# Patient Record
Sex: Male | Born: 1960
Health system: Southern US, Community
[De-identification: ages and names within clinical notes are randomized; demographics above are authoritative.]

## PROBLEM LIST (undated history)

## (undated) DIAGNOSIS — M1990 Unspecified osteoarthritis, unspecified site: Secondary | ICD-10-CM

## (undated) DIAGNOSIS — N182 Chronic kidney disease, stage 2 (mild): Secondary | ICD-10-CM

## (undated) DIAGNOSIS — R001 Bradycardia, unspecified: Secondary | ICD-10-CM

## (undated) DIAGNOSIS — J189 Pneumonia, unspecified organism: Secondary | ICD-10-CM

## (undated) DIAGNOSIS — F329 Major depressive disorder, single episode, unspecified: Secondary | ICD-10-CM

## (undated) DIAGNOSIS — E785 Hyperlipidemia, unspecified: Secondary | ICD-10-CM

## (undated) DIAGNOSIS — I10 Essential (primary) hypertension: Secondary | ICD-10-CM

## (undated) DIAGNOSIS — I255 Ischemic cardiomyopathy: Secondary | ICD-10-CM

## (undated) DIAGNOSIS — I513 Intracardiac thrombosis, not elsewhere classified: Secondary | ICD-10-CM

## (undated) DIAGNOSIS — F419 Anxiety disorder, unspecified: Secondary | ICD-10-CM

## (undated) DIAGNOSIS — F32A Depression, unspecified: Secondary | ICD-10-CM

## (undated) DIAGNOSIS — I2510 Atherosclerotic heart disease of native coronary artery without angina pectoris: Secondary | ICD-10-CM

## (undated) DIAGNOSIS — D735 Infarction of spleen: Secondary | ICD-10-CM

## (undated) DIAGNOSIS — G4733 Obstructive sleep apnea (adult) (pediatric): Secondary | ICD-10-CM

## (undated) DIAGNOSIS — I219 Acute myocardial infarction, unspecified: Secondary | ICD-10-CM

## (undated) HISTORY — DX: Essential (primary) hypertension: I10

## (undated) HISTORY — DX: Intracardiac thrombosis, not elsewhere classified: I51.3

## (undated) HISTORY — DX: Bradycardia, unspecified: R00.1

## (undated) HISTORY — DX: Obstructive sleep apnea (adult) (pediatric): G47.33

## (undated) HISTORY — DX: Chronic kidney disease, stage 2 (mild): N18.2

## (undated) HISTORY — PX: CORONARY ANGIOPLASTY WITH STENT PLACEMENT: SHX49

## (undated) HISTORY — DX: Hyperlipidemia, unspecified: E78.5

## (undated) HISTORY — DX: Ischemic cardiomyopathy: I25.5

## (undated) HISTORY — DX: Infarction of spleen: D73.5

## (undated) HISTORY — DX: Acute myocardial infarction, unspecified: I21.9

## (undated) HISTORY — PX: EXPLORATORY LAPAROTOMY: SUR591

## (undated) HISTORY — DX: Atherosclerotic heart disease of native coronary artery without angina pectoris: I25.10

---

## 1991-05-02 HISTORY — PX: ARTHROPLASTY: SHX135

## 2002-12-08 ENCOUNTER — Encounter: Admission: RE | Admit: 2002-12-08 | Discharge: 2002-12-08 | Payer: Self-pay | Admitting: Family Medicine

## 2002-12-08 ENCOUNTER — Encounter: Payer: Self-pay | Admitting: Family Medicine

## 2003-11-29 ENCOUNTER — Ambulatory Visit (HOSPITAL_COMMUNITY): Admission: RE | Admit: 2003-11-29 | Discharge: 2003-11-29 | Payer: Self-pay | Admitting: Family Medicine

## 2007-03-02 DIAGNOSIS — I219 Acute myocardial infarction, unspecified: Secondary | ICD-10-CM

## 2007-03-02 HISTORY — DX: Acute myocardial infarction, unspecified: I21.9

## 2007-03-25 ENCOUNTER — Ambulatory Visit: Payer: Self-pay | Admitting: Cardiology

## 2007-03-25 ENCOUNTER — Inpatient Hospital Stay (HOSPITAL_COMMUNITY): Admission: EM | Admit: 2007-03-25 | Discharge: 2007-03-29 | Payer: Self-pay | Admitting: Emergency Medicine

## 2007-04-01 ENCOUNTER — Ambulatory Visit: Payer: Self-pay | Admitting: Cardiology

## 2007-04-05 ENCOUNTER — Ambulatory Visit: Payer: Self-pay | Admitting: Cardiology

## 2007-04-05 LAB — CONVERTED CEMR LAB
BUN: 15 mg/dL (ref 6–23)
CO2: 32 meq/L (ref 19–32)
Calcium: 9.3 mg/dL (ref 8.4–10.5)
Chloride: 103 meq/L (ref 96–112)
Creatinine, Ser: 1.4 mg/dL (ref 0.4–1.5)
GFR calc Af Amer: 71 mL/min
GFR calc non Af Amer: 59 mL/min
Glucose, Bld: 108 mg/dL — ABNORMAL HIGH (ref 70–99)
Potassium: 4 meq/L (ref 3.5–5.1)
Sodium: 142 meq/L (ref 135–145)

## 2007-04-23 ENCOUNTER — Ambulatory Visit: Payer: Self-pay | Admitting: Cardiology

## 2007-04-23 ENCOUNTER — Ambulatory Visit: Payer: Self-pay

## 2007-04-23 LAB — CONVERTED CEMR LAB
BUN: 16 mg/dL (ref 6–23)
CO2: 32 meq/L (ref 19–32)
Calcium: 9.2 mg/dL (ref 8.4–10.5)
Chloride: 107 meq/L (ref 96–112)
Creatinine, Ser: 1.2 mg/dL (ref 0.4–1.5)
GFR calc Af Amer: 85 mL/min
GFR calc non Af Amer: 70 mL/min
Glucose, Bld: 74 mg/dL (ref 70–99)
Potassium: 3.8 meq/L (ref 3.5–5.1)
Sodium: 144 meq/L (ref 135–145)

## 2007-05-03 ENCOUNTER — Encounter: Payer: Self-pay | Admitting: Cardiology

## 2007-05-03 ENCOUNTER — Encounter (HOSPITAL_COMMUNITY): Admission: RE | Admit: 2007-05-03 | Discharge: 2007-06-29 | Payer: Self-pay | Admitting: Cardiology

## 2007-05-03 ENCOUNTER — Ambulatory Visit: Payer: Self-pay | Admitting: Cardiology

## 2007-05-03 ENCOUNTER — Ambulatory Visit: Payer: Self-pay

## 2007-05-03 LAB — CONVERTED CEMR LAB
ALT: 51 units/L (ref 0–53)
AST: 33 units/L (ref 0–37)
Albumin: 3.5 g/dL (ref 3.5–5.2)
Alkaline Phosphatase: 88 units/L (ref 39–117)
Bilirubin, Direct: 0.1 mg/dL (ref 0.0–0.3)
Cholesterol: 103 mg/dL (ref 0–200)
HDL: 30.1 mg/dL — ABNORMAL LOW (ref >39.0)
LDL Cholesterol: 58 mg/dL (ref 0–99)
Total Bilirubin: 0.5 mg/dL (ref 0.3–1.2)
Total CHOL/HDL Ratio: 3.4
Total Protein: 6.7 g/dL (ref 6.0–8.3)
Triglycerides: 73 mg/dL (ref 0–149)
VLDL: 15 mg/dL (ref 0–40)

## 2007-06-03 ENCOUNTER — Ambulatory Visit: Payer: Self-pay | Admitting: Cardiology

## 2007-06-03 LAB — CONVERTED CEMR LAB
BUN: 16 mg/dL (ref 6–23)
CO2: 33 meq/L — ABNORMAL HIGH (ref 19–32)
Calcium: 9.3 mg/dL (ref 8.4–10.5)
Chloride: 102 meq/L (ref 96–112)
Creatinine, Ser: 1.2 mg/dL (ref 0.4–1.5)
Direct LDL: 59.9 mg/dL
GFR calc Af Amer: 85 mL/min
GFR calc non Af Amer: 70 mL/min
Glucose, Bld: 87 mg/dL (ref 70–99)
Magnesium: 2.1 mg/dL (ref 1.5–2.5)
Potassium: 3.4 meq/L — ABNORMAL LOW (ref 3.5–5.1)
Sodium: 141 meq/L (ref 135–145)
Total CK: 232 units/L (ref 7–195)

## 2007-06-06 ENCOUNTER — Ambulatory Visit: Payer: Self-pay | Admitting: Cardiology

## 2007-06-20 ENCOUNTER — Ambulatory Visit: Payer: Self-pay | Admitting: Internal Medicine

## 2007-07-02 ENCOUNTER — Ambulatory Visit: Payer: Self-pay | Admitting: Cardiology

## 2007-07-02 LAB — CONVERTED CEMR LAB
BUN: 19 mg/dL (ref 6–23)
CO2: 30 meq/L (ref 19–32)
Calcium: 9.3 mg/dL (ref 8.4–10.5)
Chloride: 108 meq/L (ref 96–112)
Creatinine, Ser: 1.3 mg/dL (ref 0.4–1.5)
GFR calc Af Amer: 77 mL/min
GFR calc non Af Amer: 64 mL/min
Glucose, Bld: 75 mg/dL (ref 70–99)
Potassium: 4.3 meq/L (ref 3.5–5.1)
Sodium: 142 meq/L (ref 135–145)
Total CK: 220 units/L (ref 7–195)

## 2007-07-10 ENCOUNTER — Ambulatory Visit: Payer: Self-pay | Admitting: Cardiology

## 2007-07-19 ENCOUNTER — Ambulatory Visit (HOSPITAL_BASED_OUTPATIENT_CLINIC_OR_DEPARTMENT_OTHER): Admission: RE | Admit: 2007-07-19 | Discharge: 2007-07-19 | Payer: Self-pay | Admitting: Cardiology

## 2007-07-19 ENCOUNTER — Ambulatory Visit: Payer: Self-pay | Admitting: Cardiovascular Disease

## 2007-07-19 ENCOUNTER — Encounter: Payer: Self-pay | Admitting: Pulmonary Disease

## 2007-08-02 ENCOUNTER — Ambulatory Visit: Payer: Self-pay | Admitting: Cardiology

## 2007-08-02 LAB — CONVERTED CEMR LAB
ALT: 42 units/L (ref 0–53)
AST: 30 units/L (ref 0–37)
Albumin: 3.7 g/dL (ref 3.5–5.2)
Alkaline Phosphatase: 66 units/L (ref 39–117)
BUN: 19 mg/dL (ref 6–23)
Bilirubin, Direct: 0.1 mg/dL (ref 0.0–0.3)
CO2: 29 meq/L (ref 19–32)
Calcium: 9 mg/dL (ref 8.4–10.5)
Chloride: 109 meq/L (ref 96–112)
Cholesterol: 139 mg/dL (ref 0–200)
Creatinine, Ser: 1.3 mg/dL (ref 0.4–1.5)
GFR calc Af Amer: 77 mL/min
GFR calc non Af Amer: 64 mL/min
Glucose, Bld: 87 mg/dL (ref 70–99)
HDL: 33.2 mg/dL — ABNORMAL LOW (ref >39.0)
LDL Cholesterol: 94 mg/dL (ref 0–99)
Potassium: 3.8 meq/L (ref 3.5–5.1)
Sodium: 145 meq/L (ref 135–145)
Total Bilirubin: 0.7 mg/dL (ref 0.3–1.2)
Total CHOL/HDL Ratio: 4.2
Total Protein: 6.9 g/dL (ref 6.0–8.3)
Triglycerides: 59 mg/dL (ref 0–149)
VLDL: 12 mg/dL (ref 0–40)

## 2007-08-06 ENCOUNTER — Ambulatory Visit: Payer: Self-pay | Admitting: Pulmonary Disease

## 2007-08-19 DIAGNOSIS — I219 Acute myocardial infarction, unspecified: Secondary | ICD-10-CM | POA: Insufficient documentation

## 2007-08-19 DIAGNOSIS — I2510 Atherosclerotic heart disease of native coronary artery without angina pectoris: Secondary | ICD-10-CM | POA: Insufficient documentation

## 2007-08-19 DIAGNOSIS — E785 Hyperlipidemia, unspecified: Secondary | ICD-10-CM | POA: Insufficient documentation

## 2007-08-19 DIAGNOSIS — I10 Essential (primary) hypertension: Secondary | ICD-10-CM | POA: Insufficient documentation

## 2007-08-20 ENCOUNTER — Ambulatory Visit: Payer: Self-pay | Admitting: Pulmonary Disease

## 2007-08-20 DIAGNOSIS — J309 Allergic rhinitis, unspecified: Secondary | ICD-10-CM | POA: Insufficient documentation

## 2007-08-20 DIAGNOSIS — J45909 Unspecified asthma, uncomplicated: Secondary | ICD-10-CM | POA: Insufficient documentation

## 2007-08-20 DIAGNOSIS — F518 Other sleep disorders not due to a substance or known physiological condition: Secondary | ICD-10-CM | POA: Insufficient documentation

## 2007-08-20 DIAGNOSIS — G4733 Obstructive sleep apnea (adult) (pediatric): Secondary | ICD-10-CM | POA: Insufficient documentation

## 2007-09-24 ENCOUNTER — Ambulatory Visit: Payer: Self-pay | Admitting: Cardiology

## 2007-09-24 LAB — CONVERTED CEMR LAB
BUN: 20 mg/dL (ref 6–23)
CO2: 29 meq/L (ref 19–32)
Calcium: 9.2 mg/dL (ref 8.4–10.5)
Chloride: 109 meq/L (ref 96–112)
Creatinine, Ser: 1.1 mg/dL (ref 0.4–1.5)
GFR calc Af Amer: 94 mL/min
GFR calc non Af Amer: 77 mL/min
Glucose, Bld: 85 mg/dL (ref 70–99)
Magnesium: 2.3 mg/dL (ref 1.5–2.5)
Potassium: 3.7 meq/L (ref 3.5–5.1)
Sodium: 144 meq/L (ref 135–145)

## 2007-10-02 ENCOUNTER — Ambulatory Visit: Payer: Self-pay

## 2007-10-02 ENCOUNTER — Encounter: Payer: Self-pay | Admitting: Cardiology

## 2007-11-07 ENCOUNTER — Ambulatory Visit: Payer: Self-pay | Admitting: Cardiology

## 2007-12-25 ENCOUNTER — Ambulatory Visit: Payer: Self-pay | Admitting: Cardiology

## 2008-04-22 ENCOUNTER — Ambulatory Visit: Payer: Self-pay | Admitting: Cardiology

## 2008-05-01 HISTORY — PX: CARDIAC CATHETERIZATION: SHX172

## 2008-05-04 ENCOUNTER — Ambulatory Visit: Payer: Self-pay | Admitting: Cardiology

## 2008-05-04 LAB — CONVERTED CEMR LAB
BUN: 20 mg/dL (ref 6–23)
Basophils Absolute: 0 10*3/uL (ref 0.0–0.1)
Basophils Relative: 0 % (ref 0.0–3.0)
CO2: 32 meq/L (ref 19–32)
Calcium: 9.4 mg/dL (ref 8.4–10.5)
Chloride: 106 meq/L (ref 96–112)
Creatinine, Ser: 1.4 mg/dL (ref 0.4–1.5)
Eosinophils Absolute: 0.2 10*3/uL (ref 0.0–0.7)
Eosinophils Relative: 3 % (ref 0.0–5.0)
GFR calc Af Amer: 70 mL/min
GFR calc non Af Amer: 58 mL/min
Glucose, Bld: 82 mg/dL (ref 70–99)
HCT: 43.5 % (ref 39.0–52.0)
Hemoglobin: 14.3 g/dL (ref 13.0–17.0)
INR: 1 (ref 0.8–1.0)
Lymphocytes Relative: 24.9 % (ref 12.0–46.0)
MCHC: 32.9 g/dL (ref 30.0–36.0)
MCV: 73.6 fL — ABNORMAL LOW (ref 78.0–100.0)
Monocytes Absolute: 0.4 10*3/uL (ref 0.1–1.0)
Monocytes Relative: 7.7 % (ref 3.0–12.0)
Neutro Abs: 3.7 10*3/uL (ref 1.4–7.7)
Neutrophils Relative %: 64.4 % (ref 43.0–77.0)
Platelets: 189 10*3/uL (ref 150–400)
Potassium: 3.4 meq/L — ABNORMAL LOW (ref 3.5–5.1)
Prothrombin Time: 11.2 s (ref 10.9–13.3)
RBC: 5.92 M/uL — ABNORMAL HIGH (ref 4.22–5.81)
RDW: 14.1 % (ref 11.5–14.6)
Sodium: 143 meq/L (ref 135–145)
WBC: 5.7 10*3/uL (ref 4.5–10.5)

## 2008-05-07 ENCOUNTER — Ambulatory Visit: Payer: Self-pay | Admitting: Cardiology

## 2008-05-07 ENCOUNTER — Ambulatory Visit (HOSPITAL_COMMUNITY): Admission: RE | Admit: 2008-05-07 | Discharge: 2008-05-07 | Payer: Self-pay | Admitting: Cardiology

## 2008-07-22 ENCOUNTER — Encounter: Payer: Self-pay | Admitting: Cardiology

## 2008-07-22 ENCOUNTER — Ambulatory Visit: Payer: Self-pay | Admitting: Cardiology

## 2008-07-28 ENCOUNTER — Ambulatory Visit: Payer: Self-pay | Admitting: Cardiology

## 2008-08-06 LAB — CONVERTED CEMR LAB
BUN: 16 mg/dL (ref 6–23)
Basophils Absolute: 0 10*3/uL (ref 0.0–0.1)
Basophils Relative: 0.7 % (ref 0.0–3.0)
CO2: 33 meq/L — ABNORMAL HIGH (ref 19–32)
Calcium: 9.2 mg/dL (ref 8.4–10.5)
Chloride: 104 meq/L (ref 96–112)
Creatinine, Ser: 1.2 mg/dL (ref 0.4–1.5)
Eosinophils Absolute: 0.3 10*3/uL (ref 0.0–0.7)
Eosinophils Relative: 4.5 % (ref 0.0–5.0)
Fecal Occult Blood: NEGATIVE
Ferritin: 42.3 ng/mL (ref 22.0–322.0)
GFR calc non Af Amer: 84 mL/min (ref >60)
Glucose, Bld: 91 mg/dL (ref 70–99)
HCT: 47.3 % (ref 39.0–52.0)
Hemoglobin: 15.3 g/dL (ref 13.0–17.0)
Iron: 61 ug/dL (ref 42–165)
Lymphocytes Relative: 32.4 % (ref 12.0–46.0)
Lymphs Abs: 1.9 10*3/uL (ref 0.7–4.0)
MCHC: 32.4 g/dL (ref 30.0–36.0)
MCV: 74.9 fL — ABNORMAL LOW (ref 78.0–100.0)
Monocytes Absolute: 0.5 10*3/uL (ref 0.1–1.0)
Monocytes Relative: 8.7 % (ref 3.0–12.0)
Neutro Abs: 3.2 10*3/uL (ref 1.4–7.7)
Neutrophils Relative %: 53.7 % (ref 43.0–77.0)
OCCULT 1: NEGATIVE
OCCULT 2: NEGATIVE
OCCULT 3: NEGATIVE
OCCULT 4: NEGATIVE
OCCULT 5: NEGATIVE
Platelets: 177 10*3/uL (ref 150.0–400.0)
Potassium: 3.4 meq/L — ABNORMAL LOW (ref 3.5–5.1)
RBC: 6.31 M/uL — ABNORMAL HIGH (ref 4.22–5.81)
RDW: 13.6 % (ref 11.5–14.6)
Saturation Ratios: 14.7 % — ABNORMAL LOW (ref 20.0–50.0)
Sodium: 144 meq/L (ref 135–145)
Transferrin: 295.9 mg/dL (ref 212.0–360.0)
WBC: 5.9 10*3/uL (ref 4.5–10.5)

## 2008-08-19 ENCOUNTER — Telehealth: Payer: Self-pay | Admitting: Cardiology

## 2008-09-17 ENCOUNTER — Ambulatory Visit: Payer: Self-pay | Admitting: Cardiology

## 2008-09-23 ENCOUNTER — Encounter: Payer: Self-pay | Admitting: Cardiology

## 2008-09-24 ENCOUNTER — Encounter: Admission: RE | Admit: 2008-09-24 | Discharge: 2008-09-24 | Payer: Self-pay | Admitting: Family Medicine

## 2009-03-10 ENCOUNTER — Encounter (INDEPENDENT_AMBULATORY_CARE_PROVIDER_SITE_OTHER): Payer: Self-pay | Admitting: *Deleted

## 2009-03-17 ENCOUNTER — Ambulatory Visit: Payer: Self-pay | Admitting: Cardiology

## 2009-04-02 ENCOUNTER — Encounter: Payer: Self-pay | Admitting: Cardiology

## 2009-06-16 IMAGING — CR DG CHEST 2V
2 series · 2 of 2 positions shown · non-contrast
Comparison: none

CLINICAL DATA: Myocardial infarction.
 CHEST - 2 VIEW:

[w chest pa]
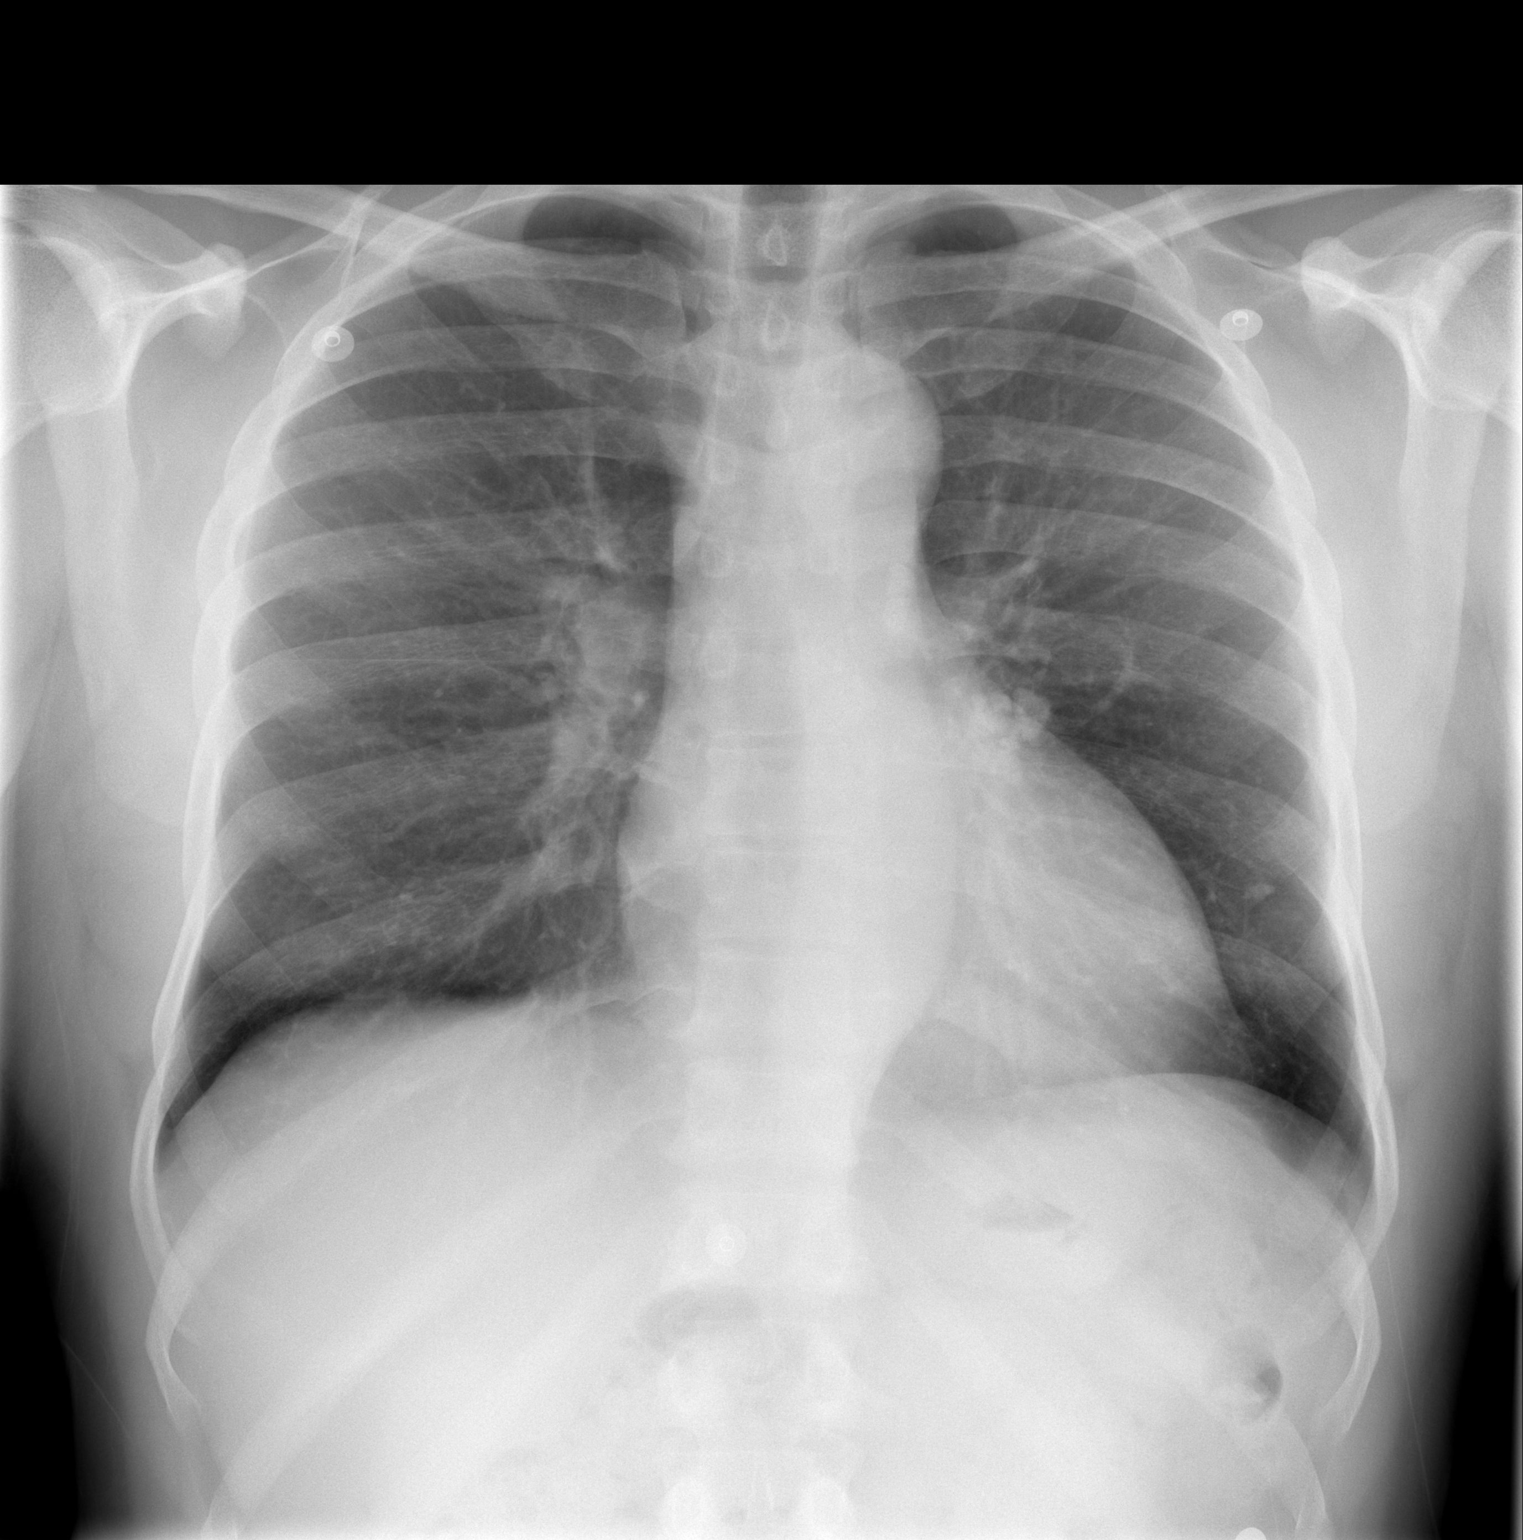

[w chest lat]
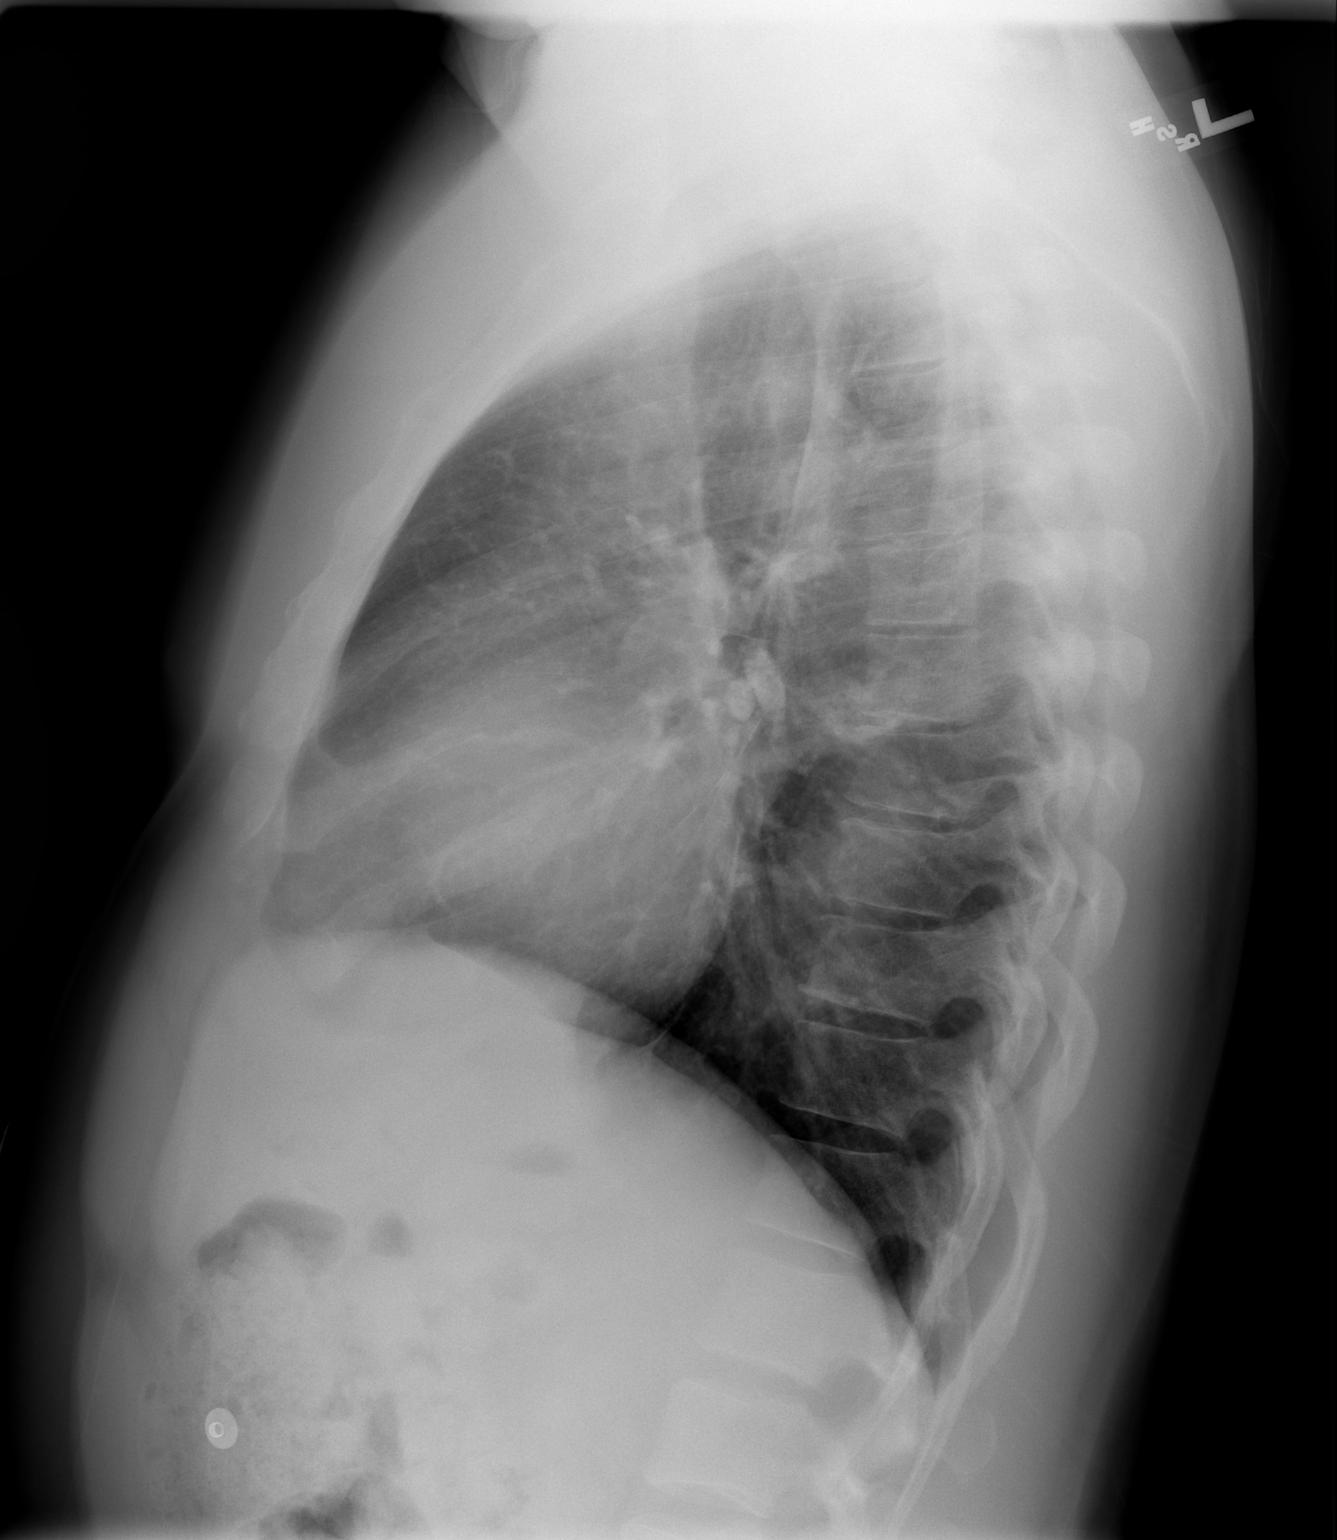

[2 of 2 positions shown; findings below may reference images not displayed]

FINDINGS: Heart size and vascularity are normal. There is slight tortuosity of the thoracic aorta.  There is a calcified granuloma in the left lower lobe with some calcified granulomas in the left hilum as well.  No bony abnormality.
 No infiltrates or effusions.
IMPRESSION: No acute disease.

## 2009-07-22 ENCOUNTER — Ambulatory Visit: Payer: Self-pay | Admitting: Cardiology

## 2009-10-21 ENCOUNTER — Encounter: Payer: Self-pay | Admitting: Cardiology

## 2009-10-22 ENCOUNTER — Ambulatory Visit: Payer: Self-pay | Admitting: Cardiology

## 2009-11-15 ENCOUNTER — Telehealth: Payer: Self-pay | Admitting: Cardiology

## 2010-01-25 ENCOUNTER — Ambulatory Visit: Payer: Self-pay | Admitting: Cardiology

## 2010-02-15 ENCOUNTER — Telehealth: Payer: Self-pay | Admitting: Pulmonary Disease

## 2010-02-17 ENCOUNTER — Telehealth: Payer: Self-pay | Admitting: Cardiology

## 2010-02-21 ENCOUNTER — Encounter: Payer: Self-pay | Admitting: Cardiology

## 2010-03-07 ENCOUNTER — Ambulatory Visit: Payer: Self-pay | Admitting: Cardiology

## 2010-03-10 ENCOUNTER — Ambulatory Visit: Payer: Self-pay | Admitting: Pulmonary Disease

## 2010-03-10 DIAGNOSIS — G4726 Circadian rhythm sleep disorder, shift work type: Secondary | ICD-10-CM | POA: Insufficient documentation

## 2010-03-16 ENCOUNTER — Telehealth: Payer: Self-pay | Admitting: Cardiology

## 2010-03-23 ENCOUNTER — Encounter: Payer: Self-pay | Admitting: Cardiology

## 2010-04-14 ENCOUNTER — Ambulatory Visit: Payer: Self-pay | Admitting: Pulmonary Disease

## 2010-04-21 ENCOUNTER — Ambulatory Visit: Payer: Self-pay | Admitting: Cardiology

## 2010-05-09 ENCOUNTER — Encounter: Payer: Self-pay | Admitting: Cardiology

## 2010-05-31 NOTE — Assessment & Plan Note (Signed)
Summary: 2 month rov   Visit Type:  2 months follow up Referring Provider:  Shawnie Pons Primary Provider:  Lupe Carney  CC:  Pt. is not sure if he takes Lopressor.  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  Averages about 6 or 7 hours per day.   So he has some broken sleep.  Long discussion today about exercise.  Still trying to lose weight.  And has dropped some.  Wife thinks he is OK, but thinks he needs to sleep more.   Current Medications (verified): 1)  Amlodipine Besylate 5 Mg  Tabs (Amlodipine Besylate) .... Take 1 Tablet By Mouth Once A Day 2)  Klor-Con 10 10 Meq  Tbcr (Potassium Chloride) .... Take 1 Tablet By Mouth Once A Day 3)  Pravastatin Sodium 40 Mg  Tabs (Pravastatin Sodium) .... Take 1 Tablet By Mouth Once A Day 4)  Nitroquick 0.4 Mg  Subl (Nitroglycerin) .... As Needed 5)  Tekturna 150 Mg Tabs (Aliskiren Fumarate) .... Take 1 Tablet By Mouth Once A Day 6)  Aspirin 81 Mg Tbec (Aspirin) .... Take One Tablet By Mouth Daily  Allergies (verified): No Known Drug Allergies  Vital Signs:  Patient profile:   50 year old male Height:      69 inches Weight:      206.75 pounds BMI:     30.64 Pulse rate:   64 / minute Pulse rhythm:   regular Resp:     18 per minute BP sitting:   120 / 80  (left arm) Cuff size:   large  Vitals Entered By: Vikki Ports (Sep 17, 2008 4:34 PM) 1  Physical Exam  General:  Well developed, well nourished, in no acute distress. Lungs:  Clear bilaterally to auscultation and percussion. Heart:  Normal S1 and S2.  S4 gallop. Abdomen:  Soft with hepatosplenomegaly Extremities:  No clubbing or cyanosis.   Impression & Recommendations:  Problem # 1:  CORONARY HEART DISEASE (ICD-414.00) Discussed medications, and treatment.  Can reduce aspirin to 81 mg per day.  No current angina The following medications were removed from the medication list:    Bayer Aspirin 325 Mg Tabs (Aspirin)  .Marland Kitchen... Take 1 tablet by mouth once a day    Plavix 75 Mg Tabs (Clopidogrel bisulfate) .Marland Kitchen... Take 1 tablet by mouth once a day    Lopressor 50 Mg Tabs (Metoprolol tartrate) .Marland Kitchen... Take 1 tablet by mouth two times a day    Metoprolol Tartrate 50 Mg Tabs (Metoprolol tartrate) His updated medication list for this problem includes:    Amlodipine Besylate 5 Mg Tabs (Amlodipine besylate) .Marland Kitchen... Take 1 tablet by mouth once a day    Nitroquick 0.4 Mg Subl (Nitroglycerin) .Marland Kitchen... As needed    Aspirin 81 Mg Tbec (Aspirin) .Marland Kitchen... Take one tablet by mouth daily  Problem # 2:  INADEQUATE SLEEP HYGIENE (ICD-307.49) Discussed sleep habits and need for more sleep.  Problem # 3:  HYPERLIPIDEMIA (ICD-272.4) Needs followup with Lupe Carney and labs done with him His updated medication list for this problem includes:    Pravastatin Sodium 40 Mg Tabs (Pravastatin sodium) .Marland Kitchen... Take 1 tablet by mouth once a day  Problem # 4:  HYPERTENSION (ICD-401.9) Record pressures and take into Dr. Clovis Riley at followup office visit for [place] of medications. The following medications were removed from the medication list:    Bayer Aspirin 325 Mg Tabs (Aspirin) .Marland Kitchen... Take 1 tablet by mouth once a day  Diovan Hct 160-12.5 Mg Tabs (Valsartan-hydrochlorothiazide) .Marland Kitchen... Take 1 tablet by mouth two times a day    Lopressor 50 Mg Tabs (Metoprolol tartrate) .Marland Kitchen... Take 1 tablet by mouth two times a day    Metoprolol Tartrate 50 Mg Tabs (Metoprolol tartrate) His updated medication list for this problem includes:    Amlodipine Besylate 5 Mg Tabs (Amlodipine besylate) .Marland Kitchen... Take 1 tablet by mouth once a day    Tekturna 150 Mg Tabs (Aliskiren fumarate) .Marland Kitchen... Take 1 tablet by mouth once a day    Aspirin 81 Mg Tbec (Aspirin) .Marland Kitchen... Take one tablet by mouth daily  Patient Instructions: 1)  Your physician recommends that you continue on your current medications as directed. Please refer to the Current Medication list given to you today. 2)   Your physician wants you to follow-up in:   6 months. You will receive a reminder letter in the mail two months in advance. If you don't receive a letter, please call our office to schedule the follow-up appointment.

## 2010-05-31 NOTE — Assessment & Plan Note (Addendum)
Summary: acute sick visit for osa, circadian rhythm issue.   Copy to:  Shawnie Pons Primary Provider/Referring Provider:  Lupe Carney  CC:  pt last seen 07/2007. Pt states he works at night so when he sleepsin the day he is not able to sleep. pt states he feels sleepy and tired when he is at work. .  History of Present Illness: The pt comes in today for re-evaluation of multiple sleeping issues.  He was last seen in 2009, where he was found to have mild osa, but a more signficant issue for him was inadequate sleep hygiene and his work schedule.  At the time, he was trying to work 2 jobs, one during the night and one during the day.  I had asked him to choose, and work on sleep hygiene with a committment to better sleep.  I did not feel his mild osa was his primary issue at the time.  He has not been seen since, but has discontinued his job requiring daytime hours.  He continues to have poor sleep during the day, and is sleepy while at work.  Despite this, he has no issues with his job function.  Currently, goes to bed at 10am, and has to get up at 2pm to pick up his grandkids at school at 3pm.  He will typically go back to bed at 5pm, but his wife comes home from work and he can't sleep.  He will therefore get to bed at 7pm, and needs to get up at 10pm to get to work.  He often oversleeps.  He has room darkening shades, and tries to turn off electronics.  He has also tried earplugs.  His grown children will typically come in and out of the house during the day while he is sleeping, and the phone will ring often.  His wife feels his snoring is a little worse, but the pt's weight has actually decreased since the last visit.  The pt had wanted to avoid cpap in 2009, and work exclusively on weight loss.  Current Medications (verified): 1)  Amlodipine Besylate 10 Mg Tabs (Amlodipine Besylate) .... Take One Tablet By Mouth Daily 2)  Nitrostat 0.4 Mg Subl (Nitroglycerin) .Marland Kitchen.. 1 Tablet Under Tongue At Onset  of Chest Pain; You May Repeat Every 5 Minutes For Up To 3 Doses. 3)  Tekturna 300 Mg Tabs (Aliskiren Fumarate) .... Take 1 Tablet By Mouth Once A Day 4)  Aspirin 81 Mg Tbec (Aspirin) .... Take One Tablet By Mouth Daily 5)  Citalopram Hydrobromide 20 Mg Tabs (Citalopram Hydrobromide) .Marland Kitchen.. 1 Tab Once Daily 6)  Etodolac 400 Mg Tabs (Etodolac) .Marland Kitchen.. 1 Tab Two Times A Day As Needed 7)  Pravastatin Sodium 80 Mg Tabs (Pravastatin Sodium) .... Take One Tablet By Mouth Daily At Bedtime  Allergies (verified): No Known Drug Allergies  Past History:  Past medical, surgical, family and social histories (including risk factors) reviewed, and no changes noted (except as noted below).  Past Medical History: h/o mild osa ASTHMA (ICD-493.90) ALLERGIC RHINITIS (ICD-477.9) Hx of MYOCARDIAL INFARCTION (ICD-410.90) HYPERTENSION (ICD-401.9) HYPERLIPIDEMIA (ICD-272.4) CORONARY HEART DISEASE (ICD-414.00)-status post percutaneous intervention in November of 08    Past Surgical History: Reviewed history from 04/30/2008 and no changes required. Thumb surgery Exp lap for hernia  Family History: Reviewed history from 07/16/2009 and no changes required.   His mother had irregular heartbeats and atrial   fibrillation.  His father's medical history is unknown.      Social History: Reviewed history from  07/16/2009 and no changes required.  He lives in [place] with his wife.  He works the   third shift at the [employer].  He does have a child.  He is no   longer smoking.      Review of Systems       The patient complains of shortness of breath with activity, non-productive cough, acid heartburn, difficulty swallowing, and nasal congestion/difficulty breathing through nose.  The patient denies productive cough, coughing up blood, chest pain, irregular heartbeats, indigestion, loss of appetite, weight change, abdominal pain, sore throat, tooth/dental problems, headaches, sneezing, itching, ear ache,  anxiety, depression, hand/feet swelling, joint stiffness or pain, rash, change in color of mucus, and fever.    Vital Signs:  Patient profile:   50 year old male Height:      69 inches Weight:      213.13 pounds BMI:     31.59 O2 Sat:      99 % on Room air Temp:     98.3 degrees F oral Pulse rate:   63 / minute Cuff size:   large  Vitals Entered By: Carver Fila (March 10, 2010 9:51 AM)  O2 Flow:  Room air CC: pt last seen 07/2007. Pt states he works at night so when he sleepsin the day he is not able to sleep. pt states he feels sleepy and tired when he is at work.  Comments meds and allergies updated Phone number updated Carver Fila  March 10, 2010 9:53 AM    Physical Exam  General:  ow male in nad Nose:  no discharge or purulence noted. Lungs:  clear to auscultation Extremities:  no edema or cyanosis  Neurologic:  awake, but does appear sleepy moves all 4.   Impression & Recommendations:  Problem # 1:  OBSTRUCTIVE SLEEP APNEA (ICD-327.23) the pt has a h/o mild osa from 2009, and it is unclear if this has anything to do with his sleep problem.  It certainly is not a CV issue for him.  We can treat the pt's osa with cpap as a trial, but it may be the device actually makes his sleep worse since it is so mild??  Will go ahead and set up on auto device for 4 weeks to see how he does.  Problem # 2:  INADEQUATE SLEEP HYGIENE (ICD-307.49) A lot of the patient's sleep issues during the day are related to his lifestyle.  I have recommended keeping his grown children from coming in and out of the house during the day, turning off the phones, and arranging alternative transportation for his grandchildren to get to their home from school.  That way he could sleep until 5pm each day without interruptions.    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  Some of his problem with sleeping during the day  is related to his sleep hygiene/environmental issues, but I suspect some is also intrinsic to physiologic makeup.  How much each contributes is really unknown.  I would like to avoid any sedative hypnotics to help him get to sleep, especially with him getting up frequently during the day to do other things.  If his work performance is being adversely affected, could try stimulant medication such as nuvigil.  He does not feel this is the case.  The only way to take this component out of his sleep issues is to change to first or second shift.  Other Orders: Est. Patient Level IV (82956) DME Referral (DME)  Patient Instructions: 1)  will try cpap on auto mode for the next 4-5 weeks. Please call if tolerance issues. 2)  think about the issues we have discussed.  Ultimately, I think you are going to need a lifestyle or job change to correct this. 3)  followup with me in 5 weeks.   Immunization History:  Influenza Immunization History:    Influenza:  historical (03/31/2009)

## 2010-05-31 NOTE — Progress Notes (Signed)
Summary: Letter for work  Phone Note Call from Patient   Caller: Spouse valerie [phone] Reason for Call: Talk to Nurse Summary of Call: pt's wife valerie calling re letter written for him, now has to be reworded-pls call (knows it will be tomorrow) Initial call taken by: Glynda Jaeger,  March 16, 2010 9:08 AM  Follow-up for Phone Call        He would be best served by working a schedule that can be  adjusted to accomodate his sleep pattern. The pt would like to pick-up next Wednesday after Dr Riley Kill signs note.  Follow-up by: Julieta Gutting, RN, BSN,  March 17, 2010 5:47 PM  Additional Follow-up for Phone Call Additional follow up Details #1::        Note completed and placed at the front desk for pick-up. Additional Follow-up by: Julieta Gutting, RN, BSN,  March 23, 2010 10:48 AM

## 2010-05-31 NOTE — Progress Notes (Signed)
Summary: nos appt  Phone Note Call from Patient   Caller: [email or web address]  Call For: clance Summary of Call: LMTCB x2 to rsc nos from 10/17. Initial call taken by: Darletta Moll,  February 15, 2010 4:04 PM

## 2010-05-31 NOTE — Progress Notes (Signed)
Summary: letter for work  Phone Note Call from Patient   Caller: Spouse [phone] valerie or [phone] Reason for Call: Talk to Nurse Summary of Call: re letter for job-to work days-due to sleep apnea-is letter ready? Initial call taken by: Glynda Jaeger,  February 17, 2010 9:09 AM  Follow-up for Phone Call        Letter completed by Dr Riley Kill.  Left message that letter was placed at the front desk for pick-up. Follow-up by: Julieta Gutting, RN, BSN,  February 21, 2010 6:26 PM

## 2010-05-31 NOTE — Letter (Signed)
Summary: FMLA Papers  FMLA Papers   Imported By: Kassie Mends 09/13/2009 09:13:07  _____________________________________________________________________  External Attachment:    Type:   Image     Comment:   External Document

## 2010-05-31 NOTE — Assessment & Plan Note (Signed)
Summary: Austin Ingram   Visit Type:  Follow-up Referring Provider:  Shawnie Pons Primary Provider:  Lupe Carney   History of Present Illness: Patient does not sleep well.  He is missing alot of work related to that.  His body will not let him.  No chest pain at present.  No exertinal symptoms.  His sleep issues are complicated, related to his work schedule.   Current Medications (verified): 1)  Amlodipine Besylate 10 Mg Tabs (Amlodipine Besylate) .... Take One Tablet By Mouth Daily 2)  Nitrostat 0.4 Mg Subl (Nitroglycerin) .Marland Kitchen.. 1 Tablet Under Tongue At Onset of Chest Pain; You May Repeat Every 5 Minutes For Up To 3 Doses. 3)  Tekturna 300 Mg Tabs (Aliskiren Fumarate) .... Take 1 Tablet By Mouth Once A Day 4)  Aspirin 81 Mg Tbec (Aspirin) .... Take One Tablet By Mouth Daily 5)  Citalopram Hydrobromide 20 Mg Tabs (Citalopram Hydrobromide) .Marland Kitchen.. 1 Tab Once Daily 6)  Etodolac 400 Mg Tabs (Etodolac) .Marland Kitchen.. 1 Tab Two Times A Day As Needed 7)  Pravastatin Sodium 40 Mg Tabs (Pravastatin Sodium) .... Take One Tablet By Mouth Daily At Bedtime  Allergies (verified): No Known Drug Allergies  Vital Signs:  Patient profile:   50 year old male Height:      69 inches Weight:      213 pounds BMI:     31.57 Pulse rate:   51 / minute BP sitting:   150 / 88  (left arm)  Vitals Entered By: Laurance Flatten CMA (January 25, 2010 9:30 AM)  Physical Exam  General:  Well developed, well nourished, in no acute distress. Head:  normocephalic and atraumatic Eyes:  PERRLA/EOM intact; conjunctiva and lids normal. Lungs:  Clear bilaterally to auscultation and percussion. Heart:  PMI non displaced.  Normal S1 and S2.  Pulses:  pulses normal in all 4 extremities Extremities:  No clubbing or cyanosis. Neurologic:  Alert and oriented x 3.   EKG  Procedure date:  01/25/2010  Findings:      NSR.  Anterior MI, old.  Inferior MI, old.  Lateral T inversion.   Impression & Recommendations:  Problem # 1:   OBSTRUCTIVE SLEEP APNEA (ICD-327.23) Some obstruction, and some related to hygiene, with night work.  Needs to work days.  Will support with letter.    Problem # 2:  HYPERTENSION (ICD-401.9) Not well controlled in part because of sleep issues.  Will be difficult to control.   His updated medication list for this problem includes:    Amlodipine Besylate 10 Mg Tabs (Amlodipine besylate) .Marland Kitchen... Take one tablet by mouth daily    Tekturna 300 Mg Tabs (Aliskiren fumarate) .Marland Kitchen... Take 1 tablet by mouth once a day    Aspirin 81 Mg Tbec (Aspirin) .Marland Kitchen... Take one tablet by mouth daily  Problem # 3:  CORONARY HEART DISEASE (ICD-414.00) No new symptoms.  Will continue medical therapy, but addressing sleep issues of primary importance. His updated medication list for this problem includes:    Amlodipine Besylate 10 Mg Tabs (Amlodipine besylate) .Marland Kitchen... Take one tablet by mouth daily    Nitrostat 0.4 Mg Subl (Nitroglycerin) .Marland Kitchen... 1 tablet under tongue at onset of chest pain; you may repeat every 5 minutes for up to 3 doses.    Aspirin 81 Mg Tbec (Aspirin) .Marland Kitchen... Take one tablet by mouth daily  Problem # 4:  HYPERLIPIDEMIA (ICD-272.4) Current situation is not completely controlled.  Would favor increasing to 80mg  and recheck.  His updated medication list for this  problem includes:    Pravastatin Sodium 80 Mg Tabs (Pravastatin sodium) .Marland Kitchen... Take one tablet by mouth daily at bedtime  Other Orders: EKG w/ Interpretation (93000)  Patient Instructions: 1)  Your physician recommends that you schedule a follow-up appointment in: 3 MONTHS with Dr Riley Kill 2)  You have been referred to Dr Shelle Iron (Pulmonary) for follow-up of sleep study.  3)  Your physician recommends that you return for a FASTING Lipid and Liver Profile in 6 WEEKS (401.9, 414.00, 272.4) 4)  Your physician has recommended you make the following change in your medication: INCREASE Pravastatin to 80mg  once a day Prescriptions: PRAVASTATIN SODIUM 80 MG  TABS (PRAVASTATIN SODIUM) Take one tablet by mouth daily at bedtime  #30 x 6   Entered by:   Julieta Gutting, RN, BSN   Authorized by:   Ronaldo Miyamoto, MD, [facility]   Signed by:   Julieta Gutting, RN, BSN on 01/25/2010   Method used:   Electronically to        [facility] [address] [phone]* (retail)       [address]       [place], [place]  [address]       Ph: [phone] or [phone]       Fax: [phone]   RxID:   [id number]

## 2010-05-31 NOTE — Letter (Signed)
Summary: Generic Letter  Architectural technologist, Main Office  [address]   [place], [place] [address]   Phone: [phone]  Fax: [phone]        July 22, 2009 MRN: [id number]    Austin Ingram [address] [place], [place]  [address]    To whom it may concern:   Mr. Tritz is a patient under my care. He has had treatment for Myocardial Infarction. It would be my impression that he would be better served working daytime hours, and not night-time shifts based on his underlying medical history. Thank you in advance for any consideration you might give.   Sincerely,     Herby Abraham, MD, [facility]

## 2010-05-31 NOTE — Miscellaneous (Signed)
Summary: chart update        Cardiac Cath  Procedure date:  05/08/2008  Findings:       ANGIOGRAPHIC DATA:   1. The right coronary artery demonstrates diffuse plaquing throughout       the mid and distal vessel.  This would be in the range of about 40-       50% but is evident.  The right terminates as a posterior       descending, small posterolateral system and then tapers in caliber       distally.   2. The left main is free of critical disease.   3. The LAD at the previous site of stenting is widely patent with less       than 30-40% narrowing at the stent site.  This appears to be well-       healed.  There was no significant compromise of flow.  There is a       diagonal branch that comes off just distal to the stent without       significant narrowing.  There is a ramus intermedius with mild       diffuse luminal irregularity.   4. There is a circumflex that has about 40-50% narrowing in its       proximal segment which is segmental.  The distal vessel is large in       caliber.  The AV circumflex demonstrates about a 50-70% eccentric       plaque, not substantially changed from the previous study supplying       a fairly large posterolateral branch.      Ventriculography was not performed because of mild elevation in   creatinine.      CONCLUSION:   1. Continued patency of the LAD at the prior stent site.   2. Diffuse luminal irregularities of the coronaries with abnormalities       in the first diagonal, the first marginal, the AV circumflex and       diffuse plaquing of the mid right coronary.      RECOMMENDATIONS:  At the present time, we will recommend continued   medical therapy.  I will review this with the patient in the office in   detail.  I plan to review the films with my colleagues as well.               Arturo Morton. Riley Kill, MD, [facility]    CXR  Procedure date:  05/07/2008  Findings:       Clinical Data: Chest pain.  Pre cardiac catheterization  evaluation.    CHEST - 2 VIEW    Comparison: 03/29/2007.    Findings: The heart remains normal in size and the aorta remains   mildly tortuous.  Stable calcified granuloma in the left lower lung   zone and normal sized calcified left hilar and AP window lymph   nodes.  Clear lungs.  Mild thoracic spine degenerative changes and   minimal scoliosis.    IMPRESSION:   No acute abnormality.    Read By:  Darrol Angel,  M.D.   Released By:  Darrol Angel,  M.D.

## 2010-05-31 NOTE — Progress Notes (Signed)
Summary: c/o joint pain   Phone Note Call from Patient Call back at Home Phone [phone] Call back at [phone]   Caller: Spouse Reason for Call: Talk to Nurse Summary of Call: per pt wife calling. pt still having joint pain.  Initial call taken by: Lorne Skeens,  November 15, 2009 11:32 AM  Follow-up for Phone Call        Left message to call back Meredith Staggers, RN  November 15, 2009 11:51 AM   pt w/no relief in joint pain since stopping pravastatin, will send mess to Dr Mindi Slicker, RN  November 15, 2009 11:56 AM   Additional Follow-up for Phone Call Additional follow up Details #1::        RN s/w Pt's wife re: being advised by Dr Riley Kill if joint pain continued since stopping Pravastatin to call office. Joint pain has continued. Pt's wife concerned that he should be on some medicine. RN will forward to Dr Riley Kill. Pt's wife verbalizes understanding. Bernita Raisin, RN, BSN  November 19, 2009 12:39 PM  Left message for pt's wife to call back. Julieta Gutting, RN, BSN  November 23, 2009 3:58 PM    Additional Follow-up for Phone Call Additional follow up Details #2::    I spoke with the pt's wife and made her aware that Dr Riley Kill would like the pt to restart his Pravastatin and arrange an OV with PCP to discuss joint pain.  Pt's wife agrees with plan.  Follow-up by: Julieta Gutting, RN, BSN,  November 25, 2009 12:37 PM  New/Updated Medications: PRAVASTATIN SODIUM 40 MG TABS (PRAVASTATIN SODIUM) Take one tablet by mouth daily at bedtime

## 2010-05-31 NOTE — Assessment & Plan Note (Signed)
Summary: f15m   Visit Type:  3 mo f/u Referring Provider:  Shawnie Pons Primary Provider:  Lupe Carney  CC:  chest pain....sob w/exertion....no other complaints today.  History of Present Illness: Patient experiences some intermittent chest pain, but it is not easy to predict.  He has not lost much weight, and the issue of OSA remains.  Currently off of pravastatin.  Has some shortness of breath with exertion.  Options reviewed, and case discussed at length with patient and wife, who is attendance.  Issues of continued habits discussed.    Current Medications (verified): 1)  Amlodipine Besylate 10 Mg Tabs (Amlodipine Besylate) .... Take One Tablet By Mouth Daily 2)  Nitrostat 0.4 Mg Subl (Nitroglycerin) .Marland Kitchen.. 1 Tablet Under Tongue At Onset of Chest Pain; You May Repeat Every 5 Minutes For Up To 3 Doses. 3)  Tekturna 300 Mg Tabs (Aliskiren Fumarate) .... Take 1 Tablet By Mouth Once A Day 4)  Aspirin 81 Mg Tbec (Aspirin) .... Take One Tablet By Mouth Daily 5)  Citalopram Hydrobromide 20 Mg Tabs (Citalopram Hydrobromide) .Marland Kitchen.. 1 Tab Once Daily 6)  Etodolac 400 Mg Tabs (Etodolac) .Marland Kitchen.. 1 Tab Two Times A Day As Needed  Allergies (verified): No Known Drug Allergies  Past History:  Past Medical History: Last updated: 08/20/2007 Current Problems:  ASTHMA (ICD-493.90) ALLERGIC RHINITIS (ICD-477.9) Hx of MYOCARDIAL INFARCTION (ICD-410.90) HYPERTENSION (ICD-401.9) HYPERLIPIDEMIA (ICD-272.4) CORONARY HEART DISEASE (ICD-414.00)-status post percutaneous intervention in November of 08    Past Surgical History: Last updated: 04/30/2008 Thumb surgery Exp lap for hernia  Family History: Last updated: 07/16/2009   His mother had irregular heartbeats and atrial   fibrillation.  His father's medical history is unknown.      Social History: Last updated: 07/16/2009  He lives in [place] with his wife.  He works the   third shift at the [employer].  He does have a child.  He is no   longer smoking.      Vital Signs:  Patient profile:   50 year old male Height:      69 inches Weight:      213 pounds BMI:     31.57 Pulse rate:   50 / minute Pulse rhythm:   irregular BP sitting:   136 / 70  (left arm) Cuff size:   large  Vitals Entered By: Danielle Rankin, CMA (October 22, 2009 4:19 PM)  Physical Exam  General:  Well developed, well nourished, in no acute distress. Head:  normocephalic and atraumatic Eyes:  PERRLA/EOM intact; conjunctiva and lids normal. Lungs:  Clear bilaterally to auscultation and percussion. Heart:  Non-displaced PMI, chest non-tender; regular rate and rhythm, S1, S2 without murmurs, rubs or gallops. Carotid upstroke normal, no bruit. Pulses:  pulses normal in all 4 extremities Extremities:  No clubbing or cyanosis. Neurologic:  Alert and oriented x 3.   EKG  Procedure date:  10/22/2009  Findings:      NSR.  Anteroseptal MI, indeterminate age, known however to be present on prior tracings.     Impression & Recommendations:  Problem # 1:  CORONARY HEART DISEASE (ICD-414.00) No real change in status.  No real change in issue of OSA, fatigue.  May need repeat catheterization study eventually, but a number of issues should be addressed, as we discussed in detail today.  His updated medication list for this problem includes:    Amlodipine Besylate 10 Mg Tabs (Amlodipine besylate) .Marland Kitchen... Take one tablet by mouth daily    Nitrostat 0.4  Mg Subl (Nitroglycerin) .Marland Kitchen... 1 tablet under tongue at onset of chest pain; you may repeat every 5 minutes for up to 3 doses.    Aspirin 81 Mg Tbec (Aspirin) .Marland Kitchen... Take one tablet by mouth daily  Orders: EKG w/ Interpretation (93000)  Problem # 2:  OBSTRUCTIVE SLEEP APNEA (ICD-327.23) Not significant weight loss since last visit.    Problem # 3:  HYPERLIPIDEMIA (ICD-272.4) Followed by Dr. Clovis Riley, who is addressing.   The following medications were removed from the medication list:    Pravastatin Sodium 40  Mg Tabs (Pravastatin sodium) .Marland Kitchen... Take 1 tablet by mouth once a day  Orders: EKG w/ Interpretation (93000)  Patient Instructions: 1)  Your physician recommends that you schedule a follow-up appointment in: 3 MONTHS 2)  Your physician recommends that you continue on your current medications as directed. Please refer to the Current Medication list given to you today. 3)  Call the office in 3 WEEKS to let us know how your joints are feeling off of Pravastatin.

## 2010-05-31 NOTE — Assessment & Plan Note (Signed)
Summary: consult for osa   Referred by:  Shawnie Pons PCP:  Lupe Carney  Chief Complaint:  Sleep Consult.  History of Present Illness: the patient is a 50 year old gentleman who I've been asked to see for obstructive sleep apnea.  The patient recently underwent a split-night study, where during the first part of the night he was found to have an apnea hypopnea index of 13 events/hr.  He was then placed on CPAP and titrated to a final pressure of 12 cm of water.  The patient states that he has been noted to have loud snoring, but no one has ever mentioned pulses in his breathing during sleep.  He has had rare choking arousals.  The patient typically works from 11:30 p.m. until 8 a.m., but will also work during the day from 9 a.m. to 5 p.m. on occasions.  He typically will sleep only 3 hours at a time because of his work.  Obviously, the patient does not feel rested upon arising, and has significant sleep pressure with periods of inactivity.  He also has occasional sleep pressure with driving on rare occasions.  Of note, the patient's weight is up 5 pounds over the last two years.     Current Allergies: No known allergies   Past Medical History:    Current Problems:     ASTHMA (ICD-493.90)    ALLERGIC RHINITIS (ICD-477.9)    Hx of MYOCARDIAL INFARCTION (ICD-410.90)    HYPERTENSION (ICD-401.9)    HYPERLIPIDEMIA (ICD-272.4)    CORONARY HEART DISEASE (ICD-414.00)-status post percutaneous intervention in November of 08        Family History:    heart disease: mother, father, paternal grandparents, maternal grandparents.  Social History:    Patient states former smoker.     pt is married.    pt has children.   Risk Factors:  Tobacco use:  quit    Year quit:  Jan. 2009    Pack-years:  approx 1 pack per month    Comments:  smoked for approx 2 to 3 years    Review of Systems      See HPI   Vital Signs:  Patient Profile:   50 Years Old Male Height:     69 inches Weight:       220.25 pounds O2 Sat:      95 % O2 treatment:    Room Air Temp:     98.4 degrees F oral Pulse rate:   55 / minute BP sitting:   142 / 100  (right arm) Cuff size:   regular  Vitals Entered By: Cyndia Diver LPN (August 20, 2007 9:17 AM)             Is Patient Diabetic? No Comments Medications reviewed with patient  ..................................................................Marland KitchenCyndia Diver LPN  August 20, 2007 9:18 AM      Physical Exam  General:     overweight male in no acute distress Eyes:     PERRLA and EOMI.   Nose:     deviated septum to the left but patent Mouth:     elongation of soft palate and uvula Neck:     no JVD, thyromegaly, or lymphadenopathy. Lungs:     totally clear to auscultation Heart:     regular rate and rhythm, no MRG Abdomen:     soft and nontender, bowel sounds present Extremities:     no significant edema, pulses intact distally Neurologic:     alert and oriented, moves all 4 extremities.  Impression & Recommendations:  Problem # 1:  OBSTRUCTIVE SLEEP APNEA (ICD-327.23) the patient has mild obstructive sleep apnea, but I suspect a lot of his daytime symptoms are more due to his poor sleep hygiene than his sleep apnea.  The patient does not get near enough sleep each night, and I've asked him to try and commit to a longer sleep time.  His degree of sleep apnea puts him at very low cardiovascular risk.  I have discussed with him the various options for treatment, including weight loss, upper airway surgery, oral appliance, and finally CPAP.  The patient would like to work on weight loss and improved sleep hygiene.  I am okay with this, as long as he is willing to follow-up with me in 6 months to see how things are going.  If he has not made any progress in weight loss, he will consider alternative treatments.  He will also call me if he changes his mind and would like to treat his mild sleep apnea more aggressively.  Medications Added  to Medication List This Visit: 1)  Diovan Hct 160-12.5 Mg Tabs (Valsartan-hydrochlorothiazide) .... Take 1 tablet by mouth two times a day   Patient Instructions: 1)  work on 20lb wt loss over next 6mos 2)  if not successful, or if worsening symptoms, need f/u with me 3)  work on sleep schedule, and put a priority on good sleep    ]

## 2010-05-31 NOTE — Progress Notes (Signed)
Summary: fmla letter  Medications Added METOPROLOL TARTRATE 50 MG TABS (METOPROLOL TARTRATE)        Phone Note Call from Patient   Caller: Patient Reason for Call: Talk to Nurse Summary of Call: pt wants to know when can he pick up the fmla letter that he requested a week ago. pls call the pt when it is ready. Initial call taken by: Sydell Axon,  August 19, 2008 4:33 PM  Follow-up for Phone Call        [facility] that Dr Riley Kill needs to review form prior to completion.  MD will be in the office 08/24/08.   Julieta Gutting, RN, BSN  August 20, 2008 1:30 PM [facility] that Paperwork completed and placed at the front desk for  pick-up. Also, a copy of 07/28/08 labs placed at front desk. Follow-up by: Julieta Gutting, RN, BSN,  August 25, 2008 1:23 PM    New/Updated Medications: METOPROLOL TARTRATE 50 MG TABS (METOPROLOL TARTRATE)

## 2010-05-31 NOTE — Letter (Signed)
Summary: Generic Letter  Architectural technologist, Main Office  [address]   [place], [place] [address]   Phone: [phone]  Fax: [phone]        February 21, 2010 MRN: [id number]    Austin Ingram [address] [place], [place]  [address]   To whom it may concern:  Mr Vlcek has a complicated cardiac situation.  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  He would be best served by working on a day time shift.    Sincerely,    Arturo Morton. Riley Kill, MD, [facility], FSCAI  Julieta Gutting, RN, BSN  This letter has been electronically signed by your physician.

## 2010-05-31 NOTE — Assessment & Plan Note (Signed)
Summary: f30m   Visit Type:  6 mo f/u Referring Provider:  Shawnie Pons Primary Provider:  Lupe Carney  CC:  chest pain..sob.Marland Kitchenpt denies any edema..pt states he has had a cold for the last week.  History of Present Illness: Patient had a cold a few weeks ago, and got a cold, and felt congested.  Was told he had sleep apnea, and told to lose weight, and has a deviated septum.  Has not wanted to have anything done.  I showed him the cardiovascular consequences of continued smoking as well as chronic sleep apnea, and its impact on cardiac outcomes.   He has not had an increase in symptoms overall, and denies ongoing chest pain at the present time.  Long discussion today in excess of 30 minutes.  Ran out of metoprolol recently, and has not taken in about a week.  Last BP here was 120/80.    Current Medications (verified): 1)  Amlodipine Besylate 5 Mg  Tabs (Amlodipine Besylate) .... Take 1 Tablet By Mouth Once A Day 2)  Klor-Con 10 10 Meq  Tbcr (Potassium Chloride) .... Take 1 Tablet By Mouth Once A Day 3)  Pravastatin Sodium 40 Mg  Tabs (Pravastatin Sodium) .... Take 1 Tablet By Mouth Once A Day 4)  Nitrostat 0.4 Mg Subl (Nitroglycerin) .Marland Kitchen.. 1 Tablet Under Tongue At Onset of Chest Pain; You May Repeat Every 5 Minutes For Up To 3 Doses. 5)  Tekturna 150 Mg Tabs (Aliskiren Fumarate) .... Take 1 Tablet By Mouth Once A Day 6)  Aspirin Ec 325 Mg Tbec (Aspirin) .... Take One Tablet By Mouth Daily 7)  Citalopram Hydrobromide 20 Mg Tabs (Citalopram Hydrobromide) .Marland Kitchen.. 1 Tab Once Daily 8)  Etodolac 400 Mg Tabs (Etodolac) .Marland Kitchen.. 1 Tab Two Times A Day As Needed  Allergies: No Known Drug Allergies  Past History:  Past Medical History: Last updated: 08/20/2007 Current Problems:  ASTHMA (ICD-493.90) ALLERGIC RHINITIS (ICD-477.9) Hx of MYOCARDIAL INFARCTION (ICD-410.90) HYPERTENSION (ICD-401.9) HYPERLIPIDEMIA (ICD-272.4) CORONARY HEART DISEASE (ICD-414.00)-status post percutaneous intervention in  November of 08    Vital Signs:  Patient profile:   50 year old male Height:      69 inches Weight:      208 pounds BMI:     30.83 Pulse rate:   54 / minute Pulse rhythm:   irregular BP sitting:   200 / 100  (left arm) Cuff size:   large  Vitals Entered By: Danielle Rankin, CMA (March 17, 2009 4:45 PM)  Physical Exam  General:  Well developed, well nourished, in no acute distress.  Moderately overweight Head:  Deviated nasal septum. Lungs:  Clear bilaterally to auscultation and percussion. Heart:  PMI non displaced.  Normal S1 and S2.  No murmur or rub. Abdomen:  Bowel sounds positive; abdomen soft and non-tender without masses, organomegaly, or hernias noted. No hepatosplenomegaly. Pulses:  pulses normal in all 4 extremities Extremities:  No clubbing or cyanosis. Neurologic:  Alert and oriented x 3.   EKG  Procedure date:  03/17/2009  Findings:      SB.Anteroseptal MI, old, with persistent ST and T wave changes.  Impression & Recommendations:  Problem # 1:  Hx of MYOCARDIAL INFARCTION (ICD-410.90) No current symptoms.  On medical therpay.  His updated medication list for this problem includes:    Amlodipine Besylate 10 Mg Tabs (Amlodipine besylate) .Marland Kitchen... Take one tablet by mouth daily    Nitrostat 0.4 Mg Subl (Nitroglycerin) .Marland Kitchen... 1 tablet under tongue at onset of chest pain;  you may repeat every 5 minutes for up to 3 doses.    Aspirin Ec 325 Mg Tbec (Aspirin) .Marland Kitchen... Take one tablet by mouth daily  Problem # 2:  OBSTRUCTIVE SLEEP APNEA (ICD-327.23) Reviewed and again discussed the importance of getting this treated.  Problem # 3:  HYPERTENSION (ICD-401.9) Not currently controlled.  Was controlled previously but he stopped his metoprolol last week.  However, HR was in low 40's and today was on ly 54 on no metoprolol.  Therefore, would suggest increasing amlodipine to 10mg  by mouth daily.  Prescription placed.  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 Clovis Riley for further adjustment.  He will need close followup and is to see Dr. Clovis Riley again on November 3. His updated medication list for this problem includes:    Amlodipine Besylate 10 Mg Tabs (Amlodipine besylate) .Marland Kitchen... Take one tablet by mouth daily    Tekturna 150 Mg Tabs (Aliskiren fumarate) .Marland Kitchen... Take 1 tablet by mouth once a day    Aspirin Ec 325 Mg Tbec (Aspirin) .Marland Kitchen... Take one tablet by mouth daily  Problem # 4:  HYPERLIPIDEMIA (ICD-272.4) Needs followup labs with Dr. Clovis Riley. His updated medication list for this problem includes:    Pravastatin Sodium 40 Mg Tabs (Pravastatin sodium) .Marland Kitchen... Take 1 tablet by mouth once a day  Patient Instructions: 1)  Your physician recommends that you schedule a follow-up appointment in: 4months with dr Riley Kill 2)  Your physician has recommended you make the following change in your medication: discontinue potassium--please increase amlodipine 10mg  one by mouth daily 3)  Your physician recommends that you return for lab work in:  please have your primary care doctor draw a BMET in 1 week Prescriptions: AMLODIPINE BESYLATE 10 MG TABS (AMLODIPINE BESYLATE) Take one tablet by mouth daily Brand medically necessary #90 x 3   Entered and Authorized by:   Ronaldo Miyamoto, MD, [facility]   Signed by:   Ronaldo Miyamoto, MD, [facility] on 03/17/2009   Method used:   Electronically to        [facility] [address] [phone]* (retail)       [address]       [place], [place]  [address]       Ph: [phone] or [phone]       Fax: [phone]   RxID:   [id number]   Handout requested. METOPROLOL SUCCINATE 50 MG XR24H-TAB (METOPROLOL SUCCINATE) Take one tablet by mouth daily  #30 x 10   Entered by:   Ledon Snare, RN   Authorized by:   Ronaldo Miyamoto, MD, [facility]   Signed by:   Ledon Snare, RN on 03/17/2009   Method used:   [facility]        [facility] [address] [phone]* (retail)       [address]       [place], [place]  [address]       Ph: [phone] or [phone]       Fax: [phone]   RxID:   [id number] NITROSTAT 0.4 MG SUBL (NITROGLYCERIN) 1 tablet under tongue at onset of chest pain; you may repeat every 5 minutes for up to 3 doses.  #25 x 9   Entered by:   Danielle Rankin, CMA   Authorized by:   Ronaldo Miyamoto, MD, [facility]   Signed by:  Danielle Rankin, CMA on 03/17/2009   Method used:   Electronically [facility]        [facility] [address] [phone]* (retail)       [address]       [place], [place]  [address]       Ph: [phone] or [phone]       Fax: [phone]   RxID:   [id number]  Metoprolol was cancelled.  Instructed patient not to pick up metoprolol because of bradycardia.

## 2010-05-31 NOTE — Assessment & Plan Note (Signed)
Summary: [facility]   Referring Provider:  Shawnie Pons Primary Provider:  Lupe Carney   History of Present Illness: Patient in for followup visit.  He is currently doing well, but has on occasion a stinging type pain, non pressure, not related to exertion.  Feels fatigue and sleeping all of the time. Does not want to use CPAP for OSA and would prefer to loose weight, although he has gained a pound since the last OV.  Vaguely remembers something in his family about sickle trait.  Current Medications (verified): 1)  Amlodipine Besylate 5 Mg  Tabs (Amlodipine Besylate) .... Take 2 Tablets Once Daily 2)  Bayer Aspirin 325 Mg  Tabs (Aspirin) .... Take 1 Tablet By Mouth Once A Day 3)  Plavix 75 Mg  Tabs (Clopidogrel Bisulfate) .... Take 1 Tablet By Mouth Once A Day 4)  Sertraline Hcl 100 Mg  Tabs (Sertraline Hcl) .... Take 1/2 Tablet Once Daily 5)  Klor-Con 10 10 Meq  Tbcr (Potassium Chloride) .... Take 1 Tablet By Mouth Once A Day 6)  Diovan Hct 160-12.5 Mg  Tabs (Valsartan-Hydrochlorothiazide) .... Take 1 Tablet By Mouth Two Times A Day 7)  Pravastatin Sodium 40 Mg  Tabs (Pravastatin Sodium) .... Take 1 Tablet By Mouth Once A Day 8)  Nitroquick 0.4 Mg  Subl (Nitroglycerin) .... As Needed 9)  Lopressor 50 Mg Tabs (Metoprolol Tartrate) .... Take 1 Tablet By Mouth Two Times A Day 10)  Tekturna 150 Mg Tabs (Aliskiren Fumarate) .... Take 1 Tablet By Mouth Once A Day  Allergies (verified): No Known Drug Allergies  Vital Signs:  Patient profile:   50 year old male Height:      70 inches Weight:      211.25 pounds BMI:     30.42 Pulse rate:   46 / minute Pulse rhythm:   regular Resp:     18 per minute BP sitting:   118 / 80  (left arm) Cuff size:   large  Vitals Entered By: Vikki Ports (July 22, 2008 4:29 PM)  Physical Exam  General:  Well developed, well nourished, in no acute distress. Lungs:  Clear bilaterally to auscultation and percussion. Heart:  Non-displaced  PMI, chest non-tender; regular rate and rhythm, S1, S2 without murmurs, rubs or gallops. Carotid upstroke normal, no bruit. Normal abdominal aortic size, no bruits. Femorals normal pulses, no bruits. Pedals normal pulses. No edema, no varicosities.   EKG  Procedure date:  07/22/2008  Findings:      Marked Sinus bradycardia, ASMI, age indeterminate, with T wave changes laterally, seen on prior tracings.  Impression & Recommendations:  Problem # 1:  Hx of MYOCARDIAL INFARCTION (ICD-410.90)  The following medications were removed from the medication list:    Metoprolol Tartrate 50 Mg Tabs (Metoprolol tartrate) .Marland Kitchen... Take 1/2 tablet two times a day His updated medication list for this problem includes:    Amlodipine Besylate 5 Mg Tabs (Amlodipine besylate) .Marland Kitchen... Take 2 tablets once daily    Bayer Aspirin 325 Mg Tabs (Aspirin) .Marland Kitchen... Take 1 tablet by mouth once a day    Plavix 75 Mg Tabs (Clopidogrel bisulfate) .Marland Kitchen... Take 1 tablet by mouth once a day    Nitroquick 0.4 Mg Subl (Nitroglycerin) .Marland Kitchen... As needed    Lopressor 50 Mg Tabs (Metoprolol tartrate) .Marland Kitchen... Take 1 tablet by mouth two times a day Remains stable with some minimal symptoms.  Continue current medications.  Problem # 2:  HYPERTENSION (ICD-401.9)  The following medications were removed from  the medication list:    Metoprolol Tartrate 50 Mg Tabs (Metoprolol tartrate) .Marland Kitchen... Take 1/2 tablet two times a day His updated medication list for this problem includes:    Amlodipine Besylate 5 Mg Tabs (Amlodipine besylate) .Marland Kitchen... Take 2 tablets once daily    Bayer Aspirin 325 Mg Tabs (Aspirin) .Marland Kitchen... Take 1 tablet by mouth once a day    Diovan Hct 160-12.5 Mg Tabs (Valsartan-hydrochlorothiazide) .Marland Kitchen... Take 1 tablet by mouth two times a day    Lopressor 50 Mg Tabs (Metoprolol tartrate) .Marland Kitchen... Take 1 tablet by mouth two times a day    Tekturna 150 Mg Tabs (Aliskiren fumarate) .Marland Kitchen... Take 1 tablet by mouth once a day Reduce metoprolol because of  marked bradycardia, and fatigue.  Watch BP, get BP cuff, and record pressures.  If they go up they will need treatment.  Problem # 3:  HYPERLIPIDEMIA (ICD-272.4)  His updated medication list for this problem includes:    Pravastatin Sodium 40 Mg Tabs (Pravastatin sodium) .Marland Kitchen... Take 1 tablet by mouth once a day Needs lipid and liver profile, and weight loss program.  Problem # 4:  OBSTRUCTIVE SLEEP APNEA (ICD-327.23) Needs to lose weight in order to achieve better result.   Also told he had deviated septum.  Reinforced need to take some action on treatment options.  Problem # 5:  Abnormal MCV CHeck labs including iron, stool cards, ferritin.  Complete Medication List: 1)  Amlodipine Besylate 5 Mg Tabs (Amlodipine besylate) .... Take 2 tablets once daily 2)  Bayer Aspirin 325 Mg Tabs (Aspirin) .... Take 1 tablet by mouth once a day 3)  Plavix 75 Mg Tabs (Clopidogrel bisulfate) .... Take 1 tablet by mouth once a day 4)  Sertraline Hcl 100 Mg Tabs (Sertraline hcl) .... Take 1/2 tablet once daily 5)  Klor-con 10 10 Meq Tbcr (Potassium chloride) .... Take 1 tablet by mouth once a day 6)  Diovan Hct 160-12.5 Mg Tabs (Valsartan-hydrochlorothiazide) .... Take 1 tablet by mouth two times a day 7)  Pravastatin Sodium 40 Mg Tabs (Pravastatin sodium) .... Take 1 tablet by mouth once a day 8)  Nitroquick 0.4 Mg Subl (Nitroglycerin) .... As needed 9)  Lopressor 50 Mg Tabs (Metoprolol tartrate) .... Take 1 tablet by mouth two times a day 10)  Tekturna 150 Mg Tabs (Aliskiren fumarate) .... Take 1 tablet by mouth once a day  Patient Instructions: 1)  Get labs. 2)  Followup in 3 months. 3)  Attempt weight loss 4)  Support changing to daytime job 5)  Limit lifting.  Appended Document: [facility] Complete Medication List should reflect change in Metoprolol tartrate to 50mg  one-half tablet by mouth twice a day.  This was done because of fatigue and marked bradycardia.

## 2010-05-31 NOTE — Letter (Signed)
Summary: Work Engineer, agricultural, Main Office  [address]   [place], [place] [address]   Phone: [phone]  Fax: [phone]        March 23, 2010 MRN: [id number]    Austin Ingram [address] [place], [place]  [address]    To Whom it May Concern:  Mr Runquist has a complicated cardiac situation.  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  He would be best served by working a schedule that can be adjusted to accomodate his sleep pattern.    Sincerely,    Arturo Morton. Riley Kill, MD, [facility], [facility]

## 2010-05-31 NOTE — Assessment & Plan Note (Signed)
Summary: 4 mo f/u   Visit Type:  4 months follow up Referring Provider:  Shawnie Pons Primary Provider:  Lupe Carney  CC:  chest pain.  History of Present Illness: Patient has had a couple of episodes of chest pain.  One time occurred laying around, took a NTG, and went to sleep.  One time at work doing physical work, developed chest pain.  He feels he is forced into jobs at work that are hard for him to do.  He thinks daytime work will help him do better.  Had previous sleep evaluation suggesting fixing nasal septum, and losing weight.     Current Medications (verified): 1)  Amlodipine Besylate 10 Mg Tabs (Amlodipine Besylate) .... Take One Tablet By Mouth Daily 2)  Pravastatin Sodium 40 Mg  Tabs (Pravastatin Sodium) .... Take 1 Tablet By Mouth Once A Day 3)  Nitrostat 0.4 Mg Subl (Nitroglycerin) .Marland Kitchen.. 1 Tablet Under Tongue At Onset of Chest Pain; You May Repeat Every 5 Minutes For Up To 3 Doses. 4)  Tekturna 300 Mg Tabs (Aliskiren Fumarate) .... Take 1 Tablet By Mouth Once A Day 5)  Aspirin 81 Mg Tbec (Aspirin) .... Take One Tablet By Mouth Daily 6)  Citalopram Hydrobromide 20 Mg Tabs (Citalopram Hydrobromide) .Marland Kitchen.. 1 Tab Once Daily 7)  Etodolac 400 Mg Tabs (Etodolac) .Marland Kitchen.. 1 Tab Two Times A Day As Needed  Allergies (verified): No Known Drug Allergies  Vital Signs:  Patient profile:   50 year old male Height:      69 inches Weight:      206.25 pounds BMI:     30.57 Pulse rate:   56 / minute Pulse rhythm:   irregular Resp:     18 per minute BP sitting:   126 / 90  (left arm) Cuff size:   large  Vitals Entered By: Vikki Ports (July 22, 2009 4:50 PM)  Physical Exam  General:  Moderately obese gentleman in no distress Head:  normocephalic and atraumatic Eyes:  PERRLA/EOM intact; conjunctiva and lids normal. Neck:  Neck supple, no JVD. No masses, thyromegaly or abnormal cervical nodes.  Neck is large. Lungs:  Clear bilaterally to auscultation and percussion. Heart:  PMI  non displaced.  No murmurs or rubs.  Pos S4 gallop. Abdomen:  Bowel sounds positive; abdomen soft and non-tender without masses, organomegaly, or hernias noted. No hepatosplenomegaly. Extremities:  No clubbing or cyanosis. Neurologic:  Alert and oriented x 3.   EKG  Procedure date:  07/22/2009  Findings:      SB.  ASMI, age indeterminate.  TWI I,AVL, V5-6, noted on ECG one year ago.  No real change.  Impression & Recommendations:  Problem # 1:  CORONARY HEART DISEASE (ICD-414.00) He remains on treatment.  It is difficult to say he has had progression.  OSA may be a significant issue. See reports.  He wants to work days, which makes sense with his illnesses.  He does need treatment for OSA, and he elected weight loss, but really has not lost any.  Issues reviewed in detail.  Risks of CE are increased.   His updated medication list for this problem includes:    Amlodipine Besylate 10 Mg Tabs (Amlodipine besylate) .Marland Kitchen... Take one tablet by mouth daily    Nitrostat 0.4 Mg Subl (Nitroglycerin) .Marland Kitchen... 1 tablet under tongue at onset of chest pain; you may repeat every 5 minutes for up to 3 doses.    Aspirin 81 Mg Tbec (Aspirin) .Marland Kitchen... Take one tablet by mouth  daily  Orders: EKG w/ Interpretation (93000)  Problem # 2:  OBSTRUCTIVE SLEEP APNEA (ICD-327.23) See above.  Clearly needs to deal with this one way or the other.  That, coulped with work schedule likely explains his overall situation.   Orders: EKG w/ Interpretation (93000)  Problem # 3:  HYPERTENSION (ICD-401.9) Borderline control, has been following with Dr. Clovis Riley. Weight lsos benefits discussed in detail with patient. His updated medication list for this problem includes:    Amlodipine Besylate 10 Mg Tabs (Amlodipine besylate) .Marland Kitchen... Take one tablet by mouth daily    Tekturna 300 Mg Tabs (Aliskiren fumarate) .Marland Kitchen... Take 1 tablet by mouth once a day    Aspirin 81 Mg Tbec (Aspirin) .Marland Kitchen... Take one tablet by mouth daily  Orders: EKG  w/ Interpretation (93000)  Problem # 4:  HYPERLIPIDEMIA (ICD-272.4) On lipid lowering treatment.  Will defer treatment decisions to Dr. Clovis Riley.  His updated medication list for this problem includes:    Pravastatin Sodium 40 Mg Tabs (Pravastatin sodium) .Marland Kitchen... Take 1 tablet by mouth once a day  Patient Instructions: 1)  Your physician recommends that you schedule a follow-up appointment in: 3 months 2)  Your physician recommends that you continue on your current medications as directed. Please refer to the Current Medication list given to you today.

## 2010-06-02 NOTE — Assessment & Plan Note (Signed)
Summary: rov for osa, poor sleep hygiene, circ. rhythm issues.   Copy to:  Shawnie Pons Primary Provider/Referring Provider:  Lupe Carney  CC:  5 week f/u appt.  wears cpap machine approx 3 days a week out of 7.   approx 4 to 5 hours.  pt states his work schedule was recently changed.  History of Present Illness: the pt comes in today for f/u of his multiple sleep issues.  At the last visit, he was started on cpap for his mild osa, but has really not seen a lot of benefit with the device.  He does not wear everyday, but when he does, it is for a substantial period of time.  He feels the changes to his sleep schedule and sleep hygiene have helped him more than anything else.    Medications Prior to Update: 1)  Amlodipine Besylate 10 Mg Tabs (Amlodipine Besylate) .... Take One Tablet By Mouth Daily 2)  Nitrostat 0.4 Mg Subl (Nitroglycerin) .Marland Kitchen.. 1 Tablet Under Tongue At Onset of Chest Pain; You May Repeat Every 5 Minutes For Up To 3 Doses. 3)  Tekturna 300 Mg Tabs (Aliskiren Fumarate) .... Take 1 Tablet By Mouth Once A Day 4)  Aspirin 81 Mg Tbec (Aspirin) .... Take One Tablet By Mouth Daily 5)  Citalopram Hydrobromide 20 Mg Tabs (Citalopram Hydrobromide) .Marland Kitchen.. 1 Tab Once Daily 6)  Etodolac 400 Mg Tabs (Etodolac) .Marland Kitchen.. 1 Tab Two Times A Day As Needed 7)  Pravastatin Sodium 80 Mg Tabs (Pravastatin Sodium) .... Take One Tablet By Mouth Daily At Bedtime  Allergies (verified): No Known Drug Allergies  Review of Systems       The patient complains of shortness of breath with activity, indigestion, weight change, hand/feet swelling, and joint stiffness or pain.  The patient denies shortness of breath at rest, productive cough, non-productive cough, coughing up blood, chest pain, irregular heartbeats, acid heartburn, loss of appetite, abdominal pain, difficulty swallowing, sore throat, tooth/dental problems, headaches, nasal congestion/difficulty breathing through nose, sneezing, itching, ear ache,  anxiety, depression, rash, change in color of mucus, and fever.    Vital Signs:  Patient profile:   50 year old male Height:      69 inches Weight:      211 pounds BMI:     31.27 O2 Sat:      99 % on Room air Temp:     98.4 degrees F oral Pulse rate:   50 / minute BP sitting:   148 / 90  (left arm) Cuff size:   large  Vitals Entered By: Arman Filter LPN (May 09, 2010 9:40 AM)  O2 Flow:  Room air CC: 5 week f/u appt.  wears cpap machine approx 3 days a week out of 7.   approx 4 to 5 hours.  pt states his work schedule was recently changed Comments Medications reviewed with patient Arman Filter LPN  May 09, 2010 9:44 AM    Physical Exam  General:  ow male in nad Nose:  no skin breakdown or pressure necrosis from cpap mask Extremities:  no edema or cyanosis  Neurologic:  alert, does not appear sleepy, moves all 4.   Impression & Recommendations:  Problem # 1:  OBSTRUCTIVE SLEEP APNEA (ICD-327.23) the pt has really not worn cpap compliantly, but when he did, it did not help his sleep or his daytime alertness.  He has mild disease, and it is really not a health issue for him.  At this point, will  discontinue cpap, and have encouraged him to work on weight loss.  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  I suspect he will do better once this occurs.  Problem # 3:  INADEQUATE SLEEP HYGIENE (ICD-307.49) the pt has corrected a lot of things wrt his sleep hygiene, and is sleeping much better for it.  He is now able to get 6-7hrs of uninterupted sleep, and feels more rested.  Once he gets to a more normal shift in a month, I suspect things will fall into place for him.  Other Orders: Est. Patient Level III (82956) DME Referral (DME)  Patient Instructions: 1)  will discontinue cpap 2)  continue to work on sleep hygiene 3)  work on  weight loss 4)  followup with me as needed.

## 2010-06-14 ENCOUNTER — Encounter: Payer: Self-pay | Admitting: Cardiology

## 2010-06-14 ENCOUNTER — Ambulatory Visit (INDEPENDENT_AMBULATORY_CARE_PROVIDER_SITE_OTHER): Payer: Federal, State, Local not specified - PPO | Admitting: Cardiology

## 2010-06-14 DIAGNOSIS — E7800 Pure hypercholesterolemia, unspecified: Secondary | ICD-10-CM

## 2010-06-14 DIAGNOSIS — I2510 Atherosclerotic heart disease of native coronary artery without angina pectoris: Secondary | ICD-10-CM

## 2010-06-28 NOTE — Assessment & Plan Note (Signed)
Summary: per checkout/sf per valeria-mj rs per valerie call.gd/lwb   Visit Type:  Follow-up Referring Provider:  Shawnie Pons Primary Provider:  Lupe Carney  CC:  Chest pains.  History of Present Illness: [name] is in for cardiac followup.  He has had some chest pain, but with careful questioning it seems as though it is the same as it has been.  Nonetheless, we discussed the possibility of further evaluation.  In describing it, he says it last about 2-3 seconds, and is brief.  There is no diaphoresis which accompanies the symptoms.  They have cancelled his CPAP and he is no longer using this.  Fortunately, his hours have changed, and he can kind of come in any time he chooses, with a defined set of work to be accomplished.    Problems Prior to Update: 1)  Circadian Rhythm Sleep Disorder Shift Work Type  (ICD-327.36) 2)  Inadequate Sleep Hygiene  (ICD-307.49) 3)  Obstructive Sleep Apnea  (ICD-327.23) 4)  Asthma  (ICD-493.90) 5)  Allergic Rhinitis  (ICD-477.9) 6)  Hx of Myocardial Infarction  (ICD-410.90) 7)  Hypertension  (ICD-401.9) 8)  Hyperlipidemia  (ICD-272.4) 9)  Coronary Heart Disease  (ICD-414.00)  Current Medications (verified): 1)  Amlodipine Besylate 10 Mg Tabs (Amlodipine Besylate) .... Take One Tablet By Mouth Daily 2)  Nitrostat 0.4 Mg Subl (Nitroglycerin) .Marland Kitchen.. 1 Tablet Under Tongue At Onset of Chest Pain; You May Repeat Every 5 Minutes For Up To 3 Doses. 3)  Tekturna 300 Mg Tabs (Aliskiren Fumarate) .... Take 1 Tablet By Mouth Once A Day 4)  Aspirin 81 Mg Tbec (Aspirin) .... Take One Tablet By Mouth Daily 5)  Citalopram Hydrobromide 20 Mg Tabs (Citalopram Hydrobromide) .Marland Kitchen.. 1 Tab Once Daily 6)  Etodolac 400 Mg Tabs (Etodolac) .Marland Kitchen.. 1 Tab Two Times A Day As Needed 7)  Pravastatin Sodium 80 Mg Tabs (Pravastatin Sodium) .... Take One Tablet By Mouth Daily At Bedtime  Allergies (verified): No Known Drug Allergies  Past History:  Past Medical History: Last  updated: 03/10/2010 h/o mild osa ASTHMA (ICD-493.90) ALLERGIC RHINITIS (ICD-477.9) Hx of MYOCARDIAL INFARCTION (ICD-410.90) HYPERTENSION (ICD-401.9) HYPERLIPIDEMIA (ICD-272.4) CORONARY HEART DISEASE (ICD-414.00)-status post percutaneous intervention in November of 08    Past Surgical History: Last updated: 04/30/2008 Thumb surgery Exp lap for hernia  Family History: Last updated: 07/16/2009   His mother had irregular heartbeats and atrial   fibrillation.  His father's medical history is unknown.      Social History: Last updated: 07/16/2009  He lives in [place] with his wife.  He works the   third shift at the [employer].  He does have a child.  He is no   longer smoking.      Vital Signs:  Patient profile:   50 year old male Height:      69 inches Weight:      204.25 pounds BMI:     30.27 Pulse rate:   52 / minute Pulse rhythm:   regular Resp:     18 per minute BP sitting:   144 / 90  (left arm) Cuff size:   large  Vitals Entered By: Vikki Ports (June 14, 2010 4:54 PM)  Physical Exam  General:  Well developed, well nourished, in no acute distress. Head:  normocephalic and atraumatic Eyes:  PERRLA/EOM intact; conjunctiva and lids normal. Lungs:  Clear bilaterally to auscultation and percussion. Heart:  PMI non displaced.  Normal S1 and S2.  No murmur or rub.  Abdomen:  Bowel sounds  positive; abdomen soft and non-tender without masses, organomegaly, or hernias noted. No hepatosplenomegaly. Pulses:  pulses normal in all 4 extremities Extremities:  No clubbing or cyanosis. Neurologic:  Alert and oriented x 3.   EKG  Procedure date:  06/14/2010  Findings:      NSR.  Anterolateral T wave changes, and age indeterminate anterior wall MI.  T inversion extends from V3-V6, and is unchanged.   EKG  Procedure date:  06/14/2010  Findings:      NSR.  Anterior MI of indeterminate age.  T inversion in V3-V6.  I AVL unchanged.    Impression &  Recommendations:  Problem # 1:  CORONARY HEART DISEASE (ICD-414.00) Had some symptoms, but he does not feel advanced.  He knows to call us for a change in pattern.  We will continue to see him in follow up on  a regular basis.   His updated medication list for this problem includes:    Amlodipine Besylate 10 Mg Tabs (Amlodipine besylate) .Marland Kitchen... Take one tablet by mouth daily    Nitrostat 0.4 Mg Subl (Nitroglycerin) .Marland Kitchen... 1 tablet under tongue at onset of chest pain; you may repeat every 5 minutes for up to 3 doses.    Aspirin 81 Mg Tbec (Aspirin) .Marland Kitchen... Take one tablet by mouth daily  Orders: EKG w/ Interpretation (93000)  Problem # 2:  HYPERTENSION (ICD-401.9) borderline control.  Continued weight loss would be beneficial.  reinforced.   His updated medication list for this problem includes:    Amlodipine Besylate 10 Mg Tabs (Amlodipine besylate) .Marland Kitchen... Take one tablet by mouth daily    Tekturna 300 Mg Tabs (Aliskiren fumarate) .Marland Kitchen... Take 1 tablet by mouth once a day    Aspirin 81 Mg Tbec (Aspirin) .Marland Kitchen... 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    Patient Instructions: 1)  Your physician recommends that you schedule a follow-up appointment in: 3 months with Dr. Riley Kill 2)  Your physician recommends that you continue on your current medications as directed. Please refer to the Current Medication list given to you today.

## 2010-09-13 NOTE — Assessment & Plan Note (Signed)
[facility]                         ELECTROPHYSIOLOGY OFFICE NOTE   PANAYIOTIS, RAINVILLE                          MRN:          [id number]  DATE:06/20/2007                            DOB:          1960/09/19    REFERRING PHYSICIAN:  Arturo Morton. Riley Kill, MD, [facility]   Mr. Eisenhart is referred today by Dr. Bonnee Quin for consideration for  prophylactic ICD insertion.   HISTORY OF PRESENT ILLNESS:  The patient is a very pleasant 50 year old  male with a history of some palpitations and coronary artery disease  status post MI with stenting back in November.  Since then he has been  stable except that he has noticed some palpitations, particularly at  night when he turns over onto his side.  He has had no syncope.  He  denies recurrent chest pain following his myocardial infarction.  2-D  echos have been done which demonstrated an EF of approximately 40%.  There are multiple segmental wall motion abnormalities.  The patient has  had no syncope and denies palpitations, other than as previously noted.  He has occasional leg cramps and his hypertension has been relatively  well-controlled.  He  denies any heart failure symptoms at the present  time.   PAST MEDICAL HISTORY:  Is notable for hypertension, dyslipidemia as well  as his prior MI.   Review of cardiac monitor that the patient has obtained has demonstrated  no significant arrhythmias.  He did have some noise artifact which  looked like nonsustained VT but is not.   FAMILY HISTORY:  Is  notable for mother with palpitations and A fib.  Father has no known medical problems.   SOCIAL HISTORY:  The patient is married.  He has occasional tobacco and  ethanol use.  Otherwise stable.   REVIEW OF SYSTEMS:  Otherwise notable for reflux symptoms.  Otherwise  all systems reviewed and negative except as noted above.   PHYSICAL EXAM:  Is notable for him being a pleasant well-appearing 60-  year-old man in no acute  distress.  The blood pressure today was  148/108, pulse was 50 and regular, respirations were 18.  HEENT:  Normocephalic, atraumatic.  Pupils equal and round.  Oropharynx moist.  Sclerae anicteric.  NECK revealed no jugular venous distention.  LUNGS:  Clear bilaterally to auscultation.  No wheezes, rales or rhonchi  are present.  CARDIOVASCULAR:  Exam showed a regular bradycardia with normal S1-S2, no  murmurs, rubs or gallops present.  PMI was  slightly enlarged and  laterally displaced.  ABDOMINAL:  Soft, nontender, nondistended.  No  organomegaly.  Bowel sounds present.  There is no rebound or guarding.  EXTREMITIES demonstrated no cyanosis, clubbing or edema.  NEUROLOGIC:  Alert and oriented x3, cranial nerves intact.  Strength 5/5  and symmetric.  SKIN:  Exam was normal.   EKG demonstrates sinus rhythm with normal axis and intervals.   IMPRESSION:  1. Ischemic cardiomyopathy with EF of 40%.  2. Palpitations and documented arrhythmia.   DISCUSSION:  Mr. Holaday is stable and does not have clear indication for  prophylactic ICD.  I recommend that he continue his present medical  therapy including his beta blocker and Diovan and calcium channel  blockers needed for his blood pressure and repeat echo in approximately  1 year.  Obviously if he has syncope or any palpitations or other  symptoms of arrhythmias, then we would recommend consideration for ICD  implantation.     Doylene Canning. Ladona Ridgel, MD  Electronically Signed    GWT/MedQ  DD: 06/20/2007  DT: 06/21/2007  Job #: [id number]

## 2010-09-13 NOTE — Assessment & Plan Note (Signed)
[facility]                            CARDIOLOGY OFFICE NOTE   COOLIDGE, GOSSARD                          MRN:          [id number]  DATE:06/03/2007                            DOB:          May 02, 1960    [name] is in for followup.  His contracting activity has diminished  with the economy.  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  He seems to be  doing reasonably well.  He is anxious about his overall situation, and  when he rolls over on his side at night, he does feel some rapid  palpitations.  He has also had some discomfort in the legs.  This has  been occurring any time and has not necessarily been related to exertion  or activity.   MEDICATIONS:  1. Aspirin 325 mg daily.  2. Plavix 75 mg daily.  3. Lipitor 80 mg nightly.  4. Lopressor 50 mg b.i.d.  5. Diovan/hydrochlorothiazide 160/12.5 mg p.o. b.i.d.  6. Sertraline 100 mg 1/2 tablet daily.   PHYSICAL EXAMINATION:  GENERAL:  He is alert and oriented, in no acute  distress.  VITAL SIGNS:  The blood pressure is 134/90, pulse 64.  The weight is 211  pounds.  LUNGS:  The lung fields are clear to auscultation and percussion.  HEART:  The cardiac rhythm is regular.  I do not appreciate a  significant murmur.   Importantly, his distal pulses appear to be intact.   IMPRESSION:  1. Coronary artery disease with prior percutaneous coronary      intervention for acute myocardial infarction. Overall ejection      fraction was 45% with akinesis of the mid distal anterolateral      apical and distal inferior segments, and there was scattered      disease involving the other territories.  2. Leg cramps, not clearly associated with activity, on Lipitor and      diuretics.  3. Hypertension under reasonable control.  4. Palpitations.   PLAN:  1. We will try to get an event monitor to get a better handle on what      the palpitations represent.  2. A basic metabolic profile, potassium, and magnesium will  be      obtained to exclude electrolyte imbalances  3. We will check a CPK level and have him began a Lipitor holiday to      see if this in fact might      be the cause of the symptoms in his legs.  They sound fairly      typical.  4. We will reassess his situation in approximately 3 weeks.     Austin Ingram. Riley Kill, MD, [facility]  Electronically Signed    TDS/MedQ  DD: 06/04/2007  DT: 06/04/2007  Job #: [id number]

## 2010-09-13 NOTE — Discharge Summary (Signed)
NAME:  Austin Ingram, Austin Ingram                 ACCOUNT NO.:  [id number]   MEDICAL RECORD NO.:  [id number]          PATIENT TYPE:  INP   LOCATION:  2003                         FACILITY:  [facility]   PHYSICIAN:  Arturo Morton. Riley Kill, MD, FACCDATE OF BIRTH:  03/02/1961   DATE OF ADMISSION:  03/25/2007  DATE OF DISCHARGE:  03/29/2007                         DISCHARGE SUMMARY - REFERRING   BRIEF HISTORY:  Austin Ingram is a 50 year old African American male who was  transported via EMS as a code STEMI .  He was taken emergently to the  catheterization lab.  He described over the weekend feeling very, very  tired and indigestion-like symptoms.  He took over-the-counter  medications without relief.  However on the evening of admission while  getting ready for work, he described his discomfort as heavy and tight.  She called 9-1-1.  The EKG showed ST-segment elevation in the lateral  leads and inferior ST-segment depression.  He received aspirin,  nitroglycerin and morphine, as well as oxygen and transported directly  to the catheterization lab.   His history is notable for hypertension for which he is noncompliant  with his medications.  Tobacco use, alcohol use.   LABORATORY DATA:  On the admission i-STAT showed an H&H of 18.4 and 54,  sodium 139, potassium 4.1, BUN 19, creatinine 1.3.  However on the 26th,  CBC showed an H&H of 13.4 and 43, normal indices, platelets 204, WBCs  9.1.  Sodium 137, potassium 3.9, BUN 15, creatinine 1.22, glucose 163.  On the 27th, H&H was essentially unchanged.  Sodium 140, potassium 3.8,  BUN 18, creatinine 1.23, glucose 92.  Initial CK-MB was 6914 and 662.3  with a relative index 9.6 and a troponin of greater than 100.  Subsequent CK MBs relative indexes were declining.  Third troponin was  31.04.  Fasting lipids showed a total cholesterol 168, triglycerides 76,  HDL 36, LDL 117.  Chest x-ray on admission showed no active disease.  EKG on the 27th showed evolving anterolateral  myocardial infarction.   HOSPITAL COURSE:  The patient was taken emergently to the  catheterization lab on the evening of the 24th by Dr. Riley Kill.  According to Dr. Rosalyn Charters progress note, his EF was 40% with anterior  apical akinesis.  He had a 50% proximal and 50% distal RCA.  He had a  totally occluded proximal RCA with TIMI zero flow just past the first  diagonal.  He also had a 50% mid circumflex and a 40% ramus.  Dr.  Riley Kill opened up the proximal LAD lesion with a bare metal stent  reducing this to 0% and TIMI III flow.  He was admitted to the CCU and  continued on aspirin, Plavix, beta blocker as well as ACE inhibitor.  Statin was also begun.  The following morning, he was doing well.  Cardiac rehab assisted with education and ambulation.  Tobacco cessation  consult was performed.  By the 26th, he was doing considerably well and  was transferred to 2000.  Medications were adjusted.  Dr. Riley Kill  discontinued his lisinopril secondary to dizziness in the past.  By the  28th, he was doing well and it was felt that he could be discharged  home.   DISCHARGE DIAGNOSES:  1. Anterolateral myocardial infarction.  2. Status post bare metal stenting to the left anterior descending,      residual nonobstructive coronary artery disease.  3. Mild left ventricular dysfunction with an ejection fraction of 45%.  4. Hypertension.  5. Noncompliance with medications.  6. Tobacco use.  7. Hyperlipidemia.  8. History as noted previously.   DISPOSITION:  Austin Ingram was discharged home, asked to maintain a low-  sodium, heart-healthy diet.  He is asked not to return to work until  after seeing Dr. Riley Kill on April 01, 2007 at 4:15.  New medications  include aspirin 325 mg daily, Plavix 75 mg daily, Lipitor 80 mg every  night at bedtime, metoprolol 50 mg b.i.d. and Diovan 160 mg daily.  He  was advised no tobacco products, nitroglycerin p.r.n. chest discomfort.  He will need blood work in 6-8  weeks.  Follow up on LFTs and fasting  lipids.  He was also advised to bring all medications to all  appointments and to begin a blood pressure diary at home.   DISCHARGE TIME:  Forty minutes.      Joellyn Rued, [name]      Arturo Morton. Riley Kill, MD, [facility]  Electronically Signed    EW/MEDQ  D:  03/29/2007  T:  03/29/2007  Job:  [id number]   cc:   L. Lupe Carney, M.D.

## 2010-09-13 NOTE — Letter (Signed)
September 30, 2007     RE:  [name], [name]  MRN:  [id number]  /  DOB:  [birth year]   To whom it may concern:   [name] is a patient under my care.  He has had treatment for  myocardial infarction.  It would be my impression that he would be  better served working daytime hours, and not night-time shifts based on  his underlying medical history.  Thank you in advance for any  consideration you might give.    Sincerely,      Austin Morton. Riley Kill, MD, [facility]  Electronically Signed    TDS/MedQ  DD: 09/30/2007  DT: 09/30/2007  Job #: [id number]

## 2010-09-13 NOTE — Assessment & Plan Note (Signed)
[facility]                            CARDIOLOGY OFFICE NOTE   JC, VERON                          MRN:          [id number]  DATE:06/03/2007                            DOB:          [birth year]    Austin Ingram is in for follow-up.  He has had a little bit of sinus  congestion over the past few days.  At times he feels some palpitations  particularly when he  rolls over to his left side.  He has had some  discomfort in the calf that has been going on for a few weeks.  His  lipid profile was done and demonstrated an excellent LDL although the  HDL remain somewhat low at 30.   Today on examination, the weight is up 211 pounds, blood pressure  134/90, pulse is 64.  The lung fields are clear to auscultation and  percussion, and the cardiac rhythm is regular with an S4 gallop.  There  is no evidence of DVT on lower extremity examination.   His most recent echocardiogram also demonstrates an ejection fraction in  the 35-40% range with a wall motion abnormality.  Wall thickness is  mildly increased.  There was borderline aortic valve calcification.   IMPRESSION:  1. Prior myocardial infarction with stenting to the left anterior      descending artery.  2. Moderate reduction in global left ventricular function.  3. Hypercholesterolemia on lipid lowering therapy.  4. Calf discomfort.  5. Hypertension.   PLAN:  1. Basic metabolic profile.  2. Magnesium and potassium.  3. CPK to rule out inflammatory changes.  4. He will hold Lipitor for a 2 week Lipitor holiday to see at this is      the etiology.  5. We will refer him to the electrophysiology service.  6. He will return to clinic in 2 weeks and we keep a close eye.  7. We will get an event monitor.     Arturo Morton. Riley Kill, MD, [facility]  Electronically Signed    TDS/MedQ  DD: 06/03/2007  DT: 06/03/2007  Job #: [id number]

## 2010-09-13 NOTE — Letter (Signed)
July 03, 2007    Austin Ingram, M.D.  [address] [place], [place] [address]   RE:  Austin Ingram, Austin Ingram  MRN:  [id number]  /  DOB:  [birth year]   Dear Austin Ingram:   I had the pleasure of seeing Austin Ingram in the office today in a  followup visit.  In general, he is stable, but his wife complains that  he is sleeping all the time and tired.  His legs have improved since  discontinuation of his Lipitor.  I also had him see Dr. Lewayne Ingram in  the electrophysiology office.  Dr. Ladona Ingram recommended a repeat echo in 1  year.  He has also had a monitor done that did not demonstrate  significant ventricular ectopy.  He has continued to get along well  except for the fatigue.  He also noted that his sex drive is diminished  as well.   He has not had ongoing chest pain.   CURRENT MEDICATIONS:  1. Include aspirin 325 mg daily.  2. Plavix 75 mg daily.  3. Lopressor 50 mg b.i.d.  4. Diovan hydrochlorothiazide 320/12.5 daily.  5. Sertraline 100 mg 1/2 tablet daily.  6. K-Dur 10 mEq daily.   PHYSICAL EXAMINATION:  His weight is 221 pounds, blood pressure 150/100,  pulse 52.  The lung fields were clear to auscultation and percussion.  The cardiac rhythm was regular.  Extremities reveal no edema.   We have discussed a lot his medications in detail.  As a result, we plan  that amlodipine 5 mg daily to his regimen.  We will have him return to  clinic in 1 week.  At that time, depending upon his blood pressure  result, we may like to increase the amlodipine.  The goal would be to  get the metoprolol down to 25 mg p.o. b.i.d., but this is difficult in  light of his current hypertension.  Moreover, his wife says that he  snores quite a bit.  Given his weight, it makes the issue of sleep apnea  something to consider.  Dr. Lewayne Ingram raised this issue with his  office visit.  Based on this, we will get a sleep study.  The goal will  be to try to control his blood pressure.  We have initiated  low-dose  potassium to his regimen because of a low potassium, and we will follow  this up when I see him back in a week or 2.  I will then have him follow  up with you.  We will also recheck his CPK, and he will need continuing  monitoring of his lipid status.  I will defer this to your office.  I  will send you a note when I see him back.  I appreciate the opportunity  of sharing his care.    Sincerely,      Austin Ingram. Austin Kill, MD, [facility]  Electronically Signed    TDS/MedQ  DD: 07/03/2007  DT: 07/03/2007  Job #: [id number]

## 2010-09-13 NOTE — Assessment & Plan Note (Signed)
[facility]                            CARDIOLOGY OFFICE NOTE   Austin Ingram, Austin Ingram                          MRN:          [id number]  DATE:12/25/2007                            DOB:          1960/05/09    Austin Ingram is seen in followup.  In general, he is stable.  He has not  been having any ongoing chest pain.  He is scheduled to see Dr. Lupe Carney next week for followup of his hypertension.  Blood pressure has  been under reasonable control.   On physical today, the blood pressure is 136/100, pulse is 47.  The lung  fields are clear, and the cardiac rhythm is regular.  When the blood  pressure was rechecked by me, it was 130/90.  There is no extremity  edema.   Electrocardiogram demonstrates sinus bradycardia with anterior T-wave  inversion and evidence of anterior infarction of indeterminate age.   IMPRESSION:  1. Status post anterior wall infarction, treated with percutaneous      stenting with late reperfusion.  2. Hypercholesterolemia.  3. Hypertension under modest control.  4. Obstructive sleep apnea.  5. Modest obesity.   RECOMMENDATIONS:  1. Weight loss.  2. Return to clinic in 4 months.  3. Continued management of hypertension by Dr. Clovis Riley with continued      management of hyperlipidemia by him as well.     Arturo Morton. Riley Kill, MD, [facility]  Electronically Signed    TDS/MedQ  DD: 12/25/2007  DT: 12/26/2007  Job #: [id number]   cc:   L. Lupe Carney, M.D.

## 2010-09-13 NOTE — Cardiovascular Report (Signed)
NAME:  Austin Ingram, Austin Ingram                 ACCOUNT NO.:  [id number]   MEDICAL RECORD NO.:  [id number]          PATIENT TYPE:  INP   LOCATION:  2910                         FACILITY:  [facility]   PHYSICIAN:  Arturo Morton. Riley Kill, MD, FACCDATE OF BIRTH:  Apr 23, 1961   DATE OF PROCEDURE:  DATE OF DISCHARGE:                            CARDIAC CATHETERIZATION   INDICATIONS:  The patient is a 50 year old gentleman who works on the  [employer], [employer].  He has hypertension but does not take his medications  regularly.  He presented with several-day history of chest pain, which  culminated in a persistent pain tonight at 6 o'clock for which he called  the [facility].  Diagnostic electrocardiogram was made  in the field, and he was brought straight to the emergency room.  Cath  lab was called in, and when the cath lab was ready, he was brought  straight to the catheterization laboratory.  Risks were explained to the  patient as we were rapidly moving him towards reperfusion therapy.  He  was agreeable to proceed.   PROCEDURE:  1. Left heart catheterization  2. Selective coronary angiography.  3. Selective left ventriculography.  4. Percutaneous angioplasty of the left anterior descending artery      using a nondrug-eluting platform stent.   DESCRIPTION OF PROCEDURE:  The patient was brought to the cath lab and  prepped and draped in usual fashion.  Through an anterior puncture, the  femoral artery was entered.  The patient was markedly hypertensive.  Multiple medicines were given to bring the blood pressure down.  Intravenous nitroglycerin was started, labetalol was administered,  Lopressor was also given. We were eventually able to get the pressure  down.  Following this, views of the right coronary were obtained.  We  then used a guiding catheter to obtain left coronary artery shots.  Bivalirudin was given according to protocol and ACTs were checked.  A  stump of the LAD just after a small  diagonal was observed.  A JL4  guiding catheter was initially utilized, then we had to switch to a  JL35.  We had to fashion several wires in order to get down the LAD, but  we were eventually successful.  There was a total occlusion of the LAD  just at the diagonal.  Following reperfusion, there was a fairly short  lesion which was dilated with a 2.0 x 12 Maverick.  Reperfusion was re-  established.  We then carefully sized for the stent, and a 2.5 x 15  Abbott vascular MiniVision stent was utilized.  This was deployed at 16  atmospheres.  Post dilatation was then done with a 3-mm Quantum Maverick  balloon.  This was done throughout the stent.  There was marked  improvement in appearance of the artery.  There was initially a small  filling defect in the midvessel, but this gradually disappeared, thought  to be due to possible some distal embolization.  The remainder of the  artery demonstrated TIMI III flow at the completion.  Following this,  all catheters were subsequently removed.  Central aortic and  left  ventricular pressures were measured.  A pigtail ventriculography was  then performed in the RAO projection.  Following this, the pigtail  catheter was removed and the femoral sheath was sewn into place.  He was  taken to the CCU in satisfactory clinical condition.   HEMODYNAMIC DATA:  Initial central aortic pressure was 229/139 with a  mean of 172.  Following treatment, LV pressure was eventually 121/18.  There is no gradient on pullback across aortic valve.   ANGIOGRAPHIC DATA:  1. Ventriculography demonstrated preserved global systolic function.      There was an ejection fraction estimated around 45%.  There was      akinesis of the mid distal, anterolateral, apical and distal      inferior segments.  2. The right coronary artery demonstrates about 50% mid-narrowing.      There is 50% narrowing in the mid PDA.  High-grade critical      stenosis is not noted.  3. The left main  is free of critical disease.  4. The LAD is totally occluded just after a small diagonal.  Following      reperfusion therapy with a stent, this was reduced to 0% residual      luminal narrowing with what appears to be a good angiographic      result.  There is TIMI III flow distally.  There was initially a      small filling defect, but this gradually cleared with minor luminal      irregularity.  The first diagonal, which is relatively small, had      some ostial narrowing due to the stenting, but appeared to be      otherwise free of critical disease.  5. The small ramus intermedius is without critical narrowing.  6. The circumflex is relatively large.  There is a large marginal that      has about 40% proximal narrowing.  There is a large AV branch that      goes into a posterolateral branch that has about 50% mid-narrowing.   CONCLUSIONS:  1. Acute anterior wall myocardial infarction treated with primary      reperfusion therapy with total occlusion of the LAD just at the      diagonal with successful reperfusion.  2. Moderate reduction of left ventricular function with an      anteroapical and distal inferior wall motion abnormality.  3. Hypertension not well controlled.   PLAN:  The patient will be moved to the CCU.  Cardiac rehab will be  obtained.  Aspirin, Plavix, beta blockade, ACE inhibitors, and  subsequent statins will be administered.  Risk factor reduction will be  encouraged.  Followup in the clinic will be with Dr. Riley Kill.      Arturo Morton. Riley Kill, MD, [facility]  Electronically Signed     TDS/MEDQ  D:  03/25/2007  T:  03/26/2007  Job:  [id number]   cc:   L. Lupe Carney, M.D.  Arturo Morton. Riley Kill, MD, [facility]

## 2010-09-13 NOTE — H&P (Signed)
NAME:  Austin Ingram, Austin Ingram                 ACCOUNT NO.:  [id number]   MEDICAL RECORD NO.:  [id number]          PATIENT TYPE:  OIB   LOCATION:  2899                         FACILITY:  [facility]   PHYSICIAN:  Arturo Morton. Riley Kill, MD, FACCDATE OF BIRTH:  Apr 27, 1961   DATE OF ADMISSION:  05/07/2008  DATE OF DISCHARGE:                              HISTORY & PHYSICAL   CHIEF COMPLAINT:  Mild chest pain.   HISTORY OF PRESENT ILLNESS:  Mr. Uhls is a 50 year old gentleman who  previously presented with an anterior wall infarction in November 2008.  At that time, he had a total occlusion of the left anterior descending  artery just after a small diagonal.  He underwent stenting of the LAD  with a 2.5-mm non-drug-eluting platform.  Since that time, he has done  relatively well.  He has however had some sweating with exertion and,  more recently, some tightness in the chest associated with activity.  He  has remained hypertensive.  The patient also has obesity, obstructive  sleep apnea, and hypertension.  With these things in mind, it was felt  that we should consider repeat catheterization to document patency of  the LAD, particularly considering his reduction in left ventricular  function.  A repeat echocardiogram done in June 2009 did reveal an  ejection fraction of 35-40%.  The patient has not had any history of  syncope or presyncope.   DRUG ALLERGIES:  No known drug allergies.   MEDICATIONS:  1. Amlodipine 5 mg 2 tablets daily.  2. Plavix 75 mg daily.  3. Klor-Con 10 mEq daily.  4. Diovan/hydrochlorothiazide 320/25 mg daily.  5. Citalopram 20 mg daily.  6. Pravastatin 40 mg daily.  7. Tekturna 300 mg daily.  8. Aspirin 325 mg daily.  9. Metoprolol 50 mg p.o. b.i.d.   PAST MEDICAL HISTORY:  1. Anterior wall infarction, treated with percutaneous stenting with      late reperfusion.  2. Hypercholesterolemia, under lipid-lowering therapy.  3. Hypertension, on multidrug therapy.  4. Obstructive  sleep apnea.  5. Moderate obesity.   PRIOR SURGICAL HISTORY:  Exploratory laparotomy for hernia and thumb  surgery.   SOCIAL HISTORY:  He lives in [place] with his wife.  He works the  third shift at the [employer].  He does have a child.  He is no  longer smoking.   FAMILY HISTORY:  His mother had irregular heartbeats and atrial  fibrillation.  His father's medical history is unknown.   REVIEW OF SYSTEMS:  Negative for hemoptysis, hematemesis, melena,  diarrhea, weight loss, major GI complaints.  He has not had rashes.  The  review of systems completely is unremarkable.  All systems reviewed.   PHYSICAL EXAMINATION:  GENERAL:  He is alert, oriented gentleman in no  distress.  VITAL SIGNS:  Blood pressure is 140/98, pulse 53.  CHEST:  Lung fields are clear.  NECK:  Jugular veins not distended.  Carotid upstrokes are brisk.  CARDIAC:  PMI nondisplaced.  Normal first and second heart sound without  murmurs, rubs, or gallops.  ABDOMEN:  Soft.  EXTREMITIES:  Pulses intact.  NEUROLOGIC:  Nonfocal.   The electrocardiogram demonstrates normal sinus rhythm/sinus  bradycardia.  There is anteroseptal MI of indeterminate age.   LABORATORY DATA:  Hemoglobin 14.3, hematocrit 43.5, platelet count 189.  The MCV is low at 73.6.  Protime is normal, INR 1.0.  Potassium is 3.4,  BUN is 20, creatinine 1.4.   IMPRESSION:  1. Coronary artery disease, status post percutaneous intervention for      anterior wall infarction, with residual disease as noted.  2. Hypertension.  3. Morbid obesity.  4. Sleep apnea.  5. Hypercholesterolemia.   PLAN:  We have discussed the risks, benefits, and alternatives, and he  is agreeable to proceed.  Arrangements will be made at [facility].      Maisie Fus D. Riley Kill, MD, [facility]  Electronically Signed     TDS/MEDQ  D:  05/07/2008  T:  05/07/2008  Job:  [id number]

## 2010-09-13 NOTE — Assessment & Plan Note (Signed)
[facility]                            CARDIOLOGY OFFICE NOTE   CLAUDIUS, MICH                          MRN:          [id number]  DATE:04/22/2008                            DOB:          01-03-1961    Austin Ingram is in for followup.  When he does any kind of heavy work, he  tends to sweat.  He sometimes gets mild chest pain, and this seems to  come on more with physical exertion, lifting, and pulling.  The patient  underwent percutaneous angioplasty of the left anterior descending  artery with a non drug-eluting platform.  This was done in November  2008.   CURRENT MEDICATIONS:  1. Amlodipine 5 mg 2 tablets daily.  2. Tekturna 150 mg daily.  3. Plavix 75 mg daily.  4. Klor-Con 10 mEq daily.  5. Diovan/hydrochlorothiazide 320/25 daily.  6. Citalopram 20 mg daily.  7. Pravastatin 40 mg daily.  8. Tekturna 300 mg daily.  9. Aspirin 325 mg daily.  10.Metoprolol tartrate 50 mg p.o. b.i.d.   PHYSICAL EXAMINATION:  GENERAL:  He is alert and oriented.  VITAL SIGNS:  Blood pressure is 140/95, weight is 210 pounds, pulse is  53.  CHEST:  Lung fields are clear.  CARDIAC:  There is an S4 gallop.  EXTREMITIES:  No edema.   His electrocardiogram demonstrates sinus rhythm with evidence of Q waves  in the anterior precordial leads consistent with anteroseptal MI of  indeterminate age.   IMPRESSION:  1. Prior percutaneous coronary intervention with history of symptoms      worrisome for recurrent ischemia.  2. Prior intervention of left anterior descending artery in the      setting of an anterior wall myocardial infarction with percutaneous      coronary intervention of the left anterior descending just after a      small diagonal.  3. Mid narrowing of the right coronary artery and patent ductus      arteriosus, 50% narrowing of the posterolateral branch, 40%      narrowing of the marginal.   DISPOSITION:  We have talked about the various options.  I  am concerned  about his recurrent symptomatology, and he does do hard work.  His EF  has been estimated in the range of 35-40%.  With these findings and  recurrent symptoms, it was felt that repeat catheterization will be  indicated.  I have explained the risks, benefits, and alternatives to  the patient and he consents to proceed.     Austin Ingram. Riley Kill, MD, [facility]  Electronically Signed    TDS/MedQ  DD: 05/07/2008  DT: 05/07/2008  Job #: [id number]

## 2010-09-13 NOTE — Assessment & Plan Note (Signed)
[facility]                            CARDIOLOGY OFFICE NOTE   [place], [place]                          MRN:          [id number]  DATE:07/10/2007                            DOB:          Sep 02, 1960    [name] is in for follow-up.  He is stable.  His blood pressure is  improved today.   Today, the blood pressure is 134/86, the pulse is 55.  Lung fields are  clear, and the cardiac rhythm is regular.   We plan to take several Cudd, because of his fatigue on beta blockade.  1. He will decrease his metoprolol to 25 mg t.i.d. x3 days.  We have      given specific structures how to do this.  He will then go down to      b.i.d. after 3 days.  2. On day 2, he will increase his amlodipine to 10 mg daily.  I have      discussed with him beta blocker withdrawal and how he might      recognize this, and I have also talked about the potential for      lower extremity edema associated with increasing doses of      amlodipine.  We have reviewed the pharmacology of his drugs in      detail.  He also has a borderline elevated CK, although the reasons      for this are not quite clear.  He has been doing some push-ups but      has not been doing heavy weight lifting.  We plan to recheck this,      and we will continue him on Pravachol and fairly low dose at the      present time.  He will see Lauren in the office for a blood      pressure check next week, and then I will check him back in 2      weeks.     Arturo Morton. Riley Kill, MD, [facility]  Electronically Signed    TDS/MedQ  DD: 07/10/2007  DT: 07/11/2007  Job #: [id number]

## 2010-09-13 NOTE — Assessment & Plan Note (Signed)
[facility]                            CARDIOLOGY OFFICE NOTE   BEREKET, GERNERT                          MRN:          [id number]  DATE:11/07/2007                            DOB:          14-Nov-1960    Renee is in for followup.  In general, he has been stable.  He saw Dr.  Shelle Iron.  Apparently, he has a deviated septum, and Dr. Shelle Iron  recommended either a repair of the deviated septum or significant weight  loss.  The patient prefers the latter.  Importantly, he is starting to  work on this during the past week.  I spoke with Dr. Ladona Ridgel as well, and  based on his current ejection fraction, he did not feel that EP  evaluation is indicated currently.   CURRENT MEDICATIONS:  1. Metoprolol 50 mg one-half tablet p.o. b.i.d.  2. Amlodipine 5 mg 2 tablets daily.  3. Diovan HCT 160/12.5 b.i.d.  4. Tekturna 150 daily.   PHYSICAL EXAMINATION:  GENERAL:  He is alert and oriented, in no  distress.  VITAL SIGNS:  Blood pressure 138/98, pulse 50.  LUNG:  Fields are clear.  CARDIAC:  Rhythm is regular.  There is a S4 gallop, which is prominent.   EKG reveals evidence of an anterior infarction of indeterminate age.   IMPRESSION:  1. Status post anterior wall myocardial infarction, treated with      percutaneous stenting.  2. Hypercholesterolemia.  3. Hypertension.  4. Obstructive sleep apnea.   RECOMMENDATIONS:  1. Weight loss.  2. Return to clinic in 6 or 8 weeks.  3. Continue management of hypertension with Dr. Clovis Riley.  4. Continue treatment of hyperlipidemia.     Arturo Morton. Riley Kill, MD, [facility]  Electronically Signed    TDS/MedQ  DD: 11/07/2007  DT: 11/07/2007  Job #: [id number]

## 2010-09-13 NOTE — Procedures (Signed)
[facility]                              EXERCISE TREADMILL   NAME:Ungerer, Grabiel                          MRN:          [id number]  DATE:04/23/2007                            DOB:          30-Aug-1960    Exercise tolerance.   The duration of exercise was 9 minutes.  Maximum heart rate 138, 77%.   COMMENTS:  Mr. Derderian exercised today on the Bruce protocol.  He had no  chest pain.  The test was terminated due to fatigue.  EKG demonstrated  evidence of an anterior wall infarction.  At maximum stress, there was  no significant ST depression noted.  The study was nondiagnostic due to  failure to achieve 85% of age-predicted maximum but without significant  evidence of ischemia and a modestly good workload.   DISPOSITION:  Patient has had a prior anterior wall infarction, treated  with primary stenting.  He has done well from a clinical standpoint.  He  can return to work on January 5th.  He should remain on aspirin and  Plavix at the present time.  I have cautioned him to get his blood  pressure checked, and he is going to get a blood pressure cuff for  Christmas, so hopefully will do this.  We will get a BMET.  I plan to  have him back for an echocardiogram in six weeks, and at that time, we  will reassess his overall situation.  The patient has considerable  stress at home.  Finally, the patient does need to have a lipid and  liver profile at this point in time, which can be checked in six weeks.     Arturo Morton. Riley Kill, MD, [facility]  Electronically Signed    TDS/MedQ  DD: 04/23/2007  DT: 04/24/2007  Job #: [id number]

## 2010-09-13 NOTE — Procedures (Signed)
NAME:  [place], [place]                 ACCOUNT NO.:  [id number]   MEDICAL RECORD NO.:  [id number]         PATIENT TYPE:  OUT   LOCATION:  SLEEP CENTER                 FACILITY:  [facility]   PHYSICIAN:  Barbaraann Share, MD,FCCPDATE OF BIRTH:  May 21, 1960   DATE OF STUDY:  07/19/2007                            NOCTURNAL POLYSOMNOGRAM   REFERRING PHYSICIAN:  Arturo Morton. Riley Kill, MD, [facility]   REFERRING PHYSICIAN:  Arturo Morton. Riley Kill, MD, [facility]   INDICATIONS FOR STUDY:  Hypersomnia with sleep apnea.   EPWORTH SCORE:  12.   SLEEP ARCHITECTURE:  The patient had a total sleep time during the  diagnostic portion of 124 minutes, and 210 minutes during the titration  portion.  Sleep onset latency was normal, and the patient never achieved  REM during the diagnostic portion.  The quantity of slow-wave sleep in  REM were extremely decreased during the entire night.  Sleep efficiency  was 82% during the diagnostic portion and 94% during the titration  portion.   RESPIRATORY DATA:  The patient underwent a split night study where he  was found to have 27 obstructive events in the first 130 minutes of  sleep.  This gave him an apnea-hypopnea index during the diagnostic  portion of 13 events per hour.  The events were not positional and there  was moderate snoring noted throughout. By protocol, he was then place on  a large Quattro full face mask and titrated to a final CPAP  pressure of  12-cm in order to control both obstructive events and snoring.   OXYGEN DATA:  The patient had 02 desaturation as low as 83% with his  obstructive events prior to CPAP initiation.  He had transient 02  desaturation to 88% after CPAP.   CARDIAC DATA:  Rare PVC noted, but no clinically significant  arrhythmias.   MOVEMENT/PARASOMNIA:  Small numbers of leg jerks without significant  sleep disruption.   IMPRESSION/RECOMMENDATIONS:  1. Split night study reveals mild obstructive sleep apnea with an      apnea-hypopnea index  of 13 events per hour and 02 desaturation as      low as 83% during the diagnostic portion of this study.  The      patient was then placed on a large Quattro full face mask and      titrated to a final CPAP pressure of  12-cm of water.  1. Rare PVC but no clinically significant arrhythmia.      Barbaraann Share, MD,FCCP  Diplomate, American Board of Sleep  Medicine  Electronically Signed     [facility]/MEDQ  D:  08/06/2007 17:08:48  T:  08/06/2007 19:55:11  Job:  [id number]   cc:   Arturo Morton. Riley Kill, MD, [facility]  [address]  [address]  [place]  [place] [address]

## 2010-09-13 NOTE — Letter (Signed)
Sep 24, 2007    L. Lupe Carney, M.D.  [address] [place], [place] [address]   RE:  ERMAN, THUM  MRN:  [id number]  /  DOB:  [birth year]   Dear [name]:   I had the pleasure of seeing [place] in the office today in a follow-  up evaluation.  In general, he has been doing well.  He wants to lift  weights and run, but I have encouraged him not do this.  He denies any  ongoing chest pain at the present time.   I understand that you have recently started him on Tekturna.   PHYSICAL EXAMINATION:  VITAL SIGNS:  Today on examination, the blood  pressure is 126/90, pulse 47.  LUNGS:  The lung fields are clear.  CARDIAC:  Rhythm is regular with an S4 gallop.  There is no extremity  edema.   Electrocardiogram demonstrates marked sinus bradycardia.  There is  evidence of anterior wall infarction with persistent ST elevation and T-  wave inversion.   We have taken the liberty of ordering a 2-D echo to reassess his left  ventricular function.  In addition, we will get a basic metabolic  profile.  We will add a magnesium to that.  I will see him  back in follow-up in about 4-6 weeks.  If his LV function remains  depressed, we will refer him to the electrophysiologist for further  evaluation.  I appreciate the opportunity of sharing in his care and  will keep you posted.  We have not checked his lipid profile and I  thought this would be best done in your office.    Sincerely,      Arturo Morton. Riley Kill, MD, [facility]  Electronically Signed    TDS/MedQ  DD: 09/24/2007  DT: 09/24/2007  Job #: [id number]

## 2010-09-13 NOTE — Letter (Signed)
April 01, 2007    L. Lupe Carney, M.D.  [address] [place], [place] [address]   RE:  LOC, FEINSTEIN  MRN:  [id number]  /  DOB:  08-13-1960   Dear [name],   As we discussed, I had the pleasure of seeing Mr. Hechler in the office  today in a follow-up visit.  In general, he has been stable.  As you  know, he presented with about 3-4 days of ongoing chest pain.  EKG  showed evidence of an anterior ST elevation myocardial infarction.  He  had a total occlusion of the LAD and underwent reperfusion therapy.  Following the procedure, he had persistent ST elevation.  He had some  dyskinesis in the anteroapical segment.  He had stenting of the LAD,  which was successful.  Since discharge, he has been relatively stable.  He had one minor episode of chest discomfort, for which he took a  sublingual nitroglycerin.  He has not been smoking since discharge.  As  you know, he works for the [employer].   CURRENT MEDICATIONS:  1. Aspirin 325 mg daily.  2. Plavix 75 mg daily.  3. Lipitor 80 mg at bedtime.  4. Lopressor 50 mg p.o. b.i.d.  5. Diovan/HCT 160/12.5 mg daily.   PHYSICAL EXAMINATION:  GENERAL:  He is alert and oriented, in no  distress.  VITAL SIGNS:  Blood pressure 144/97, which is much better than his  usual.  Rechecked by me, it was 140/90.  EXTREMITIES:  The groin was well healed, without any evidence of bruit,  hematoma, or other abnormalities.  Distal pulse was intact.  CARDIAC:  Rhythm was regular, with an S4 gallop.  There was no definite  murmur.  LUNG FIELDS:  Clear to auscultation and percussion.   The electrocardiogram today demonstrates evidence of a recent  anteroseptal myocardial infarction.  There remains persistent ST  elevation.  Of note, during his hospitalization, the CPK total was 6900,  with an MB of 662.  His LDL cholesterol was 117, with a total  cholesterol of 168, and LDL of 36 before the medicine was started.   I reviewed the films with the  patient and his wife in the office.  We  have carefully gone over every aspect of his care.  We have made it  entirely clear that he needs to take good care of himself.  He has  reduced his salt intake, and we are going to work on trying to get his  blood pressure down.  He is to return for a basic metabolic profile  later this week.  With regard to his blood pressure, he may need  additional medications.  His ventriculogram suggests dyskinesis, so I am  not too hopeful that he will have substantial improvement in overall  left ventricular function, but we will need to keep a close eye on this  and follow this up.  We will have him come in for an exercise tolerance  test in the next 2 weeks to describe an exercise prescription, and  moreover to decide on return to work.   I appreciate the opportunity of sharing in this gentleman's care.    Sincerely,      Arturo Morton. Riley Kill, MD, [facility]  Electronically Signed    TDS/MedQ  DD: 04/01/2007  DT: 04/02/2007  Job #: [id number]

## 2010-09-13 NOTE — Assessment & Plan Note (Signed)
[facility]                            CARDIOLOGY OFFICE NOTE   CARNEY, SAXTON                          MRN:          [id number]  DATE:08/02/2007                            DOB:          [birth year]    HISTORY:  [place] In[place] is in for followup.  In summary, he has been stable.  He is actually doing somewhat better.  He feels significantly less  fatigued.  His blood pressure control has improved.  In general, the  changes that we have made have been fairly positive.   MEDICATIONS:  1. His medications now include metoprolol 50 mg 1/2 tablet p.o. b.i.d.  2. Amlodipine 10 mg daily.  3. Aspirin 325 mg daily.  4. Plavix 75 mg daily.  5. Sertraline 100 mg 1/2 daily.  6. K-Dur 10 mEq daily.  7. Diovan/HCT 320/25 daily.  8. Pravastatin 40 mg daily.   PHYSICAL EXAMINATION:  VITAL SIGNS:  Currently on examination, the blood  pressure is 146/93, the pulse is 53.  LUNGS:  The lung fields are clear.  CARDIOVASCULAR:  The cardiac exam is unchanged.   [place] has had a sleep study done but no report is currently  available.  We will await till we get this in but he will likely need  referral to pulmonary medicine for sleep evaluation based on the sleep  study results.  I have encouraged him to try to lose weight which would  improve his blood pressure control, his overall preventative situation  and possibly limit the impact of any type of sleep apnea.  We will  continue to have him follow up in the cardiology clinic and he will  continue with followup with Dr. Clovis Riley for his primary care.     [place] Ingram. Riley Kill, MD, [facility]  Electronically Signed    TDS/MedQ  DD: 08/03/2007  DT: 08/03/2007  Job #: [id number]   cc:   L. Lupe Carney, M.D.

## 2010-09-13 NOTE — H&P (Signed)
NAME:  [place], [place] Ingram                 ACCOUNT NO.:  [id number]   MEDICAL RECORD NO.:  [id number]          PATIENT TYPE:  INP   LOCATION:  2910                         FACILITY:  [facility]   PHYSICIAN:  Arturo Morton. Riley Kill, MD, FACCDATE OF BIRTH:  1961/03/20   DATE OF ADMISSION:  03/25/2007  DATE OF DISCHARGE:                              HISTORY & PHYSICAL   PRIMARY CARE:  Elsworth Soho, M.D.    [place] is a 50 year old African American gentleman who is brought in  by EMS status post CODE STEMMIE, and is being taken urgently to the  cardiac catheterization lab.  [place] complains of feeling tired and  weak this weekend, with some indigestion-type symptoms.  His family  states that he slept most of the day Sunday.  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  He stated he had  a lot of indigestion and reflux.  He tried some over-the-counter  medicines without relief.  Tonight, he was getting ready for work around  6:30.  He told his wife that his chest was really, really heavy and  tight; she called 9-1-1.  EMS arrived; a 12-lead EKG showed ST  depression in inferior leads with ST elevation in the lateral leads.  EMS gave the patient 4 baby aspirin, nitroglycerin and morphine.  The  patient states his chest pain was an 8 out of 10 after medication.  He  was short of breath, and the patient was placed on a 100% non-rebreather  and transported urgently to the ER.  There he was taken directly to the  catheterization lab at this time.   PAST MEDICAL HISTORY:  1. Hypertension.  The patient apparently is noncompliant with      medications he takes them sporadically.  2. Occasional tobacco use.  3. Occasional EtOH use.  4. He denies any recreational substances.   SURGICAL HISTORY:  1. Thumb surgery.  2. Exploratory laparotomy for hernia (per patient's report).   ALLERGIES:  NO KNOWN DRUG ALLERGIES.   MEDICATIONS:  1. Clonidine 0.3.  2.  Diovan/HCTZ  3. The patient states he is on another medicine, but he cannot recall      what it is.   SOCIAL HISTORY:  He [place] in [place] with [place] wife.  He works third  shift for the post office.  He has a child.  Occasional tobacco use.  Occasional beer.  No diet restrictions.   FAMILY HISTORY:  Mother with irregular heartbeats, atrial fib (per  patient's wife's report).  Father unknown medical history.   REVIEW OF SYSTEMS:  Positive for chest pain, shortness of breath,  generalized weakness and GERD symptoms.   PHYSICAL EXAMINATION:  Heart rate 99, respirations 20, blood pressure  208/166, saturations 95% on 100% non-rebreather.  The patient states his  pain is still at 8/10 at this time,  HEENT: Unremarkable.  NECK: Supple without bruits or JVD.  CARDIOVASCULAR:  Exam reveals a S1-S2; regular rate and rhythm.  LUNGS:  Clear to auscultation.  ABDOMEN: Soft, nontender, positive bowel sounds.  EXTREMITIES:  Lower Extremities without clubbing, cyanosis or edema.  NEUROLOGICAL: He is alert and oriented x3 at this time.   CHEST X-RAY:  Pending.   EKG:  Pending.   LAB WORK:  Pending.   The patient is being taken urgently to the catheterization lab in the  setting of a Code STEMMIE by Dr. Arturo Morton. Stuckey.  The risks and  benefits have been discussed with the patient, and also discussed with  his wife on arrival.      [place], ACNP      Arturo Morton. Riley Kill, MD, [facility]  Electronically Signed    MB/MEDQ  D:  03/25/2007  T:  03/26/2007  Job:  [id number]   cc:   L. Lupe Carney, M.D.

## 2010-09-16 NOTE — Cardiovascular Report (Signed)
NAME:  Lech, Maeson                 ACCOUNT NO.:  [id number]   MEDICAL RECORD NO.:  [id number]          PATIENT TYPE:  OIB   LOCATION:  2899                         FACILITY:  [facility]   PHYSICIAN:  Arturo Morton. Riley Kill, MD, FACCDATE OF BIRTH:  11/14/1960   DATE OF PROCEDURE:  05/08/2008  DATE OF DISCHARGE:                            CARDIAC CATHETERIZATION   INDICATIONS:  Mr. Villavicencio is well-known to me.  He is a 50 year old who  previously presented with anterior wall infarction and had stenting of  the LAD.  He had residual disease.  He has had some intermittent  recurrent chest discomfort, occurring mostly with heavy lifting boxes.  He does not have unstable symptoms.  The current study was done to  reassess coronary anatomy.   PROCEDURE:  1. Left heart catheterization.  2. Selective coronary arteriography.   DESCRIPTION OF PROCEDURE:  The patient was brought to the Cath Lab and  prepped and draped in the usual fashion.  Through an anterior puncture,  the right femoral artery was easily entered.  A 5-French sheath was then  placed.  Views of the left and right coronary arteries were obtained.  Central aortic and left ventricular pressures were measured with a  pigtail.  Ventriculography was not performed because of a borderline  elevation of his creatinine.  He tolerated the procedure without  complication.  He was taken to the holding area in satisfactory clinical  condition.   HEMODYNAMIC DATA:  1. The central aortic pressure was 123/76, mean 96.  2. Left ventricular pressure 139/20.  3. There was no gradient or pullback across the aortic valve.   ANGIOGRAPHIC DATA:  1. The right coronary artery demonstrates diffuse plaquing throughout      the mid and distal vessel.  This would be in the range of about 40-      50% but is evident.  The right terminates as a posterior      descending, small posterolateral system and then tapers in caliber      distally.  2. The left main is  free of critical disease.  3. The LAD at the previous site of stenting is widely patent with less      than 30-40% narrowing at the stent site.  This appears to be well-      healed.  There was no significant compromise of flow.  There is a      diagonal branch that comes off just distal to the stent without      significant narrowing.  There is a ramus intermedius with mild      diffuse luminal irregularity.  4. There is a circumflex that has about 40-50% narrowing in its      proximal segment which is segmental.  The distal vessel is large in      caliber.  The AV circumflex demonstrates about a 50-70% eccentric      plaque, not substantially changed from the previous study supplying      a fairly large posterolateral branch.   Ventriculography was not performed because of mild elevation in  creatinine.  CONCLUSION:  1. Continued patency of the LAD at the prior stent site.  2. Diffuse luminal irregularities of the coronaries with abnormalities      in the first diagonal, the first marginal, the AV circumflex and      diffuse plaquing of the mid right coronary.   RECOMMENDATIONS:  At the present time, we will recommend continued  medical therapy.  I will review this with the patient in the office in  detail.  I plan to review the films with my colleagues as well.      Arturo Morton. Riley Kill, MD, [facility]  Electronically Signed     TDS/MEDQ  D:  05/07/2008  T:  05/08/2008  Job:  [id number]

## 2010-09-21 ENCOUNTER — Encounter: Payer: Self-pay | Admitting: Cardiology

## 2010-09-22 ENCOUNTER — Ambulatory Visit: Payer: Federal, State, Local not specified - PPO | Admitting: Cardiology

## 2010-11-17 ENCOUNTER — Ambulatory Visit: Payer: Federal, State, Local not specified - PPO | Admitting: Cardiology

## 2010-11-18 ENCOUNTER — Telehealth: Payer: Self-pay | Admitting: Cardiology

## 2010-11-18 MED ORDER — NITROGLYCERIN 0.4 MG SL SUBL: 0.4000 mg | SUBLINGUAL_TABLET | SUBLINGUAL | Status: DC | PRN

## 2010-11-18 MED ORDER — PRAVASTATIN SODIUM 80 MG PO TABS: 80.0000 mg | ORAL_TABLET | Freq: Every day | ORAL | Status: DC

## 2010-11-18 NOTE — Telephone Encounter (Signed)
Pt called and stated phar had tried to get refill on Nitro and Pravastatin.   Please call this into [facility] on [address].

## 2010-12-15 ENCOUNTER — Encounter: Payer: Self-pay | Admitting: Cardiology

## 2010-12-15 ENCOUNTER — Ambulatory Visit (INDEPENDENT_AMBULATORY_CARE_PROVIDER_SITE_OTHER): Payer: Federal, State, Local not specified - PPO | Admitting: Cardiology

## 2010-12-15 VITALS — BP 170/110 | HR 59 | Ht 69.0 in | Wt 202.0 lb

## 2010-12-15 DIAGNOSIS — I10 Essential (primary) hypertension: Secondary | ICD-10-CM

## 2010-12-15 DIAGNOSIS — I2510 Atherosclerotic heart disease of native coronary artery without angina pectoris: Secondary | ICD-10-CM

## 2010-12-15 DIAGNOSIS — E785 Hyperlipidemia, unspecified: Secondary | ICD-10-CM

## 2010-12-15 NOTE — Patient Instructions (Signed)
Your physician recommends that you schedule a follow-up appointment in: 6 months with Stuckey  Fasting labs 12/22/10 anytime after 8:30 am.

## 2010-12-17 NOTE — Progress Notes (Signed)
HPI:  Austin Ingram is in for a follow up visit.  He is not having chest pain at the present, but his main complaint is that he cannot stand up due to back pain.  He saw Dr. Clovis Riley for this in the last couple of days.  He said it is due to his job,specifically lifting at the [employer].  Fortunately, he is now working days, but wanted his FMLA filled out today so that he can turn it in.  We did so to the best of our abilitly.  He is on a variety of meds for this, and may have been given both Prednisone as well as some hydrocodone.   He apparently is tapering his Prednisone.  Of note, he is not using CPAP.  Does not check pressure very much, although his wife works in infectious diseases.    Current Outpatient Prescriptions  Medication Sig Dispense Refill  . aliskiren (TEKTURNA) 300 MG tablet Take 300 mg by mouth daily.        Marland Kitchen amLODipine (NORVASC) 10 MG tablet Take 10 mg by mouth daily.        Marland Kitchen aspirin 81 MG tablet Take 81 mg by mouth daily.        . citalopram (CELEXA) 20 MG tablet Take 20 mg by mouth daily.        Marland Kitchen HYDROcodone-acetaminophen (VICODIN) 5-500 MG per tablet Take 1 tablet by mouth Daily.      . nitroGLYCERIN (NITROSTAT) 0.4 MG SL tablet Place 1 tablet (0.4 mg total) under the tongue every 5 (five) minutes as needed.  25 tablet  12  . pravastatin (PRAVACHOL) 80 MG tablet Take 1 tablet (80 mg total) by mouth daily.  30 tablet  12  . predniSONE (DELTASONE) 20 MG tablet As directed      . etodolac (LODINE) 400 MG tablet Take 400 mg by mouth 2 (two) times daily.          No Known Allergies  Past Medical History  Diagnosis Date  . OSA (obstructive sleep apnea)     mild  . Asthma   . Allergic rhinitis   . MI (myocardial infarction)   . Hyperlipidemia   . Hypertension   . Coronary artery disease     statues post percutaneous intervention in November of 08    Past Surgical History  Procedure Date  . Thumb surgery   . Ex lap for hernia     Family History  Problem Relation  Age of Onset  . Atrial fibrillation Mother     irregular heart beats    History   Social History  . Marital Status: Married    Spouse Name: N/A    Number of Children: 1  . Years of Education: N/A   Occupational History  .  [employer]   Social History Main Topics  . Smoking status: Former Games developer  . Smokeless tobacco: Not on file  . Alcohol Use: Not on file  . Drug Use: Not on file  . Sexually Active: Not on file   Other Topics Concern  . Not on file   Social History Narrative  . No narrative on file    ROS: Please see the HPI.  All other systems reviewed and negative.  PHYSICAL EXAM:  BP 170/110  Pulse 59  Ht 5\' 9"  (1.753 m)  Wt 202 lb (91.627 kg)  BMI 29.83 kg/m2  General: Well developed, well nourished, in no acute distress. Head:  Normocephalic and atraumatic.  Neck: no JVD Lungs: Clear to auscultation and percussion. Heart: Normal S1 and S2.  No murmur, rubs or gallops.  Pulses: Pulses normal in all 4 extremities. Extremities: No clubbing or cyanosis. No edema. Neurologic: Alert and oriented x 3.  No neuro exam otherwise done.    EKG:  NSR.   Anteroseptal MI, old with chronic T inversion in V1-V5.  ASSESSMENT AND PLAN:

## 2010-12-19 DIAGNOSIS — I2510 Atherosclerotic heart disease of native coronary artery without angina pectoris: Secondary | ICD-10-CM | POA: Insufficient documentation

## 2010-12-19 NOTE — Assessment & Plan Note (Signed)
These are followed in Dr. Quita Skye office and have been checked.  We do not have the results.

## 2010-12-19 NOTE — Assessment & Plan Note (Signed)
No current symptoms.  His main problem at this point is his back, clearly.  He needs early follow up with Dr. Clovis Riley, and I have encouraged this.

## 2010-12-19 NOTE — Assessment & Plan Note (Signed)
Obviously a point of concern.  He is not willing to pursue evaluation of sleep apnea further, and understands the interaction of this, weight.  His wife is in nursing, and could check BP at home.  This needs better control, and I have given him the numbers  that he needs.  Hopefully, he will get a cuff as I have asked, and his wife will check pressures so that we can make appropriate incursions.  Obviously, his back is bothering him.

## 2010-12-22 ENCOUNTER — Other Ambulatory Visit (INDEPENDENT_AMBULATORY_CARE_PROVIDER_SITE_OTHER): Payer: Federal, State, Local not specified - PPO | Admitting: *Deleted

## 2010-12-22 DIAGNOSIS — I2510 Atherosclerotic heart disease of native coronary artery without angina pectoris: Secondary | ICD-10-CM

## 2010-12-22 LAB — HEPATIC FUNCTION PANEL
ALT: 30 U/L (ref 0–53)
AST: 22 U/L (ref 0–37)
Albumin: 3.8 g/dL (ref 3.5–5.2)
Alkaline Phosphatase: 72 U/L (ref 39–117)
Bilirubin, Direct: 0.1 mg/dL (ref 0.0–0.3)
Total Bilirubin: 0.4 mg/dL (ref 0.3–1.2)
Total Protein: 6.4 g/dL (ref 6.0–8.3)

## 2010-12-22 LAB — LIPID PANEL
Cholesterol: 117 mg/dL (ref 0–200)
HDL: 44.3 mg/dL (ref >39.00)
LDL Cholesterol: 43 mg/dL (ref 0–99)
Total CHOL/HDL Ratio: 3
Triglycerides: 149 mg/dL (ref 0.0–149.0)
VLDL: 29.8 mg/dL (ref 0.0–40.0)

## 2011-02-07 LAB — BASIC METABOLIC PANEL
BUN: 15
BUN: 18
CO2: 25
CO2: 29
Calcium: 8.9
Calcium: 9
Chloride: 104
Chloride: 104
Creatinine, Ser: 1.22
Creatinine, Ser: 1.23
GFR calc Af Amer: >60
GFR calc Af Amer: >60
GFR calc non Af Amer: >60
GFR calc non Af Amer: >60
Glucose, Bld: 163 — ABNORMAL HIGH
Glucose, Bld: 92
Potassium: 3.8
Potassium: 3.9
Sodium: 137
Sodium: 140

## 2011-02-07 LAB — I-STAT 8, (EC8 V) (CONVERTED LAB)
Acid-Base Excess: 3 — ABNORMAL HIGH
BUN: 19
Bicarbonate: 29.7 — ABNORMAL HIGH
Chloride: 103
Glucose, Bld: 134 — ABNORMAL HIGH
HCT: 54 — ABNORMAL HIGH
Hemoglobin: 18.4 — ABNORMAL HIGH
Operator id: 196461
Potassium: 4.1
Sodium: 139
TCO2: 31
pCO2, Ven: 48.5
pH, Ven: 7.394 — ABNORMAL HIGH

## 2011-02-07 LAB — CARDIAC PANEL(CRET KIN+CKTOT+MB+TROPI)
CK, MB: 228.7 — ABNORMAL HIGH
CK, MB: 6.9 — ABNORMAL HIGH
CK, MB: 662.3 — ABNORMAL HIGH
Relative Index: 1.4
Relative Index: 6.4 — ABNORMAL HIGH
Relative Index: 9.6 — ABNORMAL HIGH
Total CK: 3579 — ABNORMAL HIGH
Total CK: 498 — ABNORMAL HIGH
Total CK: 6914 — ABNORMAL HIGH
Troponin I: 31.04
Troponin I: >100
Troponin I: >100

## 2011-02-07 LAB — CBC
HCT: 43
HCT: 43.3
Hemoglobin: 13.4
Hemoglobin: 13.9
MCHC: 31.2
MCHC: 32
MCV: 71.5 — ABNORMAL LOW
MCV: 71.8 — ABNORMAL LOW
Platelets: 175
Platelets: 204
RBC: 6.02 — ABNORMAL HIGH
RBC: 6.02 — ABNORMAL HIGH
RDW: 14.9
RDW: 14.9
WBC: 6.7
WBC: 9.1

## 2011-02-07 LAB — LIPID PANEL
Cholesterol: 168
HDL: 36 — ABNORMAL LOW
LDL Cholesterol: 117 — ABNORMAL HIGH
Total CHOL/HDL Ratio: 4.7
Triglycerides: 76
VLDL: 15

## 2011-02-07 LAB — POCT I-STAT CREATININE
Creatinine, Ser: 1.3
Operator id: 196461

## 2011-02-07 LAB — POCT CARDIAC MARKERS
CKMB, poc: 3.5
Myoglobin, poc: 99.9
Operator id: 196461
Troponin i, poc: 0.4 — ABNORMAL HIGH

## 2011-03-16 ENCOUNTER — Telehealth: Payer: Self-pay | Admitting: Cardiology

## 2011-03-16 NOTE — Telephone Encounter (Signed)
Walk in pt Form " Pt Dropped off FMLA that needs to have Information added" sent to Lauren 03/16/11/km

## 2011-03-17 ENCOUNTER — Telehealth: Payer: Self-pay | Admitting: Cardiology

## 2011-03-17 NOTE — Telephone Encounter (Signed)
F/up to previous problem Pt's wife said paperwork needs some additional info from you Please call

## 2011-03-17 NOTE — Telephone Encounter (Signed)
Paperwork (FMLA forms) corrected and placed at the front desk for pick-up.  Pt's wife aware.

## 2011-06-09 ENCOUNTER — Ambulatory Visit (INDEPENDENT_AMBULATORY_CARE_PROVIDER_SITE_OTHER): Payer: Federal, State, Local not specified - PPO | Admitting: Cardiology

## 2011-06-09 ENCOUNTER — Encounter: Payer: Self-pay | Admitting: Cardiology

## 2011-06-09 VITALS — BP 174/103 | HR 60 | Ht 69.0 in | Wt 201.8 lb

## 2011-06-09 DIAGNOSIS — F518 Other sleep disorders not due to a substance or known physiological condition: Secondary | ICD-10-CM

## 2011-06-09 DIAGNOSIS — I10 Essential (primary) hypertension: Secondary | ICD-10-CM

## 2011-06-09 DIAGNOSIS — I2510 Atherosclerotic heart disease of native coronary artery without angina pectoris: Secondary | ICD-10-CM

## 2011-06-09 DIAGNOSIS — E7800 Pure hypercholesterolemia, unspecified: Secondary | ICD-10-CM

## 2011-06-09 LAB — LIPID PANEL
Cholesterol: 122 mg/dL (ref 0–200)
HDL: 46.4 mg/dL (ref >39.00)
LDL Cholesterol: 68 mg/dL (ref 0–99)
Total CHOL/HDL Ratio: 3
Triglycerides: 36 mg/dL (ref 0.0–149.0)
VLDL: 7.2 mg/dL (ref 0.0–40.0)

## 2011-06-09 LAB — HEPATIC FUNCTION PANEL
ALT: 32 U/L (ref 0–53)
AST: 30 U/L (ref 0–37)
Albumin: 4.2 g/dL (ref 3.5–5.2)
Alkaline Phosphatase: 73 U/L (ref 39–117)
Bilirubin, Direct: 0.1 mg/dL (ref 0.0–0.3)
Total Bilirubin: 0.6 mg/dL (ref 0.3–1.2)
Total Protein: 7.4 g/dL (ref 6.0–8.3)

## 2011-06-09 LAB — BASIC METABOLIC PANEL
BUN: 21 mg/dL (ref 6–23)
CO2: 28 mEq/L (ref 19–32)
Calcium: 9.1 mg/dL (ref 8.4–10.5)
Chloride: 106 mEq/L (ref 96–112)
Creatinine, Ser: 1 mg/dL (ref 0.4–1.5)
GFR: 101.23 mL/min (ref >60.00)
Glucose, Bld: 90 mg/dL (ref 70–99)
Potassium: 3.3 mEq/L — ABNORMAL LOW (ref 3.5–5.1)
Sodium: 142 mEq/L (ref 135–145)

## 2011-06-09 NOTE — Progress Notes (Signed)
   HPI:  He is stable.  He has not taken his medications today. After considerable effort, we finally got him on to days.  However, he has gone back to nights because of all of the drama.  He is not currently having chest pain.    Current Outpatient Prescriptions  Medication Sig Dispense Refill  . aliskiren (TEKTURNA) 300 MG tablet Take 300 mg by mouth daily.        Marland Kitchen amLODipine (NORVASC) 10 MG tablet Take 10 mg by mouth daily.        Marland Kitchen aspirin 81 MG tablet Take 81 mg by mouth daily.        . citalopram (CELEXA) 20 MG tablet Take 20 mg by mouth daily.        Marland Kitchen etodolac (LODINE) 400 MG tablet Take 400 mg by mouth 2 (two) times daily as needed.       . nitroGLYCERIN (NITROSTAT) 0.4 MG SL tablet Place 1 tablet (0.4 mg total) under the tongue every 5 (five) minutes as needed.  25 tablet  12  . pravastatin (PRAVACHOL) 80 MG tablet Take 1 tablet (80 mg total) by mouth daily.  30 tablet  12  . predniSONE (DELTASONE) 20 MG tablet As directed        No Known Allergies  Past Medical History  Diagnosis Date  . OSA (obstructive sleep apnea)     mild  . Asthma   . Allergic rhinitis   . MI (myocardial infarction)   . Hyperlipidemia   . Hypertension   . Coronary artery disease     statues post percutaneous intervention in November of 08    Past Surgical History  Procedure Date  . Thumb surgery   . Ex lap for hernia     Family History  Problem Relation Age of Onset  . Atrial fibrillation Mother     irregular heart beats    History   Social History  . Marital Status: Married    Spouse Name: N/A    Number of Children: 1  . Years of Education: N/A   Occupational History  .  [employer]   Social History Main Topics  . Smoking status: Former Games developer  . Smokeless tobacco: Not on file  . Alcohol Use: Not on file  . Drug Use: Not on file  . Sexually Active: Not on file   Other Topics Concern  . Not on file   Social History Narrative  . No narrative on file    ROS: Please  see the HPI.  All other systems reviewed and negative.  PHYSICAL EXAM:  BP 174/103  Pulse 60  Ht 5\' 9"  (1.753 m)  Wt 201 lb 12.8 oz (91.536 kg)  BMI 29.80 kg/m2  General: Well developed, well nourished, in no acute distress. Head:  Normocephalic and atraumatic. Neck: no JVD Lungs: Clear to auscultation and percussion. Heart: Normal S1 and S2.  No murmur, rubs or gallops.  Abdomen:  Normal bowel sounds; soft; non tender; no organomegaly Pulses: Pulses normal in all 4 extremities. Extremities: No clubbing or cyanosis. No edema. Neurologic: Alert and oriented x 3.  EKG:  SB.  Anterolateral MI, age indeterminate.  Inferior and anterolateral T inversion--prior T inversion I, AVL--prob lead reversal.    ASSESSMENT AND PLAN:

## 2011-06-09 NOTE — Patient Instructions (Signed)
Your physician wants you to follow-up in: 6 months  You will receive a reminder letter in the mail two months in advance. If you don't receive a letter, please call our office to schedule the follow-up appointment.  Your physician recommends that you continue on your current medications as directed. Please refer to the Current Medication list given to you today.  

## 2011-06-11 NOTE — Assessment & Plan Note (Signed)
Not well controlled, but his meds were not taken yet today.  I asked him to have his wife check some of this at home.  He is to give them to Lupe Carney later this week.

## 2011-06-11 NOTE — Assessment & Plan Note (Signed)
Not sure if this is another problem.

## 2011-06-11 NOTE — Assessment & Plan Note (Signed)
No current symptoms. (see ECG and leads).  Continue medical therapy.  Need for risk reduction discussed in detail.

## 2011-07-25 ENCOUNTER — Other Ambulatory Visit: Payer: Self-pay | Admitting: Cardiology

## 2011-10-30 ENCOUNTER — Telehealth: Payer: Self-pay | Admitting: Cardiology

## 2011-10-30 NOTE — Telephone Encounter (Signed)
Pt's calling re length of time pt was given for FMLA , pt said it was 3 months wife thought 1 year, pls call her, requesting after 5p

## 2011-10-30 NOTE — Telephone Encounter (Signed)
I spoke with the pt's wife and made her aware that the FMLA forms that were completed by Dr Riley Kill are good for one year and have to be completed on a yearly basis.  I made her aware that under FMLA the pt is covered for up to 12 weeks of missing work due to his cardiac condition. She will explain this to the patient.

## 2011-11-23 ENCOUNTER — Other Ambulatory Visit: Payer: Self-pay | Admitting: Cardiology

## 2011-12-19 ENCOUNTER — Ambulatory Visit: Payer: Federal, State, Local not specified - PPO | Admitting: Cardiology

## 2011-12-21 ENCOUNTER — Ambulatory Visit (INDEPENDENT_AMBULATORY_CARE_PROVIDER_SITE_OTHER): Payer: Federal, State, Local not specified - PPO | Admitting: Cardiology

## 2011-12-21 ENCOUNTER — Encounter: Payer: Self-pay | Admitting: Cardiology

## 2011-12-21 VITALS — BP 160/102 | HR 62 | Ht 69.0 in | Wt 205.0 lb

## 2011-12-21 DIAGNOSIS — E785 Hyperlipidemia, unspecified: Secondary | ICD-10-CM

## 2011-12-21 DIAGNOSIS — I2510 Atherosclerotic heart disease of native coronary artery without angina pectoris: Secondary | ICD-10-CM

## 2011-12-21 DIAGNOSIS — G4733 Obstructive sleep apnea (adult) (pediatric): Secondary | ICD-10-CM

## 2011-12-21 NOTE — Progress Notes (Signed)
   HPI:  The patient is in for followup. He is doing a lot better with regard to improving his lifestyle. He works nights, but in the middle of the night he will walk around in the parking lot and try to get an irregular walk. He thinks this is been helpful, and he has lost considerable weight since last visit. He otherwise has remained stable. His blood pressure is elevated today, but he is yet to take his medications.  Current Outpatient Prescriptions  Medication Sig Dispense Refill  . aliskiren (TEKTURNA) 300 MG tablet Take 300 mg by mouth daily.        Marland Kitchen amLODipine (NORVASC) 10 MG tablet TAKE ONE TABLET BY MOUTH DAILY  90 tablet  3  . aspirin 81 MG tablet Take 81 mg by mouth daily.        . citalopram (CELEXA) 20 MG tablet Take 20 mg by mouth daily.        Marland Kitchen etodolac (LODINE) 400 MG tablet Take 400 mg by mouth 2 (two) times daily as needed.       . nitroGLYCERIN (NITROSTAT) 0.4 MG SL tablet Place 1 tablet (0.4 mg total) under the tongue every 5 (five) minutes as needed.  25 tablet  12  . pravastatin (PRAVACHOL) 80 MG tablet TAKE 1 TABLET BY MOUTH DAILY  30 tablet  10  . predniSONE (DELTASONE) 20 MG tablet As directed        No Known Allergies  Past Medical History  Diagnosis Date  . OSA (obstructive sleep apnea)     mild  . Asthma   . Allergic rhinitis   . MI (myocardial infarction)   . Hyperlipidemia   . Hypertension   . Coronary artery disease     statues post percutaneous intervention in November of 08    Past Surgical History  Procedure Date  . Thumb surgery   . Ex lap for hernia     Family History  Problem Relation Age of Onset  . Atrial fibrillation Mother     irregular heart beats    History   Social History  . Marital Status: Married    Spouse Name: N/A    Number of Children: 1  . Years of Education: N/A   Occupational History  .  [employer]   Social History Main Topics  . Smoking status: Former Games developer  . Smokeless tobacco: Not on file  .  Alcohol Use: Not on file  . Drug Use: Not on file  . Sexually Active: Not on file   Other Topics Concern  . Not on file   Social History Narrative  . No narrative on file    ROS: Please see the HPI.  All other systems reviewed and negative.  PHYSICAL EXAM:  BP 160/102  Pulse 62  Ht 5\' 9"  (1.753 m)  Wt 205 lb (92.987 kg)  BMI 30.27 kg/m2  General: Well developed, well nourished, in no acute distress. Head:  Normocephalic and atraumatic. Neck: no JVD Lungs: Clear to auscultation and percussion. Heart: Normal S1 and S2.  No murmur, rubs or gallops.  Abdomen:  Normal bowel sounds; soft; non tender; no organomegaly Pulses: Pulses normal in all 4 extremities. Extremities: No clubbing or cyanosis. No edema. Neurologic: Alert and oriented x 3.  EKG:  NSR.  Anteroseptal MI, age indeterminate  ASSESSMENT AND PLAN:

## 2011-12-21 NOTE — Patient Instructions (Addendum)
Your physician wants you to follow-up in: 6 MONTHS with Dr Stuckey.  You will receive a reminder letter in the mail two months in advance. If you don't receive a letter, please call our office to schedule the follow-up appointment.  Your physician recommends that you continue on your current medications as directed. Please refer to the Current Medication list given to you today.  

## 2011-12-30 NOTE — Assessment & Plan Note (Signed)
No current angina 

## 2011-12-30 NOTE — Assessment & Plan Note (Signed)
This may have improved some with his weight loss.  I have encouraged him to continue to lose weight.

## 2011-12-30 NOTE — Assessment & Plan Note (Signed)
Last LDL was at target.   

## 2012-04-11 ENCOUNTER — Emergency Department (HOSPITAL_COMMUNITY)
Admission: EM | Admit: 2012-04-11 | Discharge: 2012-04-11 | Disposition: A | Discharge status: Home / Self Care | Payer: Federal, State, Local not specified - PPO | Attending: Emergency Medicine | Admitting: Emergency Medicine

## 2012-04-11 ENCOUNTER — Emergency Department (HOSPITAL_COMMUNITY): Payer: Federal, State, Local not specified - PPO

## 2012-04-11 ENCOUNTER — Encounter (HOSPITAL_COMMUNITY): Payer: Self-pay | Admitting: Adult Health

## 2012-04-11 DIAGNOSIS — Z7982 Long term (current) use of aspirin: Secondary | ICD-10-CM | POA: Insufficient documentation

## 2012-04-11 DIAGNOSIS — Z79899 Other long term (current) drug therapy: Secondary | ICD-10-CM | POA: Insufficient documentation

## 2012-04-11 DIAGNOSIS — IMO0001 Reserved for inherently not codable concepts without codable children: Secondary | ICD-10-CM | POA: Insufficient documentation

## 2012-04-11 DIAGNOSIS — E785 Hyperlipidemia, unspecified: Secondary | ICD-10-CM | POA: Insufficient documentation

## 2012-04-11 DIAGNOSIS — S199XXA Unspecified injury of neck, initial encounter: Secondary | ICD-10-CM | POA: Insufficient documentation

## 2012-04-11 DIAGNOSIS — I252 Old myocardial infarction: Secondary | ICD-10-CM | POA: Insufficient documentation

## 2012-04-11 DIAGNOSIS — S46909A Unspecified injury of unspecified muscle, fascia and tendon at shoulder and upper arm level, unspecified arm, initial encounter: Secondary | ICD-10-CM | POA: Insufficient documentation

## 2012-04-11 DIAGNOSIS — J45909 Unspecified asthma, uncomplicated: Secondary | ICD-10-CM | POA: Insufficient documentation

## 2012-04-11 DIAGNOSIS — I10 Essential (primary) hypertension: Secondary | ICD-10-CM | POA: Insufficient documentation

## 2012-04-11 DIAGNOSIS — Y92410 Unspecified street and highway as the place of occurrence of the external cause: Secondary | ICD-10-CM | POA: Insufficient documentation

## 2012-04-11 DIAGNOSIS — S0993XA Unspecified injury of face, initial encounter: Secondary | ICD-10-CM | POA: Insufficient documentation

## 2012-04-11 DIAGNOSIS — I2510 Atherosclerotic heart disease of native coronary artery without angina pectoris: Secondary | ICD-10-CM | POA: Insufficient documentation

## 2012-04-11 DIAGNOSIS — Y9389 Activity, other specified: Secondary | ICD-10-CM | POA: Insufficient documentation

## 2012-04-11 DIAGNOSIS — IMO0002 Reserved for concepts with insufficient information to code with codable children: Secondary | ICD-10-CM | POA: Insufficient documentation

## 2012-04-11 DIAGNOSIS — G4733 Obstructive sleep apnea (adult) (pediatric): Secondary | ICD-10-CM | POA: Insufficient documentation

## 2012-04-11 DIAGNOSIS — S4980XA Other specified injuries of shoulder and upper arm, unspecified arm, initial encounter: Secondary | ICD-10-CM | POA: Insufficient documentation

## 2012-04-11 MED ORDER — CYCLOBENZAPRINE HCL 10 MG PO TABS: 10.0000 mg | ORAL_TABLET | Freq: Two times a day (BID) | ORAL | Status: DC | PRN

## 2012-04-11 MED ORDER — HYDROCODONE-ACETAMINOPHEN 5-325 MG PO TABS: 1.0000 | ORAL_TABLET | ORAL | Status: DC | PRN

## 2012-04-11 MED ORDER — OXYCODONE-ACETAMINOPHEN 5-325 MG PO TABS
1.0000 | ORAL_TABLET | Freq: Once | ORAL | Status: AC
  Administered 2012-04-11: 1 via ORAL
  Filled 2012-04-11: qty 1
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
## 2012-04-11 NOTE — ED Provider Notes (Signed)
Medical screening examination/treatment/procedure(s) were performed by non-physician practitioner and as supervising physician I was immediately available for consultation/collaboration.  Matina Rodier, MD 04/11/12 2343 
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
## 2012-04-11 NOTE — ED Notes (Signed)
Patient transported to X-ray 

## 2012-04-23 ENCOUNTER — Other Ambulatory Visit: Payer: Self-pay | Admitting: Cardiology

## 2012-04-23 MED ORDER — NITROGLYCERIN 0.4 MG SL SUBL: 0.4000 mg | SUBLINGUAL_TABLET | SUBLINGUAL | Status: DC | PRN

## 2012-07-03 ENCOUNTER — Ambulatory Visit (INDEPENDENT_AMBULATORY_CARE_PROVIDER_SITE_OTHER): Payer: Federal, State, Local not specified - PPO | Admitting: Cardiology

## 2012-07-03 ENCOUNTER — Encounter: Payer: Self-pay | Admitting: Cardiology

## 2012-07-03 VITALS — BP 160/90 | HR 54 | Ht 69.0 in | Wt 207.0 lb

## 2012-07-03 DIAGNOSIS — E785 Hyperlipidemia, unspecified: Secondary | ICD-10-CM

## 2012-07-03 DIAGNOSIS — I10 Essential (primary) hypertension: Secondary | ICD-10-CM

## 2012-07-03 DIAGNOSIS — I2510 Atherosclerotic heart disease of native coronary artery without angina pectoris: Secondary | ICD-10-CM

## 2012-07-03 NOTE — Patient Instructions (Addendum)
Your physician recommends that you continue on your current medications as directed. Please refer to the Current Medication list given to you today.  Your physician recommends that you return for a FASTING LIPID, LIVER and BMP--nothing to eat or drink after midnight, lab opens at 7:30 (07/09/12)  Your physician recommends that you schedule a follow-up appointment in: 6 WEEKS with Dr Clifton James (previous pt of Dr Riley Kill)

## 2012-07-04 NOTE — Progress Notes (Signed)
   HPI:  This very nice patient is in for followup. He has returned to working nights. Overall, he appears to be getting along really pretty well.  Current Outpatient Prescriptions  Medication Sig Dispense Refill  . aliskiren (TEKTURNA) 300 MG tablet Take 300 mg by mouth daily.        Marland Kitchen amLODipine (NORVASC) 10 MG tablet TAKE ONE TABLET BY MOUTH DAILY  90 tablet  3  . aspirin 81 MG tablet Take 81 mg by mouth daily.        . citalopram (CELEXA) 20 MG tablet Take 20 mg by mouth daily.        . cyclobenzaprine (FLEXERIL) 10 MG tablet Take 1 tablet (10 mg total) by mouth 2 (two) times daily as needed for muscle spasms.  20 tablet  0  . HYDROcodone-acetaminophen (NORCO/VICODIN) 5-325 MG per tablet Take 1 tablet by mouth every 4 (four) hours as needed for pain.  15 tablet  0  . ibuprofen (ADVIL,MOTRIN) 200 MG tablet Take 200 mg by mouth every 6 (six) hours as needed. For pain      . nitroGLYCERIN (NITROSTAT) 0.4 MG SL tablet Place 1 tablet (0.4 mg total) under the tongue every 5 (five) minutes as needed.  25 tablet  11  . pravastatin (PRAVACHOL) 80 MG tablet TAKE 1 TABLET BY MOUTH DAILY  30 tablet  10   No current facility-administered medications for this visit.    No Known Allergies  Past Medical History  Diagnosis Date  . OSA (obstructive sleep apnea)     mild  . Asthma   . Allergic rhinitis   . MI (myocardial infarction)   . Hyperlipidemia   . Hypertension   . Coronary artery disease     statues post percutaneous intervention in November of 08    Past Surgical History  Procedure Laterality Date  . Thumb surgery    . Ex lap for hernia      Family History  Problem Relation Age of Onset  . Atrial fibrillation Mother     irregular heart beats    History   Social History  . Marital Status: Married    Spouse Name: N/A    Number of Children: 1  . Years of Education: N/A   Occupational History  .  [employer]   Social History Main Topics  . Smoking status: Former  Games developer  . Smokeless tobacco: Not on file  . Alcohol Use: Not on file  . Drug Use: Not on file  . Sexually Active: Not on file   Other Topics Concern  . Not on file   Social History Narrative  . No narrative on file    ROS: Please see the HPI.  All other systems reviewed and negative.  PHYSICAL EXAM:  BP 160/90  Pulse 54  Ht 5\' 9"  (1.753 m)  Wt 207 lb (93.895 kg)  BMI 30.55 kg/m2  SpO2 99%  General: Well developed, well nourished, in no acute distress. Head:  Normocephalic and atraumatic. Neck: no JVD Lungs: Clear to auscultation and percussion. Heart: Normal S1 and S2.  No murmur, rubs or gallops.  Abdomen:  Normal bowel sounds; soft; non tender; no organomegaly Pulses: Pulses normal in all 4 extremities. Extremities: No clubbing or cyanosis. No edema. Neurologic: Alert and oriented x 3.  EKG:  NSR.  Anterior MI, age indeterminate.  Tracing similar to prior with chronic T wave change laterally.    ASSESSMENT AND PLAN:

## 2012-07-05 NOTE — Assessment & Plan Note (Addendum)
Not all that well controlled.  I have asked him to get a BP cuff and measure so that accurate measurements can be taken.  Could be related to OSA as well.

## 2012-07-05 NOTE — Assessment & Plan Note (Addendum)
Has had pretty good control on medical therapy.  He will get a lipid and liver profile.

## 2012-07-05 NOTE — Assessment & Plan Note (Signed)
He continues to remain stable at the present time.  It would be better for him not to work nights, but it just works out for him.  Continue medical therapy

## 2012-07-09 ENCOUNTER — Other Ambulatory Visit: Payer: Federal, State, Local not specified - PPO

## 2012-07-10 ENCOUNTER — Telehealth: Payer: Self-pay | Admitting: Cardiology

## 2012-07-10 NOTE — Telephone Encounter (Signed)
Left Message on Both Home & Cell letting Pt Know FMLA Ready For Pick UP Wasn't Sure If he Wanted To pick up or if it Needed Something Else 07/10/12/KM

## 2012-07-12 ENCOUNTER — Telehealth: Payer: Self-pay | Admitting: Cardiology

## 2012-07-12 NOTE — Telephone Encounter (Signed)
Pt Picked up [facility] 07/12/12/KM

## 2012-08-06 ENCOUNTER — Other Ambulatory Visit: Payer: Self-pay | Admitting: *Deleted

## 2012-08-06 MED ORDER — AMLODIPINE BESYLATE 10 MG PO TABS: 10.0000 mg | ORAL_TABLET | Freq: Every day | ORAL | Status: DC

## 2012-08-27 ENCOUNTER — Ambulatory Visit (INDEPENDENT_AMBULATORY_CARE_PROVIDER_SITE_OTHER): Payer: Federal, State, Local not specified - PPO | Admitting: Cardiovascular Disease

## 2012-08-27 ENCOUNTER — Encounter: Payer: Self-pay | Admitting: Cardiovascular Disease

## 2012-08-27 VITALS — BP 148/82 | HR 53 | Ht 69.0 in | Wt 209.0 lb

## 2012-08-27 DIAGNOSIS — I2510 Atherosclerotic heart disease of native coronary artery without angina pectoris: Secondary | ICD-10-CM

## 2012-08-27 DIAGNOSIS — I10 Essential (primary) hypertension: Secondary | ICD-10-CM

## 2012-08-27 DIAGNOSIS — E785 Hyperlipidemia, unspecified: Secondary | ICD-10-CM

## 2012-08-27 MED ORDER — HYDROCHLOROTHIAZIDE 25 MG PO TABS: 25.0000 mg | ORAL_TABLET | Freq: Every day | ORAL | Status: DC

## 2012-08-27 NOTE — Patient Instructions (Addendum)
Your physician recommends that you schedule a follow-up appointment in:  3-4 months.  Your physician has recommended you make the following change in your medication:  Start hydrochlorothiazide 25 mg by mouth daily.  Your physician recommends that you return for fasting lab work later this week or next week--Lipid and Liver profile.  The lab opens at 7:30 AM every week day

## 2012-08-27 NOTE — Progress Notes (Signed)
History of Present Illness: 52 yo male with history of CAD, HTN, HLD who is here today for routine cardiac follow up. He has been followed in the past by Dr. Riley Kill. Cath November 2008 per Dr. Riley Kill and LAD was totally occluded in the mid vessel. A 2.5 x 15 mm Mini-Vision bare metal stent was placed in the mid LAD. This was post-dilated with a 3.0 [place] balloon. He was also noted to have 50% mid RCA, 50% mid PDA, 40% OM1, 50% mid Circumflex stenosis. Last cath January 2010 with patent LAD stent, moderate RCA and Circumflex disease.   He tells me today that he has been feeling well. No chest pain or SOB.   Primary Care Physician: Benita Stabile  Last Lipid Profile:Lipid Panel     Component Value Date/Time   CHOL 122 06/09/2011 1114   TRIG 36.0 06/09/2011 1114   HDL 46.40 06/09/2011 1114   CHOLHDL 3 06/09/2011 1114   VLDL 7.2 06/09/2011 1114   LDLCALC 68 06/09/2011 1114     Past Medical History  Diagnosis Date  . OSA (obstructive sleep apnea)     mild  . Asthma   . Allergic rhinitis   . MI (myocardial infarction)   . Hyperlipidemia   . Hypertension   . Coronary artery disease     statues post percutaneous intervention in November of 08    Past Surgical History  Procedure Laterality Date  . Thumb surgery    . Ex lap for hernia      Current Outpatient Prescriptions  Medication Sig Dispense Refill  . aliskiren (TEKTURNA) 300 MG tablet Take 300 mg by mouth daily.        Marland Kitchen amLODipine (NORVASC) 10 MG tablet Take 1 tablet (10 mg total) by mouth daily.  90 tablet  3  . aspirin 81 MG tablet Take 81 mg by mouth daily.        . citalopram (CELEXA) 20 MG tablet Take 20 mg by mouth daily.        . cyclobenzaprine (FLEXERIL) 10 MG tablet Take 1 tablet (10 mg total) by mouth 2 (two) times daily as needed for muscle spasms.  20 tablet  0  . HYDROcodone-acetaminophen (NORCO/VICODIN) 5-325 MG per tablet Take 1 tablet by mouth every 4 (four) hours as needed for pain.  15 tablet  0  . ibuprofen  (ADVIL,MOTRIN) 200 MG tablet Take 200 mg by mouth every 6 (six) hours as needed. For pain      . nitroGLYCERIN (NITROSTAT) 0.4 MG SL tablet Place 1 tablet (0.4 mg total) under the tongue every 5 (five) minutes as needed.  25 tablet  11  . pravastatin (PRAVACHOL) 80 MG tablet TAKE 1 TABLET BY MOUTH DAILY  30 tablet  10   No current facility-administered medications for this visit.    No Known Allergies  History   Social History  . Marital Status: Married    Spouse Name: N/A    Number of Children: 1  . Years of Education: N/A   Occupational History  .  [employer]   Social History Main Topics  . Smoking status: Current Every Day Smoker  . Smokeless tobacco: Not on file     Comment: pt states he recently started  . Alcohol Use: Not on file  . Drug Use: Not on file  . Sexually Active: Not on file   Other Topics Concern  . Not on file   Social History Narrative  . No narrative  on file    Family History  Problem Relation Age of Onset  . Atrial fibrillation Mother     irregular heart beats    Review of Systems:  As stated in the HPI and otherwise negative.   BP 148/82  Pulse 53  Ht 5\' 9"  (1.753 m)  Wt 209 lb (94.802 kg)  BMI 30.85 kg/m2  Physical Examination: General: Well developed, well nourished, NAD HEENT: OP clear, mucus membranes moist SKIN: warm, dry. No rashes. Neuro: No focal deficits Musculoskeletal: Muscle strength 5/5 all ext Psychiatric: Mood and affect normal Neck: No JVD, no carotid bruits, no thyromegaly, no lymphadenopathy. Lungs:Clear bilaterally, no wheezes, rhonci, crackles Cardiovascular: Regular rate and rhythm. No murmurs, gallops or rubs. Abdomen:Soft. Bowel sounds present. Non-tender.  Extremities: No lower extremity edema. Pulses are 2 + in the bilateral DP/PT.  Cardiac cath  05/08/08: 1. The right coronary artery demonstrates diffuse plaquing throughout  the mid and distal vessel. This would be in the range of about 40-  50% but  is evident. The right terminates as a posterior  descending, small posterolateral system and then tapers in caliber  distally.  2. The left main is free of critical disease.  3. The LAD at the previous site of stenting is widely patent with less  than 30-40% narrowing at the stent site. This appears to be well-  healed. There was no significant compromise of flow. There is a  diagonal branch that comes off just distal to the stent without  significant narrowing. There is a ramus intermedius with mild  diffuse luminal irregularity.  4. There is a circumflex that has about 40-50% narrowing in its  proximal segment which is segmental. The distal vessel is large in  caliber. The AV circumflex demonstrates about a 50-70% eccentric  plaque, not substantially changed from the previous study supplying  a fairly large posterolateral branch.  Ventriculography was not performed because of mild elevation in  creatinine.  CONCLUSION:  1. Continued patency of the LAD at the prior stent site.  2. Diffuse luminal irregularities of the coronaries with abnormalities  in the first diagonal, the first marginal, the AV circumflex and  diffuse plaquing of the mid right coronary.    Assessment and Plan:   1. Coronary artery disease: Stable. No changes today. Will arrange treadmill stress test at next visit.   2. HYPERLIPIDEMIA:  Continue statin. Needs repeat lipids and LFTs.       3. HYPERTENSION: Elevated. Will add HCTZ 25 mg po QDaily.  Continue Tekturna and Norvasc. He will follow at home.

## 2012-08-30 ENCOUNTER — Other Ambulatory Visit: Payer: Federal, State, Local not specified - PPO

## 2012-09-03 ENCOUNTER — Other Ambulatory Visit (INDEPENDENT_AMBULATORY_CARE_PROVIDER_SITE_OTHER): Payer: Federal, State, Local not specified - PPO

## 2012-09-03 DIAGNOSIS — I2510 Atherosclerotic heart disease of native coronary artery without angina pectoris: Secondary | ICD-10-CM

## 2012-09-03 DIAGNOSIS — E785 Hyperlipidemia, unspecified: Secondary | ICD-10-CM

## 2012-09-03 DIAGNOSIS — I10 Essential (primary) hypertension: Secondary | ICD-10-CM

## 2012-09-03 LAB — LIPID PANEL
Cholesterol: 136 mg/dL (ref 0–200)
HDL: 43.2 mg/dL (ref >39.00)
LDL Cholesterol: 74 mg/dL (ref 0–99)
Total CHOL/HDL Ratio: 3
Triglycerides: 95 mg/dL (ref 0.0–149.0)
VLDL: 19 mg/dL (ref 0.0–40.0)

## 2012-09-03 LAB — BASIC METABOLIC PANEL
BUN: 20 mg/dL (ref 6–23)
CO2: 29 mEq/L (ref 19–32)
Calcium: 9.2 mg/dL (ref 8.4–10.5)
Chloride: 105 mEq/L (ref 96–112)
Creatinine, Ser: 1.3 mg/dL (ref 0.4–1.5)
GFR: 78.02 mL/min (ref >60.00)
Glucose, Bld: 86 mg/dL (ref 70–99)
Potassium: 3.9 mEq/L (ref 3.5–5.1)
Sodium: 139 mEq/L (ref 135–145)

## 2012-09-03 LAB — HEPATIC FUNCTION PANEL
ALT: 34 U/L (ref 0–53)
AST: 29 U/L (ref 0–37)
Albumin: 4.3 g/dL (ref 3.5–5.2)
Alkaline Phosphatase: 74 U/L (ref 39–117)
Bilirubin, Direct: 0 mg/dL (ref 0.0–0.3)
Total Bilirubin: 0.4 mg/dL (ref 0.3–1.2)
Total Protein: 7.4 g/dL (ref 6.0–8.3)

## 2012-09-06 ENCOUNTER — Telehealth: Payer: Self-pay | Admitting: Cardiovascular Disease

## 2012-09-06 DIAGNOSIS — I2510 Atherosclerotic heart disease of native coronary artery without angina pectoris: Secondary | ICD-10-CM

## 2012-09-06 NOTE — Telephone Encounter (Signed)
Spoke with pt and reviewed lab results with him. He will come in for BMP the first week in June.

## 2012-09-27 ENCOUNTER — Telehealth: Payer: Self-pay | Admitting: Cardiovascular Disease

## 2012-09-27 NOTE — Telephone Encounter (Signed)
Spoke with pt's wife. She reports pt has not been feeling well for last week. Very fatigued. Has used NTG frequently but wife does not know how often.  She works days and pt works nights. She states pt has been going to work. She thinks fatigue started after HCTZ started. Wife reports pt states he feels very tired when walking.  She does not know how much chest pain he has been having. Wife is requesting office visit for pt sometime soon.

## 2012-09-27 NOTE — Telephone Encounter (Signed)
I would have him come in next week to see me or if he has recurrent pain, go to ED. chris

## 2012-09-27 NOTE — Telephone Encounter (Signed)
I spoke with pt's wife and gave her information from Dr. Clifton James. Appt made for pt to see Dr. Clifton James on October 02, 2012 at 1:30.  Lab work scheduled for October 01, 2012 will be done at this appt.  Wife aware if pt has recurrent chest pain or increase in symptoms he should go to ED at [facility].

## 2012-09-27 NOTE — Telephone Encounter (Signed)
New problem  Pt's wife states pt said he is having to take his Nitro a little more than normal lately. He states he is not really have chest pain but he is not feeling well. Pt is schedule for lab work on 6.3.14.

## 2012-10-01 ENCOUNTER — Other Ambulatory Visit: Payer: Federal, State, Local not specified - PPO

## 2012-10-02 ENCOUNTER — Other Ambulatory Visit: Payer: Self-pay | Admitting: *Deleted

## 2012-10-02 ENCOUNTER — Encounter: Payer: Self-pay | Admitting: *Deleted

## 2012-10-02 ENCOUNTER — Ambulatory Visit (INDEPENDENT_AMBULATORY_CARE_PROVIDER_SITE_OTHER): Payer: Federal, State, Local not specified - PPO | Admitting: Cardiovascular Disease

## 2012-10-02 ENCOUNTER — Encounter: Payer: Self-pay | Admitting: Cardiovascular Disease

## 2012-10-02 VITALS — BP 138/82 | HR 48 | Ht 69.0 in | Wt 209.0 lb

## 2012-10-02 DIAGNOSIS — I10 Essential (primary) hypertension: Secondary | ICD-10-CM

## 2012-10-02 DIAGNOSIS — I2510 Atherosclerotic heart disease of native coronary artery without angina pectoris: Secondary | ICD-10-CM

## 2012-10-02 DIAGNOSIS — R079 Chest pain, unspecified: Secondary | ICD-10-CM

## 2012-10-02 LAB — CBC WITH DIFFERENTIAL/PLATELET
Basophils Absolute: 0 10*3/uL (ref 0.0–0.1)
Basophils Relative: 0.4 % (ref 0.0–3.0)
Eosinophils Absolute: 0.4 10*3/uL (ref 0.0–0.7)
Eosinophils Relative: 5.9 % — ABNORMAL HIGH (ref 0.0–5.0)
HCT: 42.9 % (ref 39.0–52.0)
Hemoglobin: 13.3 g/dL (ref 13.0–17.0)
Lymphocytes Relative: 38.8 % (ref 12.0–46.0)
Lymphs Abs: 2.5 10*3/uL (ref 0.7–4.0)
MCHC: 31 g/dL (ref 30.0–36.0)
MCV: 73.9 fl — ABNORMAL LOW (ref 78.0–100.0)
Monocytes Absolute: 0.6 10*3/uL (ref 0.1–1.0)
Monocytes Relative: 9.8 % (ref 3.0–12.0)
Neutro Abs: 2.9 10*3/uL (ref 1.4–7.7)
Neutrophils Relative %: 45.1 % (ref 43.0–77.0)
Platelets: 189 10*3/uL (ref 150.0–400.0)
RBC: 5.8 Mil/uL (ref 4.22–5.81)
RDW: 15.1 % — ABNORMAL HIGH (ref 11.5–14.6)
WBC: 6.4 10*3/uL (ref 4.5–10.5)

## 2012-10-02 LAB — PROTIME-INR
INR: 1.1 ratio — ABNORMAL HIGH (ref 0.8–1.0)
Prothrombin Time: 11.1 s (ref 10.2–12.4)

## 2012-10-02 LAB — BASIC METABOLIC PANEL
BUN: 19 mg/dL (ref 6–23)
CO2: 29 mEq/L (ref 19–32)
Calcium: 9.3 mg/dL (ref 8.4–10.5)
Chloride: 106 mEq/L (ref 96–112)
Creatinine, Ser: 1.1 mg/dL (ref 0.4–1.5)
GFR: 91.23 mL/min (ref >60.00)
Glucose, Bld: 86 mg/dL (ref 70–99)
Potassium: 3.2 mEq/L — ABNORMAL LOW (ref 3.5–5.1)
Sodium: 144 mEq/L (ref 135–145)

## 2012-10-02 MED ORDER — NITROGLYCERIN 0.4 MG SL SUBL: 0.4000 mg | SUBLINGUAL_TABLET | SUBLINGUAL | Status: DC | PRN

## 2012-10-02 MED ORDER — POTASSIUM CHLORIDE CRYS ER 20 MEQ PO TBCR: EXTENDED_RELEASE_TABLET | ORAL | Status: DC

## 2012-10-02 NOTE — Progress Notes (Signed)
History of Present Illness: 52 yo male with history of CAD, HTN, HLD who is here today for cardiac follow up. He has been followed in the past by Dr. Riley Kill. Cath November 2008 per Dr. Riley Kill and LAD was totally occluded in the mid vessel. A 2.5 x 15 mm Mini-Vision bare metal stent was placed in the mid LAD. This was post-dilated with a 3.0 [place] balloon. He was also noted to have 50% mid RCA, 50% mid PDA, 40% OM1, 50% mid Circumflex stenosis. Last cath January 2010 with patent LAD stent, moderate RCA and Circumflex disease. I last saw him in April 2014 and he was doing well.   He is added onto the schedule today because of recent chest pains. He describes squeezing in his chest with normal tasks around the house. There is associated diaphoresis, SOB. This has been occurring for 4-6 weeks. He is using frequent NTG SL. He had one episode of chest pain last week while having sex.    Primary Care Physician: Benita Stabile  Last Lipid Profile:Lipid Panel     Component Value Date/Time   CHOL 136 09/03/2012 0951   TRIG 95.0 09/03/2012 0951   HDL 43.20 09/03/2012 0951   CHOLHDL 3 09/03/2012 0951   VLDL 19.0 09/03/2012 0951   LDLCALC 74 09/03/2012 0951     Past Medical History  Diagnosis Date  . OSA (obstructive sleep apnea)     mild  . Asthma   . Allergic rhinitis   . MI (myocardial infarction)   . Hyperlipidemia   . Hypertension   . Coronary artery disease     statues post percutaneous intervention in November of 08    Past Surgical History  Procedure Laterality Date  . Thumb surgery    . Ex lap for hernia      Current Outpatient Prescriptions  Medication Sig Dispense Refill  . aliskiren (TEKTURNA) 300 MG tablet Take 300 mg by mouth daily.        Marland Kitchen amLODipine (NORVASC) 10 MG tablet Take 1 tablet (10 mg total) by mouth daily.  90 tablet  3  . aspirin 81 MG tablet Take 81 mg by mouth daily.        . citalopram (CELEXA) 20 MG tablet Take 20 mg by mouth daily.        . cyclobenzaprine  (FLEXERIL) 10 MG tablet Take 1 tablet (10 mg total) by mouth 2 (two) times daily as needed for muscle spasms.  20 tablet  0  . hydrochlorothiazide (HYDRODIURIL) 25 MG tablet Take 1 tablet (25 mg total) by mouth daily.  30 tablet  6  . HYDROcodone-acetaminophen (NORCO/VICODIN) 5-325 MG per tablet Take 1 tablet by mouth every 4 (four) hours as needed for pain.  15 tablet  0  . ibuprofen (ADVIL,MOTRIN) 200 MG tablet Take 200 mg by mouth every 6 (six) hours as needed. For pain      . nitroGLYCERIN (NITROSTAT) 0.4 MG SL tablet Place 1 tablet (0.4 mg total) under the tongue every 5 (five) minutes as needed.  25 tablet  11  . pravastatin (PRAVACHOL) 80 MG tablet TAKE 1 TABLET BY MOUTH DAILY  30 tablet  10   No current facility-administered medications for this visit.    No Known Allergies  History   Social History  . Marital Status: Married    Spouse Name: N/A    Number of Children: 1  . Years of Education: N/A   Occupational History  .  [employer]   Social History Main Topics  . Smoking status: Never Smoker   . Smokeless tobacco: Not on file     Comment: pt states he recently started  . Alcohol Use: 0.5 oz/week    1 drink(s) per week  . Drug Use: No  . Sexually Active: Not on file   Other Topics Concern  . Not on file   Social History Narrative  . No narrative on file    Family History  Problem Relation Age of Onset  . Atrial fibrillation Mother     irregular heart beats  . CAD Maternal Grandfather   . CAD Maternal Grandmother     Review of Systems:  As stated in the HPI and otherwise negative.   BP 138/82  Pulse 48  Ht 5\' 9"  (1.753 m)  Wt 209 lb (94.802 kg)  BMI 30.85 kg/m2  Physical Examination: General: Well developed, well nourished, NAD HEENT: OP clear, mucus membranes moist SKIN: warm, dry. No rashes. Neuro: No focal deficits Musculoskeletal: Muscle strength 5/5 all ext Psychiatric: Mood and affect normal Neck: No JVD, no carotid bruits, no  thyromegaly, no lymphadenopathy. Lungs:Clear bilaterally, no wheezes, rhonci, crackles Cardiovascular: Regular, brady. No murmurs, gallops or rubs. Abdomen:Soft. Bowel sounds present. Non-tender.  Extremities: No lower extremity edema. Pulses are 2 + in the bilateral DP/PT.  EKG: Sinus brady, rate 48 bpm. 1st degree AV block. Old inferior MI. ST elevation precordial leads, unchanged. T wave abnormality in lateral leads, unchanged.   Cardiac cath 05/08/08:  1. The right coronary artery demonstrates diffuse plaquing throughout  the mid and distal vessel. This would be in the range of about 40-  50% but is evident. The right terminates as a posterior  descending, small posterolateral system and then tapers in caliber  distally.  2. The left main is free of critical disease.  3. The LAD at the previous site of stenting is widely patent with less  than 30-40% narrowing at the stent site. This appears to be well-  healed. There was no significant compromise of flow. There is a  diagonal branch that comes off just distal to the stent without  significant narrowing. There is a ramus intermedius with mild  diffuse luminal irregularity.  4. There is a circumflex that has about 40-50% narrowing in its  proximal segment which is segmental. The distal vessel is large in  caliber. The AV circumflex demonstrates about a 50-70% eccentric  plaque, not substantially changed from the previous study supplying  a fairly large posterolateral branch.  Ventriculography was not performed because of mild elevation in  creatinine.  CONCLUSION:  1. Continued patency of the LAD at the prior stent site.  2. Diffuse luminal irregularities of the coronaries with abnormalities  in the first diagonal, the first marginal, the AV circumflex and  diffuse plaquing of the mid right coronary.   Assessment and Plan:   1. Coronary artery disease: Recent chest pain with minimal exertion consistent with unstable angina. His  last cath was in January 2010 and he had moderate disease in the RCA and Circumflex at that time. His LAD stent had mild to moderate restenosis. Will arrange cardiac cath with possible PCI on 10/04/12 in the main cath lab with Dr. Excell Seltzer. Will arrange BMET, CBC and INR today. Risks and benefits reviewed with pt and he agrees to proceed.   2. HYPERLIPIDEMIA: Continue statin. Lipids well controlled. LFTs normal.   3. HYPERTENSION: BP controlled. Continue Tekturna, HCTZ and Norvasc.

## 2012-10-02 NOTE — Patient Instructions (Signed)
Your physician recommends that you schedule a follow-up appointment in:  4-5 weeks.   Your physician has requested that you have a cardiac catheterization. Cardiac catheterization is used to diagnose and/or treat various heart conditions. Doctors may recommend this procedure for a number of different reasons. The most common reason is to evaluate chest pain. Chest pain can be a symptom of coronary artery disease (CAD), and cardiac catheterization can show whether plaque is narrowing or blocking your heart's arteries. This procedure is also used to evaluate the valves, as well as measure the blood flow and oxygen levels in different parts of your heart. For further information please visit [email or web address] Please follow instruction sheet, as given. Scheduled for October 04, 2012

## 2012-10-04 ENCOUNTER — Encounter (HOSPITAL_COMMUNITY)
Admission: RE | Disposition: A | Discharge status: Home / Self Care | Payer: Self-pay | Source: Ambulatory Visit | Attending: Cardiovascular Disease

## 2012-10-04 ENCOUNTER — Encounter (HOSPITAL_COMMUNITY): Payer: Self-pay | Admitting: Pharmacy Technician

## 2012-10-04 ENCOUNTER — Encounter (HOSPITAL_COMMUNITY): Payer: Self-pay | Admitting: General Practice

## 2012-10-04 ENCOUNTER — Ambulatory Visit (HOSPITAL_COMMUNITY)
Admission: RE | Admit: 2012-10-04 | Discharge: 2012-10-05 | Disposition: A | Discharge status: Home / Self Care | Payer: Federal, State, Local not specified - PPO | Source: Ambulatory Visit | Attending: Cardiovascular Disease | Admitting: Cardiovascular Disease

## 2012-10-04 DIAGNOSIS — I200 Unstable angina: Secondary | ICD-10-CM | POA: Insufficient documentation

## 2012-10-04 DIAGNOSIS — I2589 Other forms of chronic ischemic heart disease: Secondary | ICD-10-CM | POA: Insufficient documentation

## 2012-10-04 DIAGNOSIS — Z955 Presence of coronary angioplasty implant and graft: Secondary | ICD-10-CM

## 2012-10-04 DIAGNOSIS — I2510 Atherosclerotic heart disease of native coronary artery without angina pectoris: Secondary | ICD-10-CM | POA: Diagnosis present

## 2012-10-04 DIAGNOSIS — Z79899 Other long term (current) drug therapy: Secondary | ICD-10-CM | POA: Insufficient documentation

## 2012-10-04 DIAGNOSIS — I2582 Chronic total occlusion of coronary artery: Secondary | ICD-10-CM | POA: Insufficient documentation

## 2012-10-04 DIAGNOSIS — I10 Essential (primary) hypertension: Secondary | ICD-10-CM | POA: Diagnosis present

## 2012-10-04 DIAGNOSIS — I208 Other forms of angina pectoris: Secondary | ICD-10-CM | POA: Diagnosis present

## 2012-10-04 DIAGNOSIS — I252 Old myocardial infarction: Secondary | ICD-10-CM | POA: Insufficient documentation

## 2012-10-04 DIAGNOSIS — E785 Hyperlipidemia, unspecified: Secondary | ICD-10-CM | POA: Diagnosis present

## 2012-10-04 DIAGNOSIS — R0602 Shortness of breath: Secondary | ICD-10-CM | POA: Insufficient documentation

## 2012-10-04 DIAGNOSIS — I2089 Other forms of angina pectoris: Secondary | ICD-10-CM | POA: Diagnosis present

## 2012-10-04 HISTORY — DX: Unspecified osteoarthritis, unspecified site: M19.90

## 2012-10-04 HISTORY — DX: Major depressive disorder, single episode, unspecified: F32.9

## 2012-10-04 HISTORY — DX: Depression, unspecified: F32.A

## 2012-10-04 HISTORY — DX: Pneumonia, unspecified organism: J18.9

## 2012-10-04 HISTORY — PX: LEFT HEART CATHETERIZATION WITH CORONARY ANGIOGRAM: SHX5451

## 2012-10-04 HISTORY — DX: Anxiety disorder, unspecified: F41.9

## 2012-10-04 LAB — POCT ACTIVATED CLOTTING TIME: Activated Clotting Time: 459 s

## 2012-10-04 LAB — POTASSIUM: Potassium: 3.5 mEq/L (ref 3.5–5.1)

## 2012-10-04 SURGERY — LEFT HEART CATHETERIZATION WITH CORONARY ANGIOGRAM
Anesthesia: LOCAL

## 2012-10-04 MED ORDER — SODIUM CHLORIDE 0.9 % IV SOLN
INTRAVENOUS | Status: DC
  Administered 2012-10-04: 09:00:00 via INTRAVENOUS

## 2012-10-04 MED ORDER — ACETAMINOPHEN 325 MG PO TABS: 650.0000 mg | ORAL_TABLET | ORAL | Status: DC | PRN

## 2012-10-04 MED ORDER — BIVALIRUDIN 250 MG IV SOLR
INTRAVENOUS | Status: AC
  Filled 2012-10-04: qty 250

## 2012-10-04 MED ORDER — AMLODIPINE BESYLATE 10 MG PO TABS
10.0000 mg | ORAL_TABLET | Freq: Every day | ORAL | Status: DC
  Filled 2012-10-04: qty 1

## 2012-10-04 MED ORDER — SIMVASTATIN 20 MG PO TABS
20.0000 mg | ORAL_TABLET | Freq: Every day | ORAL | Status: DC
  Administered 2012-10-04: 20 mg via ORAL
  Filled 2012-10-04 (×2): qty 1

## 2012-10-04 MED ORDER — SODIUM CHLORIDE 0.9 % IJ SOLN
3.0000 mL | Freq: Two times a day (BID) | INTRAMUSCULAR | Status: DC
  Administered 2012-10-04: 3 mL via INTRAVENOUS

## 2012-10-04 MED ORDER — FENTANYL CITRATE 0.05 MG/ML IJ SOLN
INTRAMUSCULAR | Status: AC
  Filled 2012-10-04: qty 2

## 2012-10-04 MED ORDER — SODIUM CHLORIDE 0.9 % IJ SOLN: 3.0000 mL | INTRAMUSCULAR | Status: DC | PRN

## 2012-10-04 MED ORDER — ALISKIREN FUMARATE 150 MG PO TABS
300.0000 mg | ORAL_TABLET | Freq: Every day | ORAL | Status: DC
  Filled 2012-10-04: qty 2

## 2012-10-04 MED ORDER — CLOPIDOGREL BISULFATE 300 MG PO TABS
ORAL_TABLET | ORAL | Status: AC
  Administered 2012-10-05: 08:00:00 75 mg via ORAL
  Filled 2012-10-04: qty 2

## 2012-10-04 MED ORDER — HEPARIN (PORCINE) IN NACL 2-0.9 UNIT/ML-% IJ SOLN
INTRAMUSCULAR | Status: AC
  Filled 2012-10-04: qty 1500

## 2012-10-04 MED ORDER — ASPIRIN 81 MG PO TABS: 81.0000 mg | ORAL_TABLET | Freq: Every day | ORAL | Status: DC

## 2012-10-04 MED ORDER — HYDROCODONE-ACETAMINOPHEN 5-325 MG PO TABS
1.0000 | ORAL_TABLET | ORAL | Status: DC | PRN
Start: 2012-10-04 — End: 2012-10-05

## 2012-10-04 MED ORDER — HYDROCHLOROTHIAZIDE 25 MG PO TABS
25.0000 mg | ORAL_TABLET | Freq: Every day | ORAL | Status: DC
  Filled 2012-10-04 (×2): qty 1

## 2012-10-04 MED ORDER — DIAZEPAM 5 MG PO TABS: 5.0000 mg | ORAL_TABLET | ORAL | Status: AC

## 2012-10-04 MED ORDER — SODIUM CHLORIDE 0.9 % IV SOLN: 250.0000 mL | INTRAVENOUS | Status: DC | PRN

## 2012-10-04 MED ORDER — SODIUM CHLORIDE 0.9 % IV SOLN: 1.0000 mL/kg/h | INTRAVENOUS | Status: AC

## 2012-10-04 MED ORDER — LIDOCAINE HCL (PF) 1 % IJ SOLN
INTRAMUSCULAR | Status: AC
  Filled 2012-10-04: qty 30

## 2012-10-04 MED ORDER — MIDAZOLAM HCL 2 MG/2ML IJ SOLN
INTRAMUSCULAR | Status: AC
  Filled 2012-10-04: qty 2

## 2012-10-04 MED ORDER — CITALOPRAM HYDROBROMIDE 20 MG PO TABS
20.0000 mg | ORAL_TABLET | Freq: Every day | ORAL | Status: DC
  Filled 2012-10-04: qty 1

## 2012-10-04 MED ORDER — VERAPAMIL HCL 2.5 MG/ML IV SOLN
INTRAVENOUS | Status: AC
  Filled 2012-10-04: qty 2

## 2012-10-04 MED ORDER — ASPIRIN EC 81 MG PO TBEC
81.0000 mg | DELAYED_RELEASE_TABLET | Freq: Every day | ORAL | Status: DC
  Filled 2012-10-04: qty 1

## 2012-10-04 MED ORDER — DIAZEPAM 5 MG PO TABS
ORAL_TABLET | ORAL | Status: AC
  Administered 2012-10-04: 5 mg via ORAL
  Filled 2012-10-04: qty 1

## 2012-10-04 MED ORDER — NITROGLYCERIN 0.4 MG SL SUBL: 0.4000 mg | SUBLINGUAL_TABLET | SUBLINGUAL | Status: DC | PRN

## 2012-10-04 MED ORDER — ONDANSETRON HCL 4 MG/2ML IJ SOLN: 4.0000 mg | Freq: Four times a day (QID) | INTRAMUSCULAR | Status: DC | PRN

## 2012-10-04 MED ORDER — CLOPIDOGREL BISULFATE 75 MG PO TABS
75.0000 mg | ORAL_TABLET | Freq: Every day | ORAL | Status: DC
  Filled 2012-10-04: qty 1

## 2012-10-04 MED ORDER — HEPARIN SODIUM (PORCINE) 1000 UNIT/ML IJ SOLN
INTRAMUSCULAR | Status: AC
  Filled 2012-10-04: qty 1

## 2012-10-04 MED ORDER — ASPIRIN 81 MG PO CHEW: 324.0000 mg | CHEWABLE_TABLET | ORAL | Status: DC

## 2012-10-04 MED ORDER — SODIUM CHLORIDE 0.9 % IJ SOLN: 3.0000 mL | Freq: Two times a day (BID) | INTRAMUSCULAR | Status: DC

## 2012-10-04 NOTE — Progress Notes (Addendum)
TR BAND REMOVAL  LOCATION:    right radial  DEFLATED PER PROTOCOL:    yes  TIME BAND OFF / DRESSING APPLIED:    1730  SITE UPON ARRIVAL:    Level 1  SITE AFTER BAND REMOVAL:    Level 1  REVERSE ALLEN'S TEST:     positive  CIRCULATION SENSATION AND MOVEMENT:    Within Normal Limits   yes  COMMENTS:   Gauze dressing dry and intact check 30 minutes after dressing applied at 1800 dressing dry and intact CSM's wnls, radial and ulnar pulses +2

## 2012-10-04 NOTE — H&P (View-Only) (Signed)
History of Present Illness: 52 yo male with history of CAD, HTN, HLD who is here today for cardiac follow up. He has been followed in the past by Dr. Riley Kill. Cath November 2008 per Dr. Riley Kill and LAD was totally occluded in the mid vessel. A 2.5 x 15 mm Mini-Vision bare metal stent was placed in the mid LAD. This was post-dilated with a 3.0 Bee balloon. He was also noted to have 50% mid RCA, 50% mid PDA, 40% OM1, 50% mid Circumflex stenosis. Last cath January 2010 with patent LAD stent, moderate RCA and Circumflex disease. I last saw him in April 2014 and he was doing well.   He is added onto the schedule today because of recent chest pains. He describes squeezing in his chest with normal tasks around the house. There is associated diaphoresis, SOB. This has been occurring for 4-6 weeks. He is using frequent NTG SL. He had one episode of chest pain last week while having sex.    Primary Care Physician: Benita Stabile  Last Lipid Profile:Lipid Panel     Component Value Date/Time   CHOL 136 09/03/2012 0951   TRIG 95.0 09/03/2012 0951   HDL 43.20 09/03/2012 0951   CHOLHDL 3 09/03/2012 0951   VLDL 19.0 09/03/2012 0951   LDLCALC 74 09/03/2012 0951     Past Medical History  Diagnosis Date  . OSA (obstructive sleep apnea)     mild  . Asthma   . Allergic rhinitis   . MI (myocardial infarction)   . Hyperlipidemia   . Hypertension   . Coronary artery disease     statues post percutaneous intervention in November of 08    Past Surgical History  Procedure Laterality Date  . Thumb surgery    . Ex lap for hernia      Current Outpatient Prescriptions  Medication Sig Dispense Refill  . aliskiren (TEKTURNA) 300 MG tablet Take 300 mg by mouth daily.        Marland Kitchen amLODipine (NORVASC) 10 MG tablet Take 1 tablet (10 mg total) by mouth daily.  90 tablet  3  . aspirin 81 MG tablet Take 81 mg by mouth daily.        . citalopram (CELEXA) 20 MG tablet Take 20 mg by mouth daily.        . cyclobenzaprine  (FLEXERIL) 10 MG tablet Take 1 tablet (10 mg total) by mouth 2 (two) times daily as needed for muscle spasms.  20 tablet  0  . hydrochlorothiazide (HYDRODIURIL) 25 MG tablet Take 1 tablet (25 mg total) by mouth daily.  30 tablet  6  . HYDROcodone-acetaminophen (NORCO/VICODIN) 5-325 MG per tablet Take 1 tablet by mouth every 4 (four) hours as needed for pain.  15 tablet  0  . ibuprofen (ADVIL,MOTRIN) 200 MG tablet Take 200 mg by mouth every 6 (six) hours as needed. For pain      . nitroGLYCERIN (NITROSTAT) 0.4 MG SL tablet Place 1 tablet (0.4 mg total) under the tongue every 5 (five) minutes as needed.  25 tablet  11  . pravastatin (PRAVACHOL) 80 MG tablet TAKE 1 TABLET BY MOUTH DAILY  30 tablet  10   No current facility-administered medications for this visit.    No Known Allergies  History   Social History  . Marital Status: Married    Spouse Name: N/A    Number of Children: 1  . Years of Education: N/A   Occupational History  .  [employer] [employer]   Social History Main Topics  . Smoking status: Never Smoker   . Smokeless tobacco: Not on file     Comment: pt states he recently started  . Alcohol Use: 0.5 oz/week    1 drink(s) per week  . Drug Use: No  . Sexually Active: Not on file   Other Topics Concern  . Not on file   Social History Narrative  . No narrative on file    Family History  Problem Relation Age of Onset  . Atrial fibrillation Mother     irregular heart beats  . CAD Maternal Grandfather   . CAD Maternal Grandmother     Review of Systems:  As stated in the HPI and otherwise negative.   BP 138/82  Pulse 48  Ht 5\' 9"  (1.753 m)  Wt 209 lb (94.802 kg)  BMI 30.85 kg/m2  Physical Examination: General: Well developed, well nourished, NAD HEENT: OP clear, mucus membranes moist SKIN: warm, dry. No rashes. Neuro: No focal deficits Musculoskeletal: Muscle strength 5/5 all ext Psychiatric: Mood and affect normal Neck: No JVD, no carotid bruits, no  thyromegaly, no lymphadenopathy. Lungs:Clear bilaterally, no wheezes, rhonci, crackles Cardiovascular: Regular, brady. No murmurs, gallops or rubs. Abdomen:Soft. Bowel sounds present. Non-tender.  Extremities: No lower extremity edema. Pulses are 2 + in the bilateral DP/PT.  EKG: Sinus brady, rate 48 bpm. 1st degree AV block. Old inferior MI. ST elevation precordial leads, unchanged. T wave abnormality in lateral leads, unchanged.   Cardiac cath 05/08/08:  1. The right coronary artery demonstrates diffuse plaquing throughout  the mid and distal vessel. This would be in the range of about 40-  50% but is evident. The right terminates as a posterior  descending, small posterolateral system and then tapers in caliber  distally.  2. The left main is free of critical disease.  3. The LAD at the previous site of stenting is widely patent with less  than 30-40% narrowing at the stent site. This appears to be well-  healed. There was no significant compromise of flow. There is a  diagonal branch that comes off just distal to the stent without  significant narrowing. There is a ramus intermedius with mild  diffuse luminal irregularity.  4. There is a circumflex that has about 40-50% narrowing in its  proximal segment which is segmental. The distal vessel is large in  caliber. The AV circumflex demonstrates about a 50-70% eccentric  plaque, not substantially changed from the previous study supplying  a fairly large posterolateral branch.  Ventriculography was not performed because of mild elevation in  creatinine.  CONCLUSION:  1. Continued patency of the LAD at the prior stent site.  2. Diffuse luminal irregularities of the coronaries with abnormalities  in the first diagonal, the first marginal, the AV circumflex and  diffuse plaquing of the mid right coronary.   Assessment and Plan:   1. Coronary artery disease: Recent chest pain with minimal exertion consistent with unstable angina. His  last cath was in January 2010 and he had moderate disease in the RCA and Circumflex at that time. His LAD stent had mild to moderate restenosis. Will arrange cardiac cath with possible PCI on 10/04/12 in the main cath lab with Dr. Excell Seltzer. Will arrange BMET, CBC and INR today. Risks and benefits reviewed with pt and he agrees to proceed.   2. HYPERLIPIDEMIA: Continue statin. Lipids well controlled. LFTs normal.   3. HYPERTENSION: BP controlled. Continue Tekturna, HCTZ and Norvasc.

## 2012-10-04 NOTE — Progress Notes (Signed)
Utilization Review Completed Koraima Albertsen J. Libra Gatz, RN, BSN, NCM [phone]  

## 2012-10-04 NOTE — Interval H&P Note (Signed)
History and Physical Interval Note:  10/04/2012 10:40 AM  Austin Ingram  has presented today for surgery, with the diagnosis of cad  The various methods of treatment have been discussed with the patient and family. After consideration of risks, benefits and other options for treatment, the patient has consented to  Procedure(s): LEFT HEART CATHETERIZATION WITH CORONARY ANGIOGRAM (N/A) as a surgical intervention .  The patient's history has been reviewed, patient examined, no change in status, stable for surgery.  I have reviewed the patient's chart and labs.  Questions were answered to the patient's satisfaction.    Cath Lab Visit (complete for each Cath Lab visit)  Clinical Evaluation Leading to the Procedure:   ACS: no  Non-ACS:    Anginal Classification: CCS III  Anti-ischemic medical therapy: Minimal Therapy (1 class of medications)  Non-Invasive Test Results: No non-invasive testing performed  Prior CABG: No previous CABG        Tonny Bollman

## 2012-10-04 NOTE — Progress Notes (Signed)
Post PCI note:  Patient is doing well. Radial site is clear. No chest pain or dyspnea. They have a reservation to go to [employer] this week. I told him it's okay to travel tomorrow, as long as he doesn't drive. He will avoid any strenuous activity. He understands the importance of filling his Plavix prescription tomorrow before he leaves. Followup with Dr. Clifton James in a few weeks.  Austin Ingram 10/04/2012 6:38 PM

## 2012-10-04 NOTE — CV Procedure (Signed)
   Cardiac Catheterization Procedure Note  Name: Austin Ingram MRN: [id number] DOB: [birth year]  Procedure: Left Heart Cath, Selective Coronary Angiography, LV angiography, PTCA and stenting of the proximal RCA  Indication:  CCS class III angina, patient with known CAD, symptoms are progressive and he currently has chest discomfort and dyspnea with minimal exertion  Procedural Details:  The right wrist was prepped, draped, and anesthetized with 1% lidocaine. Using the modified Seldinger technique, a 5 French sheath was introduced into the right radial artery. 3 mg of verapamil was administered through the sheath, weight-based unfractionated heparin was administered intravenously. Standard Judkins catheters were used for selective coronary angiography and left ventriculography. Catheter exchanges were performed over an exchange length guidewire.  PROCEDURAL FINDINGS Hemodynamics: AO 132/73 with a mean of 97 LV 132/18   Coronary angiography: Coronary dominance: right  Left mainstem: Widely patent with no obstructive disease  Left anterior descending (LAD): This is a large vessel that wraps around the left ventricular apex. There is no obstructive disease throughout the course of the LAD. The stent is difficult to visualize but I do not appreciate any in-stent restenosis. There are minor irregularities throughout.  Left circumflex (LCx): The left circumflex is patent. The first obtuse marginal was patent. The AV groove circumflex is patent down to the mid vessel with minor irregularity. The mid circumflex extending into an obtuse marginal branch has a 50% stenosis. The second OM branch is large in caliber.  Right coronary artery (RCA): There is diffuse disease present. The proximal RCA has severe 95-99% stenosis. The mid RCA has diffuse 75% stenosis. The distal vessel has diffuse irregularity. The PDA and PLA branches are patent with minor irregularities.  Left ventriculography: Left ventricular  systolic function is moderately depressed. There is inferior wall hypokinesis from the base to the apex. The left ventricular ejection fraction is estimated at 45%.  PCI Note:  Following the diagnostic procedure, the decision was made to proceed with PCI. The radial sheath was upsized to a 6 [place]. Weight-based bivalirudin was given for anticoagulation. The patient was given Plavix 600 mg on the table. Once a therapeutic ACT was achieved, a 6 [place] AL-1 guide catheter was inserted.  A BMW coronary guidewire was used to cross the lesion.  The lesion was predilated with a 2.5 mm balloon.  The lesion was then stented with a 3.0 x 28 mm Promus premier drug-eluting stent.  The stent was postdilated with a 3.25 mm noncompliant balloon to 16 atmospheres.  Following PCI, there was 0% residual stenosis and TIMI-3 flow. Final angiography confirmed an excellent result. The patient tolerated the procedure well. There were no immediate procedural complications. A TR band was used for radial hemostasis. The patient was transferred to the post catheterization recovery area for further monitoring.  PCI Data: Vessel - RCA/Segment - proximal Percent Stenosis (pre)  99 TIMI-flow 3 Stent 3.0 x 28 mm Promus premier drug-eluting Percent Stenosis (post) 0 TIMI-flow (post) 3  Final Conclusions:   1. Severe diffuse proximal and mid RCA stenosis treated successfully with PCI 2. Continued patency of the stented segment in the LAD 3. Moderate segmental LV systolic dysfunction   Recommendations:  Dual antiplatelet therapy with aspirin and Plavix for at least one year. Aggressive risk reduction as the patient has diffuse coronary plaque.  Tonny Bollman 10/04/2012, 11:48 AM

## 2012-10-05 ENCOUNTER — Encounter: Payer: Self-pay | Admitting: Physician Assistant

## 2012-10-05 ENCOUNTER — Encounter (HOSPITAL_COMMUNITY): Payer: Self-pay | Admitting: Physician Assistant

## 2012-10-05 DIAGNOSIS — I208 Other forms of angina pectoris: Secondary | ICD-10-CM | POA: Diagnosis present

## 2012-10-05 DIAGNOSIS — I2510 Atherosclerotic heart disease of native coronary artery without angina pectoris: Secondary | ICD-10-CM

## 2012-10-05 LAB — CBC
HCT: 43.1 % (ref 39.0–52.0)
Hemoglobin: 13.8 g/dL (ref 13.0–17.0)
MCH: 22.9 pg — ABNORMAL LOW (ref 26.0–34.0)
MCHC: 32 g/dL (ref 30.0–36.0)
MCV: 71.6 fL — ABNORMAL LOW (ref 78.0–100.0)
Platelets: 173 10*3/uL (ref 150–400)
RBC: 6.02 MIL/uL — ABNORMAL HIGH (ref 4.22–5.81)
RDW: 14.8 % (ref 11.5–15.5)
WBC: 6.3 10*3/uL (ref 4.0–10.5)

## 2012-10-05 LAB — BASIC METABOLIC PANEL
BUN: 16 mg/dL (ref 6–23)
CO2: 24 mEq/L (ref 19–32)
Calcium: 9.1 mg/dL (ref 8.4–10.5)
Chloride: 103 mEq/L (ref 96–112)
Creatinine, Ser: 1.07 mg/dL (ref 0.50–1.35)
GFR calc Af Amer: >90 mL/min (ref >90)
GFR calc non Af Amer: 80 mL/min — ABNORMAL LOW (ref >90)
Glucose, Bld: 91 mg/dL (ref 70–99)
Potassium: 3.6 mEq/L (ref 3.5–5.1)
Sodium: 139 mEq/L (ref 135–145)

## 2012-10-05 MED ORDER — CLOPIDOGREL BISULFATE 75 MG PO TABS: 75.0000 mg | ORAL_TABLET | Freq: Every day | ORAL | Status: DC

## 2012-10-05 MED ORDER — NITROGLYCERIN 0.4 MG SL SUBL: 0.4000 mg | SUBLINGUAL_TABLET | SUBLINGUAL | Status: DC | PRN

## 2012-10-05 NOTE — Progress Notes (Signed)
[id number] Cardiac Rehab Pt states that he has walked in hall, denies any cp or SOB. Completed stem\nt education with pt and wife. They voice understanding. Pt agrees to Outpt. CRP in [place], will send referral. Melina Copa RN

## 2012-10-05 NOTE — Discharge Summary (Signed)
Discharge Summary   Patient ID: Austin Ingram, MRN: [id number], DOB/AGE: April 14, 1961 52 y.o.  Admit date: 10/04/2012 Discharge date: 10/05/2012   Primary Care Physician:  Benita Stabile   Primary Cardiologist:  Dr. Verne Carrow   Reason for Admission:  Exertional Angina  Primary Discharge Diagnoses:  1. CAD - s/p Promus DES to proximal RCA this admission 2. Ischemic Cardiomyopathy with EF 45% 3. Hypertension  4. Hyperlipidemia   Secondary Discharge Diagnoses:   Past Medical History  Diagnosis Date  . OSA (obstructive sleep apnea)     mild  . Allergic rhinitis   . Hyperlipidemia   . Hypertension   . MI (myocardial infarction) 03/2007  . Asthma     "grew out of it" (10/04/2012)  . Pneumonia     "as a child" (10/04/2012)  . Coronary artery disease     a. s/p prior stent to LAD;  b. LHC 6/14: LAD stent patent, mCFX 50%, pRCA 95-99%, mRCA 75%, EF 45% with inf HK => PCI: Promus DES to pRCA  . Arthritis     "lower back, right thumb, knees" (10/04/2012)  . Depression   . Anxiety      Allergies:   No Known Allergies    Procedures Performed This Admission:    1. Cardiac Cath and PCI 10/04/12: Coronary angiography:  Coronary dominance: right  Left mainstem: Widely patent with no obstructive disease  Left anterior descending (LAD): This is a large vessel that wraps around the left ventricular apex. There is no obstructive disease throughout the course of the LAD. The stent is difficult to visualize but I do not appreciate any in-stent restenosis. There are minor irregularities throughout.  Left circumflex (LCx): The left circumflex is patent. The first obtuse marginal was patent. The AV groove circumflex is patent down to the mid vessel with minor irregularity. The mid circumflex extending into an obtuse marginal branch has a 50% stenosis. The second OM branch is large in caliber.  Right coronary artery (RCA): There is diffuse disease present. The proximal RCA has severe  95-99% stenosis. The mid RCA has diffuse 75% stenosis. The distal vessel has diffuse irregularity. The PDA and PLA branches are patent with minor irregularities.    **s/p Promus premier (3.0x40mm) DES to prox RCA**  Left ventriculography: Left ventricular systolic function is moderately depressed. There is inferior wall hypokinesis from the base to the apex. The left ventricular ejection fraction is estimated at 45%.    Hospital Course:  Austin Ingram is a 52 y.o. male with a history of CAD, status post prior BMS to the mid LAD in 2008, HTN, HL.  He was seen in the office recently with complaints of chest discomfort with minimal exertion. This was felt to be consistent with CCS class III angina. He was set up for cardiac catheterization. Results are as noted above. He had significant stenosis in the RCA which was treated with a Promus DES. Dual antiplatelet therapy recommended for one year. Post PCI course was fairly uneventful. He was evaluated by Dr. Ladona Ridgel this morning and is felt stable for discharge to home.   Discharge Vitals:   Blood pressure 144/99, pulse 59, temperature 98.3 F (36.8 C), temperature source Oral, resp. rate 18, height 5\' 9"  (1.753 m), weight 200 lb 9.9 oz (91 kg), SpO2 98.00%.  Labs:  Recent Labs  10/02/12 1433 10/05/12 0540  WBC 6.4 6.3  HGB 13.3 13.8  HCT 42.9 43.1  MCV 73.9* 71.6*  PLT 189.0 173    Recent  Labs  10/02/12 1433 10/04/12 0900 10/05/12 0540  NA 144  --  139  K 3.2* 3.5 3.6  CL 106  --  103  CO2 29  --  24  BUN 19  --  16  CREATININE 1.1  --  1.07  CALCIUM 9.3  --  9.1    Recent Labs  10/02/12 1433  INR 1.1*    Diagnostic Procedures and Studies:  No results found.   Disposition:   Pt is being discharged home today in good condition.  Follow-up Plans & Appointments    Discharge Medications    Medication List    TAKE these medications       aliskiren 300 MG tablet  Commonly known as:  TEKTURNA  Take 300 mg by mouth  daily.     amLODipine 10 MG tablet  Commonly known as:  NORVASC  Take 1 tablet (10 mg total) by mouth daily.     aspirin 81 MG tablet  Take 81 mg by mouth daily.     citalopram 20 MG tablet  Commonly known as:  CELEXA  Take 20 mg by mouth daily.     clopidogrel 75 MG tablet  Commonly known as:  PLAVIX  Take 1 tablet (75 mg total) by mouth daily with breakfast.     hydrochlorothiazide 25 MG tablet  Commonly known as:  HYDRODIURIL  Take 1 tablet (25 mg total) by mouth daily.     HYDROcodone-acetaminophen 5-325 MG per tablet  Commonly known as:  NORCO/VICODIN  Take 1 tablet by mouth every 4 (four) hours as needed for pain.     nitroGLYCERIN 0.4 MG SL tablet  Commonly known as:  NITROSTAT  Place 0.4 mg under the tongue every 5 (five) minutes as needed for chest pain.     nitroGLYCERIN 0.4 MG SL tablet  Commonly known as:  NITROSTAT  Place 1 tablet (0.4 mg total) under the tongue every 5 (five) minutes as needed for chest pain.     pravastatin 80 MG tablet  Commonly known as:  PRAVACHOL  Take 80 mg by mouth daily.         Outstanding Labs/Studies  1. None   Duration of Discharge Encounter: Greater than 30 minutes including physician and PA time.  Luna Glasgow, [name]  8:52 AM 10/05/2012

## 2012-10-05 NOTE — Progress Notes (Signed)
Progress Note  Name:  Daxtyn Coop  Date:  10/05/2012 8:10 AM    Subjective:  No CP or SOB.   Objective:    Vital signs in last 24 hours: Temp:  [98 F (36.7 C)-98.5 F (36.9 C)] 98.3 F (36.8 C) (06/07 0803) Pulse Rate:  [46-117] 59 (06/07 0803) Resp:  [18-20] 18 (06/07 0803) BP: (131-172)/(75-100) 144/99 mmHg (06/07 0803) SpO2:  [98 %-99 %] 98 % (06/07 0803) Weight:  [200 lb 9.9 oz (91 kg)-209 lb (94.802 kg)] 200 lb 9.9 oz (91 kg) (06/07 0400)  200 lb 9.9 oz (91 kg) Body mass index is 29.61 kg/(m^2).  Weight change:  Last BM Date: 10/04/12  Intake/Output from previous day: 06/06 0701 - 06/07 0700 In: 1194.2 [P.O.:360; I.V.:834.2] Out: 1400 [Urine:1400]   Tele:  Sinus Brady EKG:  Sinus brady, inf, ant-lat Qs, no changes  PHYSICAL EXAM General: no acute distress Neck:  No JVD Lungs: Clear to auscultation bilaterally  Heart: normal S1, S2;  Regular rate and rhythm, no murmur Abdomen: soft, nontender Extremities: no clubbing, cyanosis or edema; right wrist without hematoma or bruit  Skin:  No rash; warm and dry Psych:  Normal affect  Lab Results: Cardiac Markers:  No results found for this basename: TROPONINI, CK, MB,  in the last 72 hours CBC:   Recent Labs  10/02/12 1433 10/05/12 0540  WBC 6.4 6.3  HGB 13.3 13.8  HCT 42.9 43.1  PLT 189.0 173   BMET:  Recent Labs  10/02/12 1433 10/04/12 0900 10/05/12 0540  NA 144  --  139  K 3.2* 3.5 3.6  CL 106  --  103  CO2 29  --  24  GLUCOSE 86  --  91  BUN 19  --  16  CREATININE 1.1  --  1.07  CALCIUM 9.3  --  9.1   Hepatic Function:  No results found for this basename: PROT, ALBUMIN, AST, ALT, ALKPHOS, BILITOT, BILIDIR, IBILI,  in the last 72 hours GFR:  Estimated Creatinine Clearance: 93.1 ml/min (by C-G formula based on Cr of 1.07). Lipids:  No results found for this basename: CHOL, TRIG, HDL, LDL,  in the last 72 hours   Radiology/Studies:  No results found.   Assessment and Plan:  1. CAD:  He is  s/p Promus DES to the pRCA.  We discussed the importance of dual antiplatelet therapy. 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  Ok to drive in 1 week. 2. Ischemic Cardiomyopathy:  He is on aliskiren but no ACE or ARB.  He is not on beta blocker due to bradycardia.  May need to consider changing aliskiren but would continue for now.   3. Hypertension:  Continue Aliskiren, amlodipine, HCTZ. 4. Hyperlipidemia:  Continue zocor. 5. Disposition:  D/c after seen by Dr. Lewayne Bunting.  F/u with me or Dr. Verne Carrow in 2 weeks.   Signed, Tereso Newcomer, [name]  8:10 AM 10/05/2012     Cardiology Attending  Patient seen and examined. Agree with exam, assessment and plan as noted by Mr. Alben Spittle, [place]. Ok to discharge home with followup as above. No heavy lifting.  Leonia Reeves.D.

## 2012-10-23 ENCOUNTER — Encounter: Payer: Self-pay | Admitting: Physician Assistant

## 2012-10-23 ENCOUNTER — Ambulatory Visit (INDEPENDENT_AMBULATORY_CARE_PROVIDER_SITE_OTHER): Payer: Federal, State, Local not specified - PPO | Admitting: Physician Assistant

## 2012-10-23 VITALS — BP 128/82 | HR 50 | Ht 69.0 in | Wt 213.0 lb

## 2012-10-23 DIAGNOSIS — I2589 Other forms of chronic ischemic heart disease: Secondary | ICD-10-CM

## 2012-10-23 DIAGNOSIS — E785 Hyperlipidemia, unspecified: Secondary | ICD-10-CM

## 2012-10-23 DIAGNOSIS — I10 Essential (primary) hypertension: Secondary | ICD-10-CM

## 2012-10-23 DIAGNOSIS — I2510 Atherosclerotic heart disease of native coronary artery without angina pectoris: Secondary | ICD-10-CM

## 2012-10-23 NOTE — Patient Instructions (Addendum)
Your physician recommends that you continue on your current medications as directed. Please refer to the Current Medication list given to you today.  Your physician recommends that you schedule a follow-up appointment in: 3 months with Dr Clifton James

## 2012-10-23 NOTE — Progress Notes (Signed)
[address], [address] [place], [place]  [address] Phone: [phone] Fax:  [phone]  Date:  10/23/2012   ID:  Geralyn Flash Shaul, DOB 1960-07-24, MRN [id number]  PCP:  Benita Stabile, MD  Cardiologist:  Dr. Verne Carrow     History of Present Illness: Austin Ingram is a 52 y.o. male who returns for follow up after recent admission to the hospital 6/6-6/7. He has a history of CAD, status post BMS to the mid LAD in 2008, ICM, HTN, HL.  Echo 6/09:  EF 35-40%, periapical AK, ant HK, mild LAE, mild to mod increased RVSF.  He complained of exertional chest discomfort consistent with CCS class III angina. LHC was arranged 10/04/12: LAD stents patent, mid circumflex 50%, proximal RCA 95-99%, mid RCA 75%, EF 45% with inferior HK. PCI: Promus premier DES to proximal RCA.  Since d/c, he is feeling well.  The patient denies chest pain, shortness of breath, syncope, orthopnea, PND or significant pedal edema.   Labs (5/14):  LDL 74 Labs (6/14):  K 3.6, Cr 1.07, Hgb 13.8   Wt Readings from Last 3 Encounters:  10/05/12 200 lb 9.9 oz (91 kg)  10/05/12 200 lb 9.9 oz (91 kg)  10/02/12 209 lb (94.802 kg)     Past Medical History  Diagnosis Date  . OSA (obstructive sleep apnea)     mild  . Allergic rhinitis   . Hyperlipidemia   . Hypertension   . MI (myocardial infarction) 03/2007  . Asthma     "grew out of it" (10/04/2012)  . Pneumonia     "as a child" (10/04/2012)  . Coronary artery disease     a. s/p prior stent to LAD;  b. LHC 6/14: LAD stent patent, mCFX 50%, pRCA 95-99%, mRCA 75%, EF 45% with inf HK => PCI: Promus DES to pRCA  . Arthritis     "lower back, right thumb, knees" (10/04/2012)  . Depression   . Anxiety     Current Outpatient Prescriptions  Medication Sig Dispense Refill  . aliskiren (TEKTURNA) 300 MG tablet Take 300 mg by mouth daily.       Marland Kitchen amLODipine (NORVASC) 10 MG tablet Take 1 tablet (10 mg total) by mouth daily.  90 tablet  3  . aspirin 81 MG tablet Take  81 mg by mouth daily.       . citalopram (CELEXA) 20 MG tablet Take 20 mg by mouth daily.       . clopidogrel (PLAVIX) 75 MG tablet Take 1 tablet (75 mg total) by mouth daily with breakfast.  30 tablet  11  . hydrochlorothiazide (HYDRODIURIL) 25 MG tablet Take 1 tablet (25 mg total) by mouth daily.  30 tablet  6  . HYDROcodone-acetaminophen (NORCO/VICODIN) 5-325 MG per tablet Take 1 tablet by mouth every 4 (four) hours as needed for pain.  15 tablet  0  . nitroGLYCERIN (NITROSTAT) 0.4 MG SL tablet Place 0.4 mg under the tongue every 5 (five) minutes as needed for chest pain.      . nitroGLYCERIN (NITROSTAT) 0.4 MG SL tablet Place 1 tablet (0.4 mg total) under the tongue every 5 (five) minutes as needed for chest pain.  25 tablet  11  . pravastatin (PRAVACHOL) 80 MG tablet Take 80 mg by mouth daily.       No current facility-administered medications for this visit.    Allergies:   No Known Allergies  Social History:  The patient  reports that he quit smoking about  13 years ago. His smoking use included Cigarettes. He has a 10 pack-year smoking history. He has never used smokeless tobacco. He reports that  drinks alcohol. He reports that he does not use illicit drugs.   ROS:  Please see the history of present illness.      All other systems reviewed and negative.   PHYSICAL EXAM: VS:  BP 128/82  Pulse 50  Ht 5\' 9"  (1.753 m)  Wt 213 lb (96.616 kg)  BMI 31.44 kg/m2 Well nourished, well developed, in no acute distress HEENT: normal Neck: no JVD Cardiac:  normal S1, S2; RRR; no murmur Lungs:  clear to auscultation bilaterally, no wheezing, rhonchi or rales Abd: soft, nontender, no hepatomegaly Ext: no edema; right wrist without hematoma or bruit  Skin: warm and dry Neuro:  CNs 2-12 intact, no focal abnormalities noted  EKG:  Sinus brady, HR 50, normal axis, lat TWI, no change from prior tracings     ASSESSMENT AND PLAN:  1. CAD:  Doing well after recent PCI to the RCA with a DES.  We  discussed the importance of dual antiplatelet therapy.  Continue statin. 2. Ischemic Cardiomyopathy:  Continue direct renin inhibitor.  HR too low to tolerate beta blocker.  EF > 35%.  No evidence of volume overload.  3. Hypertension:  Controlled.  Continue current therapy.  4. Hyperlipidemia:  Recent LDL optimal.  Continue statin. 5. Disposition:  F/u with Dr. Verne Carrow in 3 mos.   Signed, Tereso Newcomer, [name]  10/23/2012 9:01 AM

## 2012-11-06 ENCOUNTER — Telehealth: Payer: Self-pay | Admitting: Cardiovascular Disease

## 2012-11-06 NOTE — Telephone Encounter (Signed)
Per just saw Tereso Newcomer and was told to f/u in 3 months.  Pt is not having any problems.  appt for 7/10 has been cancelled.  Pt has a follow up appt 9/24 and wife is aware.

## 2012-11-06 NOTE — Telephone Encounter (Signed)
New Prob      Calling to see if pts appt tomorrow with Dr. Clifton James is necessary. Pt saw Tereso Newcomer a few weeks ago. Please call.

## 2012-11-07 ENCOUNTER — Ambulatory Visit: Payer: Federal, State, Local not specified - PPO | Admitting: Cardiovascular Disease

## 2012-11-12 ENCOUNTER — Other Ambulatory Visit: Payer: Self-pay | Admitting: Cardiology

## 2012-11-16 ENCOUNTER — Other Ambulatory Visit: Payer: Self-pay | Admitting: Cardiology

## 2012-11-28 ENCOUNTER — Inpatient Hospital Stay (HOSPITAL_COMMUNITY): Admission: RE | Admit: 2012-11-28 | Payer: Federal, State, Local not specified - PPO | Source: Ambulatory Visit

## 2012-12-02 ENCOUNTER — Ambulatory Visit (HOSPITAL_COMMUNITY): Payer: Federal, State, Local not specified - PPO

## 2012-12-04 ENCOUNTER — Ambulatory Visit (HOSPITAL_COMMUNITY): Payer: Federal, State, Local not specified - PPO

## 2012-12-05 ENCOUNTER — Ambulatory Visit: Payer: Federal, State, Local not specified - PPO | Admitting: Cardiovascular Disease

## 2012-12-06 ENCOUNTER — Ambulatory Visit (HOSPITAL_COMMUNITY): Payer: Federal, State, Local not specified - PPO

## 2012-12-09 ENCOUNTER — Ambulatory Visit (HOSPITAL_COMMUNITY): Payer: Federal, State, Local not specified - PPO

## 2012-12-11 ENCOUNTER — Ambulatory Visit (HOSPITAL_COMMUNITY): Payer: Federal, State, Local not specified - PPO

## 2012-12-13 ENCOUNTER — Ambulatory Visit (HOSPITAL_COMMUNITY): Payer: Federal, State, Local not specified - PPO

## 2012-12-16 ENCOUNTER — Ambulatory Visit (HOSPITAL_COMMUNITY): Payer: Federal, State, Local not specified - PPO

## 2012-12-18 ENCOUNTER — Ambulatory Visit (HOSPITAL_COMMUNITY): Payer: Federal, State, Local not specified - PPO

## 2012-12-20 ENCOUNTER — Ambulatory Visit (HOSPITAL_COMMUNITY): Payer: Federal, State, Local not specified - PPO

## 2012-12-23 ENCOUNTER — Ambulatory Visit (HOSPITAL_COMMUNITY): Payer: Federal, State, Local not specified - PPO

## 2012-12-25 ENCOUNTER — Ambulatory Visit (HOSPITAL_COMMUNITY): Payer: Federal, State, Local not specified - PPO

## 2012-12-27 ENCOUNTER — Ambulatory Visit (HOSPITAL_COMMUNITY): Payer: Federal, State, Local not specified - PPO

## 2012-12-30 ENCOUNTER — Ambulatory Visit (HOSPITAL_COMMUNITY): Payer: Federal, State, Local not specified - PPO

## 2013-01-01 ENCOUNTER — Ambulatory Visit (HOSPITAL_COMMUNITY): Payer: Federal, State, Local not specified - PPO

## 2013-01-03 ENCOUNTER — Ambulatory Visit (HOSPITAL_COMMUNITY): Payer: Federal, State, Local not specified - PPO

## 2013-01-06 ENCOUNTER — Ambulatory Visit (HOSPITAL_COMMUNITY): Payer: Federal, State, Local not specified - PPO

## 2013-01-08 ENCOUNTER — Ambulatory Visit (HOSPITAL_COMMUNITY): Payer: Federal, State, Local not specified - PPO

## 2013-01-10 ENCOUNTER — Ambulatory Visit (HOSPITAL_COMMUNITY): Payer: Federal, State, Local not specified - PPO

## 2013-01-13 ENCOUNTER — Ambulatory Visit (HOSPITAL_COMMUNITY): Payer: Federal, State, Local not specified - PPO

## 2013-01-15 ENCOUNTER — Ambulatory Visit (HOSPITAL_COMMUNITY): Payer: Federal, State, Local not specified - PPO

## 2013-01-17 ENCOUNTER — Ambulatory Visit (HOSPITAL_COMMUNITY): Payer: Federal, State, Local not specified - PPO

## 2013-01-20 ENCOUNTER — Ambulatory Visit (HOSPITAL_COMMUNITY): Payer: Federal, State, Local not specified - PPO

## 2013-01-22 ENCOUNTER — Ambulatory Visit (HOSPITAL_COMMUNITY): Payer: Federal, State, Local not specified - PPO

## 2013-01-22 ENCOUNTER — Ambulatory Visit (INDEPENDENT_AMBULATORY_CARE_PROVIDER_SITE_OTHER): Payer: Federal, State, Local not specified - PPO | Admitting: Cardiovascular Disease

## 2013-01-22 ENCOUNTER — Encounter: Payer: Self-pay | Admitting: Cardiovascular Disease

## 2013-01-22 VITALS — BP 146/82 | HR 54 | Ht 69.0 in | Wt 210.0 lb

## 2013-01-22 DIAGNOSIS — I10 Essential (primary) hypertension: Secondary | ICD-10-CM

## 2013-01-22 DIAGNOSIS — I2589 Other forms of chronic ischemic heart disease: Secondary | ICD-10-CM

## 2013-01-22 DIAGNOSIS — E785 Hyperlipidemia, unspecified: Secondary | ICD-10-CM

## 2013-01-22 DIAGNOSIS — I2510 Atherosclerotic heart disease of native coronary artery without angina pectoris: Secondary | ICD-10-CM

## 2013-01-22 DIAGNOSIS — I255 Ischemic cardiomyopathy: Secondary | ICD-10-CM

## 2013-01-22 NOTE — Patient Instructions (Addendum)
Your physician wants you to follow-up in:  6 months. You will receive a reminder letter in the mail two months in advance. If you don't receive a letter, please call our office to schedule the follow-up appointment.   

## 2013-01-22 NOTE — Progress Notes (Signed)
History of Present Illness: 52 yo male with history of CAD, ischemic cardiomyopathy, HTN, HLD who returns for follow up. He was admitted to [facility] October 04, 2012 for left heart cath which showed LAD stents patent, mid circumflex 50%, proximal RCA 95-99%, mid RCA 75%, EF 45% with inferior HK. PCI: Promus premier DES to proximal RCA. He had a BMS placed in the LAD in 2008. Echo 2009: EF 35-40%, periapical AK, ant HK, mild LAE. He did not tolerate Ace-inhibitors due to cough. He is not on a beta blocker due to bradycardia. He did not tolerate ARB secondary to dizziness.   He is here today for follow up. He has been feeling well without chest pain or SOB. No palpitations. He does feel very fatigued during the day and after work.   Primary Care Physician: Lupe Carney  Last Lipid Profile:Lipid Panel     Component Value Date/Time   CHOL 136 09/03/2012 0951   TRIG 95.0 09/03/2012 0951   HDL 43.20 09/03/2012 0951   CHOLHDL 3 09/03/2012 0951   VLDL 19.0 09/03/2012 0951   LDLCALC 74 09/03/2012 0951     Past Medical History  Diagnosis Date  . OSA (obstructive sleep apnea)     mild  . Allergic rhinitis   . Hyperlipidemia   . Hypertension   . MI (myocardial infarction) 03/2007  . Asthma     "grew out of it" (10/04/2012)  . Pneumonia     "as a child" (10/04/2012)  . Coronary artery disease     a. s/p prior stent to LAD;  b. LHC 6/14: LAD stent patent, mCFX 50%, pRCA 95-99%, mRCA 75%, EF 45% with inf HK => PCI: Promus DES to pRCA  . Arthritis     "lower back, right thumb, knees" (10/04/2012)  . Depression   . Anxiety     Past Surgical History  Procedure Laterality Date  . Cardiac catheterization  2010  . Coronary angioplasty with stent placement  2008; 10/04/2012    "1 + 1" (10/04/2012)  . Exploratory laparotomy  1990's?    "went in to repair hernia; found fatty tissue instead; no hernia repair" (10/04/2012)  . Arthroplasty Right 1993    'crushed; removed bone fragments" (10/04/2012)    Current Outpatient  Prescriptions  Medication Sig Dispense Refill  . aliskiren (TEKTURNA) 300 MG tablet Take 300 mg by mouth daily.       Marland Kitchen amLODipine (NORVASC) 10 MG tablet Take 1 tablet (10 mg total) by mouth daily.  90 tablet  3  . aspirin 81 MG tablet Take 81 mg by mouth daily.       . citalopram (CELEXA) 20 MG tablet Take 20 mg by mouth daily.       . clopidogrel (PLAVIX) 75 MG tablet Take 1 tablet (75 mg total) by mouth daily with breakfast.  30 tablet  11  . hydrochlorothiazide (HYDRODIURIL) 25 MG tablet Take 1 tablet (25 mg total) by mouth daily.  30 tablet  6  . HYDROcodone-acetaminophen (NORCO/VICODIN) 5-325 MG per tablet Take 1 tablet by mouth every 4 (four) hours as needed for pain.  15 tablet  0  . nitroGLYCERIN (NITROSTAT) 0.4 MG SL tablet Place 1 tablet (0.4 mg total) under the tongue every 5 (five) minutes as needed for chest pain.  25 tablet  11  . pravastatin (PRAVACHOL) 80 MG tablet Take 80 mg by mouth daily.       No current facility-administered medications for this visit.    No  Known Allergies  History   Social History  . Marital Status: Married    Spouse Name: N/A    Number of Children: 1  . Years of Education: N/A   Occupational History  .  [employer]   Social History Main Topics  . Smoking status: Former Smoker -- 0.50 packs/day for 20 years    Types: Cigarettes    Quit date: 12/31/1998  . Smokeless tobacco: Never Used  . Alcohol Use: 0.0 oz/week     Comment: 10/04/2012 "1 beer plus 2 mixed drinks once/month"  . Drug Use: No  . Sexual Activity: Yes   Other Topics Concern  . Not on file   Social History Narrative  . No narrative on file    Family History  Problem Relation Age of Onset  . Atrial fibrillation Mother     irregular heart beats  . CAD Maternal Grandfather   . CAD Maternal Grandmother     Review of Systems:  As stated in the HPI and otherwise negative.   BP 146/82  Pulse 54  Ht 5\' 9"  (1.753 m)  Wt 210 lb (95.255 kg)  BMI 31 kg/m2  Physical  Examination: General: Well developed, well nourished, NAD HEENT: OP clear, mucus membranes moist SKIN: warm, dry. No rashes. Neuro: No focal deficits Musculoskeletal: Muscle strength 5/5 all ext Psychiatric: Mood and affect normal Neck: No JVD, no carotid bruits, no thyromegaly, no lymphadenopathy. Lungs:Clear bilaterally, no wheezes, rhonci, crackles Cardiovascular: Regular rate and rhythm. No murmurs, gallops or rubs. Abdomen:Soft. Bowel sounds present. Non-tender.  Extremities: No lower extremity edema. Pulses are 2 + in the bilateral DP/PT.  Cardiac cath 10/04/12:  Left mainstem: Widely patent with no obstructive disease  Left anterior descending (LAD): This is a large vessel that wraps around the left ventricular apex. There is no obstructive disease throughout the course of the LAD. The stent is difficult to visualize but I do not appreciate any in-stent restenosis. There are minor irregularities throughout.  Left circumflex (LCx): The left circumflex is patent. The first obtuse marginal was patent. The AV groove circumflex is patent down to the mid vessel with minor irregularity. The mid circumflex extending into an obtuse marginal branch has a 50% stenosis. The second OM branch is large in caliber.  Right coronary artery (RCA): There is diffuse disease present. The proximal RCA has severe 95-99% stenosis. The mid RCA has diffuse 75% stenosis. The distal vessel has diffuse irregularity. The PDA and PLA branches are patent with minor irregularities.  Left ventriculography: Left ventricular systolic function is moderately depressed. There is inferior wall hypokinesis from the base to the apex. The left ventricular ejection fraction is estimated at 45%.  PCI Note: Following the diagnostic procedure, the decision was made to proceed with PCI. The radial sheath was upsized to a 6 [place]. Weight-based bivalirudin was given for anticoagulation. The patient was given Plavix 600 mg on the table. Once  a therapeutic ACT was achieved, a 6 [place] AL-1 guide catheter was inserted. A BMW coronary guidewire was used to cross the lesion. The lesion was predilated with a 2.5 mm balloon. The lesion was then stented with a 3.0 x 28 mm Promus premier drug-eluting stent. The stent was postdilated with a 3.25 mm noncompliant balloon to 16 atmospheres. Following PCI, there was 0% residual stenosis and TIMI-3 flow. Final angiography confirmed an excellent result. The patient tolerated the procedure well. There were no immediate procedural complications. A TR band was used for radial hemostasis. The  patient was transferred to the post catheterization recovery area for further monitoring.  PCI Data:  Vessel - RCA/Segment - proximal  Percent Stenosis (pre) 99  TIMI-flow 3  Stent 3.0 x 28 mm Promus premier drug-eluting  Percent Stenosis (post) 0  TIMI-flow (post) 3  Final Conclusions:  1. Severe diffuse proximal and mid RCA stenosis treated successfully with PCI  2. Continued patency of the stented segment in the LAD  3. Moderate segmental LV systolic dysfunction    Assessment and Plan:   1. CAD: Stable post PCI. Continue dual anti-platelet therapy for one year. Continue statin.   2. HTN: BP controlled at home. He has not taken meds today. No changes.   3. HLD: Lipids well controlled. Continue statin.   4. Ischemic cardiomyopathy: LVEF=45% by cath. He does tolerate Ace-inh, ARB or beta blockers.

## 2013-01-24 ENCOUNTER — Ambulatory Visit (HOSPITAL_COMMUNITY): Payer: Federal, State, Local not specified - PPO

## 2013-01-27 ENCOUNTER — Ambulatory Visit (HOSPITAL_COMMUNITY): Payer: Federal, State, Local not specified - PPO

## 2013-01-29 ENCOUNTER — Ambulatory Visit (HOSPITAL_COMMUNITY): Payer: Federal, State, Local not specified - PPO

## 2013-01-31 ENCOUNTER — Ambulatory Visit (HOSPITAL_COMMUNITY): Payer: Federal, State, Local not specified - PPO

## 2013-02-03 ENCOUNTER — Ambulatory Visit (HOSPITAL_COMMUNITY): Payer: Federal, State, Local not specified - PPO

## 2013-02-05 ENCOUNTER — Ambulatory Visit (HOSPITAL_COMMUNITY): Payer: Federal, State, Local not specified - PPO

## 2013-02-07 ENCOUNTER — Ambulatory Visit (HOSPITAL_COMMUNITY): Payer: Federal, State, Local not specified - PPO

## 2013-02-10 ENCOUNTER — Ambulatory Visit (HOSPITAL_COMMUNITY): Payer: Federal, State, Local not specified - PPO

## 2013-02-12 ENCOUNTER — Ambulatory Visit (HOSPITAL_COMMUNITY): Payer: Federal, State, Local not specified - PPO

## 2013-02-14 ENCOUNTER — Ambulatory Visit (HOSPITAL_COMMUNITY): Payer: Federal, State, Local not specified - PPO

## 2013-02-17 ENCOUNTER — Ambulatory Visit (HOSPITAL_COMMUNITY): Payer: Federal, State, Local not specified - PPO

## 2013-02-19 ENCOUNTER — Ambulatory Visit (HOSPITAL_COMMUNITY): Payer: Federal, State, Local not specified - PPO

## 2013-02-21 ENCOUNTER — Ambulatory Visit (HOSPITAL_COMMUNITY): Payer: Federal, State, Local not specified - PPO

## 2013-02-24 ENCOUNTER — Ambulatory Visit (HOSPITAL_COMMUNITY): Payer: Federal, State, Local not specified - PPO

## 2013-02-26 ENCOUNTER — Ambulatory Visit (HOSPITAL_COMMUNITY): Payer: Federal, State, Local not specified - PPO

## 2013-02-28 ENCOUNTER — Ambulatory Visit (HOSPITAL_COMMUNITY): Payer: Federal, State, Local not specified - PPO

## 2013-03-03 ENCOUNTER — Ambulatory Visit (HOSPITAL_COMMUNITY): Payer: Federal, State, Local not specified - PPO

## 2013-03-05 ENCOUNTER — Ambulatory Visit (HOSPITAL_COMMUNITY): Payer: Federal, State, Local not specified - PPO

## 2013-03-07 ENCOUNTER — Ambulatory Visit (HOSPITAL_COMMUNITY): Payer: Federal, State, Local not specified - PPO

## 2013-04-03 ENCOUNTER — Encounter: Payer: Self-pay | Admitting: Cardiovascular Disease

## 2013-04-07 ENCOUNTER — Other Ambulatory Visit: Payer: Self-pay | Admitting: Cardiovascular Disease

## 2013-04-18 ENCOUNTER — Other Ambulatory Visit: Payer: Self-pay | Admitting: Cardiovascular Disease

## 2013-05-27 ENCOUNTER — Other Ambulatory Visit: Payer: Self-pay | Admitting: Family Medicine

## 2013-05-27 ENCOUNTER — Ambulatory Visit
Admission: RE | Admit: 2013-05-27 | Discharge: 2013-05-27 | Disposition: A | Discharge status: Home / Self Care | Payer: Federal, State, Local not specified - PPO | Source: Ambulatory Visit | Attending: Family Medicine | Admitting: Family Medicine

## 2013-05-27 DIAGNOSIS — J45909 Unspecified asthma, uncomplicated: Secondary | ICD-10-CM

## 2013-06-03 ENCOUNTER — Telehealth: Payer: Self-pay | Admitting: Cardiovascular Disease

## 2013-06-03 NOTE — Telephone Encounter (Signed)
Made Dr app for this week to discuss.

## 2013-06-03 NOTE — Telephone Encounter (Signed)
New Prob    Calling regarding disability forms and how to move forward regarding protocol for completion. Please call.

## 2013-06-05 ENCOUNTER — Encounter: Payer: Self-pay | Admitting: Cardiovascular Disease

## 2013-06-05 ENCOUNTER — Ambulatory Visit (INDEPENDENT_AMBULATORY_CARE_PROVIDER_SITE_OTHER): Payer: Federal, State, Local not specified - PPO | Admitting: Cardiovascular Disease

## 2013-06-05 VITALS — BP 139/83 | HR 64 | Ht 69.0 in | Wt 215.0 lb

## 2013-06-05 DIAGNOSIS — E785 Hyperlipidemia, unspecified: Secondary | ICD-10-CM

## 2013-06-05 DIAGNOSIS — I10 Essential (primary) hypertension: Secondary | ICD-10-CM

## 2013-06-05 DIAGNOSIS — I2589 Other forms of chronic ischemic heart disease: Secondary | ICD-10-CM

## 2013-06-05 DIAGNOSIS — I255 Ischemic cardiomyopathy: Secondary | ICD-10-CM

## 2013-06-05 DIAGNOSIS — I2510 Atherosclerotic heart disease of native coronary artery without angina pectoris: Secondary | ICD-10-CM

## 2013-06-05 DIAGNOSIS — R079 Chest pain, unspecified: Secondary | ICD-10-CM

## 2013-06-05 NOTE — Patient Instructions (Signed)
Your physician recommends that you schedule a follow-up appointment in: about 6 weeks.   Your physician has requested that you have an exercise stress myoview. For further information please visit [email or web address] Please follow instruction sheet, as given.

## 2013-06-05 NOTE — Progress Notes (Signed)
History of Present Illness: 53 yo male with history of CAD, ischemic cardiomyopathy, HTN, HLD who returns for follow up. He was [facility] October [facility], 2012 for left heart cath which showed LAD stents patent, mid circumflex 50%, proximal RCA 95-99%, mid RCA 75%, EF 45% with inferior HK. PCI: Promus premier DES to proximal RCA. He had a BMS placed in the LAD in 2008. Last echo 2009: EF 35-40%, periapical AK, ant HK, mild LAE. He did not tolerate Ace-inhibitors due to cough. He is not on a beta blocker due to bradycardia. He did not tolerate ARB secondary to dizziness. He called in this week to discuss disability forms.   He is here today for follow up. He inhaled something and work and has had worsened SOB. He has had some sharp left sided chest pains with exertion. These resolve quickly with rest. He has some associated SOB. No palpitations. He does feel very fatigued during the day and after work.   Primary Care Physician: Lupe CarneyDean Mitchell  Last Lipid Profile:Lipid Panel     Component Value Date/Time   CHOL 136 09/03/2012 0951   TRIG 95.0 09/03/2012 0951   HDL 43.20 09/03/2012 0951   CHOLHDL 3 09/03/2012 0951   VLDL 19.0 09/03/2012 0951   LDLCALC 74 09/03/2012 0951   Past Medical History  Diagnosis Date  . OSA (obstructive sleep apnea)     mild  . Allergic rhinitis   . Hyperlipidemia   . Hypertension   . MI (myocardial infarction) 03/2007  . Asthma     "grew out of it" (10/04/2012)  . Pneumonia     "as a child" (10/04/2012)  . Coronary artery disease     a. s/p prior stent to LAD;  b. LHC 6/14: LAD stent patent, mCFX 50%, pRCA 95-99%, mRCA 75%, EF 45% with inf HK => PCI: Promus DES to pRCA  . Arthritis     "lower back, right thumb, knees" (10/04/2012)  . Depression   . Anxiety     Past Surgical History  Procedure Laterality Date  . Cardiac catheterization  2010  . Coronary angioplasty with stent placement  2008; 10/04/2012    "1 + 1" (10/04/2012)  . Exploratory laparotomy  1990's?    "went  in to repair hernia; found fatty tissue instead; no hernia repair" (10/04/2012)  . Arthroplasty Right 1993    'crushed; removed bone fragments" (10/04/2012)    Current Outpatient Prescriptions  Medication Sig Dispense Refill  . aliskiren (TEKTURNA) 300 MG tablet Take 300 mg by mouth daily.       Marland Kitchen. amLODipine (NORVASC) 10 MG tablet Take 1 tablet (10 mg total) by mouth daily.  90 tablet  3  . aspirin 81 MG tablet Take 81 mg by mouth daily.       . citalopram (CELEXA) 20 MG tablet Take 20 mg by mouth daily.       . clopidogrel (PLAVIX) 75 MG tablet Take 1 tablet (75 mg total) by mouth daily with breakfast.  30 tablet  11  . hydrochlorothiazide (HYDRODIURIL) 25 MG tablet TAKE 1 TABLET (25 MG TOTAL) BY MOUTH DAILY.  30 tablet  3  . HYDROcodone-acetaminophen (NORCO/VICODIN) 5-325 MG per tablet Take 1 tablet by mouth every 4 (four) hours as needed for pain.  15 tablet  0  . nitroGLYCERIN (NITROSTAT) 0.4 MG SL tablet Place 1 tablet (0.4 mg total) under the tongue every 5 (five) minutes as needed for chest pain.  25 tablet  11  .  pravastatin (PRAVACHOL) 80 MG tablet Take 80 mg by mouth daily.       No current facility-administered medications for this visit.    No Known Allergies  History   Social History  . Marital Status: Married    Spouse Name: N/A    Number of Children: 1  . Years of Education: N/A   Occupational History  .  [employer]   Social History Main Topics  . Smoking status: Former Smoker -- 0.50 packs/day for 20 years    Types: Cigarettes    Quit date: 12/31/1998  . Smokeless tobacco: Never Used  . Alcohol Use: 0.0 oz/week     Comment: 10/04/2012 "1 beer plus 2 mixed drinks once/month"  . Drug Use: No  . Sexual Activity: Yes   Other Topics Concern  . Not on file   Social History Narrative  . No narrative on file    Family History  Problem Relation Age of Onset  . Atrial fibrillation Mother     irregular heart beats  . CAD Maternal Grandfather   . CAD Maternal  Grandmother     Review of Systems:  As stated in the HPI and otherwise negative.   BP 139/83  Pulse 64  Ht 5\' 9"  (1.753 m)  Wt 215 lb (97.523 kg)  BMI 31.74 kg/m2  Physical Examination: General: Well developed, well nourished, NAD HEENT: OP clear, mucus membranes moist SKIN: warm, dry. No rashes. Neuro: No focal deficits Musculoskeletal: Muscle strength 5/5 all ext Psychiatric: Mood and affect normal Neck: No JVD, no carotid bruits, no thyromegaly, no lymphadenopathy. Lungs:Clear bilaterally, no wheezes, rhonci, crackles Cardiovascular: Regular rate and rhythm. No murmurs, gallops or rubs. Abdomen:Soft. Bowel sounds present. Non-tender.  Extremities: No lower extremity edema. Pulses are 2 + in the bilateral DP/PT.  Cardiac cath 10/04/12:  Left mainstem: Widely patent with no obstructive disease  Left anterior descending (LAD): This is a large vessel that wraps around the left ventricular apex. There is no obstructive disease throughout the course of the LAD. The stent is difficult to visualize but I do not appreciate any in-stent restenosis. There are minor irregularities throughout.  Left circumflex (LCx): The left circumflex is patent. The first obtuse marginal was patent. The AV groove circumflex is patent down to the mid vessel with minor irregularity. The mid circumflex extending into an obtuse marginal branch has a 50% stenosis. The second OM branch is large in caliber.  Right coronary artery (RCA): There is diffuse disease present. The proximal RCA has severe 95-99% stenosis. The mid RCA has diffuse 75% stenosis. The distal vessel has diffuse irregularity. The PDA and PLA branches are patent with minor irregularities.  Left ventriculography: Left ventricular systolic function is moderately depressed. There is inferior wall hypokinesis from the base to the apex. The left ventricular ejection fraction is estimated at 45%.  PCI Note: Following the diagnostic procedure, the decision  was made to proceed with PCI. The radial sheath was upsized to a 6 [place]. Weight-based bivalirudin was given for anticoagulation. The patient was given Plavix 600 mg on the table. Once a therapeutic ACT was achieved, a 6 [place] AL-1 guide catheter was inserted. A BMW coronary guidewire was used to cross the lesion. The lesion was predilated with a 2.5 mm balloon. The lesion was then stented with a 3.0 x 28 mm Promus premier drug-eluting stent. The stent was postdilated with a 3.25 mm noncompliant balloon to 16 atmospheres. Following PCI, there was 0% residual stenosis and TIMI-3  flow. Final angiography confirmed an excellent result. The patient tolerated the procedure well. There were no immediate procedural complications. A TR band was used for radial hemostasis. The patient was transferred to the post catheterization recovery area for further monitoring.  PCI Data:  Vessel - RCA/Segment - proximal  Percent Stenosis (pre) 99  TIMI-flow 3  Stent 3.0 x 28 mm Promus premier drug-eluting  Percent Stenosis (post) 0  TIMI-flow (post) 3  Final Conclusions:  1. Severe diffuse proximal and mid RCA stenosis treated successfully with PCI  2. Continued patency of the stented segment in the LAD  3. Moderate segmental LV systolic dysfunction   Assessment and Plan:   1. CAD: Doing well but over last few weeks has noticed increase in left sided chest pain with exertion and associated SOB. Recent PCI with DES June 2014. Will arrange exercise stress myoview to exclude ischemia. Continue dual anti-platelet therapy long term given recent findings with DAPT trial. Continue statin. He has discussed possible disability today given symptoms. Will complete ischemic evaluation before I comment on his long term ability to continue working.   2. HTN: BP controlled. No changes today.   3. HLD: Lipids well controlled. Continue statin.   4. Ischemic cardiomyopathy: LVEF=45% by cath June 2014. Known to have moderate LV  dysfunction since 2008. He does tolerate Ace-inh, ARB or beta blockers. Will be able to reassess LVEF with myoview.   5. Chest pain: As above.

## 2013-06-05 NOTE — Telephone Encounter (Signed)
Pt saw Dr. McAlhany today 

## 2013-06-20 ENCOUNTER — Encounter: Payer: Self-pay | Admitting: Cardiology

## 2013-06-23 ENCOUNTER — Encounter: Payer: Self-pay | Admitting: Cardiology

## 2013-06-23 ENCOUNTER — Ambulatory Visit (HOSPITAL_COMMUNITY): Payer: Federal, State, Local not specified - PPO | Attending: Cardiology | Admitting: Radiology

## 2013-06-23 VITALS — BP 165/116 | Ht 69.0 in | Wt 205.0 lb

## 2013-06-23 DIAGNOSIS — I10 Essential (primary) hypertension: Secondary | ICD-10-CM | POA: Insufficient documentation

## 2013-06-23 DIAGNOSIS — I4891 Unspecified atrial fibrillation: Secondary | ICD-10-CM | POA: Insufficient documentation

## 2013-06-23 DIAGNOSIS — R0602 Shortness of breath: Secondary | ICD-10-CM

## 2013-06-23 DIAGNOSIS — I2510 Atherosclerotic heart disease of native coronary artery without angina pectoris: Secondary | ICD-10-CM

## 2013-06-23 DIAGNOSIS — Z87891 Personal history of nicotine dependence: Secondary | ICD-10-CM | POA: Insufficient documentation

## 2013-06-23 DIAGNOSIS — I428 Other cardiomyopathies: Secondary | ICD-10-CM | POA: Insufficient documentation

## 2013-06-23 DIAGNOSIS — E785 Hyperlipidemia, unspecified: Secondary | ICD-10-CM | POA: Insufficient documentation

## 2013-06-23 DIAGNOSIS — Z8249 Family history of ischemic heart disease and other diseases of the circulatory system: Secondary | ICD-10-CM | POA: Insufficient documentation

## 2013-06-23 DIAGNOSIS — I252 Old myocardial infarction: Secondary | ICD-10-CM | POA: Insufficient documentation

## 2013-06-23 DIAGNOSIS — R079 Chest pain, unspecified: Secondary | ICD-10-CM

## 2013-06-23 DIAGNOSIS — I255 Ischemic cardiomyopathy: Secondary | ICD-10-CM

## 2013-06-23 MED ORDER — TECHNETIUM TC 99M SESTAMIBI GENERIC - CARDIOLITE
10.0000 | Freq: Once | INTRAVENOUS | Status: AC | PRN
  Administered 2013-06-23: 10 via INTRAVENOUS

## 2013-06-23 MED ORDER — TECHNETIUM TC 99M SESTAMIBI GENERIC - CARDIOLITE
30.0000 | Freq: Once | INTRAVENOUS | Status: AC | PRN
  Administered 2013-06-23: 30 via INTRAVENOUS

## 2013-06-23 NOTE — Progress Notes (Signed)
[facility] SITE 3 NUCLEAR MED [address] [place], [place] [address] [phone]    Cardiology Nuclear Med Austin Ingram is a 53 y.o. male     MRN : [id number]     DOB: 1960/12/22  Procedure Date: 06/23/2013  Nuclear Med Background Indication for Stress Test:  Evaluation for Ischemia and Stent Patency History:  MI-CAD-CATH-STENT-AFiB, ECHO: 2009 Cardiomyopathy-EF: 35-40% Cardiac Risk Factors: Strong, Premature Family History - CAD, History of Smoking, Hypertension and Lipids  Symptoms:  Chest Pain and SOB   Nuclear Pre-Procedure Caffeine/Decaff Intake:  None > 12 hrs NPO After: 6:30pm   Lungs:  clear O2 Sat: 97% on room air. IV 0.9% NS with Angio Cath:  22g  IV Site: R Antecubital x 1, tolerated well IV Started by:  Irean Hong, RN  Chest Size (in):  46 Cup Size: n/a  Height: 5\' 9"  (1.753 m)  Weight:  205 lb (92.987 kg)  BMI:  Body mass index is 30.26 kg/(m^2). Tech Comments:  Took all am medications per patient. This patient sat and rested until his BP came down to 146/86 before he walked on the treadmill.    Nuclear Med Study 1 or 2 day study: 1 day  Stress Test Type:  Stress  Reading MD: N/A  Order Authorizing Provider:  Verne Carrow, MD  Resting Radionuclide: Technetium 25m Sestamibi  Resting Radionuclide Dose: 11.0 mCi   Stress Radionuclide:  Technetium 73m Sestamibi  Stress Radionuclide Dose: 33.0 mCi           Stress Protocol Rest HR: 68 Stress HR: 153  Rest BP: 165/116 Stress BP: 207/87  Exercise Time (min): n/a METS: 10.90   Predicted Max HR: 170 bpm % Max HR: 90 bpm Rate Pressure Product: 61443   Dose of Adenosine (mg):  n/a Dose of Lexiscan: n/a mg  Dose of Atropine (mg): n/a Dose of Dobutamine: n/a mcg/kg/min (at max HR)  Stress Test Technologist: Milana Na, EMT-P  Nuclear Technologist:  Domenic Polite, CNMT     Rest Procedure:  Myocardial perfusion imaging was performed at rest 45 minutes following the intravenous  administration of Technetium 65m Sestamibi. Rest ECG: NSR, old anterior MI.   Stress Procedure:  The patient exercised on the treadmill utilizing the Bruce Protocol for 9:30 minutes. The patient stopped due to fatigue and denied any chest pain.  Technetium 70m Sestamibi was injected at peak exercise and myocardial perfusion imaging was performed after a brief delay. Stress ECG: No significant change from baseline ECG  QPS Raw Data Images:  Normal; no motion artifact; normal heart/lung ratio. Stress Images:  Large, severe basal to mid anterior/anteroseptal, apical inferior and apical lateral, and true apex perfusion defect.  Rest Images:  Large, severe basal to mid anterior/anteroseptal, apical inferior and apical lateral, and true apex perfusion defect.  Subtraction (SDS):  Fixed large, severe basal to mid anterior/anteroseptal, apical inferior and apical lateral, and true apex perfusion defect.  Transient Ischemic Dilatation (Normal <1.22):  0.97 Lung/Heart Ratio (Normal <0.45):  0.38  Quantitative Gated Spect Images QGS EDV:  190 ml QGS ESV:  125 ml  Impression Exercise Capacity:  Good exercise capacity. BP Response:  Hypertensive blood pressure response. Clinical Symptoms:  Fatigue.  ECG Impression:  NSR with old anterior MI, no change with exercise.  Comparison with Prior Nuclear Study: No images to compare  Overall Impression:  Intermediate risk stress nuclear study with a large, severe basal to mid anterior/anteroseptal, apical inferior and apical lateral, and true  apex perfusion defect. This suggests infarction without significant ischemia.  LV Ejection Fraction: 34%.  LV Wall Motion:  Periapical severe hypokinesis.   Marca AnconaDalton McLean 06/23/2013

## 2013-06-25 ENCOUNTER — Telehealth: Payer: Self-pay | Admitting: Cardiovascular Disease

## 2013-06-25 NOTE — Telephone Encounter (Signed)
Spoke with pt and reviewed stress test results with him.  

## 2013-06-25 NOTE — Telephone Encounter (Signed)
Patient is returning your call. Please call back.  °

## 2013-07-10 ENCOUNTER — Telehealth: Payer: Self-pay | Admitting: Cardiovascular Disease

## 2013-07-28 ENCOUNTER — Encounter: Payer: Self-pay | Admitting: Cardiovascular Disease

## 2013-07-28 ENCOUNTER — Ambulatory Visit (INDEPENDENT_AMBULATORY_CARE_PROVIDER_SITE_OTHER): Payer: Federal, State, Local not specified - PPO | Admitting: Cardiovascular Disease

## 2013-07-28 VITALS — BP 137/87 | HR 46 | Ht 69.0 in | Wt 212.0 lb

## 2013-07-28 DIAGNOSIS — I2589 Other forms of chronic ischemic heart disease: Secondary | ICD-10-CM

## 2013-07-28 DIAGNOSIS — I255 Ischemic cardiomyopathy: Secondary | ICD-10-CM

## 2013-07-28 DIAGNOSIS — I2510 Atherosclerotic heart disease of native coronary artery without angina pectoris: Secondary | ICD-10-CM

## 2013-07-28 DIAGNOSIS — E785 Hyperlipidemia, unspecified: Secondary | ICD-10-CM

## 2013-07-28 DIAGNOSIS — I10 Essential (primary) hypertension: Secondary | ICD-10-CM

## 2013-07-28 NOTE — Progress Notes (Signed)
History of Present Illness: 53 yo male with history of CAD, ischemic cardiomyopathy, HTN, HLD who returns for follow up. He was admitted to [facility] October 04, 2012 for left heart cath which showed LAD stents patent, mid circumflex 50%, proximal RCA 95-99%, mid RCA 75%, EF 45% with inferior HK. PCI: Promus premier DES to proximal RCA. He had a BMS placed in the LAD in 2008. Last echo 2009: EF 35-40%, periapical AK, ant HK, mild LAE. He did not tolerate Ace-inhibitors due to cough. He is not on a beta blocker due to bradycardia. He did not tolerate ARB secondary to dizziness. I saw him 06/05/13 and he c/o SOB and sharp left sided chest pains as well as fatigue. I arranged a stress myoview on 06/24/13 which did not show ischemia, LVEF=34%.   He is here today for follow up. He is feeling much better. No chest pain or SOB. Weight is stable.   Primary Care Physician: Lupe Carney  Last Lipid Profile:Lipid Panel     Component Value Date/Time   CHOL 136 09/03/2012 0951   TRIG 95.0 09/03/2012 0951   HDL 43.20 09/03/2012 0951   CHOLHDL 3 09/03/2012 0951   VLDL 19.0 09/03/2012 0951   LDLCALC 74 09/03/2012 0951   Past Medical History  Diagnosis Date  . OSA (obstructive sleep apnea)     mild  . Allergic rhinitis   . Hyperlipidemia   . Hypertension   . MI (myocardial infarction) 03/2007  . Asthma     "grew out of it" (10/04/2012)  . Pneumonia     "as a child" (10/04/2012)  . Coronary artery disease     a. s/p prior stent to LAD;  b. LHC 6/14: LAD stent patent, mCFX 50%, pRCA 95-99%, mRCA 75%, EF 45% with inf HK => PCI: Promus DES to pRCA  . Arthritis     "lower back, right thumb, knees" (10/04/2012)  . Depression   . Anxiety     Past Surgical History  Procedure Laterality Date  . Cardiac catheterization  2010  . Coronary angioplasty with stent placement  2008; 10/04/2012    "1 + 1" (10/04/2012)  . Exploratory laparotomy  1990's?    "went in to repair hernia; found fatty tissue instead; no hernia repair"  (10/04/2012)  . Arthroplasty Right 1993    'crushed; removed bone fragments" (10/04/2012)    Current Outpatient Prescriptions  Medication Sig Dispense Refill  . aliskiren (TEKTURNA) 300 MG tablet Take 300 mg by mouth daily.       Marland Kitchen amLODipine (NORVASC) 10 MG tablet Take 1 tablet (10 mg total) by mouth daily.  90 tablet  3  . aspirin 81 MG tablet Take 81 mg by mouth daily.       . citalopram (CELEXA) 20 MG tablet Take 20 mg by mouth daily.       . clopidogrel (PLAVIX) 75 MG tablet Take 1 tablet (75 mg total) by mouth daily with breakfast.  30 tablet  11  . hydrochlorothiazide (HYDRODIURIL) 25 MG tablet TAKE 1 TABLET (25 MG TOTAL) BY MOUTH DAILY.  30 tablet  3  . HYDROcodone-acetaminophen (NORCO/VICODIN) 5-325 MG per tablet Take 1 tablet by mouth every 4 (four) hours as needed for pain.  15 tablet  0  . nitroGLYCERIN (NITROSTAT) 0.4 MG SL tablet Place 1 tablet (0.4 mg total) under the tongue every 5 (five) minutes as needed for chest pain.  25 tablet  11  . pravastatin (PRAVACHOL) 80 MG tablet Take  80 mg by mouth daily.       No current facility-administered medications for this visit.    No Known Allergies  History   Social History  . Marital Status: Married    Spouse Name: N/A    Number of Children: 1  . Years of Education: N/A   Occupational History  .  [employer]   Social History Main Topics  . Smoking status: Former Smoker -- 0.50 packs/day for 20 years    Types: Cigarettes    Quit date: 12/31/1998  . Smokeless tobacco: Never Used  . Alcohol Use: 0.0 oz/week     Comment: 10/04/2012 "1 beer plus 2 mixed drinks once/month"  . Drug Use: No  . Sexual Activity: Yes   Other Topics Concern  . Not on file   Social History Narrative  . No narrative on file    Family History  Problem Relation Age of Onset  . Atrial fibrillation Mother     irregular heart beats  . CAD Maternal Grandfather   . CAD Maternal Grandmother     Review of Systems:  As stated in the HPI and  otherwise negative.   BP 137/87  Pulse 46  Ht 5\' 9"  (1.753 m)  Wt 212 lb (96.163 kg)  BMI 31.29 kg/m2  Physical Examination: General: Well developed, well nourished, NAD HEENT: OP clear, mucus membranes moist SKIN: warm, dry. No rashes. Neuro: No focal deficits Musculoskeletal: Muscle strength 5/5 all ext Psychiatric: Mood and affect normal Neck: No JVD, no carotid bruits, no thyromegaly, no lymphadenopathy. Lungs:Clear bilaterally, no wheezes, rhonci, crackles Cardiovascular: Regular rate and rhythm. No murmurs, gallops or rubs. Abdomen:Soft. Bowel sounds present. Non-tender.  Extremities: No lower extremity edema. Pulses are 2 + in the bilateral DP/PT.  Cardiac cath 10/04/12:  Left mainstem: Widely patent with no obstructive disease  Left anterior descending (LAD): This is a large vessel that wraps around the left ventricular apex. There is no obstructive disease throughout the course of the LAD. The stent is difficult to visualize but I do not appreciate any in-stent restenosis. There are minor irregularities throughout.  Left circumflex (LCx): The left circumflex is patent. The first obtuse marginal was patent. The AV groove circumflex is patent down to the mid vessel with minor irregularity. The mid circumflex extending into an obtuse marginal branch has a 50% stenosis. The second OM branch is large in caliber.  Right coronary artery (RCA): There is diffuse disease present. The proximal RCA has severe 95-99% stenosis. The mid RCA has diffuse 75% stenosis. The distal vessel has diffuse irregularity. The PDA and PLA branches are patent with minor irregularities.  Left ventriculography: Left ventricular systolic function is moderately depressed. There is inferior wall hypokinesis from the base to the apex. The left ventricular ejection fraction is estimated at 45%.  PCI Note: Following the diagnostic procedure, the decision was made to proceed with PCI. The radial sheath was upsized to a  6 [place]. Weight-based bivalirudin was given for anticoagulation. The patient was given Plavix 600 mg on the table. Once a therapeutic ACT was achieved, a 6 [place] AL-1 guide catheter was inserted. A BMW coronary guidewire was used to cross the lesion. The lesion was predilated with a 2.5 mm balloon. The lesion was then stented with a 3.0 x 28 mm Promus premier drug-eluting stent. The stent was postdilated with a 3.25 mm noncompliant balloon to 16 atmospheres. Following PCI, there was 0% residual stenosis and TIMI-3 flow. Final angiography confirmed an excellent  result. The patient tolerated the procedure well. There were no immediate procedural complications. A TR band was used for radial hemostasis. The patient was transferred to the post catheterization recovery area for further monitoring.  PCI Data:  Vessel - RCA/Segment - proximal  Percent Stenosis (pre) 99  TIMI-flow 3  Stent 3.0 x 28 mm Promus premier drug-eluting  Percent Stenosis (post) 0  TIMI-flow (post) 3  Final Conclusions:  1. Severe diffuse proximal and mid RCA stenosis treated successfully with PCI  2. Continued patency of the stented segment in the LAD  3. Moderate segmental LV systolic dysfunction   Stress myoview: 06/24/13: Stress Procedure: The patient exercised on the treadmill utilizing the Bruce Protocol for 9:30 minutes. The patient stopped due to fatigue and denied any chest pain. Technetium 72m Sestamibi was injected at peak exercise and myocardial perfusion imaging was performed after a brief delay.  Stress ECG: No significant change from baseline ECG  QPS  Raw Data Images: Normal; no motion artifact; normal heart/lung ratio.  Stress Images: Large, severe basal to mid anterior/anteroseptal, apical inferior and apical lateral, and true apex perfusion defect.  Rest Images: Large, severe basal to mid anterior/anteroseptal, apical inferior and apical lateral, and true apex perfusion defect.  Subtraction (SDS): Fixed large,  severe basal to mid anterior/anteroseptal, apical inferior and apical lateral, and true apex perfusion defect.  Transient Ischemic Dilatation (Normal <1.22): 0.97  Lung/Heart Ratio (Normal <0.45): 0.38  Quantitative Gated Spect Images  QGS EDV: 190 ml  QGS ESV: 125 ml  Impression  Exercise Capacity: Good exercise capacity.  BP Response: Hypertensive blood pressure response.  Clinical Symptoms: Fatigue.  ECG Impression: NSR with old anterior MI, no change with exercise.  Comparison with Prior Nuclear Study: No images to compare  Overall Impression: Intermediate risk stress nuclear study with a large, severe basal to mid anterior/anteroseptal, apical inferior and apical lateral, and true apex perfusion defect. This suggests infarction without significant ischemia.  LV Ejection Fraction: 34%. LV Wall Motion: Periapical severe hypokinesis.   EKG: Sinus brady, rate 46 bpm. 1st degree AV block. Non-specific T wave abnormalities. Unchanged.   Assessment and Plan:   1. CAD: Stable. Stress myoview 06/24/13 without ischemia. Continue medical management. No beta blocker with bradycardia.   2. HTN: BP controlled. No changes today.   3. HLD: Lipids well controlled. Continue statin.   4. Ischemic cardiomyopathy: LVEF=34% by [facility] February 2015. He does tolerate Ace-inh, ARB or beta blockers.

## 2013-07-28 NOTE — Patient Instructions (Signed)
Your physician wants you to follow-up in:  12 months.  You will receive a reminder letter in the mail two months in advance. If you don't receive a letter, please call our office to schedule the follow-up appointment.   

## 2013-08-01 ENCOUNTER — Telehealth: Payer: Self-pay | Admitting: Cardiovascular Disease

## 2013-08-01 NOTE — Telephone Encounter (Signed)
Walk In pt Form " FMLA" Dropped Off Sent to [employer] No ROI/PMT

## 2013-08-24 ENCOUNTER — Other Ambulatory Visit: Payer: Self-pay | Admitting: Cardiovascular Disease

## 2013-09-03 ENCOUNTER — Telehealth: Payer: Self-pay | Admitting: Cardiovascular Disease

## 2013-09-03 NOTE — Telephone Encounter (Signed)
New problem   Pt has been under a lot of stress and need work note to be out of work. Please call pt concerning this issue.

## 2013-09-03 NOTE — Telephone Encounter (Signed)
Spoke with pt who is requesting note from Dr. Clifton James today indicating he should be out of work for 6-8 weeks due to stress. Pt reports he has shortness of breath and chest pains at times but note needs to indicate he is being taken out of work due to stress of his job. He reports his job is not taking his health conditions into consideration. He is going to see his primary MD today and will call us back after this visit to let us know if note from Dr. Clifton James is needed.

## 2013-09-12 ENCOUNTER — Telehealth: Payer: Self-pay | Admitting: Cardiovascular Disease

## 2013-09-12 ENCOUNTER — Other Ambulatory Visit: Payer: Self-pay | Admitting: Cardiology

## 2013-09-12 ENCOUNTER — Encounter: Payer: Self-pay | Admitting: *Deleted

## 2013-09-12 NOTE — Telephone Encounter (Signed)
Dr. Clifton James has previously written letter for pt outlining best hours for pt to work due to his heart condition. Pt's wife is requesting updated letter indicating pt work no overnight shifts due to his heart disease and the disruption of his sleep pattern when working overnight.  Will write new letter and leave at front desk for her to pick up.

## 2013-09-12 NOTE — Telephone Encounter (Signed)
New message   A note was written regarding hours.     Wife is asking the time to changes patient can work   8 am - 11 pm   An a new note be generated to show new work time.

## 2013-10-20 ENCOUNTER — Other Ambulatory Visit (HOSPITAL_COMMUNITY): Payer: Self-pay | Admitting: Physician Assistant

## 2013-10-21 ENCOUNTER — Telehealth: Payer: Self-pay | Admitting: *Deleted

## 2013-10-21 NOTE — Telephone Encounter (Signed)
Left message on pt's home phone and wife's cell phone that completed paperwork was at front desk for pick up. No answer on pt's cell phone.

## 2013-10-23 NOTE — Telephone Encounter (Signed)
I checked folder and paperwork has been picked up

## 2013-12-01 ENCOUNTER — Other Ambulatory Visit (HOSPITAL_COMMUNITY): Payer: Self-pay | Admitting: Physician Assistant

## 2013-12-04 ENCOUNTER — Other Ambulatory Visit: Payer: Self-pay | Admitting: Cardiovascular Disease

## 2013-12-10 ENCOUNTER — Encounter: Payer: Self-pay | Admitting: Cardiovascular Disease

## 2013-12-16 ENCOUNTER — Other Ambulatory Visit: Payer: Self-pay | Admitting: Cardiovascular Disease

## 2014-01-09 ENCOUNTER — Telehealth: Payer: Self-pay | Admitting: Cardiovascular Disease

## 2014-01-09 NOTE — Telephone Encounter (Signed)
Received request from Nurse fax box, documents faxed for surgical clearance. To: Jarold Song Fax number: [phone] Attention [phone]

## 2014-04-09 ENCOUNTER — Encounter (HOSPITAL_COMMUNITY): Payer: Self-pay | Admitting: Cardiovascular Disease

## 2014-04-20 ENCOUNTER — Other Ambulatory Visit: Payer: Self-pay | Admitting: Cardiovascular Disease

## 2014-04-22 ENCOUNTER — Other Ambulatory Visit: Payer: Self-pay | Admitting: Cardiovascular Disease

## 2014-04-23 NOTE — Telephone Encounter (Signed)
Rx refill denied to patient pharmacy  Rx was filled 04/20/14

## 2014-08-10 ENCOUNTER — Ambulatory Visit (INDEPENDENT_AMBULATORY_CARE_PROVIDER_SITE_OTHER): Payer: Federal, State, Local not specified - PPO | Admitting: Cardiovascular Disease

## 2014-08-10 ENCOUNTER — Encounter: Payer: Self-pay | Admitting: Cardiovascular Disease

## 2014-08-10 VITALS — BP 140/88 | HR 49 | Ht 69.0 in | Wt 209.2 lb

## 2014-08-10 DIAGNOSIS — I2510 Atherosclerotic heart disease of native coronary artery without angina pectoris: Secondary | ICD-10-CM

## 2014-08-10 DIAGNOSIS — I10 Essential (primary) hypertension: Secondary | ICD-10-CM

## 2014-08-10 DIAGNOSIS — I255 Ischemic cardiomyopathy: Secondary | ICD-10-CM | POA: Diagnosis not present

## 2014-08-10 DIAGNOSIS — E785 Hyperlipidemia, unspecified: Secondary | ICD-10-CM

## 2014-08-10 NOTE — Progress Notes (Signed)
Chief Complaint  Patient presents with  . Follow-up     History of Present Illness: 54 yo male with history of CAD, ischemic cardiomyopathy, HTN, HLD who returns for follow up. He was admitted to [facility] October 04, 2012 for left heart cath which showed LAD stents patent, mid circumflex 50%, proximal RCA 95-99%, mid RCA 75%, EF 45% with inferior HK. PCI: Promus Premier DES to proximal RCA. He had a BMS placed in the LAD in 2008. Last echo 2009: EF 35-40%, periapical AK, ant HK, mild LAE. He did not tolerate Ace-inhibitors due to cough. He is not on a beta blocker due to bradycardia. He did not tolerate ARB secondary to dizziness. I saw him 06/05/13 and he c/o SOB and sharp left sided chest pains as well as fatigue. I arranged a stress myoview on 06/24/13 which did not show ischemia, LVEF=34%.   He is here today for follow up. No chest pain or SOB. Weight is stable.   Primary Care Physician: Lupe Carney  Last Lipid Profile:Followed in primary care.   Past Medical History  Diagnosis Date  . OSA (obstructive sleep apnea)     mild  . Allergic rhinitis   . Hyperlipidemia   . Hypertension   . MI (myocardial infarction) 03/2007  . Asthma     "grew out of it" (10/04/2012)  . Pneumonia     "as a child" (10/04/2012)  . Coronary artery disease     a. s/p prior stent to LAD;  b. LHC 6/14: LAD stent patent, mCFX 50%, pRCA 95-99%, mRCA 75%, EF 45% with inf HK => PCI: Promus DES to pRCA  . Arthritis     "lower back, right thumb, knees" (10/04/2012)  . Depression   . Anxiety     Past Surgical History  Procedure Laterality Date  . Cardiac catheterization  2010  . Coronary angioplasty with stent placement  2008; 10/04/2012    "1 + 1" (10/04/2012)  . Exploratory laparotomy  1990's?    "went in to repair hernia; found fatty tissue instead; no hernia repair" (10/04/2012)  . Arthroplasty Right 1993    'crushed; removed bone fragments" (10/04/2012)  . Left heart catheterization with coronary angiogram N/A  10/04/2012    Procedure: LEFT HEART CATHETERIZATION WITH CORONARY ANGIOGRAM;  Surgeon: Tonny Bollman, MD;  Location: [facility] CATH LAB;  Service: Cardiovascular;  Laterality: N/A;    Current Outpatient Prescriptions  Medication Sig Dispense Refill  . aliskiren (TEKTURNA) 300 MG tablet Take 300 mg by mouth daily.     Marland Kitchen amLODipine (NORVASC) 10 MG tablet TAKE 1 TABLET (10 MG TOTAL) BY MOUTH DAILY. 90 tablet 3  . aspirin 81 MG tablet Take 81 mg by mouth daily.     . citalopram (CELEXA) 20 MG tablet Take 20 mg by mouth daily.     . clopidogrel (PLAVIX) 75 MG tablet TAKE 1 TABLET (75 MG TOTAL) BY MOUTH DAILY WITH BREAKFAST. 30 tablet 5  . etodolac (LODINE) 400 MG tablet Take 1 tablet by mouth 2 (two) times daily as needed. For  pain  0  . hydrochlorothiazide (HYDRODIURIL) 25 MG tablet TAKE 1 TABLET (25 MG TOTAL) BY MOUTH DAILY. 30 tablet 3  . HYDROcodone-acetaminophen (NORCO/VICODIN) 5-325 MG per tablet Take 1 tablet by mouth every 4 (four) hours as needed for pain. 15 tablet 0  . NITROSTAT 0.4 MG SL tablet PLACE 1 TABLET UNDER THE TONGUE EVERY 5 MINUTES AS NEEDED FOR CHEST PAIN. 25 tablet 1  . pravastatin (PRAVACHOL)  80 MG tablet Take 80 mg by mouth daily.     No current facility-administered medications for this visit.    No Known Allergies  History   Social History  . Marital Status: Married    Spouse Name: N/A  . Number of Children: 1  . Years of Education: N/A   Occupational History  .  [employer]   Social History Main Topics  . Smoking status: Former Smoker -- 0.50 packs/day for 20 years    Types: Cigarettes    Quit date: 12/31/1998  . Smokeless tobacco: Never Used  . Alcohol Use: 0.0 oz/week     Comment: 10/04/2012 "1 beer plus 2 mixed drinks once/month"  . Drug Use: No  . Sexual Activity: Yes   Other Topics Concern  . Not on file   Social History Narrative    Family History  Problem Relation Age of Onset  . Atrial fibrillation Mother     irregular heart beats  .  Hypertension Mother   . Diabetes type II Mother   . CAD Maternal Grandfather   . CAD Maternal Grandmother     Review of Systems:  As stated in the HPI and otherwise negative.   BP 140/88 mmHg  Pulse 49  Ht  (1.753 m)  Wt 209 lb 3.2 oz (94.892 kg)  BMI 30.88 kg/m2  Physical Examination: General: Well developed, well nourished, NAD HEENT: OP clear, mucus membranes moist SKIN: warm, dry. No rashes. Neuro: No focal deficits Musculoskeletal: Muscle strength 5/5 all ext Psychiatric: Mood and affect normal Neck: No JVD, no carotid bruits, no thyromegaly, no lymphadenopathy. Lungs:Clear bilaterally, no wheezes, rhonci, crackles Cardiovascular: Regular rate and rhythm. No murmurs, gallops or rubs. Abdomen:Soft. Bowel sounds present. Non-tender.  Extremities: No lower extremity edema. Pulses are 2 + in the bilateral DP/PT.  Cardiac cath 10/04/12:  Left mainstem: Widely patent with no obstructive disease  Left anterior descending (LAD): This is a large vessel that wraps around the left ventricular apex. There is no obstructive disease throughout the course of the LAD. The stent is difficult to visualize but I do not appreciate any in-stent restenosis. There are minor irregularities throughout.  Left circumflex (LCx): The left circumflex is patent. The first obtuse marginal was patent. The AV groove circumflex is patent down to the mid vessel with minor irregularity. The mid circumflex extending into an obtuse marginal branch has a 50% stenosis. The second OM branch is large in caliber.  Right coronary artery (RCA): There is diffuse disease present. The proximal RCA has severe 95-99% stenosis. The mid RCA has diffuse 75% stenosis. The distal vessel has diffuse irregularity. The PDA and PLA branches are patent with minor irregularities.  Left ventriculography: Left ventricular systolic function is moderately depressed. There is inferior wall hypokinesis from the base to the apex. The left  ventricular ejection fraction is estimated at 45%.  PCI Note: Following the diagnostic procedure, the decision was made to proceed with PCI. The radial sheath was upsized to a 6 [place]. Weight-based bivalirudin was given for anticoagulation. The patient was given Plavix 600 mg on the table. Once a therapeutic ACT was achieved, a 6 [place] AL-1 guide catheter was inserted. A BMW coronary guidewire was used to cross the lesion. The lesion was predilated with a 2.5 mm balloon. The lesion was then stented with a 3.0 x 28 mm Promus premier drug-eluting stent. The stent was postdilated with a 3.25 mm noncompliant balloon to 16 atmospheres. Following PCI, there was 0%  residual stenosis and TIMI-3 flow. Final angiography confirmed an excellent result. The patient tolerated the procedure well. There were no immediate procedural complications. A TR band was used for radial hemostasis. The patient was transferred to the post catheterization recovery area for further monitoring.  PCI Data:  Vessel - RCA/Segment - proximal  Percent Stenosis (pre) 99  TIMI-flow 3  Stent 3.0 x 28 mm Promus premier drug-eluting  Percent Stenosis (post) 0  TIMI-flow (post) 3  Final Conclusions:  1. Severe diffuse proximal and mid RCA stenosis treated successfully with PCI  2. Continued patency of the stented segment in the LAD  3. Moderate segmental LV systolic dysfunction   Stress myoview: 06/24/13: Stress Procedure: The patient exercised on the treadmill utilizing the Bruce Protocol for 9:30 minutes. The patient stopped due to fatigue and denied any chest pain. Technetium 79m Sestamibi was injected at peak exercise and myocardial perfusion imaging was performed after a brief delay.  Stress ECG: No significant change from baseline ECG  QPS  Raw Data Images: Normal; no motion artifact; normal heart/lung ratio.  Stress Images: Large, severe basal to mid anterior/anteroseptal, apical inferior and apical lateral, and true apex  perfusion defect.  Rest Images: Large, severe basal to mid anterior/anteroseptal, apical inferior and apical lateral, and true apex perfusion defect.  Subtraction (SDS): Fixed large, severe basal to mid anterior/anteroseptal, apical inferior and apical lateral, and true apex perfusion defect.  Transient Ischemic Dilatation (Normal <1.22): 0.97  Lung/Heart Ratio (Normal <0.45): 0.38  Quantitative Gated Spect Images  QGS EDV: 190 ml  QGS ESV: 125 ml  Impression  Exercise Capacity: Good exercise capacity.  BP Response: Hypertensive blood pressure response.  Clinical Symptoms: Fatigue.  ECG Impression: NSR with old anterior MI, no change with exercise.  Comparison with Prior Nuclear Study: No images to compare  Overall Impression: Intermediate risk stress nuclear study with a large, severe basal to mid anterior/anteroseptal, apical inferior and apical lateral, and true apex perfusion defect. This suggests infarction without significant ischemia.  LV Ejection Fraction: 34%. LV Wall Motion: Periapical severe hypokinesis.   EKG:  EKG is ordered today. The ekg ordered today demonstrates Sinus brady, rate 49 bpm. 1st degree AV block. Poor R wave progressin c/w prior infarct. Diffuse T wave flattening, unchanged.   Recent Labs: No results found for requested labs within last 365 days.   Lipid Panel    Component Value Date/Time   CHOL 136 09/03/2012 0951   TRIG 95.0 09/03/2012 0951   HDL 43.20 09/03/2012 0951   CHOLHDL 3 09/03/2012 0951   VLDL 19.0 09/03/2012 0951   LDLCALC 74 09/03/2012 0951   LDLDIRECT 59.9 06/03/2007 1114     Wt Readings from Last 3 Encounters:  08/10/14 209 lb 3.2 oz (94.892 kg)  07/28/13 212 lb (96.163 kg)  06/23/13 205 lb (92.987 kg)     Other studies Reviewed: Additional studies/ records that were reviewed today include: . Review of the above records demonstrates:   Assessment and Plan:   1. CAD: Stable. Stress myoview 06/24/13 without ischemia. Continue  medical management with ASA, statin, Norvasx.  No beta blocker with bradycardia.    2. HTN: BP controlled. No changes today.   3. HLD: Lipids well controlled in primary care. Continue statin.   4. Ischemic cardiomyopathy: LVEF=34% by [facility] February 2015. He does tolerate Ace-inh, ARB or beta blockers.  He does not wish to start even low dose of Ace-inh or ARB again.   Current medicines are reviewed at length with  the patient today.  The patient does not have concerns regarding medicines.  The following changes have been made:  no change  Labs/ tests ordered today include:  No orders of the defined types were placed in this encounter.    Disposition:   FU with me in 12 months  Signed, Verne Carrow, MD 08/10/2014 3:41 PM    [facility] [address] [place], [place], [place]  [address] Phone: [phone]; Fax: [phone]

## 2014-08-10 NOTE — Patient Instructions (Signed)
Medication Instructions:  Your physician recommends that you continue on your current medications as directed. Please refer to the Current Medication list given to you today.   Labwork: none  Testing/Procedures: None   Follow-Up: Your physician wants you to follow-up in: 12 months.  You will receive a reminder letter in the mail two months in advance. If you don't receive a letter, please call our office to schedule the follow-up appointment.       

## 2014-08-19 ENCOUNTER — Other Ambulatory Visit: Payer: Self-pay | Admitting: Cardiovascular Disease

## 2014-08-21 ENCOUNTER — Other Ambulatory Visit: Payer: Self-pay | Admitting: Cardiovascular Disease

## 2014-09-18 ENCOUNTER — Other Ambulatory Visit: Payer: Self-pay | Admitting: Cardiovascular Disease

## 2014-10-15 ENCOUNTER — Other Ambulatory Visit: Payer: Self-pay | Admitting: Cardiovascular Disease

## 2014-10-29 ENCOUNTER — Other Ambulatory Visit: Payer: Self-pay | Admitting: Cardiovascular Disease

## 2015-02-05 ENCOUNTER — Ambulatory Visit: Payer: Federal, State, Local not specified - PPO | Admitting: Cardiovascular Disease

## 2015-03-31 ENCOUNTER — Other Ambulatory Visit: Payer: Self-pay | Admitting: Cardiovascular Disease

## 2015-05-27 ENCOUNTER — Encounter: Payer: Self-pay | Admitting: Cardiovascular Disease

## 2015-06-05 ENCOUNTER — Other Ambulatory Visit: Payer: Self-pay | Admitting: Cardiovascular Disease

## 2015-08-09 ENCOUNTER — Telehealth: Payer: Self-pay | Admitting: Cardiovascular Disease

## 2015-08-09 NOTE — Telephone Encounter (Signed)
New Message  Pt called request a call back to discuss seeing Dr. Clifton James and not a PA or NP.  Pt received a call that Dr. Gibson Ramp schedule has changed. Offered PA or Np for a soon appt/ Pt Declined. Please assist

## 2015-08-09 NOTE — Telephone Encounter (Signed)
Called patient line was busy, will try again later.

## 2015-08-10 NOTE — Telephone Encounter (Signed)
LMTCB

## 2015-08-12 ENCOUNTER — Other Ambulatory Visit: Payer: Self-pay | Admitting: Cardiovascular Disease

## 2015-08-12 NOTE — Telephone Encounter (Signed)
I spoke with the pt's wife and appointment scheduled for 08/16/15 with Dr Clifton James.

## 2015-08-16 ENCOUNTER — Encounter: Payer: Self-pay | Admitting: Cardiovascular Disease

## 2015-08-16 ENCOUNTER — Ambulatory Visit (INDEPENDENT_AMBULATORY_CARE_PROVIDER_SITE_OTHER): Payer: Federal, State, Local not specified - PPO | Admitting: Cardiovascular Disease

## 2015-08-16 VITALS — BP 136/78 | HR 50 | Ht 69.0 in | Wt 205.0 lb

## 2015-08-16 DIAGNOSIS — I2510 Atherosclerotic heart disease of native coronary artery without angina pectoris: Secondary | ICD-10-CM | POA: Diagnosis not present

## 2015-08-16 DIAGNOSIS — I10 Essential (primary) hypertension: Secondary | ICD-10-CM

## 2015-08-16 DIAGNOSIS — E785 Hyperlipidemia, unspecified: Secondary | ICD-10-CM | POA: Diagnosis not present

## 2015-08-16 DIAGNOSIS — I255 Ischemic cardiomyopathy: Secondary | ICD-10-CM

## 2015-08-16 MED ORDER — ISOSORBIDE MONONITRATE ER 30 MG PO TB24: 30.0000 mg | ORAL_TABLET | Freq: Every day | ORAL | Status: DC

## 2015-08-16 NOTE — Patient Instructions (Addendum)
Medication Instructions:  Your physician has recommended you make the following change in your medication: Start Isosorbide Mononitrate 30 mg by mouth daily.   Labwork: none  Testing/Procedures: none  Follow-Up: Your physician recommends that you schedule a follow-up appointment in: about one month. --Scheduled for May 26,2017 at 9:30   Any Other Special Instructions Will Be Listed Below (If Applicable).     If you need a refill on your cardiac medications before your next appointment, please call your pharmacy.

## 2015-08-16 NOTE — Progress Notes (Signed)
Chief Complaint  Patient presents with  . Follow-up    pt c/o sob and chest pain   . Coronary Artery Disease     History of Present Illness: 55 yo male with history of CAD, ischemic cardiomyopathy, HTN, HLD who returns for follow up. He had a BMS placed in the LAD in 2008. He was admitted to [facility] October 04, 2012 for left heart cath which showed LAD stents patent, mid circumflex 50%, proximal RCA 95-99%, mid RCA 75%, EF 45% with inferior HK. PCI: Promus Premier DES to proximal RCA.  Last echo 2009: EF 35-40%, periapical AK, ant HK, mild LAE. He did not tolerate Ace-inhibitors due to cough. He is not on a beta blocker due to bradycardia. He did not tolerate ARB secondary to dizziness. I saw him 06/05/13 and he c/o SOB and sharp left sided chest pains as well as fatigue. I arranged a stress myoview on 06/24/13 which did not show ischemia, LVEF=34%.   He is here today for follow up. He has been feeling poorly for one month. He has been out of work due to stress. He has no chest pain or SOB. Weight is stable. He endorses heavy sweating with exertion and overall feels fatigued. He believes his job is causing him too much stress. He had severe chest pain before his coronary stents were placed.   Primary Care Physician: Lupe Carney  Past Medical History  Diagnosis Date  . OSA (obstructive sleep apnea)     mild  . Allergic rhinitis   . Hyperlipidemia   . Hypertension   . MI (myocardial infarction) (HCC) 03/2007  . Asthma     "grew out of it" (10/04/2012)  . Pneumonia     "as a child" (10/04/2012)  . Coronary artery disease     a. s/p prior stent to LAD;  b. LHC 6/14: LAD stent patent, mCFX 50%, pRCA 95-99%, mRCA 75%, EF 45% with inf HK => PCI: Promus DES to pRCA  . Arthritis     "lower back, right thumb, knees" (10/04/2012)  . Depression   . Anxiety     Past Surgical History  Procedure Laterality Date  . Cardiac catheterization  2010  . Coronary angioplasty with stent placement  2008; 10/04/2012     "1 + 1" (10/04/2012)  . Exploratory laparotomy  1990's?    "went in to repair hernia; found fatty tissue instead; no hernia repair" (10/04/2012)  . Arthroplasty Right 1993    'crushed; removed bone fragments" (10/04/2012)  . Left heart catheterization with coronary angiogram N/A 10/04/2012    Procedure: LEFT HEART CATHETERIZATION WITH CORONARY ANGIOGRAM;  Surgeon: Tonny Bollman, MD;  Location: [facility] CATH LAB;  Service: Cardiovascular;  Laterality: N/A;    Current Outpatient Prescriptions  Medication Sig Dispense Refill  . aliskiren (TEKTURNA) 300 MG tablet Take 300 mg by mouth daily.     Marland Kitchen amLODipine (NORVASC) 10 MG tablet TAKE 1 TABLET (10 MG TOTAL) BY MOUTH DAILY. 90 tablet 3  . aspirin 81 MG tablet Take 81 mg by mouth daily.     . citalopram (CELEXA) 20 MG tablet Take 20 mg by mouth daily.     . clopidogrel (PLAVIX) 75 MG tablet TAKE 1 TABLET BY MOUTH EVERY DAY WITH BREAKFAST 30 tablet 1  . etodolac (LODINE) 400 MG tablet Take 1 tablet by mouth 2 (two) times daily as needed. For  pain  0  . hydrochlorothiazide (HYDRODIURIL) 25 MG tablet TAKE 1 TABLET (25 MG TOTAL) BY  MOUTH DAILY. 30 tablet 4  . HYDROcodone-acetaminophen (NORCO/VICODIN) 5-325 MG per tablet Take 1 tablet by mouth every 4 (four) hours as needed for pain. 15 tablet 0  . NITROSTAT 0.4 MG SL tablet PLACE 1 TABLET UNDER THE TONGUE EVERY 5 MINUTES AS NEEDED FOR CHEST PAIN. 25 tablet 3  . pravastatin (PRAVACHOL) 80 MG tablet Take 80 mg by mouth daily.     No current facility-administered medications for this visit.    No Known Allergies  Social History   Social History  . Marital Status: Married    Spouse Name: N/A  . Number of Children: 1  . Years of Education: N/A   Occupational History  .  [employer]   Social History Main Topics  . Smoking status: Former Smoker -- 0.50 packs/day for 20 years    Types: Cigarettes    Quit date: 12/31/1998  . Smokeless tobacco: Never Used  . Alcohol Use: 0.0 oz/week     Comment:  10/04/2012 "1 beer plus 2 mixed drinks once/month"  . Drug Use: No  . Sexual Activity: Yes   Other Topics Concern  . Not on file   Social History Narrative    Family History  Problem Relation Age of Onset  . Atrial fibrillation Mother     irregular heart beats  . Hypertension Mother   . Diabetes type II Mother   . CAD Maternal Grandfather   . CAD Maternal Grandmother     Review of Systems:  As stated in the HPI and otherwise negative.   BP 136/78 mmHg  Pulse 50  Ht  (1.753 m)  Wt 205 lb (92.987 kg)  BMI 30.26 kg/m2  SpO2 97%  Physical Examination: General: Well developed, well nourished, NAD HEENT: OP clear, mucus membranes moist SKIN: warm, dry. No rashes. Neuro: No focal deficits Musculoskeletal: Muscle strength 5/5 all ext Psychiatric: Mood and affect normal Neck: No JVD, no carotid bruits, no thyromegaly, no lymphadenopathy. Lungs:Clear bilaterally, no wheezes, rhonci, crackles Cardiovascular: Regular rate and rhythm. No murmurs, gallops or rubs. Abdomen:Soft. Bowel sounds present. Non-tender.  Extremities: No lower extremity edema. Pulses are 2 + in the bilateral DP/PT.  Cardiac cath 10/04/12:  Left mainstem: Widely patent with no obstructive disease  Left anterior descending (LAD): This is a large vessel that wraps around the left ventricular apex. There is no obstructive disease throughout the course of the LAD. The stent is difficult to visualize but I do not appreciate any in-stent restenosis. There are minor irregularities throughout.  Left circumflex (LCx): The left circumflex is patent. The first obtuse marginal was patent. The AV groove circumflex is patent down to the mid vessel with minor irregularity. The mid circumflex extending into an obtuse marginal branch has a 50% stenosis. The second OM branch is large in caliber.  Right coronary artery (RCA): There is diffuse disease present. The proximal RCA has severe 95-99% stenosis. The mid RCA has diffuse  75% stenosis. The distal vessel has diffuse irregularity. The PDA and PLA branches are patent with minor irregularities.  Left ventriculography: Left ventricular systolic function is moderately depressed. There is inferior wall hypokinesis from the base to the apex. The left ventricular ejection fraction is estimated at 45%.  PCI Note: Following the diagnostic procedure, the decision was made to proceed with PCI. The radial sheath was upsized to a 6 [place]. Weight-based bivalirudin was given for anticoagulation. The patient was given Plavix 600 mg on the table. Once a therapeutic ACT was achieved, a  6 [place] AL-1 guide catheter was inserted. A BMW coronary guidewire was used to cross the lesion. The lesion was predilated with a 2.5 mm balloon. The lesion was then stented with a 3.0 x 28 mm Promus premier drug-eluting stent. The stent was postdilated with a 3.25 mm noncompliant balloon to 16 atmospheres. Following PCI, there was 0% residual stenosis and TIMI-3 flow. Final angiography confirmed an excellent result. The patient tolerated the procedure well. There were no immediate procedural complications. A TR band was used for radial hemostasis. The patient was transferred to the post catheterization recovery area for further monitoring.  PCI Data:  Vessel - RCA/Segment - proximal  Percent Stenosis (pre) 99  TIMI-flow 3  Stent 3.0 x 28 mm Promus premier drug-eluting  Percent Stenosis (post) 0  TIMI-flow (post) 3  Final Conclusions:  1. Severe diffuse proximal and mid RCA stenosis treated successfully with PCI  2. Continued patency of the stented segment in the LAD  3. Moderate segmental LV systolic dysfunction   Stress myoview: 06/24/13: Stress Procedure: The patient exercised on the treadmill utilizing the Bruce Protocol for 9:30 minutes. The patient stopped due to fatigue and denied any chest pain. Technetium 31m Sestamibi was injected at peak exercise and myocardial perfusion imaging was performed  after a brief delay.  Stress ECG: No significant change from baseline ECG  QPS  Raw Data Images: Normal; no motion artifact; normal heart/lung ratio.  Stress Images: Large, severe basal to mid anterior/anteroseptal, apical inferior and apical lateral, and true apex perfusion defect.  Rest Images: Large, severe basal to mid anterior/anteroseptal, apical inferior and apical lateral, and true apex perfusion defect.  Subtraction (SDS): Fixed large, severe basal to mid anterior/anteroseptal, apical inferior and apical lateral, and true apex perfusion defect.  Transient Ischemic Dilatation (Normal <1.22): 0.97  Lung/Heart Ratio (Normal <0.45): 0.38  Quantitative Gated Spect Images  QGS EDV: 190 ml  QGS ESV: 125 ml  Impression  Exercise Capacity: Good exercise capacity.  BP Response: Hypertensive blood pressure response.  Clinical Symptoms: Fatigue.  ECG Impression: NSR with old anterior MI, no change with exercise.  Comparison with Prior Nuclear Study: No images to compare  Overall Impression: Intermediate risk stress nuclear study with a large, severe basal to mid anterior/anteroseptal, apical inferior and apical lateral, and true apex perfusion defect. This suggests infarction without significant ischemia.  LV Ejection Fraction: 34%. LV Wall Motion: Periapical severe hypokinesis.   EKG:  EKG is ordered today. The ekg ordered today demonstrates Sinus brady, rate 50 bpm. Poor R wave progressin c/w prior infarct. Diffuse T wave flattening, unchanged.   Recent Labs: No results found for requested labs within last 365 days.   Lipid Panel Followed in primary care   Wt Readings from Last 3 Encounters:  08/16/15 205 lb (92.987 kg)  08/10/14 209 lb 3.2 oz (94.892 kg)  07/28/13 212 lb (96.163 kg)     Other studies Reviewed: Additional studies/ records that were reviewed today include: . Review of the above records demonstrates:   Assessment and Plan:   1. CAD: He has no chest pain. There  is mostly diaphoresis and fatigue. I do not believe his symptoms are c/w unstable angina. Stress myoview 06/24/13 without ischemia. Continue medical management with ASA, statin, Norvasc.  No beta blocker with bradycardia. Will add Imdur 30 mg daily. I will see him back in one month and if no resolution of symptoms, will consider cath.   2. HTN: BP controlled. No changes today.   3.  HLD: Lipids well controlled in primary care. Continue statin.   4. Ischemic cardiomyopathy: LVEF=34% by [facility] February 2015. He does tolerate Ace-inh, ARB or beta blockers.  He does not wish to start even low dose of Ace-inh or ARB again.   Current medicines are reviewed at length with the patient today.  The patient does not have concerns regarding medicines.  The following changes have been made:  no change  Labs/ tests ordered today include:  No orders of the defined types were placed in this encounter.    Disposition:   FU with me in 12 months  Signed, Verne Carrow, MD 08/16/2015 2:39 PM    [facility] [address] [place], [place], [place]  [address] Phone: [phone]; Fax: [phone]

## 2015-09-13 ENCOUNTER — Other Ambulatory Visit: Payer: Self-pay | Admitting: Cardiovascular Disease

## 2015-09-20 DIAGNOSIS — G4726 Circadian rhythm sleep disorder, shift work type: Secondary | ICD-10-CM | POA: Diagnosis not present

## 2015-09-20 DIAGNOSIS — G4733 Obstructive sleep apnea (adult) (pediatric): Secondary | ICD-10-CM | POA: Diagnosis not present

## 2015-09-24 ENCOUNTER — Ambulatory Visit: Payer: Federal, State, Local not specified - PPO | Admitting: Cardiovascular Disease

## 2015-10-05 ENCOUNTER — Ambulatory Visit: Payer: Federal, State, Local not specified - PPO | Admitting: Cardiovascular Disease

## 2015-10-18 ENCOUNTER — Other Ambulatory Visit: Payer: Self-pay | Admitting: Cardiovascular Disease

## 2015-10-22 ENCOUNTER — Other Ambulatory Visit: Payer: Self-pay | Admitting: Cardiovascular Disease

## 2015-10-22 ENCOUNTER — Ambulatory Visit: Payer: Federal, State, Local not specified - PPO | Admitting: Cardiovascular Disease

## 2015-11-12 ENCOUNTER — Other Ambulatory Visit: Payer: Self-pay | Admitting: Cardiovascular Disease

## 2015-12-08 DIAGNOSIS — E7800 Pure hypercholesterolemia, unspecified: Secondary | ICD-10-CM | POA: Diagnosis not present

## 2015-12-08 DIAGNOSIS — M545 Low back pain: Secondary | ICD-10-CM | POA: Diagnosis not present

## 2015-12-08 DIAGNOSIS — I10 Essential (primary) hypertension: Secondary | ICD-10-CM | POA: Diagnosis not present

## 2015-12-08 DIAGNOSIS — F329 Major depressive disorder, single episode, unspecified: Secondary | ICD-10-CM | POA: Diagnosis not present

## 2015-12-13 ENCOUNTER — Encounter: Payer: Self-pay | Admitting: Cardiovascular Disease

## 2016-01-18 DIAGNOSIS — D649 Anemia, unspecified: Secondary | ICD-10-CM | POA: Diagnosis not present

## 2016-01-18 DIAGNOSIS — D4989 Neoplasm of unspecified behavior of other specified sites: Secondary | ICD-10-CM | POA: Diagnosis not present

## 2016-01-20 NOTE — Progress Notes (Signed)
Chief Complaint  Patient presents with  . Coronary Artery Disease    follow up     History of Present Illness: 55 yo male with history of CAD, ischemic cardiomyopathy, HTN, HLD who returns for follow up. He had a BMS placed in the LAD in 2008. He was admitted to [facility] October 04, 2012 for left heart cath which showed LAD stents patent, mid circumflex 50%, proximal RCA 95-99%, mid RCA 75%, EF 45% with inferior HK. PCI: Promus Premier DES to proximal RCA.  Last echo 2009: EF 35-40%, periapical AK, ant HK, mild LAE. He did not tolerate Ace-inhibitors due to cough. He is not on a beta blocker due to bradycardia. He did not tolerate ARB secondary to dizziness. I saw him 06/05/13 and he c/o SOB and sharp left sided chest pains as well as fatigue. I arranged a stress myoview on 06/24/13 which did not show ischemia, LVEF=34%.   He is here today for follow up.  He has no chest pain or SOB. Weight is stable. He has a much better work situation. He is working out now with [employer].   Primary Care Physician: Lupe Carney, MD   Past Medical History:  Diagnosis Date  . Allergic rhinitis   . Anxiety   . Arthritis    "lower back, right thumb, knees" (10/04/2012)  . Asthma    "grew out of it" (10/04/2012)  . Coronary artery disease    a. s/p prior stent to LAD;  b. LHC 6/14: LAD stent patent, mCFX 50%, pRCA 95-99%, mRCA 75%, EF 45% with inf HK => PCI: Promus DES to pRCA  . Depression   . Hyperlipidemia   . Hypertension   . MI (myocardial infarction) (HCC) 03/2007  . OSA (obstructive sleep apnea)    mild  . Pneumonia    "as a child" (10/04/2012)    Past Surgical History:  Procedure Laterality Date  . ARTHROPLASTY Right 1993   'crushed; removed bone fragments" (10/04/2012)  . CARDIAC CATHETERIZATION  2010  . CORONARY ANGIOPLASTY WITH STENT PLACEMENT  2008; 10/04/2012   "1 + 1" (10/04/2012)  . EXPLORATORY LAPAROTOMY  1990's?   "went in to repair hernia; found fatty tissue instead; no hernia repair" (10/04/2012)   . LEFT HEART CATHETERIZATION WITH CORONARY ANGIOGRAM N/A 10/04/2012   Procedure: LEFT HEART CATHETERIZATION WITH CORONARY ANGIOGRAM;  Surgeon: Tonny Bollman, MD;  Location: [facility] CATH LAB;  Service: Cardiovascular;  Laterality: N/A;    Current Outpatient Prescriptions  Medication Sig Dispense Refill  . aliskiren (TEKTURNA) 300 MG tablet Take 300 mg by mouth daily.     Marland Kitchen amLODipine (NORVASC) 10 MG tablet TAKE 1 TABLET (10 MG TOTAL) BY MOUTH DAILY. 90 tablet 2  . aspirin 81 MG tablet Take 81 mg by mouth daily.     . citalopram (CELEXA) 20 MG tablet Take 20 mg by mouth daily.     . clopidogrel (PLAVIX) 75 MG tablet TAKE 1 TABLET BY MOUTH EVERY DAY WITH BREAKFAST 90 tablet 2  . etodolac (LODINE) 400 MG tablet Take 1 tablet by mouth 2 (two) times daily as needed. For  pain  0  . hydrochlorothiazide (HYDRODIURIL) 25 MG tablet TAKE 1 TABLET (25 MG TOTAL) BY MOUTH DAILY. 30 tablet 10  . HYDROcodone-acetaminophen (NORCO/VICODIN) 5-325 MG per tablet Take 1 tablet by mouth every 4 (four) hours as needed for pain. 15 tablet 0  . isosorbide mononitrate (IMDUR) 30 MG 24 hr tablet Take 1 tablet (30 mg total) by mouth daily.  30 tablet 11  . NITROSTAT 0.4 MG SL tablet PLACE 1 TABLET UNDER THE TONGUE EVERY 5 MINUTES AS NEEDED FOR CHEST PAIN. 25 tablet 3  . pravastatin (PRAVACHOL) 80 MG tablet Take 80 mg by mouth daily.     No current facility-administered medications for this visit.     No Known Allergies  Social History   Social History  . Marital status: Married    Spouse name: N/A  . Number of children: 1  . Years of education: N/A   Occupational History  .  [employer]   Social History Main Topics  . Smoking status: Former Smoker    Packs/day: 0.50    Years: 20.00    Types: Cigarettes    Quit date: 12/31/1998  . Smokeless tobacco: Never Used  . Alcohol use 0.0 oz/week     Comment: 10/04/2012 "1 beer plus 2 mixed drinks once/month"  . Drug use: No  . Sexual activity: Yes   Other Topics  Concern  . Not on file   Social History Narrative  . No narrative on file    Family History  Problem Relation Age of Onset  . Atrial fibrillation Mother     irregular heart beats  . Hypertension Mother   . Diabetes type II Mother   . CAD Maternal Grandfather   . CAD Maternal Grandmother     Review of Systems:  As stated in the HPI and otherwise negative.   BP 120/80   Pulse (!) 50   Ht 5\' 9"  (1.753 m)   Wt 92.3 kg (203 lb 6.4 oz)   BMI 30.04 kg/m   Physical Examination: General: Well developed, well nourished, NAD  HEENT: OP clear, mucus membranes moist  SKIN: warm, dry. No rashes. Neuro: No focal deficits  Musculoskeletal: Muscle strength 5/5 all ext  Psychiatric: Mood and affect normal  Neck: No JVD, no carotid bruits, no thyromegaly, no lymphadenopathy.  Lungs:Clear bilaterally, no wheezes, rhonci, crackles Cardiovascular: Regular rate and rhythm. No murmurs, gallops or rubs. Abdomen:Soft. Bowel sounds present. Non-tender.  Extremities: No lower extremity edema. Pulses are 2 + in the bilateral DP/PT.  Cardiac cath 10/04/12:  Left mainstem: Widely patent with no obstructive disease  Left anterior descending (LAD): This is a large vessel that wraps around the left ventricular apex. There is no obstructive disease throughout the course of the LAD. The stent is difficult to visualize but I do not appreciate any in-stent restenosis. There are minor irregularities throughout.  Left circumflex (LCx): The left circumflex is patent. The first obtuse marginal was patent. The AV groove circumflex is patent down to the mid vessel with minor irregularity. The mid circumflex extending into an obtuse marginal branch has a 50% stenosis. The second OM branch is large in caliber.  Right coronary artery (RCA): There is diffuse disease present. The proximal RCA has severe 95-99% stenosis. The mid RCA has diffuse 75% stenosis. The distal vessel has diffuse irregularity. The PDA and PLA  branches are patent with minor irregularities.  Left ventriculography: Left ventricular systolic function is moderately depressed. There is inferior wall hypokinesis from the base to the apex. The left ventricular ejection fraction is estimated at 45%.  PCI Note: Following the diagnostic procedure, the decision was made to proceed with PCI. The radial sheath was upsized to a 6 [place]. Weight-based bivalirudin was given for anticoagulation. The patient was given Plavix 600 mg on the table. Once a therapeutic ACT was achieved, a 6 [place] AL-1 guide catheter  was inserted. A BMW coronary guidewire was used to cross the lesion. The lesion was predilated with a 2.5 mm balloon. The lesion was then stented with a 3.0 x 28 mm Promus premier drug-eluting stent. The stent was postdilated with a 3.25 mm noncompliant balloon to 16 atmospheres. Following PCI, there was 0% residual stenosis and TIMI-3 flow. Final angiography confirmed an excellent result. The patient tolerated the procedure well. There were no immediate procedural complications. A TR band was used for radial hemostasis. The patient was transferred to the post catheterization recovery area for further monitoring.  PCI Data:  Vessel - RCA/Segment - proximal  Percent Stenosis (pre) 99  TIMI-flow 3  Stent 3.0 x 28 mm Promus premier drug-eluting  Percent Stenosis (post) 0  TIMI-flow (post) 3  Final Conclusions:  1. Severe diffuse proximal and mid RCA stenosis treated successfully with PCI  2. Continued patency of the stented segment in the LAD  3. Moderate segmental LV systolic dysfunction   Stress myoview: 06/24/13: Stress Procedure: The patient exercised on the treadmill utilizing the Bruce Protocol for 9:30 minutes. The patient stopped due to fatigue and denied any chest pain. Technetium 57m Sestamibi was injected at peak exercise and myocardial perfusion imaging was performed after a brief delay.  Stress ECG: No significant change from baseline ECG    QPS  Raw Data Images: Normal; no motion artifact; normal heart/lung ratio.  Stress Images: Large, severe basal to mid anterior/anteroseptal, apical inferior and apical lateral, and true apex perfusion defect.  Rest Images: Large, severe basal to mid anterior/anteroseptal, apical inferior and apical lateral, and true apex perfusion defect.  Subtraction (SDS): Fixed large, severe basal to mid anterior/anteroseptal, apical inferior and apical lateral, and true apex perfusion defect.  Transient Ischemic Dilatation (Normal <1.22): 0.97  Lung/Heart Ratio (Normal <0.45): 0.38  Quantitative Gated Spect Images  QGS EDV: 190 ml  QGS ESV: 125 ml  Impression  Exercise Capacity: Good exercise capacity.  BP Response: Hypertensive blood pressure response.  Clinical Symptoms: Fatigue.  ECG Impression: NSR with old anterior MI, no change with exercise.  Comparison with Prior Nuclear Study: No images to compare  Overall Impression: Intermediate risk stress nuclear study with a large, severe basal to mid anterior/anteroseptal, apical inferior and apical lateral, and true apex perfusion defect. This suggests infarction without significant ischemia.  LV Ejection Fraction: 34%. LV Wall Motion: Periapical severe hypokinesis.   EKG:  EKG is not ordered today. The ekg ordered today demonstrates    Recent Labs: No results found for requested labs within last 8760 hours.   Lipid Panel Followed in primary care   Wt Readings from Last 3 Encounters:  01/21/16 92.3 kg (203 lb 6.4 oz)  08/16/15 93 kg (205 lb)  08/10/14 94.9 kg (209 lb 3.2 oz)     Other studies Reviewed: Additional studies/ records that were reviewed today include: . Review of the above records demonstrates:   Assessment and Plan:   1. CAD: He has no chest pain that is suggestive of angina. His fatigue resolved with changes in his workplace. His stress level is much lower. Stress myoview 06/24/13 without ischemia. Continue medical management  with ASA, statin, Norvasc, Imdur.  No beta blocker with bradycardia.   2. HTN: BP controlled. No changes today.   3. HLD: Lipids well controlled in primary care. Continue statin.   4. Ischemic cardiomyopathy: LVEF=34% by [facility] February 2015. He does tolerate Ace-inh, ARB or beta blockers.  He does not wish to start even low  dose of Ace-inh or ARB again.   Current medicines are reviewed at length with the patient today.  The patient does not have concerns regarding medicines.  The following changes have been made:  no change  Labs/ tests ordered today include:  No orders of the defined types were placed in this encounter.   Disposition:   FU with me in 12 months  Signed, Verne Carrow, MD 01/21/2016 9:25 AM    [facility] [address] [place], [place], [place]  [address] Phone: [phone]; Fax: [phone]

## 2016-01-21 ENCOUNTER — Encounter: Payer: Self-pay | Admitting: Cardiovascular Disease

## 2016-01-21 ENCOUNTER — Ambulatory Visit (INDEPENDENT_AMBULATORY_CARE_PROVIDER_SITE_OTHER): Payer: Federal, State, Local not specified - PPO | Admitting: Cardiovascular Disease

## 2016-01-21 ENCOUNTER — Encounter (INDEPENDENT_AMBULATORY_CARE_PROVIDER_SITE_OTHER): Payer: Self-pay

## 2016-01-21 VITALS — BP 120/80 | HR 50 | Ht 69.0 in | Wt 203.4 lb

## 2016-01-21 DIAGNOSIS — I10 Essential (primary) hypertension: Secondary | ICD-10-CM | POA: Diagnosis not present

## 2016-01-21 DIAGNOSIS — I2510 Atherosclerotic heart disease of native coronary artery without angina pectoris: Secondary | ICD-10-CM | POA: Diagnosis not present

## 2016-01-21 DIAGNOSIS — I255 Ischemic cardiomyopathy: Secondary | ICD-10-CM

## 2016-01-21 DIAGNOSIS — E785 Hyperlipidemia, unspecified: Secondary | ICD-10-CM | POA: Diagnosis not present

## 2016-01-21 NOTE — Patient Instructions (Signed)

## 2016-06-09 DIAGNOSIS — E7800 Pure hypercholesterolemia, unspecified: Secondary | ICD-10-CM | POA: Diagnosis not present

## 2016-06-09 DIAGNOSIS — I10 Essential (primary) hypertension: Secondary | ICD-10-CM | POA: Diagnosis not present

## 2016-06-09 DIAGNOSIS — Z0000 Encounter for general adult medical examination without abnormal findings: Secondary | ICD-10-CM | POA: Diagnosis not present

## 2016-06-09 DIAGNOSIS — Z125 Encounter for screening for malignant neoplasm of prostate: Secondary | ICD-10-CM | POA: Diagnosis not present

## 2016-08-29 DIAGNOSIS — Z0100 Encounter for examination of eyes and vision without abnormal findings: Secondary | ICD-10-CM | POA: Diagnosis not present

## 2016-09-05 DIAGNOSIS — Z716 Tobacco abuse counseling: Secondary | ICD-10-CM | POA: Diagnosis not present

## 2016-09-05 DIAGNOSIS — Z0000 Encounter for general adult medical examination without abnormal findings: Secondary | ICD-10-CM | POA: Diagnosis not present

## 2016-09-05 DIAGNOSIS — Z720 Tobacco use: Secondary | ICD-10-CM | POA: Diagnosis not present

## 2016-09-05 DIAGNOSIS — I2510 Atherosclerotic heart disease of native coronary artery without angina pectoris: Secondary | ICD-10-CM | POA: Diagnosis not present

## 2016-09-05 DIAGNOSIS — I10 Essential (primary) hypertension: Secondary | ICD-10-CM | POA: Diagnosis not present

## 2016-09-14 DIAGNOSIS — Z1211 Encounter for screening for malignant neoplasm of colon: Secondary | ICD-10-CM | POA: Diagnosis not present

## 2016-09-28 ENCOUNTER — Other Ambulatory Visit: Payer: Self-pay | Admitting: Cardiovascular Disease

## 2016-10-03 DIAGNOSIS — H2513 Age-related nuclear cataract, bilateral: Secondary | ICD-10-CM | POA: Diagnosis not present

## 2016-10-03 DIAGNOSIS — H524 Presbyopia: Secondary | ICD-10-CM | POA: Diagnosis not present

## 2016-10-06 DIAGNOSIS — R109 Unspecified abdominal pain: Secondary | ICD-10-CM | POA: Diagnosis not present

## 2016-10-06 DIAGNOSIS — R3911 Hesitancy of micturition: Secondary | ICD-10-CM | POA: Diagnosis not present

## 2016-10-25 DIAGNOSIS — H524 Presbyopia: Secondary | ICD-10-CM | POA: Diagnosis not present

## 2016-10-25 DIAGNOSIS — H53149 Visual discomfort, unspecified: Secondary | ICD-10-CM | POA: Diagnosis not present

## 2016-12-26 DIAGNOSIS — F329 Major depressive disorder, single episode, unspecified: Secondary | ICD-10-CM | POA: Diagnosis not present

## 2016-12-26 DIAGNOSIS — I10 Essential (primary) hypertension: Secondary | ICD-10-CM | POA: Diagnosis not present

## 2016-12-26 DIAGNOSIS — M545 Low back pain: Secondary | ICD-10-CM | POA: Diagnosis not present

## 2016-12-26 DIAGNOSIS — E7800 Pure hypercholesterolemia, unspecified: Secondary | ICD-10-CM | POA: Diagnosis not present

## 2017-01-24 ENCOUNTER — Encounter: Payer: Self-pay | Admitting: Cardiovascular Disease

## 2017-01-31 NOTE — Progress Notes (Signed)
Chief Complaint  Patient presents with  . Follow-up    CAD     History of Present Illness: 56 yo male with history of CAD, ischemic cardiomyopathy, HTN, HLD who is here today for cardiac follow up. He had a bare metal stent placed in the LAD in 2008. Last cardiac cath in June 2014 and he had patent LAD stents, 50% mid Circumflex stenosis and severe disease in the RCA. A drug eluting stent was placed in the proximal RCA. Last echo 2009 with LVEF=35-40%, periapical AK, ant HK, mild LAE. He did not tolerate Ace-inhibitors due to cough. He is not on a beta blocker due to bradycardia. He did not tolerate ARB secondary to dizziness. I saw him 06/05/13 and he c/o SOB and sharp left sided chest pains as well as fatigue. I arranged a stress myoview on 06/24/13 which did not show ischemia, LVEF=34%.   He is here today for follow up. The patient denies any lower extremity edema, orthopnea, PND, dizziness, near syncope or syncope. He has occasional sharp chest pains. He is having more dyspnea with exertion. No weight gain. Occasional palpitations. Stress at home as his wife has been diagnosed with myeloma.   Primary Care Physician: Clovis Riley, L.[name], MD   Past Medical History:  Diagnosis Date  . Allergic rhinitis   . Anxiety   . Arthritis    "lower back, right thumb, knees" (10/04/2012)  . Asthma    "grew out of it" (10/04/2012)  . Coronary artery disease    a. s/p prior stent to LAD;  b. LHC 6/14: LAD stent patent, mCFX 50%, pRCA 95-99%, mRCA 75%, EF 45% with inf HK => PCI: Promus DES to pRCA  . Depression   . Hyperlipidemia   . Hypertension   . MI (myocardial infarction) (HCC) 03/2007  . OSA (obstructive sleep apnea)    mild  . Pneumonia    "as a child" (10/04/2012)    Past Surgical History:  Procedure Laterality Date  . ARTHROPLASTY Right 1993   'crushed; removed bone fragments" (10/04/2012)  . CARDIAC CATHETERIZATION  2010  . CORONARY ANGIOPLASTY WITH STENT PLACEMENT  2008; 10/04/2012   "1 +  1" (10/04/2012)  . EXPLORATORY LAPAROTOMY  1990's?   "went in to repair hernia; found fatty tissue instead; no hernia repair" (10/04/2012)  . LEFT HEART CATHETERIZATION WITH CORONARY ANGIOGRAM N/A 10/04/2012   Procedure: LEFT HEART CATHETERIZATION WITH CORONARY ANGIOGRAM;  Surgeon: Tonny Bollman, MD;  Location: [facility] CATH LAB;  Service: Cardiovascular;  Laterality: N/A;    Current Outpatient Prescriptions  Medication Sig Dispense Refill  . aliskiren (TEKTURNA) 300 MG tablet Take 300 mg by mouth daily.     Marland Kitchen amLODipine (NORVASC) 10 MG tablet TAKE 1 TABLET (10 MG TOTAL) BY MOUTH DAILY. 90 tablet 2  . aspirin 81 MG tablet Take 81 mg by mouth daily.     . citalopram (CELEXA) 20 MG tablet Take 20 mg by mouth daily.     . clopidogrel (PLAVIX) 75 MG tablet TAKE 1 TABLET BY MOUTH EVERY DAY WITH BREAKFAST 90 tablet 2  . etodolac (LODINE) 400 MG tablet Take 1 tablet by mouth 2 (two) times daily as needed. For  pain  0  . hydrochlorothiazide (HYDRODIURIL) 25 MG tablet TAKE 1 TABLET (25 MG TOTAL) BY MOUTH DAILY. 30 tablet 10  . HYDROcodone-acetaminophen (NORCO/VICODIN) 5-325 MG per tablet Take 1 tablet by mouth every 4 (four) hours as needed for pain. 15 tablet 0  . isosorbide mononitrate (IMDUR) 30  MG 24 hr tablet Take 1 tablet (30 mg total) by mouth daily. 30 tablet 11  . nitroGLYCERIN (NITROSTAT) 0.4 MG SL tablet PLACE 1 TABLET UNDER THE TONGUE EVERY 5 MINUTES AS NEEDED FOR CHEST PAIN. 25 tablet 1  . pravastatin (PRAVACHOL) 80 MG tablet Take 80 mg by mouth daily.    . tamsulosin (FLOMAX) 0.4 MG CAPS capsule Take 1 capsule by mouth daily.     No current facility-administered medications for this visit.     No Known Allergies  Social History   Social History  . Marital status: Married    Spouse name: N/A  . Number of children: 1  . Years of education: N/A   Occupational History  .  [employer]   Social History Main Topics  . Smoking status: Former Smoker    Packs/day: 0.50    Years: 20.00     Types: Cigarettes    Quit date: 12/31/1998  . Smokeless tobacco: Never Used  . Alcohol use 0.0 oz/week     Comment: 10/04/2012 "1 beer plus 2 mixed drinks once/month"  . Drug use: No  . Sexual activity: Yes   Other Topics Concern  . Not on file   Social History Narrative  . No narrative on file    Family History  Problem Relation Age of Onset  . Atrial fibrillation Mother        irregular heart beats  . Hypertension Mother   . Diabetes type II Mother   . CAD Maternal Grandfather   . CAD Maternal Grandmother     Review of Systems:  As stated in the HPI and otherwise negative.   BP 118/70   Pulse (!) 50   Ht  (1.753 m)   Wt 201 lb (91.2 kg)   SpO2 96%   BMI 29.68 kg/m   Physical Examination: General: Well developed, well nourished, NAD  HEENT: OP clear, mucus membranes moist  SKIN: warm, dry. No rashes. Neuro: No focal deficits  Musculoskeletal: Muscle strength 5/5 all ext  Psychiatric: Mood and affect normal  Neck: No JVD, no carotid bruits, no thyromegaly, no lymphadenopathy.  Lungs:Clear bilaterally, no wheezes, rhonci, crackles Cardiovascular: Regular rate and rhythm. No murmurs, gallops or rubs. Abdomen:Soft. Bowel sounds present. Non-tender.  Extremities: No lower extremity edema. Pulses are 2 + in the bilateral DP/PT.  Cardiac cath 10/04/12:  Left mainstem: Widely patent with no obstructive disease  Left anterior descending (LAD): This is a large vessel that wraps around the left ventricular apex. There is no obstructive disease throughout the course of the LAD. The stent is difficult to visualize but I do not appreciate any in-stent restenosis. There are minor irregularities throughout.  Left circumflex (LCx): The left circumflex is patent. The first obtuse marginal was patent. The AV groove circumflex is patent down to the mid vessel with minor irregularity. The mid circumflex extending into an obtuse marginal branch has a 50% stenosis. The second OM branch  is large in caliber.  Right coronary artery (RCA): There is diffuse disease present. The proximal RCA has severe 95-99% stenosis. The mid RCA has diffuse 75% stenosis. The distal vessel has diffuse irregularity. The PDA and PLA branches are patent with minor irregularities.  Left ventriculography: Left ventricular systolic function is moderately depressed. There is inferior wall hypokinesis from the base to the apex. The left ventricular ejection fraction is estimated at 45%.  PCI Note: Following the diagnostic procedure, the decision was made to proceed with PCI. The radial  sheath was upsized to a 6 [place]. Weight-based bivalirudin was given for anticoagulation. The patient was given Plavix 600 mg on the table. Once a therapeutic ACT was achieved, a 6 [place] AL-1 guide catheter was inserted. A BMW coronary guidewire was used to cross the lesion. The lesion was predilated with a 2.5 mm balloon. The lesion was then stented with a 3.0 x 28 mm Promus premier drug-eluting stent. The stent was postdilated with a 3.25 mm noncompliant balloon to 16 atmospheres. Following PCI, there was 0% residual stenosis and TIMI-3 flow. Final angiography confirmed an excellent result. The patient tolerated the procedure well. There were no immediate procedural complications. A TR band was used for radial hemostasis. The patient was transferred to the post catheterization recovery area for further monitoring.  PCI Data:  Vessel - RCA/Segment - proximal  Percent Stenosis (pre) 99  TIMI-flow 3  Stent 3.0 x 28 mm Promus premier drug-eluting  Percent Stenosis (post) 0  TIMI-flow (post) 3  Final Conclusions:  1. Severe diffuse proximal and mid RCA stenosis treated successfully with PCI  2. Continued patency of the stented segment in the LAD  3. Moderate segmental LV systolic dysfunction   Stress myoview: 06/24/13: Stress Procedure: The patient exercised on the treadmill utilizing the Bruce Protocol for 9:30 minutes. The  patient stopped due to fatigue and denied any chest pain. Technetium 32m Sestamibi was injected at peak exercise and myocardial perfusion imaging was performed after a brief delay.  Stress ECG: No significant change from baseline ECG  QPS  Raw Data Images: Normal; no motion artifact; normal heart/lung ratio.  Stress Images: Large, severe basal to mid anterior/anteroseptal, apical inferior and apical lateral, and true apex perfusion defect.  Rest Images: Large, severe basal to mid anterior/anteroseptal, apical inferior and apical lateral, and true apex perfusion defect.  Subtraction (SDS): Fixed large, severe basal to mid anterior/anteroseptal, apical inferior and apical lateral, and true apex perfusion defect.  Transient Ischemic Dilatation (Normal <1.22): 0.97  Lung/Heart Ratio (Normal <0.45): 0.38  Quantitative Gated Spect Images  QGS EDV: 190 ml  QGS ESV: 125 ml  Impression  Exercise Capacity: Good exercise capacity.  BP Response: Hypertensive blood pressure response.  Clinical Symptoms: Fatigue.  ECG Impression: NSR with old anterior MI, no change with exercise.  Comparison with Prior Nuclear Study: No images to compare  Overall Impression: Intermediate risk stress nuclear study with a large, severe basal to mid anterior/anteroseptal, apical inferior and apical lateral, and true apex perfusion defect. This suggests infarction without significant ischemia.  LV Ejection Fraction: 34%. LV Wall Motion: Periapical severe hypokinesis.   EKG:  EKG is  ordered today. The ekg ordered today demonstrates Sinus bradycardia 1st degree AV block. Rate 50 bpm.  Chronic T wave inversions anterolateral and lateral leads.   Recent Labs: No results found for requested labs within last 8760 hours.   Lipid Panel Followed in primary care   Wt Readings from Last 3 Encounters:  02/01/17 201 lb (91.2 kg)  01/21/16 203 lb 6.4 oz (92.3 kg)  08/16/15 205 lb (93 kg)     Other studies Reviewed: Additional  studies/ records that were reviewed today include: . Review of the above records demonstrates:   Assessment and Plan:   1. CAD without angina: He is having no chest pain suggestive of angina but his having dyspnea. This may be his anginal equivalent. Will arrange exercise nuclear stress test. Will continue ASA, statin, Imdur and Norvasc. He does not tolerate beta blockers due to  bradycardia.    2. HTN: BP controlled. No changes.   3. HLD: Lipids controlled in primary care. Continue statin.   4. Ischemic cardiomyopathy: Last LVEF 34% by nuclear stress test in 2015. Will arrange echo now to reassess LV systolic function. He does not tolerate beta blockers, ARBs or Ace-inhs.   5. Dyspnea: Cannot exclude this an anginal equivalent. Will arrange exercise nuclear stress test.   Current medicines are reviewed at length with the patient today.  The patient does not have concerns regarding medicines.  The following changes have been made:  no change  Labs/ tests ordered today include:   Orders Placed This Encounter  Procedures  . Myocardial Perfusion Imaging  . EKG 12-Lead  . ECHOCARDIOGRAM COMPLETE    Disposition:   FU with me in 4-6 weeks. s  Signed, Verne Carrow, MD 02/01/2017 1:41 PM    [facility] [address] [place], [place], [place]  [address] Phone: [phone]; Fax: [phone]

## 2017-02-01 ENCOUNTER — Encounter (INDEPENDENT_AMBULATORY_CARE_PROVIDER_SITE_OTHER): Payer: Self-pay

## 2017-02-01 ENCOUNTER — Encounter: Payer: Self-pay | Admitting: Cardiovascular Disease

## 2017-02-01 ENCOUNTER — Ambulatory Visit (INDEPENDENT_AMBULATORY_CARE_PROVIDER_SITE_OTHER): Payer: Federal, State, Local not specified - PPO | Admitting: Cardiovascular Disease

## 2017-02-01 VITALS — BP 118/70 | HR 50 | Ht 69.0 in | Wt 201.0 lb

## 2017-02-01 DIAGNOSIS — E7800 Pure hypercholesterolemia, unspecified: Secondary | ICD-10-CM

## 2017-02-01 DIAGNOSIS — R0609 Other forms of dyspnea: Secondary | ICD-10-CM | POA: Diagnosis not present

## 2017-02-01 DIAGNOSIS — I255 Ischemic cardiomyopathy: Secondary | ICD-10-CM

## 2017-02-01 DIAGNOSIS — I10 Essential (primary) hypertension: Secondary | ICD-10-CM

## 2017-02-01 DIAGNOSIS — R0600 Dyspnea, unspecified: Secondary | ICD-10-CM

## 2017-02-01 DIAGNOSIS — I2510 Atherosclerotic heart disease of native coronary artery without angina pectoris: Secondary | ICD-10-CM

## 2017-02-01 NOTE — Patient Instructions (Signed)
Medication Instructions:  Your physician recommends that you continue on your current medications as directed. Please refer to the Current Medication list given to you today.   Labwork: none  Testing/Procedures: Your physician has requested that you have an echocardiogram. Echocardiography is a painless test that uses sound waves to create images of your heart. It provides your doctor with information about the size and shape of your heart and how well your heart's chambers and valves are working. This procedure takes approximately one hour. There are no restrictions for this procedure.  Your physician has requested that you have an exercise stress myoview. For further information please visit [email or web address] Please follow instruction sheet, as given.   Follow-Up: Your physician recommends that you schedule a follow-up appointment in: 4-6 weeks.  Scheduled for 03/09/17 at 10:40    Any Other Special Instructions Will Be Listed Below (If Applicable).     If you need a refill on your cardiac medications before your next appointment, please call your pharmacy.

## 2017-02-13 ENCOUNTER — Telehealth (HOSPITAL_COMMUNITY): Payer: Self-pay | Admitting: *Deleted

## 2017-02-13 NOTE — Telephone Encounter (Signed)
Left message on voicemail per DPR in reference to upcoming appointment scheduled on 02/16/17 at 0845 with detailed instructions given per Myocardial Perfusion Study Information Sheet for the test. LM to arrive 15 minutes early, and that it is imperative to arrive on time for appointment to keep from having the test rescheduled. If you need to cancel or reschedule your appointment, please call the office within 24 hours of your appointment. Failure to do so may result in a cancellation of your appointment, and a $50 no show fee. Phone number given for call back for any questions.

## 2017-02-16 ENCOUNTER — Ambulatory Visit (HOSPITAL_COMMUNITY): Payer: Federal, State, Local not specified - PPO | Attending: Internal Medicine

## 2017-02-16 ENCOUNTER — Ambulatory Visit (HOSPITAL_BASED_OUTPATIENT_CLINIC_OR_DEPARTMENT_OTHER): Payer: Federal, State, Local not specified - PPO

## 2017-02-16 ENCOUNTER — Other Ambulatory Visit: Payer: Self-pay

## 2017-02-16 DIAGNOSIS — G4733 Obstructive sleep apnea (adult) (pediatric): Secondary | ICD-10-CM | POA: Diagnosis not present

## 2017-02-16 DIAGNOSIS — I071 Rheumatic tricuspid insufficiency: Secondary | ICD-10-CM | POA: Diagnosis not present

## 2017-02-16 DIAGNOSIS — I119 Hypertensive heart disease without heart failure: Secondary | ICD-10-CM | POA: Insufficient documentation

## 2017-02-16 DIAGNOSIS — R0600 Dyspnea, unspecified: Secondary | ICD-10-CM

## 2017-02-16 DIAGNOSIS — I255 Ischemic cardiomyopathy: Secondary | ICD-10-CM

## 2017-02-16 DIAGNOSIS — I252 Old myocardial infarction: Secondary | ICD-10-CM | POA: Insufficient documentation

## 2017-02-16 DIAGNOSIS — E785 Hyperlipidemia, unspecified: Secondary | ICD-10-CM | POA: Diagnosis not present

## 2017-02-16 DIAGNOSIS — R0609 Other forms of dyspnea: Secondary | ICD-10-CM | POA: Diagnosis not present

## 2017-02-16 DIAGNOSIS — I2510 Atherosclerotic heart disease of native coronary artery without angina pectoris: Secondary | ICD-10-CM | POA: Insufficient documentation

## 2017-02-16 DIAGNOSIS — Z87891 Personal history of nicotine dependence: Secondary | ICD-10-CM | POA: Diagnosis not present

## 2017-02-16 DIAGNOSIS — R9439 Abnormal result of other cardiovascular function study: Secondary | ICD-10-CM | POA: Diagnosis not present

## 2017-02-16 LAB — MYOCARDIAL PERFUSION IMAGING
LV dias vol: 228 mL (ref 62–150)
LV sys vol: 151 mL
Peak HR: 118 {beats}/min
RATE: 0.35
Rest HR: 53 {beats}/min
SDS: 2
SRS: 21
SSS: 23
TID: 0.93

## 2017-02-16 MED ORDER — TECHNETIUM TC 99M TETROFOSMIN IV KIT
10.9000 | PACK | Freq: Once | INTRAVENOUS | Status: AC | PRN
  Administered 2017-02-16: 10.9 via INTRAVENOUS
  Filled 2017-02-16: qty 11

## 2017-02-16 MED ORDER — REGADENOSON 0.4 MG/5ML IV SOLN
0.4000 mg | Freq: Once | INTRAVENOUS | Status: AC
  Administered 2017-02-16: 0.4 mg via INTRAVENOUS

## 2017-02-16 MED ORDER — TECHNETIUM TC 99M TETROFOSMIN IV KIT
32.6000 | PACK | Freq: Once | INTRAVENOUS | Status: AC | PRN
  Administered 2017-02-16: 32.6 via INTRAVENOUS
  Filled 2017-02-16: qty 33

## 2017-02-16 MED ORDER — PERFLUTREN LIPID MICROSPHERE
1.0000 mL | INTRAVENOUS | Status: AC | PRN
  Administered 2017-02-16: 1 mL via INTRAVENOUS

## 2017-02-20 ENCOUNTER — Ambulatory Visit (INDEPENDENT_AMBULATORY_CARE_PROVIDER_SITE_OTHER): Payer: Federal, State, Local not specified - PPO | Admitting: Physician Assistant

## 2017-02-20 ENCOUNTER — Encounter: Payer: Self-pay | Admitting: Physician Assistant

## 2017-02-20 ENCOUNTER — Encounter: Payer: Self-pay | Admitting: *Deleted

## 2017-02-20 VITALS — BP 118/78 | HR 55 | Ht 69.0 in | Wt 205.5 lb

## 2017-02-20 DIAGNOSIS — I10 Essential (primary) hypertension: Secondary | ICD-10-CM | POA: Diagnosis not present

## 2017-02-20 DIAGNOSIS — I208 Other forms of angina pectoris: Secondary | ICD-10-CM | POA: Diagnosis not present

## 2017-02-20 DIAGNOSIS — I25118 Atherosclerotic heart disease of native coronary artery with other forms of angina pectoris: Secondary | ICD-10-CM

## 2017-02-20 DIAGNOSIS — I255 Ischemic cardiomyopathy: Secondary | ICD-10-CM

## 2017-02-20 DIAGNOSIS — E785 Hyperlipidemia, unspecified: Secondary | ICD-10-CM

## 2017-02-20 NOTE — Patient Instructions (Addendum)
Medication Instructions:   Your physician recommends that you continue on your current medications as directed. Please refer to the Current Medication list given to you today.    If you need a refill on your cardiac medications before your next appointment, please call your pharmacy.  Labwork: CBC BMET  PTT TODAY        Testing/Procedures: SEE LETTER FOR LEFT HEART CATH ON 02-26-2017   Follow 2 WEEKS POST CATH WITH LENZE    Any Other Special Instructions Will Be Listed Below (If Applicable).

## 2017-02-20 NOTE — Progress Notes (Signed)
Cardiology Office Note    Date:  02/20/2017   ID:  Austin Ingram, DOB 1960/07/05, MRN [id number]  PCP:  Clovis RileyMitchell, L.August Saucerean, MD  Cardiologist: Dr. Clifton JamesMcAlhany  Chief Complaint  Patient presents with  . Follow-up    History of Present Illness:  Austin Ingram is a 56 y.o. male with history of ischemic cardiomyopathy, CAD status post BMS to the LAD in 2008. Last cath in 2014 patent LAD stents and 50% mid circumflex stenosis with severe disease in the RCA. Had DES to the RCA. Last echo 2009 LVEF 35-40% with periapical akinesis and anterior hypokinesis and mild left atrial enlargement. Did not tolerate ACE inhibitor secondary to cough. Didn't tolerate ARB secondary to dizziness. Not on beta blocker secondary to bradycardia. Nuclear stress test 06/2013 no ischemia LVEF 34%.  Last saw Dr.McAlhany 02/01/17 was having some dyspnea on exertion so exercise nuclear stress test was ordered. Nuclear stress test 02/16/17 showed EF of 34% and was read as high risk with large prior anterior septal and apical infarct, mild ischemia in the basal inferior wall akinesis of the anterior septal wall and apex severe left ventricular enlargement. Dr.McAlhany recommended cardiac catheterization.2-D echo showed LVEF 35-40% with global hypokinesis. Grade 2 DD.  Patient comes in today accompanied by his wife and granddaughter. He says now that when he walks a short distance he does get chest tightness and shortness of breath. He says it's becoming more frequent with less exertion. If he does any repetitive work he has chest pain. Smokes marijuana daily, 2 joints.  Past Medical History:  Diagnosis Date  . Allergic rhinitis   . Anxiety   . Arthritis    "lower back, right thumb, knees" (10/04/2012)  . Asthma    "grew out of it" (10/04/2012)  . Coronary artery disease    a. s/p prior stent to LAD;  b. LHC 6/14: LAD stent patent, mCFX 50%, pRCA 95-99%, mRCA 75%, EF 45% with inf HK => PCI: Promus DES to pRCA  . Depression   .  Hyperlipidemia   . Hypertension   . MI (myocardial infarction) (HCC) 03/2007  . OSA (obstructive sleep apnea)    mild  . Pneumonia    "as a child" (10/04/2012)    Past Surgical History:  Procedure Laterality Date  . ARTHROPLASTY Right 1993   'crushed; removed bone fragments" (10/04/2012)  . CARDIAC CATHETERIZATION  2010  . CORONARY ANGIOPLASTY WITH STENT PLACEMENT  2008; 10/04/2012   "1 + 1" (10/04/2012)  . EXPLORATORY LAPAROTOMY  1990's?   "went in to repair hernia; found fatty tissue instead; no hernia repair" (10/04/2012)  . LEFT HEART CATHETERIZATION WITH CORONARY ANGIOGRAM N/A 10/04/2012   Procedure: LEFT HEART CATHETERIZATION WITH CORONARY ANGIOGRAM;  Surgeon: Tonny BollmanMichael Cooper, MD;  [facility];  Service: Cardiovascular;  Laterality: N/A;    Current Medications: Current Meds  Medication Sig  . aliskiren (TEKTURNA) 300 MG tablet Take 300 mg by mouth daily.   Marland Kitchen. amLODipine (NORVASC) 10 MG tablet TAKE 1 TABLET (10 MG TOTAL) BY MOUTH DAILY.  Marland Kitchen. aspirin 81 MG tablet Take 81 mg by mouth daily.   . citalopram (CELEXA) 20 MG tablet Take 20 mg by mouth daily.   . clopidogrel (PLAVIX) 75 MG tablet TAKE 1 TABLET BY MOUTH EVERY DAY WITH BREAKFAST  . etodolac (LODINE) 400 MG tablet Take 1 tablet by mouth 2 (two) times daily as needed. For  pain  . finasteride (PROSCAR) 5 MG tablet Take 5 mg by mouth daily.  .Marland Kitchen  hydrochlorothiazide (HYDRODIURIL) 25 MG tablet TAKE 1 TABLET (25 MG TOTAL) BY MOUTH DAILY.  Marland Kitchen HYDROcodone-acetaminophen (NORCO/VICODIN) 5-325 MG per tablet Take 1 tablet by mouth every 4 (four) hours as needed for pain.  . isosorbide mononitrate (IMDUR) 30 MG 24 hr tablet Take 1 tablet (30 mg total) by mouth daily.  . nitroGLYCERIN (NITROSTAT) 0.4 MG SL tablet PLACE 1 TABLET UNDER THE TONGUE EVERY 5 MINUTES AS NEEDED FOR CHEST PAIN.  Marland Kitchen pravastatin (PRAVACHOL) 80 MG tablet Take 80 mg by mouth daily.  . tamsulosin (FLOMAX) 0.4 MG CAPS capsule Take 1 capsule by mouth daily.      Allergies:   Patient has no known allergies.   Social History   Social History  . Marital status: Married    Spouse name: N/A  . Number of children: 1  . Years of education: N/A   Occupational History  .  [employer]   Social History Main Topics  . Smoking status: Former Smoker    Packs/day: 0.50    Years: 20.00    Types: Cigarettes    Quit date: 12/31/1998  . Smokeless tobacco: Never Used  . Alcohol use 0.0 oz/week     Comment: 10/04/2012 "1 beer plus 2 mixed drinks once/month"  . Drug use: No  . Sexual activity: Yes   Other Topics Concern  . None   Social History Narrative  . None     Family History:  The patient's family history includes Atrial fibrillation in his mother; CAD in his maternal grandfather and maternal grandmother; Diabetes type II in his mother; Hypertension in his mother.   ROS:   Please see the history of present illness.    Review of Systems  Constitution: Negative.  HENT: Negative.   Cardiovascular: Positive for chest pain and dyspnea on exertion.  Respiratory: Positive for snoring and wheezing.   Endocrine: Negative.   Hematologic/Lymphatic: Negative.   Musculoskeletal: Positive for back pain and myalgias.  Gastrointestinal: Negative.   Genitourinary: Positive for hesitancy.  Neurological: Positive for headaches.  Psychiatric/Behavioral: Positive for depression. The patient is nervous/anxious.    All other systems reviewed and are negative.   PHYSICAL EXAM:   VS:  BP 118/78   Pulse (!) 55   Ht 5\' 9"  (1.753 m)   Wt 205 lb 8 oz (93.2 kg)   BMI 30.35 kg/m   Physical Exam  GEN: Well nourished, well developed, in no acute distress  HEENT: normal  Neck: no JVD, carotid bruits, or masses Cardiac:RRR; Positive S4, 2/6 systolic murmur at the left sternal border Respiratory:  Decreased breath sounds with scattered crackles GI: soft, nontender, nondistended, + BS Ext: without cyanosis, clubbing, or edema, Good distal pulses  bilaterally MS: no deformity or atrophy  Skin: warm and dry, no rash Neuro:  Alert and Oriented x 3, Strength and sensation are intact Psych: euthymic mood, full affect  Wt Readings from Last 3 Encounters:  02/20/17 205 lb 8 oz (93.2 kg)  02/01/17 201 lb (91.2 kg)  01/21/16 203 lb 6.4 oz (92.3 kg)      Studies/Labs Reviewed:   EKG:  EKG is not ordered today.    Recent Labs: No results found for requested labs within last 8760 hours.   Lipid Panel    Component Value Date/Time   CHOL 136 09/03/2012 0951   TRIG 95.0 09/03/2012 0951   HDL 43.20 09/03/2012 0951   CHOLHDL 3 09/03/2012 0951   VLDL 19.0 09/03/2012 0951   LDLCALC 74 09/03/2012 0951  LDLDIRECT 59.9 06/03/2007 1114    Additional studies/ records that were reviewed today include:   Nuclear stress test 10/19/18Nuclear stress EF: 34%.  There was no ST segment deviation noted during stress.  Defect 1: There is a large defect of severe severity present in the basal anteroseptal, mid anteroseptal, apical anterior, apical septal, apical inferior, apical lateral and apex location.  Defect 2: There is a small defect of moderate severity present in the basal inferior location.  This is a high risk study.  Findings consistent with ischemia and prior myocardial infarction.  The left ventricular ejection fraction is moderately decreased (30-44%).   High risk stress nuclear study with large prior anteroseptal and apical infarct; mild ischemia in the basal inferior wall; EF 34 with akinesis of the anteroseptal wall and apex; severe LVE; study high risk due to reduced LV function.   2-D echo 10/19/18Study Conclusions   - Left ventricle: The cavity size was normal. There was mild focal   basal hypertrophy of the septum. Systolic function was moderately   reduced. The estimated ejection fraction was in the range of 35%   to 40%. Global hypokinesis with apical dyskinesis. Features are   consistent with a pseudonormal left  ventricular filling pattern,   with concomitant abnormal relaxation and increased filling   pressure (grade 2 diastolic dysfunction). Doppler parameters are   consistent with indeterminate ventricular filling pressure. - Aortic valve: Transvalvular velocity was within the normal range.   There was no stenosis. There was trivial regurgitation. - Left atrium: The atrium was mildly dilated. - Right ventricle: The cavity size was normal. Wall thickness was   normal. Systolic function was normal. - Tricuspid valve: There was mild regurgitation. - Pulmonary arteries: Systolic pressure was within the normal   range. PA peak pressure: 30 mm Hg (S).       ASSESSMENT:    1. Exertional angina (HCC)   2. Coronary artery disease involving native coronary artery of native heart with other form of angina pectoris (HCC)   3. Ischemic cardiomyopathy   4. Essential hypertension   5. Hyperlipidemia, unspecified hyperlipidemia type      PLAN:  In order of problems listed above:  Exertional angina patient is having chest tightness and dyspnea on exertion and now has a high risk nuclear stress test 02/16/17 see above for details.Dr. Clifton James recommends cardica catheterization. Will draw labs today. Results of stress test discussed with patient and he's agreeable to proceed. I have reviewed the risks, indications, and alternatives to angioplasty and stenting with the patient. Risks include but are not limited to bleeding, infection, vascular injury, stroke, myocardial infection, arrhythmia, kidney injury, radiation-related injury in the case of prolonged fluoroscopy use, emergency cardiac surgery, and death. The patient understands the risks of serious complication is low (<1%) and he agrees to proceed.     CAD status post BMS to the LAD in 2008 and DES to the RCA in 2014 with 50% circumflex.  Ischemic Cardiomyopathy LVEF 35-40% on 2-D echo 02/16/17  Essential Hypertension blood pressure  controlled  Hyperlipidemia managed by primary care          Medication Adjustments/Labs and Tests Ordered: Current medicines are reviewed at length with the patient today.  Concerns regarding medicines are outlined above.  Medication changes, Labs and Tests ordered today are listed in the Patient Instructions below. Patient Instructions  Medication Instructions:   Your physician recommends that you continue on your current medications as directed. Please refer to the Current Medication  list given to you today.    If you need a refill on your cardiac medications before your next appointment, please call your pharmacy.  Labwork: CBC BMET TODAY        Testing/Procedures: SEE LETTER FOR LEFT HEART CATH ON 02-26-2017   Follow 2 WEEKS POST CATH WITH Jason Frisbee    Any Other Special Instructions Will Be Listed Below (If Applicable).                                                                                                                                                      Elson Clan, [name]  02/20/2017 3:03 PM    [facility] [address] [place], [place], [place]  [address] Phone: [phone]; Fax: [phone]

## 2017-02-21 LAB — CBC
Hematocrit: 41 % (ref 37.5–51.0)
Hemoglobin: 12.8 g/dL — ABNORMAL LOW (ref 13.0–17.7)
MCH: 22.9 pg — ABNORMAL LOW (ref 26.6–33.0)
MCHC: 31.2 g/dL — ABNORMAL LOW (ref 31.5–35.7)
MCV: 73 fL — ABNORMAL LOW (ref 79–97)
Platelets: 213 10*3/uL (ref 150–379)
RBC: 5.59 x10E6/uL (ref 4.14–5.80)
RDW: 15.6 % — ABNORMAL HIGH (ref 12.3–15.4)
WBC: 7.8 10*3/uL (ref 3.4–10.8)

## 2017-02-21 LAB — BASIC METABOLIC PANEL
BUN/Creatinine Ratio: 16 (ref 9–20)
BUN: 20 mg/dL (ref 6–24)
CO2: 28 mmol/L (ref 20–29)
Calcium: 9.2 mg/dL (ref 8.7–10.2)
Chloride: 101 mmol/L (ref 96–106)
Creatinine, Ser: 1.25 mg/dL (ref 0.76–1.27)
GFR calc Af Amer: 75 mL/min/{1.73_m2} (ref >59)
GFR calc non Af Amer: 65 mL/min/{1.73_m2} (ref >59)
Glucose: 73 mg/dL (ref 65–99)
Potassium: 4 mmol/L (ref 3.5–5.2)
Sodium: 142 mmol/L (ref 134–144)

## 2017-02-21 LAB — APTT: aPTT: 29 s (ref 24–33)

## 2017-02-23 ENCOUNTER — Telehealth: Payer: Self-pay

## 2017-02-23 NOTE — Telephone Encounter (Signed)
Patient contacted pre-catheterization at [facility] scheduled for:  02/26/2017 @ 0730 Verified arrival time and place:  NT @ 0530 Confirmed AM meds to be taken pre-cath with sip of water: Take ASA/Plavix Hold HCTZ Confirmed patient has responsible person to drive home post procedure and observe patient for 24 hours:  Yes Addl concerns:  none

## 2017-02-26 ENCOUNTER — Other Ambulatory Visit: Payer: Self-pay

## 2017-02-26 ENCOUNTER — Ambulatory Visit (HOSPITAL_COMMUNITY)
Admission: RE | Admit: 2017-02-26 | Discharge: 2017-02-26 | Disposition: A | Discharge status: Home / Self Care | Payer: Federal, State, Local not specified - PPO | Source: Ambulatory Visit | Attending: Cardiovascular Disease | Admitting: Cardiovascular Disease

## 2017-02-26 ENCOUNTER — Encounter (HOSPITAL_COMMUNITY)
Admission: RE | Disposition: A | Discharge status: Home / Self Care | Payer: Self-pay | Source: Ambulatory Visit | Attending: Cardiovascular Disease

## 2017-02-26 DIAGNOSIS — I119 Hypertensive heart disease without heart failure: Secondary | ICD-10-CM | POA: Diagnosis not present

## 2017-02-26 DIAGNOSIS — E785 Hyperlipidemia, unspecified: Secondary | ICD-10-CM | POA: Insufficient documentation

## 2017-02-26 DIAGNOSIS — Z955 Presence of coronary angioplasty implant and graft: Secondary | ICD-10-CM | POA: Insufficient documentation

## 2017-02-26 DIAGNOSIS — M549 Dorsalgia, unspecified: Secondary | ICD-10-CM | POA: Insufficient documentation

## 2017-02-26 DIAGNOSIS — Z7982 Long term (current) use of aspirin: Secondary | ICD-10-CM | POA: Diagnosis not present

## 2017-02-26 DIAGNOSIS — I2510 Atherosclerotic heart disease of native coronary artery without angina pectoris: Secondary | ICD-10-CM | POA: Diagnosis present

## 2017-02-26 DIAGNOSIS — R3911 Hesitancy of micturition: Secondary | ICD-10-CM | POA: Diagnosis not present

## 2017-02-26 DIAGNOSIS — I252 Old myocardial infarction: Secondary | ICD-10-CM | POA: Insufficient documentation

## 2017-02-26 DIAGNOSIS — F329 Major depressive disorder, single episode, unspecified: Secondary | ICD-10-CM | POA: Insufficient documentation

## 2017-02-26 DIAGNOSIS — F17210 Nicotine dependence, cigarettes, uncomplicated: Secondary | ICD-10-CM | POA: Insufficient documentation

## 2017-02-26 DIAGNOSIS — I25110 Atherosclerotic heart disease of native coronary artery with unstable angina pectoris: Secondary | ICD-10-CM

## 2017-02-26 DIAGNOSIS — Z79899 Other long term (current) drug therapy: Secondary | ICD-10-CM | POA: Diagnosis not present

## 2017-02-26 DIAGNOSIS — G4733 Obstructive sleep apnea (adult) (pediatric): Secondary | ICD-10-CM | POA: Diagnosis not present

## 2017-02-26 DIAGNOSIS — I255 Ischemic cardiomyopathy: Secondary | ICD-10-CM | POA: Insufficient documentation

## 2017-02-26 DIAGNOSIS — I200 Unstable angina: Secondary | ICD-10-CM

## 2017-02-26 DIAGNOSIS — F419 Anxiety disorder, unspecified: Secondary | ICD-10-CM | POA: Insufficient documentation

## 2017-02-26 DIAGNOSIS — I208 Other forms of angina pectoris: Secondary | ICD-10-CM

## 2017-02-26 DIAGNOSIS — Z91048 Other nonmedicinal substance allergy status: Secondary | ICD-10-CM | POA: Insufficient documentation

## 2017-02-26 DIAGNOSIS — J45909 Unspecified asthma, uncomplicated: Secondary | ICD-10-CM | POA: Insufficient documentation

## 2017-02-26 DIAGNOSIS — I2582 Chronic total occlusion of coronary artery: Secondary | ICD-10-CM | POA: Insufficient documentation

## 2017-02-26 HISTORY — PX: CORONARY STENT INTERVENTION: CATH118234

## 2017-02-26 HISTORY — PX: CORONARY PRESSURE/FFR STUDY: CATH118243

## 2017-02-26 HISTORY — PX: LEFT HEART CATH AND CORONARY ANGIOGRAPHY: CATH118249

## 2017-02-26 LAB — POCT ACTIVATED CLOTTING TIME
Activated Clotting Time: 279 seconds
Activated Clotting Time: 499 seconds

## 2017-02-26 LAB — PROTIME-INR
INR: 1.01
Prothrombin Time: 13.2 seconds (ref 11.4–15.2)

## 2017-02-26 SURGERY — LEFT HEART CATH AND CORONARY ANGIOGRAPHY
Anesthesia: LOCAL

## 2017-02-26 MED ORDER — FENTANYL CITRATE (PF) 100 MCG/2ML IJ SOLN
INTRAMUSCULAR | Status: DC | PRN
  Administered 2017-02-26: 50 ug via INTRAVENOUS

## 2017-02-26 MED ORDER — HEPARIN SODIUM (PORCINE) 1000 UNIT/ML IJ SOLN
INTRAMUSCULAR | Status: AC
  Filled 2017-02-26: qty 1

## 2017-02-26 MED ORDER — HEPARIN SODIUM (PORCINE) 1000 UNIT/ML IJ SOLN
INTRAMUSCULAR | Status: DC | PRN
  Administered 2017-02-26: 5000 [IU] via INTRAVENOUS
  Administered 2017-02-26: 3000 [IU] via INTRAVENOUS
  Administered 2017-02-26: 5000 [IU] via INTRAVENOUS

## 2017-02-26 MED ORDER — NITROGLYCERIN IN D5W 200-5 MCG/ML-% IV SOLN
INTRAVENOUS | Status: AC
Start: 2017-02-26 — End: ?
  Filled 2017-02-26: qty 250

## 2017-02-26 MED ORDER — ONDANSETRON HCL 4 MG/2ML IJ SOLN: 4.0000 mg | Freq: Four times a day (QID) | INTRAMUSCULAR | Status: DC | PRN

## 2017-02-26 MED ORDER — VERAPAMIL HCL 2.5 MG/ML IV SOLN
INTRAVENOUS | Status: DC | PRN
  Administered 2017-02-26: 10 mL via INTRA_ARTERIAL

## 2017-02-26 MED ORDER — HEPARIN (PORCINE) IN NACL 2-0.9 UNIT/ML-% IJ SOLN
INTRAMUSCULAR | Status: AC
  Filled 2017-02-26: qty 1000

## 2017-02-26 MED ORDER — ADENOSINE (DIAGNOSTIC) 140MCG/KG/MIN
INTRAVENOUS | Status: DC | PRN
  Administered 2017-02-26: 140 ug/kg/min via INTRAVENOUS

## 2017-02-26 MED ORDER — SODIUM CHLORIDE 0.9% FLUSH: 3.0000 mL | INTRAVENOUS | Status: DC | PRN

## 2017-02-26 MED ORDER — FENTANYL CITRATE (PF) 100 MCG/2ML IJ SOLN
INTRAMUSCULAR | Status: AC
  Filled 2017-02-26: qty 2

## 2017-02-26 MED ORDER — LABETALOL HCL 5 MG/ML IV SOLN: 10.0000 mg | INTRAVENOUS | Status: AC | PRN

## 2017-02-26 MED ORDER — LIDOCAINE HCL 2 % IJ SOLN
INTRAMUSCULAR | Status: AC
  Filled 2017-02-26: qty 20

## 2017-02-26 MED ORDER — SODIUM CHLORIDE 0.9 % WEIGHT BASED INFUSION: 1.0000 mL/kg/h | INTRAVENOUS | Status: DC

## 2017-02-26 MED ORDER — SODIUM CHLORIDE 0.9% FLUSH: 3.0000 mL | Freq: Two times a day (BID) | INTRAVENOUS | Status: DC

## 2017-02-26 MED ORDER — IOPAMIDOL (ISOVUE-370) INJECTION 76%
INTRAVENOUS | Status: DC | PRN
  Administered 2017-02-26: 170 mL via INTRA_ARTERIAL

## 2017-02-26 MED ORDER — CLOPIDOGREL BISULFATE 75 MG PO TABS: 75.0000 mg | ORAL_TABLET | Freq: Every day | ORAL | Status: DC

## 2017-02-26 MED ORDER — AMLODIPINE BESYLATE 5 MG PO TABS: 10.0000 mg | ORAL_TABLET | Freq: Every day | ORAL | Status: DC

## 2017-02-26 MED ORDER — MIDAZOLAM HCL 2 MG/2ML IJ SOLN
INTRAMUSCULAR | Status: DC | PRN
  Administered 2017-02-26: 2 mg via INTRAVENOUS

## 2017-02-26 MED ORDER — CITALOPRAM HYDROBROMIDE 20 MG PO TABS: 20.0000 mg | ORAL_TABLET | Freq: Every day | ORAL | Status: DC

## 2017-02-26 MED ORDER — IOPAMIDOL (ISOVUE-370) INJECTION 76%
INTRAVENOUS | Status: AC
  Filled 2017-02-26: qty 50

## 2017-02-26 MED ORDER — SODIUM CHLORIDE 0.9 % WEIGHT BASED INFUSION
3.0000 mL/kg/h | INTRAVENOUS | Status: AC
  Administered 2017-02-26: 3 mL/kg/h via INTRAVENOUS

## 2017-02-26 MED ORDER — MIDAZOLAM HCL 2 MG/2ML IJ SOLN
INTRAMUSCULAR | Status: AC
  Filled 2017-02-26: qty 2

## 2017-02-26 MED ORDER — HEPARIN (PORCINE) IN NACL 2-0.9 UNIT/ML-% IJ SOLN
INTRAMUSCULAR | Status: AC | PRN
  Administered 2017-02-26: 1500 mL

## 2017-02-26 MED ORDER — LIDOCAINE HCL 2 % IJ SOLN
INTRAMUSCULAR | Status: DC | PRN
  Administered 2017-02-26: 2 mL

## 2017-02-26 MED ORDER — ASPIRIN EC 81 MG PO TBEC: 81.0000 mg | DELAYED_RELEASE_TABLET | Freq: Every day | ORAL | Status: DC

## 2017-02-26 MED ORDER — ALISKIREN FUMARATE 150 MG PO TABS: 300.0000 mg | ORAL_TABLET | Freq: Every day | ORAL | Status: DC

## 2017-02-26 MED ORDER — NITROGLYCERIN 1 MG/10 ML FOR IR/CATH LAB
INTRA_ARTERIAL | Status: DC | PRN
  Administered 2017-02-26: 200 ug via INTRACORONARY

## 2017-02-26 MED ORDER — HYDRALAZINE HCL 20 MG/ML IJ SOLN: 5.0000 mg | INTRAMUSCULAR | Status: AC | PRN

## 2017-02-26 MED ORDER — VERAPAMIL HCL 2.5 MG/ML IV SOLN
INTRAVENOUS | Status: AC
  Filled 2017-02-26: qty 2

## 2017-02-26 MED ORDER — ADENOSINE 12 MG/4ML IV SOLN
INTRAVENOUS | Status: AC
  Filled 2017-02-26: qty 16

## 2017-02-26 MED ORDER — IOPAMIDOL (ISOVUE-370) INJECTION 76%
INTRAVENOUS | Status: AC
  Filled 2017-02-26: qty 100

## 2017-02-26 MED ORDER — NITROGLYCERIN 0.4 MG SL SUBL: 0.4000 mg | SUBLINGUAL_TABLET | SUBLINGUAL | Status: DC | PRN

## 2017-02-26 MED ORDER — NITROGLYCERIN 1 MG/10 ML FOR IR/CATH LAB
INTRA_ARTERIAL | Status: AC
  Filled 2017-02-26: qty 10

## 2017-02-26 MED ORDER — ACETAMINOPHEN 325 MG PO TABS: 650.0000 mg | ORAL_TABLET | ORAL | Status: DC | PRN

## 2017-02-26 MED ORDER — ASPIRIN 81 MG PO CHEW: 81.0000 mg | CHEWABLE_TABLET | ORAL | Status: DC

## 2017-02-26 MED ORDER — TAMSULOSIN HCL 0.4 MG PO CAPS: 0.4000 mg | ORAL_CAPSULE | Freq: Every day | ORAL | Status: DC

## 2017-02-26 MED ORDER — SODIUM CHLORIDE 0.9 % IV SOLN: INTRAVENOUS | Status: AC

## 2017-02-26 MED ORDER — SODIUM CHLORIDE 0.9 % IV SOLN: 250.0000 mL | INTRAVENOUS | Status: DC | PRN

## 2017-02-26 MED ORDER — ANGIOPLASTY BOOK
Freq: Once | Status: DC
  Filled 2017-02-26: qty 1

## 2017-02-26 MED ORDER — HYDROCHLOROTHIAZIDE 25 MG PO TABS: 25.0000 mg | ORAL_TABLET | Freq: Every day | ORAL | Status: DC

## 2017-02-26 MED ORDER — FINASTERIDE 5 MG PO TABS: 5.0000 mg | ORAL_TABLET | Freq: Every day | ORAL | Status: DC

## 2017-02-26 MED ORDER — ATORVASTATIN CALCIUM 20 MG PO TABS: 20.0000 mg | ORAL_TABLET | Freq: Every day | ORAL | Status: DC

## 2017-02-26 SURGICAL SUPPLY — 18 items
BALLN ~~LOC~~ EMERGE MR 3.0X8 (BALLOONS) ×2
BALLOON ~~LOC~~ EMERGE MR 3.0X8 (BALLOONS) IMPLANT
CATH IMPULSE 5F ANG/FL3.5 (CATHETERS) ×1 IMPLANT
CATH MICROCATH NAVVUS (MICROCATHETER) IMPLANT
CATH VISTA GUIDE 6FR XBLAD3.5 (CATHETERS) ×1 IMPLANT
DEVICE RAD COMP TR BAND LRG (VASCULAR PRODUCTS) ×1 IMPLANT
GLIDESHEATH SLEND SS 6F .021 (SHEATH) ×1 IMPLANT
GUIDEWIRE INQWIRE 1.5J.035X260 (WIRE) IMPLANT
INQWIRE 1.5J .035X260CM (WIRE) ×2
KIT ENCORE 26 ADVANTAGE (KITS) ×1 IMPLANT
KIT ESSENTIALS PG (KITS) ×1 IMPLANT
KIT HEART LEFT (KITS) ×2 IMPLANT
MICROCATHETER NAVVUS (MICROCATHETER) ×2
PACK CARDIAC CATHETERIZATION (CUSTOM PROCEDURE TRAY) ×2 IMPLANT
STENT SIERRA 2.75 X 12 MM (Permanent Stent) ×1 IMPLANT
TRANSDUCER W/STOPCOCK (MISCELLANEOUS) ×2 IMPLANT
TUBING CIL FLEX 10 FLL-RA (TUBING) ×2 IMPLANT
WIRE COUGAR XT STRL 190CM (WIRE) ×1 IMPLANT

## 2017-02-26 NOTE — H&P (View-Only) (Signed)
 Cardiology Office Note    Date:  02/20/2017   ID:  Austin Ingram, DOB 02/12/1961, MRN [id number]  PCP:  Mitchell, L.Dean, MD  Cardiologist: Dr. McAlhany  Chief Complaint  Patient presents with  . Follow-up    History of Present Illness:  Austin Ingram is a 56 y.o. male with history of ischemic cardiomyopathy, CAD status post BMS to the LAD in 2008. Last cath in 2014 patent LAD stents and 50% mid circumflex stenosis with severe disease in the RCA. Had DES to the RCA. Last echo 2009 LVEF 35-40% with periapical akinesis and anterior hypokinesis and mild left atrial enlargement. Did not tolerate ACE inhibitor secondary to cough. Didn't tolerate ARB secondary to dizziness. Not on beta blocker secondary to bradycardia. Nuclear stress test 06/2013 no ischemia LVEF 34%.  Last saw Dr.McAlhany 02/01/17 was having some dyspnea on exertion so exercise nuclear stress test was ordered. Nuclear stress test 02/16/17 showed EF of 34% and was read as high risk with large prior anterior septal and apical infarct, mild ischemia in the basal inferior wall akinesis of the anterior septal wall and apex severe left ventricular enlargement. Dr.McAlhany recommended cardiac catheterization.2-D echo showed LVEF 35-40% with global hypokinesis. Grade 2 DD.  Patient comes in today accompanied by his wife and granddaughter. He says now that when he walks a short distance he does get chest tightness and shortness of breath. He says it's becoming more frequent with less exertion. If he does any repetitive work he has chest pain. Smokes marijuana daily, 2 joints.  Past Medical History:  Diagnosis Date  . Allergic rhinitis   . Anxiety   . Arthritis    "lower back, right thumb, knees" (10/04/2012)  . Asthma    "grew out of it" (10/04/2012)  . Coronary artery disease    a. s/p prior stent to LAD;  b. LHC 6/14: LAD stent patent, mCFX 50%, pRCA 95-99%, mRCA 75%, EF 45% with inf HK => PCI: Promus DES to pRCA  . Depression   .  Hyperlipidemia   . Hypertension   . MI (myocardial infarction) (HCC) 03/2007  . OSA (obstructive sleep apnea)    mild  . Pneumonia    "as a child" (10/04/2012)    Past Surgical History:  Procedure Laterality Date  . ARTHROPLASTY Right 1993   'crushed; removed bone fragments" (10/04/2012)  . CARDIAC CATHETERIZATION  2010  . CORONARY ANGIOPLASTY WITH STENT PLACEMENT  2008; 10/04/2012   "1 + 1" (10/04/2012)  . EXPLORATORY LAPAROTOMY  1990's?   "went in to repair hernia; found fatty tissue instead; no hernia repair" (10/04/2012)  . LEFT HEART CATHETERIZATION WITH CORONARY ANGIOGRAM N/A 10/04/2012   Procedure: LEFT HEART CATHETERIZATION WITH CORONARY ANGIOGRAM;  Surgeon: Michael Cooper, MD;  Location: [facility] CATH LAB;  Service: Cardiovascular;  Laterality: N/A;    Current Medications: Current Meds  Medication Sig  . aliskiren (TEKTURNA) 300 MG tablet Take 300 mg by mouth daily.   . amLODipine (NORVASC) 10 MG tablet TAKE 1 TABLET (10 MG TOTAL) BY MOUTH DAILY.  . aspirin 81 MG tablet Take 81 mg by mouth daily.   . citalopram (CELEXA) 20 MG tablet Take 20 mg by mouth daily.   . clopidogrel (PLAVIX) 75 MG tablet TAKE 1 TABLET BY MOUTH EVERY DAY WITH BREAKFAST  . etodolac (LODINE) 400 MG tablet Take 1 tablet by mouth 2 (two) times daily as needed. For  pain  . finasteride (PROSCAR) 5 MG tablet Take 5 mg by mouth daily.  .   hydrochlorothiazide (HYDRODIURIL) 25 MG tablet TAKE 1 TABLET (25 MG TOTAL) BY MOUTH DAILY.  . HYDROcodone-acetaminophen (NORCO/VICODIN) 5-325 MG per tablet Take 1 tablet by mouth every 4 (four) hours as needed for pain.  . isosorbide mononitrate (IMDUR) 30 MG 24 hr tablet Take 1 tablet (30 mg total) by mouth daily.  . nitroGLYCERIN (NITROSTAT) 0.4 MG SL tablet PLACE 1 TABLET UNDER THE TONGUE EVERY 5 MINUTES AS NEEDED FOR CHEST PAIN.  . pravastatin (PRAVACHOL) 80 MG tablet Take 80 mg by mouth daily.  . tamsulosin (FLOMAX) 0.4 MG CAPS capsule Take 1 capsule by mouth daily.      Allergies:   Patient has no known allergies.   Social History   Social History  . Marital status: Married    Spouse name: N/A  . Number of children: 1  . Years of education: N/A   Occupational History  .  [employer]   Social History Main Topics  . Smoking status: Former Smoker    Packs/day: 0.50    Years: 20.00    Types: Cigarettes    Quit date: 12/31/1998  . Smokeless tobacco: Never Used  . Alcohol use 0.0 oz/week     Comment: 10/04/2012 "1 beer plus 2 mixed drinks once/month"  . Drug use: No  . Sexual activity: Yes   Other Topics Concern  . None   Social History Narrative  . None     Family History:  The patient's family history includes Atrial fibrillation in his mother; CAD in his maternal grandfather and maternal grandmother; Diabetes type II in his mother; Hypertension in his mother.   ROS:   Please see the history of present illness.    Review of Systems  Constitution: Negative.  HENT: Negative.   Cardiovascular: Positive for chest pain and dyspnea on exertion.  Respiratory: Positive for snoring and wheezing.   Endocrine: Negative.   Hematologic/Lymphatic: Negative.   Musculoskeletal: Positive for back pain and myalgias.  Gastrointestinal: Negative.   Genitourinary: Positive for hesitancy.  Neurological: Positive for headaches.  Psychiatric/Behavioral: Positive for depression. The patient is nervous/anxious.    All other systems reviewed and are negative.   PHYSICAL EXAM:   VS:  BP 118/78   Pulse (!) 55   Ht 5' 9" (1.753 m)   Wt 205 lb 8 oz (93.2 kg)   BMI 30.35 kg/m   Physical Exam  GEN: Well nourished, well developed, in no acute distress  HEENT: normal  Neck: no JVD, carotid bruits, or masses Cardiac:RRR; Positive S4, 2/6 systolic murmur at the left sternal border Respiratory:  Decreased breath sounds with scattered crackles GI: soft, nontender, nondistended, + BS Ext: without cyanosis, clubbing, or edema, Good distal pulses  bilaterally MS: no deformity or atrophy  Skin: warm and dry, no rash Neuro:  Alert and Oriented x 3, Strength and sensation are intact Psych: euthymic mood, full affect  Wt Readings from Last 3 Encounters:  02/20/17 205 lb 8 oz (93.2 kg)  02/01/17 201 lb (91.2 kg)  01/21/16 203 lb 6.4 oz (92.3 kg)      Studies/Labs Reviewed:   EKG:  EKG is not ordered today.    Recent Labs: No results found for requested labs within last 8760 hours.   Lipid Panel    Component Value Date/Time   CHOL 136 09/03/2012 0951   TRIG 95.0 09/03/2012 0951   HDL 43.20 09/03/2012 0951   CHOLHDL 3 09/03/2012 0951   VLDL 19.0 09/03/2012 0951   LDLCALC 74 09/03/2012 0951     LDLDIRECT 59.9 06/03/2007 1114    Additional studies/ records that were reviewed today include:   Nuclear stress test 10/19/18Nuclear stress EF: 34%.  There was no ST segment deviation noted during stress.  Defect 1: There is a large defect of severe severity present in the basal anteroseptal, mid anteroseptal, apical anterior, apical septal, apical inferior, apical lateral and apex location.  Defect 2: There is a small defect of moderate severity present in the basal inferior location.  This is a high risk study.  Findings consistent with ischemia and prior myocardial infarction.  The left ventricular ejection fraction is moderately decreased (30-44%).   High risk stress nuclear study with large prior anteroseptal and apical infarct; mild ischemia in the basal inferior wall; EF 34 with akinesis of the anteroseptal wall and apex; severe LVE; study high risk due to reduced LV function.   2-D echo 10/19/18Study Conclusions   - Left ventricle: The cavity size was normal. There was mild focal   basal hypertrophy of the septum. Systolic function was moderately   reduced. The estimated ejection fraction was in the range of 35%   to 40%. Global hypokinesis with apical dyskinesis. Features are   consistent with a pseudonormal left  ventricular filling pattern,   with concomitant abnormal relaxation and increased filling   pressure (grade 2 diastolic dysfunction). Doppler parameters are   consistent with indeterminate ventricular filling pressure. - Aortic valve: Transvalvular velocity was within the normal range.   There was no stenosis. There was trivial regurgitation. - Left atrium: The atrium was mildly dilated. - Right ventricle: The cavity size was normal. Wall thickness was   normal. Systolic function was normal. - Tricuspid valve: There was mild regurgitation. - Pulmonary arteries: Systolic pressure was within the normal   range. PA peak pressure: 30 mm Hg (S).       ASSESSMENT:    1. Exertional angina (HCC)   2. Coronary artery disease involving native coronary artery of native heart with other form of angina pectoris (HCC)   3. Ischemic cardiomyopathy   4. Essential hypertension   5. Hyperlipidemia, unspecified hyperlipidemia type      PLAN:  In order of problems listed above:  Exertional angina patient is having chest tightness and dyspnea on exertion and now has a high risk nuclear stress test 02/16/17 see above for details.Dr. McAlhany recommends cardica catheterization. Will draw labs today. Results of stress test discussed with patient and he's agreeable to proceed. I have reviewed the risks, indications, and alternatives to angioplasty and stenting with the patient. Risks include but are not limited to bleeding, infection, vascular injury, stroke, myocardial infection, arrhythmia, kidney injury, radiation-related injury in the case of prolonged fluoroscopy use, emergency cardiac surgery, and death. The patient understands the risks of serious complication is low (<1%) and he agrees to proceed.     CAD status post BMS to the LAD in 2008 and DES to the RCA in 2014 with 50% circumflex.  Ischemic Cardiomyopathy LVEF 35-40% on 2-D echo 02/16/17  Essential Hypertension blood pressure  controlled  Hyperlipidemia managed by primary care          Medication Adjustments/Labs and Tests Ordered: Current medicines are reviewed at length with the patient today.  Concerns regarding medicines are outlined above.  Medication changes, Labs and Tests ordered today are listed in the Patient Instructions below. Patient Instructions  Medication Instructions:   Your physician recommends that you continue on your current medications as directed. Please refer to the Current Medication   list given to you today.    If you need a refill on your cardiac medications before your next appointment, please call your pharmacy.  Labwork: CBC BMET TODAY        Testing/Procedures: SEE LETTER FOR LEFT HEART CATH ON 02-26-2017   Follow 2 WEEKS POST CATH WITH Arantza Darrington    Any Other Special Instructions Will Be Listed Below (If Applicable).                                                                                                                                                      Signed, Vernette Moise, [name]  02/20/2017 3:03 PM    [facility] [address], [place], [place]  [address] Phone: [phone]; Fax: [phone]    

## 2017-02-26 NOTE — Discharge Instructions (Signed)

## 2017-02-26 NOTE — Progress Notes (Signed)
[id number] Education completed with pt and wife who voiced understanding. Pt sleepy. Stressed importance of plavix with stent. Reviewed NTG use, risk factors, ex ed, heart healthy food choices and watching sodium. Pt did not recall if he had CHF booklet or not, so I sent one over after ed. Discussed with pt the need for daily weights and when to call MD with weight gain. Reviewed 2000 mg sodium restriction and gave handouts. Discussed watching for signs of fluid on body. Gave smoking cessation handout as pt stated he occasionally smokes. Did not want fake cigarette. Discussed CRP 2 and will refer to [place] program. Luetta Nutting RN BSN 02/26/2017 11:14 AM

## 2017-02-26 NOTE — Interval H&P Note (Signed)
History and Physical Interval Note:  02/26/2017 7:22 AM  Austin Ingram  has presented today for cardiac cath with the diagnosis of CAD with unstable angina/abnormal stress test  The various methods of treatment have been discussed with the patient and family. After consideration of risks, benefits and other options for treatment, the patient has consented to  Procedure(s): LEFT HEART CATH AND CORONARY ANGIOGRAPHY (N/A) as a surgical intervention .  The patient's history has been reviewed, patient examined, no change in status, stable for surgery.  I have reviewed the patient's chart and labs.  Questions were answered to the patient's satisfaction.    Cath Lab Visit (complete for each Cath Lab visit)  Clinical Evaluation Leading to the Procedure:   ACS: No.  Non-ACS:    Anginal Classification: CCS III  Anti-ischemic medical therapy: Minimal Therapy (1 class of medications)  Non-Invasive Test Results: High-risk stress test findings: cardiac mortality >3%/year  Prior CABG: No previous CABG         Verne Carrow

## 2017-02-26 NOTE — Discharge Summary (Signed)
Discharge Summary    Patient ID: Austin Ingram,  MRN: [id number], DOB/AGE: 1960-09-05 56 y.o.  Admit date: 02/26/2017 Discharge date: 02/26/2017  Primary Care Provider: Clovis RileyMitchell, L.Dean Primary Cardiologist: Clifton JamesMcAlhany   Discharge Diagnoses    Active Problems:   Unstable angina (HCC)   Allergies Allergies  Allergen Reactions  . Adhesive [Tape] Other (See Comments)    SKIN SCARRING/IRRITATION. PAPER TAPE IS OKAY    Diagnostic Studies/Procedures    Cath: 02/26/17  Conclusion     Ost RCA to Prox RCA lesion, 40 %stenosed.  Prox RCA lesion, 60 %stenosed.  Mid RCA lesion, 100 %stenosed.  1st Mrg lesion, 30 %stenosed.  Mid Cx lesion, 75 %stenosed.  Ost LAD to Prox LAD lesion, 20 %stenosed.  1st Diag lesion, 60 %stenosed.   1. Triple vessel CAD 2. Patent stent proximal LAD with minimal in stent restenosis 3. Severe stenosis distal Circumflex artery 4. Successful PTCA/DES x 1 distal Circumflex 5. Patent proximal RCA stent followed by chronic total occlusion of RCA in the mid segment. The distal RCA fills from left to right collaterals.   Recommendations: Will continue DAPT with ASA and Plavix. Continue statin. He does not tolerate ARB,Ace-inh or beta blockers. Same day PCI discharge. Will need office follow up in 1-2 weeks.   _____________   History of Present Illness     Austin Ingram is a 56 y.o. male with history of ischemic cardiomyopathy, CAD status post BMS to the LAD in 2008. Last cath in 2014 patent LAD stents and 50% mid circumflex stenosis with severe disease in the RCA. Had DES to the RCA. Last echo 2009 LVEF 35-40% with periapical akinesis and anterior hypokinesis and mild left atrial enlargement. Did not tolerate ACE inhibitor secondary to cough. Didn't tolerate ARB secondary to dizziness. Not on beta blocker secondary to bradycardia. Nuclear stress test 06/2013 no ischemia LVEF 34%.  Last saw Dr.Heaven Wandell 02/01/17 was having some dyspnea on exertion so  exercise nuclear stress test was ordered. Nuclear stress test 02/16/17 showed EF of 34% and was read as high risk with large prior anterior septal and apical infarct, mild ischemia in the basal inferior wall akinesis of the anterior septal wall and apex severe left ventricular enlargement. Dr.Marie Borowski recommended cardiac catheterization.2-D echo showed LVEF 35-40% with global hypokinesis. Grade 2 DD.  Patient presented back to the office on 02/20/17 for follow up after stress testing. He reported when he walked a short distance he would get chest tightness and shortness of breath. He stated was becoming more frequent with less exertion. If he did any repetitive work he would develop chest pain. Smokes marijuana daily, 2 joints. His stress test and symptoms were discussed with Dr. Clifton JamesMcAlhany and he was referred for outpatient cath.   Hospital Course     Underwent outpatient cath with Dr. Clifton JamesMcAlhany noted above with patent stent in the pLAD, with successful PTCA/DES x1 to the dLcx. Patient pRCA stent followed by a CTO in the mRCA with left to right collaterals. Plan for DAPT with ASA/plavix. Currently on statin. He has been intolerant to ACEi/ARB and BB in the past. Post cath radial site stable. He was seen by cardiac rehab while in short stay. Instructions/precautions given prior to discharge. Work note given at discharge.   Austin Ingram was seen by Dr. Clifton JamesMcAlhany and determined stable for discharge home. Follow up in the office has been arranged. Medications are listed below.   _____________  Discharge Vitals Blood pressure 119/70, pulse (!) 54, temperature (!)  97.5 F (36.4 C), resp. rate 18, height 5\' 9"  (1.753 m), weight 205 lb (93 kg), SpO2 99 %.  Filed Weights   02/26/17 0551  Weight: 205 lb (93 kg)    Labs & Radiologic Studies    CBC No results for input(s): WBC, NEUTROABS, HGB, HCT, MCV, PLT in the last 72 hours. Basic Metabolic Panel No results for input(s): NA, K, CL, CO2, GLUCOSE, BUN,  CREATININE, CALCIUM, MG, PHOS in the last 72 hours. Liver Function Tests No results for input(s): AST, ALT, ALKPHOS, BILITOT, PROT, ALBUMIN in the last 72 hours. No results for input(s): LIPASE, AMYLASE in the last 72 hours. Cardiac Enzymes No results for input(s): CKTOTAL, CKMB, CKMBINDEX, TROPONINI in the last 72 hours. BNP Invalid input(s): POCBNP D-Dimer No results for input(s): DDIMER in the last 72 hours. Hemoglobin A1C No results for input(s): HGBA1C in the last 72 hours. Fasting Lipid Panel No results for input(s): CHOL, HDL, LDLCALC, TRIG, CHOLHDL, LDLDIRECT in the last 72 hours. Thyroid Function Tests No results for input(s): TSH, T4TOTAL, T3FREE, THYROIDAB in the last 72 hours.  Invalid input(s): FREET3 _____________  No results found. Disposition   Pt is being discharged home today in good condition.  Follow-up Plans & Appointments    Follow-up Information    Kathleene Hazel, MD Follow up on 03/09/2017.   Specialty:  Cardiology Why:  at 10:40am for your follow up appt.  Contact information: [address] [place] [address] [phone]          Discharge Instructions    Amb Referral to Cardiac Rehabilitation    Complete by:  As directed    Diagnosis:  Coronary Stents   Call MD for:  redness, tenderness, or signs of infection (pain, swelling, redness, odor or green/yellow discharge around incision site)    Complete by:  As directed    Diet - low sodium heart healthy    Complete by:  As directed    Discharge instructions    Complete by:  As directed    Radial Site Care Refer to this sheet in the next few weeks. These instructions provide you with information on caring for yourself after your procedure. Your caregiver may also give you more specific instructions. Your treatment has been planned according to current medical practices, but problems sometimes occur. Call your caregiver if you have any problems or questions after your  procedure. HOME CARE INSTRUCTIONS You may shower the day after the procedure.Remove the bandage (dressing) and gently wash the site with plain soap and water.Gently pat the site dry.  Do not apply powder or lotion to the site.  Do not submerge the affected site in water for 3 to 5 days.  Inspect the site at least twice daily.  Do not flex or bend the affected arm for 24 hours.  No lifting over 5 pounds (2.3 kg) for 5 days after your procedure.  Do not drive home if you are discharged the same day of the procedure. Have someone else drive you.  You may drive 24 hours after the procedure unless otherwise instructed by your caregiver.  What to expect: Any bruising will usually fade within 1 to 2 weeks.  Blood that collects in the tissue (hematoma) may be painful to the touch. It should usually decrease in size and tenderness within 1 to 2 weeks.  SEEK IMMEDIATE MEDICAL CARE IF: You have unusual pain at the radial site.  You have redness, warmth, swelling, or pain at  the radial site.  You have drainage (other than a small amount of blood on the dressing).  You have chills.  You have a fever or persistent symptoms for more than 72 hours.  You have a fever and your symptoms suddenly get worse.  Your arm becomes pale, cool, tingly, or numb.  You have heavy bleeding from the site. Hold pressure on the site.   PLEASE DO NOT MISS ANY DOSES OF YOUR PLAVIX!!!!! Also keep a log of you blood pressures and bring back to your follow up appt. Please call the office with any questions.   Patients taking blood thinners should generally stay away from medicines like ibuprofen, Advil, Motrin, naproxen, and Aleve due to risk of stomach bleeding. You may take Tylenol as directed or talk to your primary doctor about alternatives.   Increase activity slowly    Complete by:  As directed       Discharge Medications     Medication List    TAKE these medications   aliskiren 300 MG tablet Commonly known  as:  TEKTURNA Take 300 mg by mouth daily with breakfast.   amLODipine 10 MG tablet Commonly known as:  NORVASC TAKE 1 TABLET (10 MG TOTAL) BY MOUTH DAILY.   aspirin EC 81 MG tablet Take 81 mg by mouth daily with breakfast.   atorvastatin 20 MG tablet Commonly known as:  LIPITOR Take 20 mg by mouth daily.   citalopram 20 MG tablet Commonly known as:  CELEXA Take 20 mg by mouth daily with breakfast.   clopidogrel 75 MG tablet Commonly known as:  PLAVIX TAKE 1 TABLET BY MOUTH EVERY DAY WITH BREAKFAST   etodolac 400 MG tablet Commonly known as:  LODINE Take 400 mg by mouth 2 (two) times daily as needed (FOR PAIN.).   finasteride 5 MG tablet Commonly known as:  PROSCAR Take 5 mg by mouth daily.   hydrochlorothiazide 25 MG tablet Commonly known as:  HYDRODIURIL TAKE 1 TABLET (25 MG TOTAL) BY MOUTH DAILY.   HYDROcodone-acetaminophen 5-325 MG tablet Commonly known as:  NORCO/VICODIN Take 1 tablet by mouth every 4 (four) hours as needed for pain.   nitroGLYCERIN 0.4 MG SL tablet Commonly known as:  NITROSTAT PLACE 1 TABLET UNDER THE TONGUE EVERY 5 MINUTES AS NEEDED FOR CHEST PAIN.   tamsulosin 0.4 MG Caps capsule Commonly known as:  FLOMAX Take 0.4 mg by mouth daily with breakfast.        Aspirin prescribed at discharge?  Yes High Intensity Statin Prescribed? (Lipitor 40-80mg  or Crestor 20-40mg ): Yes Beta Blocker Prescribed? No: Intolerant For EF <40%, was ACEI/ARB Prescribed? No: Intolerant ADP Receptor Inhibitor Prescribed? (i.e. Plavix etc.-Includes Medically Managed Patients): Yes For EF <40%, Aldosterone Inhibitor Prescribed? No: Consider as outpatient if appropriate  Was EF assessed during THIS hospitalization? No, echo within the last 30 days Was Cardiac Rehab II ordered? (Included Medically managed Patients): Yes   Outstanding Labs/Studies   N/a  Duration of Discharge Encounter   Greater than 30 minutes including physician time.  Signed, Laverda Page NP-C 02/26/2017, 4:59 PM   Pt was discharged on the same day of his PCI. He was not admitted.   Verne Carrow 02/27/2017 7:55 AM

## 2017-02-27 ENCOUNTER — Telehealth (HOSPITAL_COMMUNITY): Payer: Self-pay

## 2017-02-27 ENCOUNTER — Encounter (HOSPITAL_COMMUNITY): Payer: Self-pay | Admitting: Cardiovascular Disease

## 2017-02-27 NOTE — Telephone Encounter (Signed)
Patients insurance is active and benefits verified with [employer]. $30.00 co-pay, no deductible, out of pocket amount is $5,500/$964.97 has been met, 30% co-insurance, and no pre-authorization is required. Passport/reference [id number]

## 2017-03-05 NOTE — Telephone Encounter (Signed)
Attempted to call patient in regards to Cardiac Rehab - Lm on Vm °

## 2017-03-06 ENCOUNTER — Other Ambulatory Visit: Payer: Self-pay

## 2017-03-06 ENCOUNTER — Encounter (HOSPITAL_COMMUNITY): Payer: Self-pay | Admitting: *Deleted

## 2017-03-06 ENCOUNTER — Emergency Department (HOSPITAL_COMMUNITY): Payer: Federal, State, Local not specified - PPO

## 2017-03-06 ENCOUNTER — Observation Stay (HOSPITAL_COMMUNITY)
Admission: EM | Admit: 2017-03-06 | Discharge: 2017-03-07 | Disposition: A | Discharge status: Home / Self Care | Payer: Federal, State, Local not specified - PPO | Attending: Cardiovascular Disease | Admitting: Cardiovascular Disease

## 2017-03-06 DIAGNOSIS — G4733 Obstructive sleep apnea (adult) (pediatric): Secondary | ICD-10-CM

## 2017-03-06 DIAGNOSIS — Z79899 Other long term (current) drug therapy: Secondary | ICD-10-CM | POA: Diagnosis not present

## 2017-03-06 DIAGNOSIS — I25118 Atherosclerotic heart disease of native coronary artery with other forms of angina pectoris: Secondary | ICD-10-CM | POA: Diagnosis not present

## 2017-03-06 DIAGNOSIS — R079 Chest pain, unspecified: Secondary | ICD-10-CM | POA: Diagnosis not present

## 2017-03-06 DIAGNOSIS — E785 Hyperlipidemia, unspecified: Secondary | ICD-10-CM | POA: Diagnosis not present

## 2017-03-06 DIAGNOSIS — J45909 Unspecified asthma, uncomplicated: Secondary | ICD-10-CM | POA: Diagnosis not present

## 2017-03-06 DIAGNOSIS — Z87891 Personal history of nicotine dependence: Secondary | ICD-10-CM | POA: Insufficient documentation

## 2017-03-06 DIAGNOSIS — I10 Essential (primary) hypertension: Secondary | ICD-10-CM | POA: Diagnosis not present

## 2017-03-06 DIAGNOSIS — I2510 Atherosclerotic heart disease of native coronary artery without angina pectoris: Secondary | ICD-10-CM | POA: Diagnosis not present

## 2017-03-06 LAB — LIPID PANEL
Cholesterol: 135 mg/dL (ref 0–200)
HDL: 41 mg/dL (ref >40)
LDL Cholesterol: 77 mg/dL (ref 0–99)
Total CHOL/HDL Ratio: 3.3 RATIO
Triglycerides: 86 mg/dL (ref <150)
VLDL: 17 mg/dL (ref 0–40)

## 2017-03-06 LAB — CBC
HCT: 40.9 % (ref 39.0–52.0)
HCT: 39.2 % (ref 39.0–52.0)
Hemoglobin: 12.6 g/dL — ABNORMAL LOW (ref 13.0–17.0)
Hemoglobin: 12.3 g/dL — ABNORMAL LOW (ref 13.0–17.0)
MCH: 22.6 pg — ABNORMAL LOW (ref 26.0–34.0)
MCH: 22.9 pg — ABNORMAL LOW (ref 26.0–34.0)
MCHC: 30.8 g/dL (ref 30.0–36.0)
MCHC: 31.4 g/dL (ref 30.0–36.0)
MCV: 73.3 fL — ABNORMAL LOW (ref 78.0–100.0)
MCV: 72.9 fL — ABNORMAL LOW (ref 78.0–100.0)
Platelets: 204 10*3/uL (ref 150–400)
Platelets: 183 10*3/uL (ref 150–400)
RBC: 5.58 MIL/uL (ref 4.22–5.81)
RBC: 5.38 MIL/uL (ref 4.22–5.81)
RDW: 15.7 % — ABNORMAL HIGH (ref 11.5–15.5)
RDW: 15.7 % — ABNORMAL HIGH (ref 11.5–15.5)
WBC: 5.8 10*3/uL (ref 4.0–10.5)
WBC: 5.9 10*3/uL (ref 4.0–10.5)

## 2017-03-06 LAB — BASIC METABOLIC PANEL
Anion gap: 7 (ref 5–15)
BUN: 14 mg/dL (ref 6–20)
CO2: 28 mmol/L (ref 22–32)
Calcium: 8.9 mg/dL (ref 8.9–10.3)
Chloride: 104 mmol/L (ref 101–111)
Creatinine, Ser: 1.29 mg/dL — ABNORMAL HIGH (ref 0.61–1.24)
GFR calc Af Amer: >60 mL/min (ref >60)
GFR calc non Af Amer: >60 mL/min (ref >60)
Glucose, Bld: 105 mg/dL — ABNORMAL HIGH (ref 65–99)
Potassium: 3.5 mmol/L (ref 3.5–5.1)
Sodium: 139 mmol/L (ref 135–145)

## 2017-03-06 LAB — I-STAT TROPONIN, ED
Troponin i, poc: 0.03 ng/mL (ref 0.00–0.08)
Troponin i, poc: 0.03 ng/mL (ref 0.00–0.08)

## 2017-03-06 LAB — CREATININE, SERUM
Creatinine, Ser: 1.22 mg/dL (ref 0.61–1.24)
GFR calc Af Amer: >60 mL/min (ref >60)
GFR calc non Af Amer: >60 mL/min (ref >60)

## 2017-03-06 MED ORDER — ALISKIREN FUMARATE 150 MG PO TABS: 300.0000 mg | ORAL_TABLET | Freq: Every day | ORAL | Status: DC

## 2017-03-06 MED ORDER — ASPIRIN 81 MG PO CHEW
324.0000 mg | CHEWABLE_TABLET | Freq: Once | ORAL | Status: AC
  Administered 2017-03-06: 324 mg via ORAL
  Filled 2017-03-06: qty 4

## 2017-03-06 MED ORDER — CITALOPRAM HYDROBROMIDE 20 MG PO TABS
20.0000 mg | ORAL_TABLET | Freq: Every day | ORAL | Status: DC
  Filled 2017-03-06: qty 1

## 2017-03-06 MED ORDER — ASPIRIN EC 81 MG PO TBEC: 81.0000 mg | DELAYED_RELEASE_TABLET | Freq: Every day | ORAL | Status: DC

## 2017-03-06 MED ORDER — ASPIRIN 81 MG PO CHEW: 324.0000 mg | CHEWABLE_TABLET | ORAL | Status: DC

## 2017-03-06 MED ORDER — CLOPIDOGREL BISULFATE 75 MG PO TABS
75.0000 mg | ORAL_TABLET | Freq: Every day | ORAL | Status: DC
  Administered 2017-03-06: 75 mg via ORAL
  Filled 2017-03-06 (×2): qty 1

## 2017-03-06 MED ORDER — ALISKIREN FUMARATE 150 MG PO TABS
300.0000 mg | ORAL_TABLET | Freq: Every day | ORAL | Status: DC
  Filled 2017-03-06 (×2): qty 2

## 2017-03-06 MED ORDER — AMLODIPINE BESYLATE 10 MG PO TABS
10.0000 mg | ORAL_TABLET | Freq: Every day | ORAL | Status: DC
  Filled 2017-03-06 (×2): qty 1

## 2017-03-06 MED ORDER — ACETAMINOPHEN 325 MG PO TABS: 650.0000 mg | ORAL_TABLET | ORAL | Status: DC | PRN

## 2017-03-06 MED ORDER — HEPARIN SODIUM (PORCINE) 5000 UNIT/ML IJ SOLN
5000.0000 [IU] | Freq: Three times a day (TID) | INTRAMUSCULAR | Status: DC
  Filled 2017-03-06: qty 1

## 2017-03-06 MED ORDER — ONDANSETRON HCL 4 MG/2ML IJ SOLN: 4.0000 mg | Freq: Four times a day (QID) | INTRAMUSCULAR | Status: DC | PRN

## 2017-03-06 MED ORDER — ISOSORBIDE MONONITRATE ER 30 MG PO TB24
30.0000 mg | ORAL_TABLET | Freq: Every day | ORAL | Status: DC
  Administered 2017-03-06 – 2017-03-07 (×2): 30 mg via ORAL
  Filled 2017-03-06 (×2): qty 1

## 2017-03-06 MED ORDER — ASPIRIN 300 MG RE SUPP: 300.0000 mg | RECTAL | Status: DC

## 2017-03-06 MED ORDER — NITROGLYCERIN 0.4 MG SL SUBL: 0.4000 mg | SUBLINGUAL_TABLET | SUBLINGUAL | Status: DC | PRN

## 2017-03-06 MED ORDER — ASPIRIN EC 81 MG PO TBEC
81.0000 mg | DELAYED_RELEASE_TABLET | Freq: Every day | ORAL | Status: DC
  Filled 2017-03-06: qty 1

## 2017-03-06 MED ORDER — ATORVASTATIN CALCIUM 40 MG PO TABS
40.0000 mg | ORAL_TABLET | Freq: Every day | ORAL | Status: DC
  Administered 2017-03-06: 40 mg via ORAL
  Filled 2017-03-06 (×2): qty 1

## 2017-03-06 NOTE — H&P (Addendum)
Cardiology Admission History and Physical:   Patient ID: Geralyn FlashCasby Fiorella; MRN: [id number]; DOB: 03-08-1961   Admission date: 03/06/2017  Primary Care Provider: Clovis RileyMitchell, L.August Saucerean, MD Primary Cardiologist: Dr. Clifton JamesMcAlhany Primary Electrophysiologist:   Chief Complaint:  Chest pain  Patient Profile:   Patient is a 56 year old male with a history of ischemic cardiomyopathy, CAD status post BMS to the LAD, stent to proximal RCA, and recent (02/26/17) DES to circumflex, hypertension, hyperlipidemia, depression, anxiety, arthritis, OSA.  [facility] with ongoing chest pain.   History of Present Illness:   Geralyn FlashCasby [place] is a 56 y.o. male with a history of ischemic cardiomyopathy, CAD status post BMS to the LAD in 2008.  Heart catheterization in 2014 with patent LAD stents and 50% mid circumflex stenosis with severe disease in the RCA.  DES was placed to the RCA at that time.  Echocardiogram in 2009 with LVEF of 35-40% with periapical akinesis and anterior hypokinesis and mid left atrial enlargement.  Nuclear stress test 06/2013 with no reversible ischemia and EF of 34%.  During his last clinic visit with Dr. Clifton JamesMcAlhany on 02/01/2017, he reported dyspnea on exertion.  Exercise nuclear stress test read as high risk with large prior anterior septal and apical infarct, mild ischemia in the basal inferior wall akinesis of the anterior septal wall and apex severe left ventricular enlargement.  Given these results he was scheduled for heart catheterization.  Left and right heart catheterization on 02/26/2017 with patent LAD stent, severe stenosis in the distal circumflex s/p DES to circumflex, and patent proximal RCA stent followed by a chronic total occlusion of the RCA with left to right collaterals.  He was discharged and continued on DA PT with aspirin and Plavix.  He is intolerant to ACE secondary to cough and intolerant to ARB secondary to dizziness.  He is intolerant to beta-blockers secondary to  bradycardia.  He was continued on statin.  He really presents to [facility] with recurrence of chest pain.  He states that he awoke this morning and felt a chest discomfort in his center left chest.  This is a similar pain felt prior to his recent cath.  He took one nitro sublingual tab.  He got some relief with this nitro, but the pain returned in approximately 15 minutes.  He took a second nitro and it woke his wife to come to the ER.  He denies shortness of breath, lower extremity swelling, dizziness, feelings of syncope, recent illness, fever, chills, nausea, vomiting, diaphoresis, syncope.  He states that he may have felt some palpitations with the chest pain.  On arrival to the ER he is chest pain-free without further intervention.  He states that he thinks the chest pain is related to his stressful work situation.  He works for [employer].  Past Medical History:  Diagnosis Date  . Allergic rhinitis   . Anxiety   . Arthritis    "lower back, right thumb, knees" (10/04/2012)  . Asthma    "grew out of it" (10/04/2012)  . Coronary artery disease    a. s/p prior stent to LAD;  b. LHC 6/14: LAD stent patent, mCFX 50%, pRCA 95-99%, mRCA 75%, EF 45% with inf HK => PCI: Promus DES to pRCA  . Depression   . Hyperlipidemia   . Hypertension   . MI (myocardial infarction) (HCC) 03/2007  . OSA (obstructive sleep apnea)    mild  . Pneumonia    "as a child" (  10/04/2012)    Past Surgical History:  Procedure Laterality Date  . ARTHROPLASTY Right 1993   'crushed; removed bone fragments" (10/04/2012)  . CARDIAC CATHETERIZATION  2010  . CORONARY ANGIOPLASTY WITH STENT PLACEMENT  2008; 10/04/2012   "1 + 1" (10/04/2012)  . EXPLORATORY LAPAROTOMY  1990's?   "went in to repair hernia; found fatty tissue instead; no hernia repair" (10/04/2012)     Medications Prior to Admission: Prior to Admission medications   Medication Sig Start Date End Date Taking? Authorizing Provider  aliskiren (TEKTURNA)  300 MG tablet Take 300 mg by mouth daily with breakfast.    Yes [provider]  amLODipine (NORVASC) 10 MG tablet TAKE 1 TABLET (10 MG TOTAL) BY MOUTH DAILY. 11/12/15  Yes Kathleene Hazel, MD  aspirin EC 81 MG tablet Take 81 mg by mouth daily with breakfast.   Yes [provider]  atorvastatin (LIPITOR) 20 MG tablet Take 20 mg by mouth daily.   Yes [provider]  citalopram (CELEXA) 20 MG tablet Take 20 mg by mouth daily with breakfast.    Yes [provider]  clopidogrel (PLAVIX) 75 MG tablet TAKE 1 TABLET BY MOUTH EVERY DAY WITH BREAKFAST 10/18/15  Yes Kathleene Hazel, MD  etodolac (LODINE) 400 MG tablet Take 400 mg by mouth 2 (two) times daily as needed (FOR PAIN.).  06/22/14  Yes [provider]  finasteride (PROSCAR) 5 MG tablet Take 5 mg by mouth daily. 02/13/17  Yes [provider]  hydrochlorothiazide (HYDRODIURIL) 25 MG tablet TAKE 1 TABLET (25 MG TOTAL) BY MOUTH DAILY. 09/13/15  Yes Kathleene Hazel, MD  nitroGLYCERIN (NITROSTAT) 0.4 MG SL tablet PLACE 1 TABLET UNDER THE TONGUE EVERY 5 MINUTES AS NEEDED FOR CHEST PAIN. 09/29/16  Yes Kathleene Hazel, MD  tamsulosin (FLOMAX) 0.4 MG CAPS capsule Take 0.4 mg by mouth daily with breakfast.  01/17/17  Yes [provider]  HYDROcodone-acetaminophen (NORCO/VICODIN) 5-325 MG per tablet Take 1 tablet by mouth every 4 (four) hours as needed for pain. Patient not taking: Reported on 03/06/2017 04/11/12   Fayrene Helper, [name]     Allergies:    Allergies  Allergen Reactions  . Adhesive [Tape] Other (See Comments)    SKIN SCARRING/IRRITATION. PAPER TAPE IS OKAY    Social History:   Social History   Socioeconomic History  . Marital status: Married    Spouse name: Not on file  . Number of children: 1  . Years of education: Not on file  . Highest education level: Not on file  Social Needs  . Financial resource strain: Not on file  . Food insecurity - worry:  Not on file  . Food insecurity - inability: Not on file  . Transportation needs - medical: Not on file  . Transportation needs - non-medical: Not on file  Occupational History    Employer: [employer]  Tobacco Use  . Smoking status: Former Smoker    Packs/day: 0.50    Years: 20.00    Pack years: 10.00    Types: Cigarettes    Last attempt to quit: 12/31/1998    Years since quitting: 18.1  . Smokeless tobacco: Never Used  Substance and Sexual Activity  . Alcohol use: Yes    Alcohol/week: 0.0 oz    Comment: 10/04/2012 "1 beer plus 2 mixed drinks once/month"  . Drug use: Yes    Frequency: 14.0 times per week    Types: Marijuana    Comment: smokes 2 joints/day  .  Sexual activity: Yes  Other Topics Concern  . Not on file  Social History Narrative  . Not on file    Family History:   The patient's family history includes Atrial fibrillation in his mother; CAD in his maternal grandfather and maternal grandmother; Diabetes type II in his mother; Hypertension in his mother.    ROS:  Please see the history of present illness.  All other ROS reviewed and negative.     Physical Exam/Data:   Vitals:   03/06/17 0838 03/06/17 0855 03/06/17 0900 03/06/17 0915  BP: 115/74 126/85 118/90 125/81  Pulse: (!) 47 (!) 43 (!) 46 (!) 47  Resp: 20 12 16 19   Temp: 98.5 F (36.9 C)     TempSrc: Oral     SpO2: 100% 100% 99% 98%  Weight:      Height:       No intake or output data in the 24 hours ending 03/06/17 0958 Filed Weights   03/06/17 0519  Weight: 200 lb (90.7 kg)   Body mass index is 29.53 kg/m.  General:  Well nourished, well developed, in no acute distress HEENT: normal Neck: no JVD Vascular: No carotid bruits Cardiac:  normal S1, S2; RRR; no murmur Lungs:  clear to auscultation bilaterally, no wheezing, rhonchi or rales  Abd: soft, nontender, no hepatomegaly  Ext: no edema Musculoskeletal:  No deformities, BUE and BLE strength normal and equal Skin: warm and dry  Neuro:   CNs 2-12 intact, no focal abnormalities noted Psych:  Normal affect    EKG:  The ECG that was done 11/6/418 was personally reviewed and demonstrates anteroseptal infarct that is see on previous EKG (02/26/17).  Relevant CV Studies:  Left heart cath 02/26/17  Ost RCA to Prox RCA lesion, 40 %stenosed.  Prox RCA lesion, 60 %stenosed.  Mid RCA lesion, 100 %stenosed.  1st Mrg lesion, 30 %stenosed.  Mid Cx lesion, 75 %stenosed.  Ost LAD to Prox LAD lesion, 20 %stenosed.  1st Diag lesion, 60 %stenosed.  1. Triple vessel CAD 2. Patent stent proximal LAD with minimal in stent restenosis 3. Severe stenosis distal Circumflex artery 4. Successful PTCA/DES x 1 distal Circumflex 5. Patent proximal RCA stent followed by chronic total occlusion of RCA in the mid segment. The distal RCA fills from left to right collaterals.   Recommendations: Will continue DAPT with ASA and Plavix. Continue statin. He does not tolerate ARB,Ace-inh or beta blockers. Same day PCI discharge. Will need office follow up in 1-2 weeks.    Echocardiogram 02/16/2017: Study Conclusions - Left ventricle: The cavity size was normal. There was mild focal   basal hypertrophy of the septum. Systolic function was moderately   reduced. The estimated ejection fraction was in the range of 35%   to 40%. Global hypokinesis with apical dyskinesis. Features are   consistent with a pseudonormal left ventricular filling pattern,   with concomitant abnormal relaxation and increased filling   pressure (grade 2 diastolic dysfunction). Doppler parameters are   consistent with indeterminate ventricular filling pressure. - Aortic valve: Transvalvular velocity was within the normal range.   There was no stenosis. There was trivial regurgitation. - Left atrium: The atrium was mildly dilated. - Right ventricle: The cavity size was normal. Wall thickness was   normal. Systolic function was normal. - Tricuspid valve: There was mild  regurgitation. - Pulmonary arteries: Systolic pressure was within the normal   range. PA peak pressure: 30 mm Hg (S).   Heart Cath 10/04/12: Final Conclusions:  1. Severe diffuse proximal and mid RCA stenosis treated successfully with PCI/DES 2. Continued patency of the stented segment in the LAD 3. Moderate segmental LV systolic dysfunction   Recommendations:  Dual antiplatelet therapy with aspirin and Plavix for at least one year. Aggressive risk reduction as the patient has diffuse coronary plaque.   Heart Cath 05/07/08 CONCLUSION:  1. Continued patency of the LAD at the prior stent site.  2. Diffuse luminal irregularities of the coronaries with abnormalities      in the first diagonal, the first marginal, the AV circumflex and      diffuse plaquing of the mid right coronary.   RECOMMENDATIONS:  At the present time, we will recommend continued  medical therapy.  I will review this with the patient in the office in  detail.  I plan to review the films with my colleagues as well.   Heart Cath 03/25/07 CONCLUSIONS:  1. Acute anterior wall myocardial infarction treated with primary      reperfusion therapy with total occlusion of the LAD just at the      diagonal with successful reperfusion.  2. Moderate reduction of left ventricular function with an      anteroapical and distal inferior wall motion abnormality.  3. Hypertension not well controlled.   PLAN:  The patient will be moved to the CCU.  Cardiac rehab will be  obtained.  Aspirin, Plavix, beta blockade, ACE inhibitors, and  subsequent statins will be administered.  Risk factor reduction will be  encouraged.  Followup in the clinic will be with Dr. Riley Kill.     Laboratory Data:  Chemistry Recent Labs  Lab 03/06/17 0539  NA 139  K 3.5  CL 104  CO2 28  GLUCOSE 105*  BUN 14  CREATININE 1.29*  CALCIUM 8.9  GFRNONAA >60  GFRAA >60  ANIONGAP 7    No results for input(s): PROT, ALBUMIN, AST, ALT, ALKPHOS,  BILITOT in the last 168 hours. Hematology Recent Labs  Lab 03/06/17 0539  WBC 5.8  RBC 5.58  HGB 12.6*  HCT 40.9  MCV 73.3*  MCH 22.6*  MCHC 30.8  RDW 15.7*  PLT 204   Cardiac EnzymesNo results for input(s): TROPONINI in the last 168 hours.  Recent Labs  Lab 03/06/17 0525  TROPIPOC 0.03    BNPNo results for input(s): BNP, PROBNP in the last 168 hours.  DDimer No results for input(s): DDIMER in the last 168 hours.  Lipid Panel     Component Value Date/Time   CHOL 136 09/03/2012 0951   TRIG 95.0 09/03/2012 0951   HDL 43.20 09/03/2012 0951   CHOLHDL 3 09/03/2012 0951   VLDL 19.0 09/03/2012 0951   LDLCALC 74 09/03/2012 0951   LDLDIRECT 59.9 06/03/2007 1114   Radiology/Studies:  Dg Chest 2 View  Result Date: 03/06/2017 CLINICAL DATA:  Chest pain and discomfort for couple of hours. Heart stent placed 1 week ago. History of hypertension. Smoker. EXAM: CHEST  2 VIEW COMPARISON:  05/27/2013 FINDINGS: Normal heart size and pulmonary vascularity. No focal airspace disease or consolidation in the lungs. No blunting of costophrenic angles. No pneumothorax. Mediastinal contours appear intact. Calcified lymph nodes in the left hilum and left mediastinum. Calcified granuloma in the left lung. Coronary artery stents. IMPRESSION: No evidence of active pulmonary disease. Calcified granulomas on the left. Electronically Signed   By: Burman Nieves M.D.   On: 03/06/2017 06:12    Assessment and Plan:    1. Chest pain Mr. Caswell Corwin has  known coronary artery disease and had a recent heart catheterization with DES to the circumflex.  He returns today with recurrence of chest discomfort.  EKG has not changed and initial troponin has been negative.  He is compliant on all medications.  Would recommend admission for observation, trend troponins.  Will discuss with attending need for further intervention.  If troponins remain negative no further testing required.  2. HTN - continue norvasc - pt  intolerant to ACEI/ARB and BB   3. HLD - continue statin - Contin ASA and plavix   Severity of Illness: The appropriate patient status for this patient is OBSERVATION. Observation status is judged to be reasonable and necessary in order to provide the required intensity of service to ensure the patient's safety. The patient's presenting symptoms, physical exam findings, and initial radiographic and laboratory data in the context of their medical condition is felt to place them at decreased risk for further clinical deterioration. Furthermore, it is anticipated that the patient will be medically stable for discharge from the hospital within 2 midnights of admission. The following factors support the patient status of observation.   " The patient's presenting symptoms include chest pain. " The physical exam findings include WNL. " The initial radiographic and laboratory data are WNL     For questions or updates, please contact [facility] Please consult [email or web address] for contact info under Cardiology/STEMI.    Signed, Marcelino Duster, [place]  03/06/2017 9:58 AM    Patient seen and examined. Agree with assessment and plan. Mr. 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  In 2008.  He underwent bare-metal stenting to his LAD.  In 2014 DES stent was placed in the proximal RCA.  He has an ischemic cardiomyopathy.  On 02/26/2017 repeat catheterization for recurrent chest pain symptomatology revealed a patent LAD stent with minimal intimal hyperplasia, a pain proximal RCA stent followed by chronic total occlusion of the distal RCA with left-to-right collaterals, and he had a 75% distal circumflex stenosis which was stented with a DES stent.  This morning he woke early from sleep and noticed a sensation in his chest.  He is uncertain if he was awakened  from this discomfort or developed shortly after awakening.  Ultimately, the chest pain persisted, and ultimately resolved after his second sublingual nitroglycerin.  Due to concerns of recurrent ischemic symptoms he presented to the emergency room.  At present, he is not having recurrent symptoms.  He is bradycardic with resting pulse in the upper 40s not on any AV nodal blocking medications.  There is apparent intolerance to ACE-I/ARB but he is on direct renin inhibition with Aliskiren.  PE is notable for blood pressure of 150/90, pulse 47.  HEENT is unremarkable.  There is nobody scale of 3.  Neck is thick.  There are no carotid bruits.  Lungs were clear.  Rhythm was regular with a faint 1/6 systolic murmur.  Abdomen was soft, nontender.  Pulses were adequate.  Distal pulses are 2+.  Neurologic exam is grossly nonfocal.  ECG shows sinus rhythm with QS complex in V1 through V4 consistent with old anteroseptal MI. It is certainly possible that in the early morning he may have had vasospasm which limited his left-to-right collateral supplying his distally occluded RCA causing recurrent symptomatology.  He already is on amlodipine.  I will add isosorbide mononitrate, initially at 30 mg and  possibly this can be further titrated.  In addition, he may  be a candidate for Ranexa at 500 mg twice a day initially with titration up to 1000 mg twice a day, but I will not start this today. He states his chest pain seemed to occur in the very early morning hours.   Remotely, he was diagnosed with obstructive sleep apnea but has not been on therapy for a long time.  It is certainly possible that he developed early morning oxygen desaturation in REM sleep, which was untreated and typically during REM sleep apnea is more severe contributing to his to early morning ischemia/vasospasm.  I discussed with him the significant improvement in CPAP technologies and  masks and it is my recommendation that he be reevaluated as an outpatient  for this as well.  We will minimal overnight for further observation, troponin, assessment, hemodynamic monitoring, and oxygen saturation.  Further titrate atorvastatin to 40 mg for high potency dosing, and recheck lipid panel in a.m.  If LDL is less than 70 will further titrate to 80 mg and possibly add Zetia.   Lennette Bihari, MD, [facility] 03/06/2017 1:35 PM

## 2017-03-06 NOTE — ED Notes (Signed)
Phlebotomy at the bedside  

## 2017-03-06 NOTE — Progress Notes (Signed)
CRITICAL VALUE ALERT  Critical Value:  Troponin 0.05  Date & Time Notied:  03/06/2017 2020  Provider Notified:  Dr. Cristina Gong  Orders Received/Actions taken: Keep NPO after midnight

## 2017-03-06 NOTE — ED Notes (Signed)
Attempted Report x1.   

## 2017-03-06 NOTE — ED Triage Notes (Signed)
States he got up at 4 am was sitting on the  [place] drinking coffe and thought he had "gas" states the pain got a little more intense, states he took a NTG with relief. C/o sob  Denies n/v.  States he is currently pain free. States he had a stent place 1 week ago.

## 2017-03-06 NOTE — ED Notes (Signed)
Cardiology at the bedside.

## 2017-03-06 NOTE — ED Provider Notes (Signed)
[facility] EMERGENCY DEPARTMENT Provider Note   CSN: [id number] Arrival date & time: 03/06/17  0509     History   Chief Complaint Chief Complaint  Patient presents with  . Chest Pain    HPI Austin Ingram is a 56 y.o. male.  56 year old male with past medical history including CAD s/p MI and recent stent, HTN, HLD, depression who p/w chest pain.  This morning the patient woke up around 4am and was drinking coffee when he began having central chest pain that he thought was gas pains initially.  He describes it as an indigestion feeling in his chest.  Initially he felt short of breath with it and he developed clammy hands.  No associated nausea or vomiting.  The pain intensified and he eventually took a nitroglycerin, followed later by a second dose of nitroglycerin.  The pain persisted for a total of 2-2.5 hours and then resolved.  Currently he is symptom-free.  He had a heart catheterization on 10/29 with stent placement and has been doing well since then with no episodes of chest pain.  He states that the feeling today was similar to MI 10 years ago.  He denies any cocaine use.  He is compliant with medications.   The history is provided by the patient.  Chest Pain      Past Medical History:  Diagnosis Date  . Allergic rhinitis   . Anxiety   . Arthritis    "lower back, right thumb, knees" (10/04/2012)  . Asthma    "grew out of it" (10/04/2012)  . Coronary artery disease    a. s/p prior stent to LAD;  b. LHC 6/14: LAD stent patent, mCFX 50%, pRCA 95-99%, mRCA 75%, EF 45% with inf HK => PCI: Promus DES to pRCA  . Depression   . Hyperlipidemia   . Hypertension   . MI (myocardial infarction) (HCC) 03/2007  . OSA (obstructive sleep apnea)    mild  . Pneumonia    "as a child" (10/04/2012)    Patient Active Problem List   Diagnosis Date Noted  . Unstable angina (HCC)   . Ischemic cardiomyopathy 02/20/2017  . Exertional angina (HCC) 10/05/2012  . Coronary artery  disease 12/19/2010  . CIRCADIAN RHYTHM SLEEP DISORDER SHIFT WORK TYPE 03/10/2010  . INADEQUATE SLEEP HYGIENE 08/20/2007  . OBSTRUCTIVE SLEEP APNEA 08/20/2007  . ALLERGIC RHINITIS 08/20/2007  . ASTHMA 08/20/2007  . Hyperlipidemia 08/19/2007  . Essential hypertension 08/19/2007  . MYOCARDIAL INFARCTION 08/19/2007  . CORONARY HEART DISEASE 08/19/2007    Past Surgical History:  Procedure Laterality Date  . ARTHROPLASTY Right 1993   'crushed; removed bone fragments" (10/04/2012)  . CARDIAC CATHETERIZATION  2010  . CORONARY ANGIOPLASTY WITH STENT PLACEMENT  2008; 10/04/2012   "1 + 1" (10/04/2012)  . EXPLORATORY LAPAROTOMY  1990's?   "went in to repair hernia; found fatty tissue instead; no hernia repair" (10/04/2012)       Home Medications    Prior to Admission medications   Medication Sig Start Date End Date Taking? Authorizing Provider  aliskiren (TEKTURNA) 300 MG tablet Take 300 mg by mouth daily with breakfast.     [provider]  amLODipine (NORVASC) 10 MG tablet TAKE 1 TABLET (10 MG TOTAL) BY MOUTH DAILY. 11/12/15   Kathleene Hazel, MD  aspirin EC 81 MG tablet Take 81 mg by mouth daily with breakfast.    [provider]  atorvastatin (LIPITOR) 20 MG tablet Take 20 mg by mouth daily.  [provider]  citalopram (CELEXA) 20 MG tablet Take 20 mg by mouth daily with breakfast.     [provider]  clopidogrel (PLAVIX) 75 MG tablet TAKE 1 TABLET BY MOUTH EVERY DAY WITH BREAKFAST 10/18/15   Kathleene HazelMcAlhany, Christopher D, MD  etodolac (LODINE) 400 MG tablet Take 400 mg by mouth 2 (two) times daily as needed (FOR PAIN.).  06/22/14   [provider]  finasteride (PROSCAR) 5 MG tablet Take 5 mg by mouth daily. 02/13/17   [provider]  hydrochlorothiazide (HYDRODIURIL) 25 MG tablet TAKE 1 TABLET (25 MG TOTAL) BY MOUTH DAILY. 09/13/15   Kathleene HazelMcAlhany, Christopher D, MD  HYDROcodone-acetaminophen (NORCO/VICODIN) 5-325 MG per tablet Take 1 tablet  by mouth every 4 (four) hours as needed for pain. 04/11/12   Fayrene Helperran, Bowie, [name]  nitroGLYCERIN (NITROSTAT) 0.4 MG SL tablet PLACE 1 TABLET UNDER THE TONGUE EVERY 5 MINUTES AS NEEDED FOR CHEST PAIN. 09/29/16   Kathleene HazelMcAlhany, Christopher D, MD  tamsulosin (FLOMAX) 0.4 MG CAPS capsule Take 0.4 mg by mouth daily with breakfast.  01/17/17   [provider]    Family History Family History  Problem Relation Age of Onset  . Atrial fibrillation Mother        irregular heart beats  . Hypertension Mother   . Diabetes type II Mother   . CAD Maternal Grandfather   . CAD Maternal Grandmother     Social History Social History   Tobacco Use  . Smoking status: Former Smoker    Packs/day: 0.50    Years: 20.00    Pack years: 10.00    Types: Cigarettes    Last attempt to quit: 12/31/1998    Years since quitting: 18.1  . Smokeless tobacco: Never Used  Substance Use Topics  . Alcohol use: Yes    Alcohol/week: 0.0 oz    Comment: 10/04/2012 "1 beer plus 2 mixed drinks once/month"  . Drug use: Yes    Frequency: 14.0 times per week    Types: Marijuana    Comment: smokes 2 joints/day     Allergies   Adhesive [tape]   Review of Systems Review of Systems  Cardiovascular: Positive for chest pain.   All other systems reviewed and are negative except that which was mentioned in HPI   Physical Exam Updated Vital Signs BP 126/85 (BP Location: Right Arm)   Pulse (!) 43   Temp 98.5 F (36.9 C) (Oral)   Resp 12   Ht 5\' 9"  (1.753 m)   Wt 90.7 kg (200 lb)   SpO2 100%   BMI 29.53 kg/m   Physical Exam  Constitutional: He is oriented to person, place, and time. He appears well-developed and well-nourished. No distress.  HENT:  Head: Normocephalic and atraumatic.  Moist mucous membranes  Eyes: Conjunctivae are normal. Pupils are equal, round, and reactive to light.  Neck: Neck supple.  Cardiovascular: Normal rate, regular rhythm and normal heart sounds.  No murmur heard. Pulmonary/Chest:  Effort normal and breath sounds normal.  Abdominal: Soft. Bowel sounds are normal. He exhibits no distension. There is no tenderness.  Musculoskeletal: He exhibits no edema.  Neurological: He is alert and oriented to person, place, and time.  Fluent speech  Skin: Skin is warm and dry.  Psychiatric: He has a normal mood and affect. Judgment normal.  Nursing note and vitals reviewed.    ED Treatments / Results  Labs (all labs ordered are listed, but only abnormal results are displayed) Labs Reviewed  BASIC METABOLIC  PANEL - Abnormal; Notable for the following components:      Result Value   Glucose, Bld 105 (*)    Creatinine, Ser 1.29 (*)    All other components within normal limits  CBC - Abnormal; Notable for the following components:   Hemoglobin 12.6 (*)    MCV 73.3 (*)    MCH 22.6 (*)    RDW 15.7 (*)    All other components within normal limits  LIPID PANEL  I-STAT TROPONIN, ED  I-STAT TROPONIN, ED    EKG  EKG Interpretation  Date/Time:  Tuesday March 06 2017 05:14:26 EST Ventricular Rate:  65 PR Interval:  196 QRS Duration: 80 QT Interval:  396 QTC Calculation: 411 R Axis:   45 Text Interpretation:  Sinus rhythm with occasional Premature ventricular complexes Anteroseptal infarct , age undetermined Abnormal ECG No significant change since last tracing Confirmed by Ross Marcus ([id number]) on 03/06/2017 5:27:30 AM Also confirmed by Ross Marcus ([id number]), editor Madalyn Rob [phone])  on 03/06/2017 7:03:57 AM       Radiology Dg Chest 2 View  Result Date: 03/06/2017 CLINICAL DATA:  Chest pain and discomfort for couple of hours. Heart stent placed 1 week ago. History of hypertension. Smoker. EXAM: CHEST  2 VIEW COMPARISON:  05/27/2013 FINDINGS: Normal heart size and pulmonary vascularity. No focal airspace disease or consolidation in the lungs. No blunting of costophrenic angles. No pneumothorax. Mediastinal contours appear intact. Calcified lymph nodes in the  left hilum and left mediastinum. Calcified granuloma in the left lung. Coronary artery stents. IMPRESSION: No evidence of active pulmonary disease. Calcified granulomas on the left. Electronically Signed   By: Burman Nieves M.D.   On: 03/06/2017 06:12    Procedures Procedures (including critical care time)  Medications Ordered in ED Medications - No data to display   Initial Impression / Assessment and Plan / ED Course  I have reviewed the triage vital signs and the nursing notes.  Pertinent labs & imaging results that were available during my care of the patient were reviewed by me and considered in my medical decision making (see chart for details).     Pt w/ h/o CAD, heart cath with stent to circumflex last week, p/w 2h episode of chest pain, now resolved. He was comfortable on exam with reassuring VS. No ischemia on EKG. Gave ASA. CXR negative acute and trop negative. Given the length of time the CP lasted and his recent stenting, contacted cardiology for evaluation. Pt evaluated by PA [facility] and Dr. Tresa Endo. They will admit for obs and further evaluation.  Final Clinical Impressions(s) / ED Diagnoses   Final diagnoses:  Chest pain, unspecified type    ED Discharge Orders    None       Mizael Sagar, Ambrose Finland, MD 03/06/17 1441

## 2017-03-07 DIAGNOSIS — E785 Hyperlipidemia, unspecified: Secondary | ICD-10-CM | POA: Diagnosis not present

## 2017-03-07 DIAGNOSIS — I2510 Atherosclerotic heart disease of native coronary artery without angina pectoris: Secondary | ICD-10-CM | POA: Diagnosis not present

## 2017-03-07 DIAGNOSIS — R079 Chest pain, unspecified: Secondary | ICD-10-CM | POA: Diagnosis not present

## 2017-03-07 DIAGNOSIS — I10 Essential (primary) hypertension: Secondary | ICD-10-CM | POA: Diagnosis not present

## 2017-03-07 DIAGNOSIS — I25118 Atherosclerotic heart disease of native coronary artery with other forms of angina pectoris: Secondary | ICD-10-CM | POA: Diagnosis not present

## 2017-03-07 DIAGNOSIS — J45909 Unspecified asthma, uncomplicated: Secondary | ICD-10-CM | POA: Diagnosis not present

## 2017-03-07 LAB — BASIC METABOLIC PANEL
Anion gap: 6 (ref 5–15)
BUN: 18 mg/dL (ref 6–20)
CO2: 27 mmol/L (ref 22–32)
Calcium: 8.4 mg/dL — ABNORMAL LOW (ref 8.9–10.3)
Chloride: 107 mmol/L (ref 101–111)
Creatinine, Ser: 1.16 mg/dL (ref 0.61–1.24)
GFR calc Af Amer: >60 mL/min (ref >60)
GFR calc non Af Amer: >60 mL/min (ref >60)
Glucose, Bld: 89 mg/dL (ref 65–99)
Potassium: 3.8 mmol/L (ref 3.5–5.1)
Sodium: 140 mmol/L (ref 135–145)

## 2017-03-07 LAB — CBC
HCT: 38.4 % — ABNORMAL LOW (ref 39.0–52.0)
Hemoglobin: 12.1 g/dL — ABNORMAL LOW (ref 13.0–17.0)
MCH: 23 pg — ABNORMAL LOW (ref 26.0–34.0)
MCHC: 31.5 g/dL (ref 30.0–36.0)
MCV: 73 fL — ABNORMAL LOW (ref 78.0–100.0)
Platelets: 183 10*3/uL (ref 150–400)
RBC: 5.26 MIL/uL (ref 4.22–5.81)
RDW: 15.3 % (ref 11.5–15.5)
WBC: 5.9 10*3/uL (ref 4.0–10.5)

## 2017-03-07 LAB — HIV ANTIBODY (ROUTINE TESTING W REFLEX): HIV Screen 4th Generation wRfx: NONREACTIVE

## 2017-03-07 LAB — TROPONIN I
Troponin I: 0.05 ng/mL (ref <0.03)
Troponin I: 0.04 ng/mL (ref <0.03)
Troponin I: 0.04 ng/mL (ref <0.03)

## 2017-03-07 LAB — HEMOGLOBIN A1C
Hgb A1c MFr Bld: 6.2 % — ABNORMAL HIGH (ref 4.8–5.6)
Mean Plasma Glucose: 131.24 mg/dL

## 2017-03-07 MED ORDER — ISOSORBIDE MONONITRATE ER 30 MG PO TB24: 30.0000 mg | ORAL_TABLET | Freq: Every day | ORAL | 2 refills | Status: DC

## 2017-03-07 MED ORDER — ATORVASTATIN CALCIUM 40 MG PO TABS: 40.0000 mg | ORAL_TABLET | Freq: Every day | ORAL | 2 refills | Status: DC

## 2017-03-07 NOTE — Plan of Care (Signed)
Pt denies any pain throughout shift. VSS. Will continue to monitor.

## 2017-03-07 NOTE — Discharge Summary (Signed)
Discharge Summary    Patient ID: Austin Ingram,  MRN: [id number], DOB/AGE: 01/23/1961 56 y.o.  Admit date: 03/06/2017 Discharge date: 03/07/2017  Primary Care Provider: Clovis RileyMitchell, L.Dean Primary Cardiologist: Clifton JamesMcAlhany   Discharge Diagnoses    Active Problems:   OSA (obstructive sleep apnea)   Chest pain   Allergies Allergies  Allergen Reactions  . Adhesive [Tape] Other (See Comments)    SKIN SCARRING/IRRITATION. PAPER TAPE IS OKAY    Diagnostic Studies/Procedures    N/a  _____________   History of Present Illness     Austin FlashCasby Rahimi is a 56 y.o. male with a history of ischemic cardiomyopathy, CAD status post BMS to the LAD in 2008.  Heart catheterization in 2014 with patent LAD stents and 50% mid circumflex stenosis with severe disease in the RCA.  DES was placed to the RCA at that time.  Echocardiogram in 2009 with LVEF of 35-40% with periapical akinesis and anterior hypokinesis and mid left atrial enlargement.  Nuclear stress test 06/2013 with no reversible ischemia and EF of 34%.  During his last clinic visit with Dr. Clifton JamesMcAlhany on 02/01/2017, he reported dyspnea on exertion.  Exercise nuclear stress test read as high risk with large prior anterior septal and apical infarct, mild ischemia in the basal inferior wall akinesis of the anterior septal wall and apex severe left ventricular enlargement.  Given these results he was scheduled for heart catheterization.  Left and right heart catheterization on 02/26/2017 with patent LAD stent, severe stenosis in the distal circumflex s/p DES to circumflex, and patent proximal RCA stent followed by a chronic total occlusion of the RCA with left to right collaterals.  He was discharged and continued on DA PT with aspirin and Plavix.  He is intolerant to ACE secondary to cough and intolerant to ARB secondary to dizziness.  He is intolerant to beta-blockers secondary to bradycardia.  He was continued on statin.  [facility] with recurrence of chest pain.  He stated that he awoke this morning and felt a chest discomfort in his center left chest.  This was similar pain felt prior to his recent cath.  He took one nitro sublingual tab.  He got some relief with this nitro, but the pain returned in approximately 15 minutes.  He took a second nitro and it woke his wife to come to the ER.  He denied shortness of breath, lower extremity swelling, dizziness, feelings of syncope, recent illness, fever, chills, nausea, vomiting, diaphoresis, syncope.  He stated that he may have felt some palpitations with the chest pain.  On arrival to the ER he is chest pain-free without further intervention.  He stated that he felt the chest pain was related to his stressful work situation.  He works for the [employer]. He was admitted overnight for observation.   Hospital Course     Trop 0.05>>0.04>>0.04, low flat trend. Added low dose Imdur, along with increased statin to 40mg  daily. No further chest pain. Felt well the following morning. Able to ambulate without difficulty.   General: Well developed, well nourished, male appearing in no acute distress. Head: Normocephalic, atraumatic.  Neck: Supple without bruits, JVD. Lungs:  Resp regular and unlabored, CTA. Heart: RRR, S1, S2, no S3, S4, or murmur; no rub. Abdomen: Soft, non-tender, non-distended with normoactive bowel sounds. No hepatomegaly. No rebound/guarding. No obvious abdominal masses. Extremities: No clubbing, cyanosis, edema. Distal pedal pulses are 2+ bilaterally. Neuro: Alert and oriented X 3.  Moves all extremities spontaneously. Psych: Normal affect.  Jehu Mick was seen by Dr. Tresa Endo and determined stable for discharge home. Follow up in the office has been arranged. Medications are listed below.   _____________  Discharge Vitals Blood pressure 128/80, pulse (!) 55, temperature 98.7 F (37.1 C), temperature source Oral, resp. rate 18, height 5\' 9"  (1.753 m),  weight 198 lb 9.6 oz (90.1 kg), SpO2 100 %.  Filed Weights   03/06/17 0519 03/06/17 1705 03/07/17 0535  Weight: 200 lb (90.7 kg) 201 lb 6.4 oz (91.4 kg) 198 lb 9.6 oz (90.1 kg)    Labs & Radiologic Studies    CBC Recent Labs    03/06/17 1834 03/07/17 0609  WBC 5.9 5.9  HGB 12.3* 12.1*  HCT 39.2 38.4*  MCV 72.9* 73.0*  PLT 183 183   Basic Metabolic Panel Recent Labs    17/40/81 0539 03/06/17 1834 03/07/17 0609  NA 139  --  140  K 3.5  --  3.8  CL 104  --  107  CO2 28  --  27  GLUCOSE 105*  --  89  BUN 14  --  18  CREATININE 1.29* 1.22 1.16  CALCIUM 8.9  --  8.4*   Liver Function Tests No results for input(s): AST, ALT, ALKPHOS, BILITOT, PROT, ALBUMIN in the last 72 hours. No results for input(s): LIPASE, AMYLASE in the last 72 hours. Cardiac Enzymes Recent Labs    03/06/17 1834 03/06/17 2258 03/07/17 0609  TROPONINI 0.05* 0.04* 0.04*   BNP Invalid input(s): POCBNP D-Dimer No results for input(s): DDIMER in the last 72 hours. Hemoglobin A1C Recent Labs    03/06/17 1834  HGBA1C 6.2*   Fasting Lipid Panel Recent Labs    03/06/17 0525  CHOL 135  HDL 41  LDLCALC 77  TRIG 86  CHOLHDL 3.3   Thyroid Function Tests No results for input(s): TSH, T4TOTAL, T3FREE, THYROIDAB in the last 72 hours.  Invalid input(s): FREET3 _____________  Dg Chest 2 View  Result Date: 03/06/2017 CLINICAL DATA:  Chest pain and discomfort for couple of hours. Heart stent placed 1 week ago. History of hypertension. Smoker. EXAM: CHEST  2 VIEW COMPARISON:  05/27/2013 FINDINGS: Normal heart size and pulmonary vascularity. No focal airspace disease or consolidation in the lungs. No blunting of costophrenic angles. No pneumothorax. Mediastinal contours appear intact. Calcified lymph nodes in the left hilum and left mediastinum. Calcified granuloma in the left lung. Coronary artery stents. IMPRESSION: No evidence of active pulmonary disease. Calcified granulomas on the left.  Electronically Signed   By: Burman Nieves M.D.   On: 03/06/2017 06:12   Disposition   Pt is being discharged home today in good condition.  Follow-up Plans & Appointments    Follow-up Information    Kathleene Hazel, MD Follow up on 03/09/2017.   Specialty:  Cardiology Why:  at 10:40am for your follow up appt.  Contact information: [address] [place] [address] [phone]          Discharge Instructions    Diet - low sodium heart healthy   Complete by:  As directed    Discharge instructions   Complete by:  As directed    PLEASE DO NOT MISS ANY DOSES OF YOUR PLAVIX!!!!! Also keep a log of you blood pressures and bring back to your follow up appt. Please call the office with any questions.   Patients taking blood thinners should generally stay away from medicines like ibuprofen,  Advil, Motrin, naproxen, and Aleve due to risk of stomach bleeding. You may take Tylenol as directed or talk to your primary doctor about alternatives.   Increase activity slowly   Complete by:  As directed       Discharge Medications     Medication List    STOP taking these medications   HYDROcodone-acetaminophen 5-325 MG tablet Commonly known as:  NORCO/VICODIN     TAKE these medications   aliskiren 300 MG tablet Commonly known as:  TEKTURNA Take 300 mg by mouth daily with breakfast.   amLODipine 10 MG tablet Commonly known as:  NORVASC TAKE 1 TABLET (10 MG TOTAL) BY MOUTH DAILY.   aspirin EC 81 MG tablet Take 81 mg by mouth daily with breakfast.   atorvastatin 40 MG tablet Commonly known as:  LIPITOR Take 1 tablet (40 mg total) daily by mouth. What changed:    medication strength  how much to take   citalopram 20 MG tablet Commonly known as:  CELEXA Take 20 mg by mouth daily with breakfast.   clopidogrel 75 MG tablet Commonly known as:  PLAVIX TAKE 1 TABLET BY MOUTH EVERY DAY WITH BREAKFAST   etodolac 400 MG tablet Commonly known as:   LODINE Take 400 mg by mouth 2 (two) times daily as needed (FOR PAIN.).   finasteride 5 MG tablet Commonly known as:  PROSCAR Take 5 mg by mouth daily.   hydrochlorothiazide 25 MG tablet Commonly known as:  HYDRODIURIL TAKE 1 TABLET (25 MG TOTAL) BY MOUTH DAILY.   isosorbide mononitrate 30 MG 24 hr tablet Commonly known as:  IMDUR Take 1 tablet (30 mg total) daily by mouth.   nitroGLYCERIN 0.4 MG SL tablet Commonly known as:  NITROSTAT PLACE 1 TABLET UNDER THE TONGUE EVERY 5 MINUTES AS NEEDED FOR CHEST PAIN.   tamsulosin 0.4 MG Caps capsule Commonly known as:  FLOMAX Take 0.4 mg by mouth daily with breakfast.         Outstanding Labs/Studies   N/a   Duration of Discharge Encounter   Greater than 30 minutes including physician time.  Signed, Laverda Page NP-C 03/07/2017, 9:45 AM    Patient seen and examined. Agree with assessment and plan. Feels well. No recurrent chest pain. Tolerating initiation of imdur.  If recurrent symptoms consider initiation of Ranexa. Pt has a f/u scheduled with Dr. Sanjuana Kava this Friday. No anemia but  ? etiology of microcytosis; consider outpatient assessment with iron studies ? Hb electrophoresis. Target LDL < 70; atorvastatin was increased. HbA1c 6.2.   Lennette Bihari, MD, [facility] 03/07/2017 9:51 AM

## 2017-03-07 NOTE — Progress Notes (Signed)
Patient received discharge instructions with wife at bedside. Started on Imdur release tablet, provided handout. Peripheral IV removed. Patient denies any chest pain. Patient had no further questions and verbalized understanding.

## 2017-03-08 NOTE — Progress Notes (Signed)
Chief Complaint  Patient presents with  . Coronary Artery Disease     History of Present Illness: 56 yo male with history of CAD, ischemic cardiomyopathy, HTN, HLD who is here today for cardiac follow up. He had a bare metal stent placed in the LAD in 2008. Last cardiac cath in June 2014 and he had patent LAD stents, 50% mid Circumflex stenosis and severe disease in the RCA. A drug eluting stent was placed in the proximal RCA. Last echo 2009 with LVEF=35-40%, periapical AK, ant HK, mild LAE. He did not tolerate Ace-inhibitors due to cough. He is not on a beta blocker due to bradycardia. He did not tolerate ARB secondary to dizziness. I saw him 06/05/13 and he c/o SOB and sharp left sided chest pains as well as fatigue. I arranged a stress myoview on 06/24/13 which did not show ischemia, LVEF=34%. I saw him on 02/01/17 and he had c/o chest pain and dyspnea on exertion. Nuclear stress test 02/16/17 with scar in the anteroseptal and apical walls with ischemia noted. Cardiac cath 02/26/17 with chronic occlusion RCA, patent proximal LAD stent and severe stenosis distal Circumflex artery. A drug eluting stent was placed in the distal Circumflex artery. He was discharged on ASA and Plavix. Readmitted 03/06/17 with chest pain and ruled out for MI with serial cardiac enzymes. Imdur was added.    He is here today for follow up. The patient denies any dyspnea, palpitations, lower extremity edema, orthopnea, PND, dizziness, near syncope or syncope. He is feeling better and wants to go back to work. Mild chest pain at times. Stopped Imdur due to headache.   Primary Care Physician: Clovis Riley, L.[name], MD   Past Medical History:  Diagnosis Date  . Allergic rhinitis   . Anxiety   . Arthritis    "lower back, right thumb, knees" (10/04/2012)  . Asthma    "grew out of it" (10/04/2012)  . Coronary artery disease    a. s/p prior stent to LAD;  b. LHC 6/14: LAD stent patent, mCFX 50%, pRCA 95-99%, mRCA 75%, EF 45% with  inf HK => PCI: Promus DES to pRCA  . Depression   . Hyperlipidemia   . Hypertension   . MI (myocardial infarction) (HCC) 03/2007  . OSA (obstructive sleep apnea)    mild  . Pneumonia    "as a child" (10/04/2012)    Past Surgical History:  Procedure Laterality Date  . ARTHROPLASTY Right 1993   'crushed; removed bone fragments" (10/04/2012)  . CARDIAC CATHETERIZATION  2010  . CORONARY ANGIOPLASTY WITH STENT PLACEMENT  2008; 10/04/2012   "1 + 1" (10/04/2012)  . EXPLORATORY LAPAROTOMY  1990's?   "went in to repair hernia; found fatty tissue instead; no hernia repair" (10/04/2012)    Current Outpatient Medications  Medication Sig Dispense Refill  . aliskiren (TEKTURNA) 300 MG tablet Take 300 mg by mouth daily with breakfast.     . amLODipine (NORVASC) 10 MG tablet TAKE 1 TABLET (10 MG TOTAL) BY MOUTH DAILY. 90 tablet 2  . aspirin EC 81 MG tablet Take 81 mg by mouth daily with breakfast.    . atorvastatin (LIPITOR) 40 MG tablet Take 1 tablet (40 mg total) daily by mouth. 30 tablet 2  . citalopram (CELEXA) 20 MG tablet Take 20 mg by mouth daily with breakfast.     . clopidogrel (PLAVIX) 75 MG tablet TAKE 1 TABLET BY MOUTH EVERY DAY WITH BREAKFAST 90 tablet 2  . etodolac (LODINE) 400 MG tablet  Take 400 mg by mouth 2 (two) times daily as needed (FOR PAIN.).   0  . finasteride (PROSCAR) 5 MG tablet Take 5 mg by mouth daily.    . hydrochlorothiazide (HYDRODIURIL) 25 MG tablet TAKE 1 TABLET (25 MG TOTAL) BY MOUTH DAILY. 30 tablet 10  . isosorbide mononitrate (IMDUR) 30 MG 24 hr tablet Take 1 tablet (30 mg total) daily by mouth. 30 tablet 2  . nitroGLYCERIN (NITROSTAT) 0.4 MG SL tablet PLACE 1 TABLET UNDER THE TONGUE EVERY 5 MINUTES AS NEEDED FOR CHEST PAIN. 25 tablet 1  . tamsulosin (FLOMAX) 0.4 MG CAPS capsule Take 0.4 mg by mouth daily with breakfast.      No current facility-administered medications for this visit.     Allergies  Allergen Reactions  . Adhesive [Tape] Other (See Comments)     SKIN SCARRING/IRRITATION. PAPER TAPE IS OKAY    Social History   Socioeconomic History  . Marital status: Married    Spouse name: Not on file  . Number of children: 1  . Years of education: Not on file  . Highest education level: Not on file  Social Needs  . Financial resource strain: Not on file  . Food insecurity - worry: Not on file  . Food insecurity - inability: Not on file  . Transportation needs - medical: Not on file  . Transportation needs - non-medical: Not on file  Occupational History    Employer: [employer]  Tobacco Use  . Smoking status: Former Smoker    Packs/day: 0.50    Years: 20.00    Pack years: 10.00    Types: Cigarettes    Last attempt to quit: 12/31/1998    Years since quitting: 18.2  . Smokeless tobacco: Never Used  Substance and Sexual Activity  . Alcohol use: Yes    Alcohol/week: 0.0 oz    Comment: 10/04/2012 "1 beer plus 2 mixed drinks once/month"  . Drug use: Yes    Frequency: 14.0 times per week    Types: Marijuana    Comment: smokes 2 joints/day  . Sexual activity: Yes  Other Topics Concern  . Not on file  Social History Narrative  . Not on file    Family History  Problem Relation Age of Onset  . Atrial fibrillation Mother        irregular heart beats  . Hypertension Mother   . Diabetes type II Mother   . CAD Maternal Grandfather   . CAD Maternal Grandmother     Review of Systems:  As stated in the HPI and otherwise negative.   BP 120/70   Pulse 64   Ht 5\' 9"  (1.753 m)   Wt 202 lb (91.6 kg)   SpO2 96%   BMI 29.83 kg/m   Physical Examination:  General: Well developed, well nourished, NAD  HEENT: OP clear, mucus membranes moist  SKIN: warm, dry. No rashes. Neuro: No focal deficits  Musculoskeletal: Muscle strength 5/5 all ext  Psychiatric: Mood and affect normal  Neck: No JVD, no carotid bruits, no thyromegaly, no lymphadenopathy.  Lungs:Clear bilaterally, no wheezes, rhonci, crackles Cardiovascular: Regular rate and  rhythm. No murmurs, gallops or rubs. Abdomen:Soft. Bowel sounds present. Non-tender.  Extremities: No lower extremity edema. Pulses are 2 + in the bilateral DP/PT.  Echo 02/16/17: - Left ventricle: The cavity size was normal. There was mild focal   basal hypertrophy of the septum. Systolic function was moderately   reduced. The estimated ejection fraction was in the range  of 35%   to 40%. Global hypokinesis with apical dyskinesis. Features are   consistent with a pseudonormal left ventricular filling pattern,   with concomitant abnormal relaxation and increased filling   pressure (grade 2 diastolic dysfunction). Doppler parameters are   consistent with indeterminate ventricular filling pressure. - Aortic valve: Transvalvular velocity was within the normal range.   There was no stenosis. There was trivial regurgitation. - Left atrium: The atrium was mildly dilated. - Right ventricle: The cavity size was normal. Wall thickness was   normal. Systolic function was normal. - Tricuspid valve: There was mild regurgitation. - Pulmonary arteries: Systolic pressure was within the normal   range. PA peak pressure: 30 mm Hg (S).  Cardiac cath 02/26/17:  Ost RCA to Prox RCA lesion, 40 %stenosed.  Prox RCA lesion, 60 %stenosed.  Mid RCA lesion, 100 %stenosed.  1st Mrg lesion, 30 %stenosed.  Mid Cx lesion, 75 %stenosed.  Ost LAD to Prox LAD lesion, 20 %stenosed.  1st Diag lesion, 60 %stenosed.   1. Triple vessel CAD 2. Patent stent proximal LAD with minimal in stent restenosis 3. Severe stenosis distal Circumflex artery 4. Successful PTCA/DES x 1 distal Circumflex 5. Patent proximal RCA stent followed by chronic total occlusion of RCA in the mid segment. The distal RCA fills from left to right collaterals.   Diagnostic Diagram      PCI of the distal Circumflex was peformed. Xience Sierra 2.75 x 12 mm stent placed.   EKG:  EKG is not  ordered today. The ekg ordered today  demonstrates   Recent Labs: 03/07/2017: BUN 18; Creatinine, Ser 1.16; Hemoglobin 12.1; Platelets 183; Potassium 3.8; Sodium 140   Lipid Panel Followed in primary care   Wt Readings from Last 3 Encounters:  03/09/17 202 lb (91.6 kg)  03/07/17 198 lb 9.6 oz (90.1 kg)  02/26/17 205 lb (93 kg)     Other studies Reviewed: Additional studies/ records that were reviewed today include: . Review of the above records demonstrates:   Assessment and Plan:   1. CAD with chronic stable angina: He has had mild chest pain. He stopped Imdur due to headache. Recent cath on 02/26/17 and stent placed in the distal Circumflex artery. Admission to [facility] 03/06/17 with chest pain but no objective evidence of ischemia. I will have him try Imdur again. Continue ASA, Plavix, Imdur, Norvasc. He does not tolerate beta blockers due to bradycardia.    2. HTN: BP is controlled. No changes today.   3. HLD: LDL near goal. Continue statin.   4. Ischemic cardiomyopathy: LVEF=35-40% by echo 02/16/17. He has not tolerated beta blockers or Ace-inh/ARBs.    I spent 20 minutes today working on his FMLA paperwork, other paperwork for leave from work and a letter for his work.  This did not include the time spent discussing his cardiac disease.   Current medicines are reviewed at length with the patient today.  The patient does not have concerns regarding medicines.  The following changes have been made:  no change  Labs/ tests ordered today include:   No orders of the defined types were placed in this encounter.   Disposition:   FU with me in 6 months.   Signed, Verne Carrow, MD 03/09/2017 11:34 AM    [facility] [address] [place], [place], [place]  [address] Phone: [phone]; Fax: [phone]

## 2017-03-09 ENCOUNTER — Encounter: Payer: Self-pay | Admitting: Cardiovascular Disease

## 2017-03-09 ENCOUNTER — Ambulatory Visit (INDEPENDENT_AMBULATORY_CARE_PROVIDER_SITE_OTHER): Payer: Federal, State, Local not specified - PPO | Admitting: Cardiovascular Disease

## 2017-03-09 VITALS — BP 120/70 | HR 64 | Ht 69.0 in | Wt 202.0 lb

## 2017-03-09 DIAGNOSIS — I25118 Atherosclerotic heart disease of native coronary artery with other forms of angina pectoris: Secondary | ICD-10-CM | POA: Diagnosis not present

## 2017-03-09 DIAGNOSIS — I255 Ischemic cardiomyopathy: Secondary | ICD-10-CM

## 2017-03-09 DIAGNOSIS — I10 Essential (primary) hypertension: Secondary | ICD-10-CM | POA: Diagnosis not present

## 2017-03-09 DIAGNOSIS — E7800 Pure hypercholesterolemia, unspecified: Secondary | ICD-10-CM

## 2017-03-09 NOTE — Patient Instructions (Signed)

## 2017-03-13 ENCOUNTER — Encounter (HOSPITAL_COMMUNITY): Payer: Self-pay

## 2017-03-13 NOTE — Telephone Encounter (Signed)
2nd attempt to call patient in regards to Cardiac Rehab - Lm on Vm. Sent letter. °

## 2017-03-20 ENCOUNTER — Telehealth (HOSPITAL_COMMUNITY): Payer: Self-pay

## 2017-03-20 NOTE — Telephone Encounter (Signed)
3rd attempt to call patient in regards to Cardiac Rehab - Lm on Vm. °

## 2017-03-27 ENCOUNTER — Telehealth (HOSPITAL_COMMUNITY): Payer: Self-pay

## 2017-03-27 NOTE — Telephone Encounter (Signed)
Have not received any response from patient - Closed referral.

## 2017-04-05 ENCOUNTER — Other Ambulatory Visit: Payer: Self-pay | Admitting: Cardiovascular Disease

## 2017-05-30 DIAGNOSIS — J069 Acute upper respiratory infection, unspecified: Secondary | ICD-10-CM | POA: Diagnosis not present

## 2017-06-14 ENCOUNTER — Telehealth (HOSPITAL_COMMUNITY): Payer: Self-pay

## 2017-06-14 NOTE — Telephone Encounter (Signed)
Patients wife called to schedule patient for Cardiac Rehab - Scheduled orientation on 07/31/2017 at 8:45am. Patient will attend the 8:15am exc class.

## 2017-07-03 ENCOUNTER — Other Ambulatory Visit: Payer: Self-pay | Admitting: Cardiology

## 2017-07-27 ENCOUNTER — Telehealth (HOSPITAL_COMMUNITY): Payer: Self-pay

## 2017-07-30 DIAGNOSIS — J309 Allergic rhinitis, unspecified: Secondary | ICD-10-CM | POA: Diagnosis not present

## 2017-07-30 DIAGNOSIS — F430 Acute stress reaction: Secondary | ICD-10-CM | POA: Diagnosis not present

## 2017-07-30 NOTE — Telephone Encounter (Signed)
Cardiac Rehab - Pharmacy Resident Documentation   Patient unable to be reached prior to upcoming cardiac rehab appointment. Please complete allergy verification and medication review during patient's cardiac rehab appointment.   Thanks, Nolen Mu PharmD PGY1 Pharmacy Practice Resident 07/30/2017 6:21 PM Pager: [phone]

## 2017-07-31 ENCOUNTER — Encounter (HOSPITAL_COMMUNITY)
Admission: RE | Admit: 2017-07-31 | Discharge: 2017-07-31 | Disposition: A | Discharge status: Home / Self Care | Payer: Federal, State, Local not specified - PPO | Source: Ambulatory Visit | Attending: Cardiovascular Disease | Admitting: Cardiovascular Disease

## 2017-07-31 ENCOUNTER — Telehealth (HOSPITAL_COMMUNITY): Payer: Self-pay

## 2017-07-31 NOTE — Progress Notes (Signed)
Pt arrived for his orientation appt for cardiac rehab accompanied by his wife. Pt stated that he did not feel well. Pt complains of upper respiratory illness but no fever.  Pt went to the doctor yesterday and was given some medications.  Pt took the medication and it made him feel sleepy. Offered pt to leave take nap and return for the afternoon orientation appt.  Pt declined and wished to reschedule citing that he just wanted to go to bed.  Pt rescheduled for 5/2 and will be placed on cancellation list.  Karlene Lineman RN, BSN Cardiac and Pulmonary Rehab Nurse Navigator

## 2017-07-31 NOTE — Telephone Encounter (Signed)
Patient showed up to Orientation not feeling well and felt like he could not complete orientation. Rescheduled orientation for 09/20/2017 at 8:30am.

## 2017-08-03 DIAGNOSIS — F411 Generalized anxiety disorder: Secondary | ICD-10-CM | POA: Diagnosis not present

## 2017-08-03 DIAGNOSIS — F329 Major depressive disorder, single episode, unspecified: Secondary | ICD-10-CM | POA: Diagnosis not present

## 2017-08-06 ENCOUNTER — Ambulatory Visit (HOSPITAL_COMMUNITY): Payer: Federal, State, Local not specified - PPO

## 2017-08-06 ENCOUNTER — Encounter (HOSPITAL_COMMUNITY): Payer: Federal, State, Local not specified - PPO

## 2017-08-08 ENCOUNTER — Ambulatory Visit (HOSPITAL_COMMUNITY): Payer: Federal, State, Local not specified - PPO

## 2017-08-08 ENCOUNTER — Encounter (HOSPITAL_COMMUNITY): Payer: Federal, State, Local not specified - PPO

## 2017-08-09 ENCOUNTER — Encounter (HOSPITAL_COMMUNITY): Payer: Federal, State, Local not specified - PPO

## 2017-08-10 ENCOUNTER — Ambulatory Visit (HOSPITAL_COMMUNITY): Payer: Federal, State, Local not specified - PPO

## 2017-08-13 ENCOUNTER — Ambulatory Visit (HOSPITAL_COMMUNITY): Payer: Federal, State, Local not specified - PPO

## 2017-08-13 ENCOUNTER — Encounter (HOSPITAL_COMMUNITY): Payer: Federal, State, Local not specified - PPO

## 2017-08-15 ENCOUNTER — Ambulatory Visit (HOSPITAL_COMMUNITY): Payer: Federal, State, Local not specified - PPO

## 2017-08-15 ENCOUNTER — Encounter (HOSPITAL_COMMUNITY): Payer: Federal, State, Local not specified - PPO

## 2017-08-16 ENCOUNTER — Encounter (HOSPITAL_COMMUNITY): Payer: Federal, State, Local not specified - PPO

## 2017-08-17 ENCOUNTER — Ambulatory Visit (HOSPITAL_COMMUNITY): Payer: Federal, State, Local not specified - PPO

## 2017-08-20 ENCOUNTER — Encounter (HOSPITAL_COMMUNITY): Payer: Federal, State, Local not specified - PPO

## 2017-08-20 ENCOUNTER — Ambulatory Visit (HOSPITAL_COMMUNITY): Payer: Federal, State, Local not specified - PPO

## 2017-08-22 ENCOUNTER — Encounter (HOSPITAL_COMMUNITY): Payer: Federal, State, Local not specified - PPO

## 2017-08-22 ENCOUNTER — Ambulatory Visit (HOSPITAL_COMMUNITY): Payer: Federal, State, Local not specified - PPO

## 2017-08-23 ENCOUNTER — Encounter (HOSPITAL_COMMUNITY): Payer: Federal, State, Local not specified - PPO

## 2017-08-24 ENCOUNTER — Ambulatory Visit (HOSPITAL_COMMUNITY): Payer: Federal, State, Local not specified - PPO

## 2017-08-27 ENCOUNTER — Ambulatory Visit (HOSPITAL_COMMUNITY): Payer: Federal, State, Local not specified - PPO

## 2017-08-27 ENCOUNTER — Encounter (HOSPITAL_COMMUNITY): Payer: Federal, State, Local not specified - PPO

## 2017-08-29 ENCOUNTER — Encounter (HOSPITAL_COMMUNITY): Payer: Federal, State, Local not specified - PPO

## 2017-08-29 ENCOUNTER — Ambulatory Visit (HOSPITAL_COMMUNITY): Payer: Federal, State, Local not specified - PPO

## 2017-08-29 DIAGNOSIS — E785 Hyperlipidemia, unspecified: Secondary | ICD-10-CM | POA: Diagnosis not present

## 2017-08-29 DIAGNOSIS — E559 Vitamin D deficiency, unspecified: Secondary | ICD-10-CM | POA: Diagnosis not present

## 2017-08-30 ENCOUNTER — Encounter (HOSPITAL_COMMUNITY): Payer: Federal, State, Local not specified - PPO

## 2017-08-31 ENCOUNTER — Ambulatory Visit (HOSPITAL_COMMUNITY): Payer: Federal, State, Local not specified - PPO

## 2017-09-03 ENCOUNTER — Ambulatory Visit (HOSPITAL_COMMUNITY): Payer: Federal, State, Local not specified - PPO

## 2017-09-03 ENCOUNTER — Encounter (HOSPITAL_COMMUNITY): Payer: Federal, State, Local not specified - PPO

## 2017-09-05 ENCOUNTER — Encounter (HOSPITAL_COMMUNITY): Payer: Federal, State, Local not specified - PPO

## 2017-09-05 ENCOUNTER — Ambulatory Visit (HOSPITAL_COMMUNITY): Payer: Federal, State, Local not specified - PPO

## 2017-09-05 DIAGNOSIS — I2510 Atherosclerotic heart disease of native coronary artery without angina pectoris: Secondary | ICD-10-CM | POA: Diagnosis not present

## 2017-09-05 DIAGNOSIS — I252 Old myocardial infarction: Secondary | ICD-10-CM | POA: Diagnosis not present

## 2017-09-05 DIAGNOSIS — R918 Other nonspecific abnormal finding of lung field: Secondary | ICD-10-CM | POA: Diagnosis not present

## 2017-09-05 DIAGNOSIS — I10 Essential (primary) hypertension: Secondary | ICD-10-CM | POA: Diagnosis not present

## 2017-09-06 ENCOUNTER — Encounter (HOSPITAL_COMMUNITY): Payer: Federal, State, Local not specified - PPO

## 2017-09-07 ENCOUNTER — Ambulatory Visit (HOSPITAL_COMMUNITY): Payer: Federal, State, Local not specified - PPO

## 2017-09-10 ENCOUNTER — Encounter (HOSPITAL_COMMUNITY): Payer: Federal, State, Local not specified - PPO

## 2017-09-10 ENCOUNTER — Ambulatory Visit (HOSPITAL_COMMUNITY): Payer: Federal, State, Local not specified - PPO

## 2017-09-12 ENCOUNTER — Ambulatory Visit (HOSPITAL_COMMUNITY): Payer: Federal, State, Local not specified - PPO

## 2017-09-12 ENCOUNTER — Encounter (HOSPITAL_COMMUNITY): Payer: Federal, State, Local not specified - PPO

## 2017-09-13 ENCOUNTER — Encounter (HOSPITAL_COMMUNITY): Payer: Federal, State, Local not specified - PPO

## 2017-09-14 ENCOUNTER — Ambulatory Visit (HOSPITAL_COMMUNITY): Payer: Federal, State, Local not specified - PPO

## 2017-09-17 ENCOUNTER — Ambulatory Visit (HOSPITAL_COMMUNITY): Payer: Federal, State, Local not specified - PPO

## 2017-09-17 ENCOUNTER — Encounter (HOSPITAL_COMMUNITY): Payer: Federal, State, Local not specified - PPO

## 2017-09-19 ENCOUNTER — Ambulatory Visit (HOSPITAL_COMMUNITY): Payer: Federal, State, Local not specified - PPO

## 2017-09-19 ENCOUNTER — Encounter (HOSPITAL_COMMUNITY): Payer: Federal, State, Local not specified - PPO

## 2017-09-20 ENCOUNTER — Encounter (HOSPITAL_COMMUNITY): Payer: Federal, State, Local not specified - PPO

## 2017-09-20 ENCOUNTER — Encounter (HOSPITAL_COMMUNITY)
Admission: RE | Admit: 2017-09-20 | Discharge: 2017-09-20 | Disposition: A | Discharge status: Home / Self Care | Payer: Federal, State, Local not specified - PPO | Source: Ambulatory Visit | Attending: Cardiovascular Disease | Admitting: Cardiovascular Disease

## 2017-09-20 ENCOUNTER — Encounter (HOSPITAL_COMMUNITY): Payer: Self-pay

## 2017-09-20 VITALS — BP 112/78 | HR 58 | Ht 68.5 in | Wt 199.3 lb

## 2017-09-20 DIAGNOSIS — Z955 Presence of coronary angioplasty implant and graft: Secondary | ICD-10-CM | POA: Insufficient documentation

## 2017-09-20 NOTE — Progress Notes (Signed)
Austin Ingram 57 y.o. male DOB: Sep 27, 1960 MRN: [id number]      Nutrition Note  1. Status post coronary artery stent placement    Past Medical History:  Diagnosis Date  . Allergic rhinitis   . Anxiety   . Arthritis    "lower back, right thumb, knees" (10/04/2012)  . Asthma    "grew out of it" (10/04/2012)  . Coronary artery disease    a. s/p prior stent to LAD;  b. LHC 6/14: LAD stent patent, mCFX 50%, pRCA 95-99%, mRCA 75%, EF 45% with inf HK => PCI: Promus DES to pRCA  . Depression   . Hyperlipidemia   . Hypertension   . MI (myocardial infarction) (HCC) 03/2007  . OSA (obstructive sleep apnea)    mild  . Pneumonia    "as a child" (10/04/2012)   Meds reviewed.   HT: Ht Readings from Last 1 Encounters:  03/09/17 5\' 9"  (1.753 m)    WT: Wt Readings from Last 5 Encounters:  03/09/17 202 lb (91.6 kg)  03/07/17 198 lb 9.6 oz (90.1 kg)  02/26/17 205 lb (93 kg)  02/20/17 205 lb 8 oz (93.2 kg)  02/01/17 201 lb (91.2 kg)     BMI 29.8  Current tobacco use? No   Labs:  Lipid Panel     Component Value Date/Time   CHOL 135 03/06/2017 0525   TRIG 86 03/06/2017 0525   HDL 41 03/06/2017 0525   CHOLHDL 3.3 03/06/2017 0525   VLDL 17 03/06/2017 0525   LDLCALC 77 03/06/2017 0525   LDLDIRECT 59.9 06/03/2007 1114    Lab Results  Component Value Date   HGBA1C 6.2 (H) 03/06/2017   CBG (last 3)  No results for input(s): GLUCAP in the last 72 hours.  Nutrition Note Spoke with pt and pt's wife. Nutrition plan and goals reviewed with pt. Pt is not currently following a heart healthy diet. Pt does want to work towards a heart healthier diet. Per discussion, pt lost ~40 lb 10 years ago after his first heart event. Pt states he has maintained his wt @ 198-200 lb over the past 10 years. Pt is pre-diabetic according to his last A1c. Pt expressed understanding of the information reviewed. Pt aware of nutrition education classes offered.  Nutrition Diagnosis ? Food-and nutrition-related  knowledge deficit related to lack of exposure to information as related to diagnosis of: ? CVD   Nutrition Intervention ? Pt's individual nutrition plan and goals reviewed with pt. ? Pt given handouts for: ? Nutrition I class ? Nutrition II class   Nutrition Goal(s):  ? Pt to identify and limit food sources of saturated fat, trans fat, and sodium  Plan:  Pt to attend nutrition classes ? Nutrition I ? Nutrition II ? Portion Distortion  Will provide client-centered nutrition education as part of interdisciplinary care.   Monitor and evaluate progress toward nutrition goal with team.  Mickle Plumb, M.Ed, RD, LDN, CDE 09/20/2017 10:23 AM

## 2017-09-20 NOTE — Progress Notes (Signed)
Cardiac Rehab Medication Review by a RN  Does the patient  feel that his/her medications are working for him/her?  yes  Has the patient been experiencing any side effects to the medications prescribed?  no  Does the patient measure his/her own blood pressure or blood glucose at home?  yes He checks his blood pressure.  Does the patient have any problems obtaining medications due to transportation or finances?   no  Understanding of regimen: excellent Understanding of indications: excellent Potential of compliance: excellent    RN comments: Austin Ingram has a great understanding of his medications and does not report any side effects from his medicine. Austin Ingram does not have any concerns with his medications as this time.      Nikki Dom 09/20/2017 9:43 AM

## 2017-09-20 NOTE — Progress Notes (Signed)
Cardiac Individual Treatment Plan  Patient Details  Name: Austin Ingram MRN: [id number] Date of Birth: 1960-09-26 Referring Provider:     CARDIAC REHAB PHASE II ORIENTATION from 09/20/2017 in [facility] CARDIAC REHAB  Referring Provider  Verne Carrow MD       Initial Encounter Date:    CARDIAC REHAB PHASE II ORIENTATION from 09/20/2017 in [facility] CARDIAC REHAB  Date  09/20/17  Referring Provider  Verne Carrow MD       Visit Diagnosis: Status post coronary artery stent placement 02/26/17 Distal circumflex  Patient's Home Medications on Admission:  Current Outpatient Medications:  .  aliskiren (TEKTURNA) 300 MG tablet, Take 300 mg by mouth daily with breakfast. , Disp: , Rfl:  .  amLODipine (NORVASC) 10 MG tablet, TAKE 1 TABLET (10 MG TOTAL) BY MOUTH DAILY., Disp: 90 tablet, Rfl: 2 .  aspirin EC 81 MG tablet, Take 81 mg by mouth daily with breakfast., Disp: , Rfl:  .  atorvastatin (LIPITOR) 40 MG tablet, Take 1 tablet (40 mg total) daily by mouth., Disp: 30 tablet, Rfl: 2 .  citalopram (CELEXA) 20 MG tablet, Take 20 mg by mouth daily with breakfast. , Disp: , Rfl:  .  clopidogrel (PLAVIX) 75 MG tablet, TAKE 1 TABLET BY MOUTH EVERY DAY WITH BREAKFAST, Disp: 90 tablet, Rfl: 2 .  etodolac (LODINE) 400 MG tablet, Take 400 mg by mouth 2 (two) times daily as needed (FOR PAIN.). , Disp: , Rfl: 0 .  finasteride (PROSCAR) 5 MG tablet, Take 5 mg by mouth daily., Disp: , Rfl:  .  hydrochlorothiazide (HYDRODIURIL) 25 MG tablet, TAKE 1 TABLET (25 MG TOTAL) BY MOUTH DAILY., Disp: 30 tablet, Rfl: 10 .  isosorbide mononitrate (IMDUR) 30 MG 24 hr tablet, TAKE 1 TABLET (30 MG TOTAL) DAILY BY MOUTH., Disp: 90 tablet, Rfl: 1 .  nitroGLYCERIN (NITROSTAT) 0.4 MG SL tablet, PLACE 1 TABLET UNDER THE TONGUE EVERY 5 MINUTES AS NEEDED FOR CHEST PAIN., Disp: 25 tablet, Rfl: 5 .  tamsulosin (FLOMAX) 0.4 MG CAPS capsule, Take 0.4 mg by mouth daily with breakfast.  , Disp: , Rfl:   Past Medical History: Past Medical History:  Diagnosis Date  . Allergic rhinitis   . Anxiety   . Arthritis    "lower back, right thumb, knees" (10/04/2012)  . Asthma    "grew out of it" (10/04/2012)  . Coronary artery disease    a. s/p prior stent to LAD;  b. LHC 6/14: LAD stent patent, mCFX 50%, pRCA 95-99%, mRCA 75%, EF 45% with inf HK => PCI: Promus DES to pRCA  . Depression   . Hyperlipidemia   . Hypertension   . MI (myocardial infarction) (HCC) 03/2007  . OSA (obstructive sleep apnea)    mild  . Pneumonia    "as a child" (10/04/2012)    Tobacco Use: Social History   Tobacco Use  Smoking Status Former Smoker  . Packs/day: 0.50  . Years: 20.00  . Pack years: 10.00  . Types: Cigarettes  . Last attempt to quit: 12/31/1998  . Years since quitting: 18.7  Smokeless Tobacco Never Used    Labs: Recent Hydrographic surveyor    Labs for ITP Cardiac and Pulmonary Rehab Latest Ref Rng & Units 08/02/2007 12/22/2010 06/09/2011 09/03/2012 03/06/2017   Cholestrol 0 - 200 mg/dL 829 562 130 865 784   LDLCALC 0 - 99 mg/dL 94 43 68 74 77   LDLDIRECT mg/dL - - - - -  HDL >40 mg/dL 33.2(L) 44.30 46.40 43.20 41   Trlycerides <150 mg/dL 59 960.4 54.0 98.1 86   Hemoglobin A1c 4.8 - 5.6 % - - - - 6.2(H)      Capillary Blood Glucose: No results found for: GLUCAP   Exercise Target Goals: Date: 09/20/17  Exercise Program Goal: Individual exercise prescription set using results from initial 6 min walk test and THRR while considering  patient's activity barriers and safety.   Exercise Prescription Goal: Initial exercise prescription builds to 30-45 minutes a day of aerobic activity, 2-3 days per week.  Home exercise guidelines will be given to patient during program as part of exercise prescription that the participant will acknowledge.  Activity Barriers & Risk Stratification: Activity Barriers & Cardiac Risk Stratification - 09/20/17 0913      Activity Barriers & Cardiac  Risk Stratification   Activity Barriers  Arthritis;Back Problems    Cardiac Risk Stratification  Moderate       6 Minute Walk: 6 Minute Walk    Row Name 09/20/17 1004         6 Minute Walk   Phase  Initial     Distance  1843 feet     Walk Time  6 minutes     # of Rest Breaks  0     MPH  4.6     METS  4.51     RPE  11     Perceived Dyspnea   0     VO2 Peak  15.8     Symptoms  No     Resting HR  58 bpm     Resting BP  112/78     Resting Oxygen Saturation   99 %     Exercise Oxygen Saturation  during 6 min walk  99 %     Max Ex. HR  85 bpm     Max Ex. BP  140/80     2 Minute Post BP  138/70        Oxygen Initial Assessment:   Oxygen Re-Evaluation:   Oxygen Discharge (Final Oxygen Re-Evaluation):   Initial Exercise Prescription: Initial Exercise Prescription - 09/20/17 1000      Date of Initial Exercise RX and Referring Provider   Date  09/20/17    Referring Provider  Verne Carrow MD       Treadmill   MPH  3    Grade  2    Minutes  10    METs  4.12      Bike   Level  1.5    Minutes  10    METs  4.14      NuStep   Level  3    SPM  85    Minutes  10    METs  4      Prescription Details   Frequency (times per week)  2x    Duration  Progress to 30 minutes of continuous aerobic without signs/symptoms of physical distress      Intensity   THRR 40-80% of Max Heartrate  66-133    Ratings of Perceived Exertion  11-13    Perceived Dyspnea  0-4      Progression   Progression  Continue progressive overload as per policy without signs/symptoms or physical distress.      Resistance Training   Training Prescription  Yes    Weight  5lbs    Reps  10-15       Perform Capillary Blood Glucose  checks as needed.  Exercise Prescription Changes:   Exercise Comments:   Exercise Goals and Review: Exercise Goals    Row Name 09/20/17 0913             Exercise Goals   Increase Physical Activity  Yes       Intervention  Provide advice,  education, support and counseling about physical activity/exercise needs.;Develop an individualized exercise prescription for aerobic and resistive training based on initial evaluation findings, risk stratification, comorbidities and participant's personal goals.       Expected Outcomes  Long Term: Exercising regularly at least 3-5 days a week.;Short Term: Attend rehab on a regular basis to increase amount of physical activity.;Long Term: Add in home exercise to make exercise part of routine and to increase amount of physical activity.       Increase Strength and Stamina  Yes       Intervention  Provide advice, education, support and counseling about physical activity/exercise needs.;Develop an individualized exercise prescription for aerobic and resistive training based on initial evaluation findings, risk stratification, comorbidities and participant's personal goals.       Expected Outcomes  Short Term: Increase workloads from initial exercise prescription for resistance, speed, and METs.;Long Term: Improve cardiorespiratory fitness, muscular endurance and strength as measured by increased METs and functional capacity ( );Short Term: Perform resistance training exercises routinely during rehab and add in resistance training at home       Able to understand and use rate of perceived exertion (RPE) scale  Yes       Intervention  Provide education and explanation on how to use RPE scale       Expected Outcomes  Short Term: Able to use RPE daily in rehab to express subjective intensity level;Long Term:  Able to use RPE to guide intensity level when exercising independently       Knowledge and understanding of Target Heart Rate Range (THRR)  Yes       Intervention  Provide education and explanation of THRR including how the numbers were predicted and where they are located for reference       Expected Outcomes  Short Term: Able to state/look up THRR;Long Term: Able to use THRR to govern intensity when  exercising independently;Short Term: Able to use daily as guideline for intensity in rehab       Able to check pulse independently  Yes       Intervention  Provide education and demonstration on how to check pulse in carotid and radial arteries.;Review the importance of being able to check your own pulse for safety during independent exercise       Expected Outcomes  Long Term: Able to check pulse independently and accurately;Short Term: Able to explain why pulse checking is important during independent exercise       Understanding of Exercise Prescription  Yes       Intervention  Provide education, explanation, and written materials on patient's individual exercise prescription       Expected Outcomes  Short Term: Able to explain program exercise prescription;Long Term: Able to explain home exercise prescription to exercise independently          Exercise Goals Re-Evaluation :    Discharge Exercise Prescription (Final Exercise Prescription Changes):   Nutrition:  Target Goals: Understanding of nutrition guidelines, daily intake of sodium 1500mg , cholesterol 200mg , calories 30% from fat and 7% or less from saturated fats, daily to have 5 or more servings of fruits and vegetables.  Biometrics:  Pre Biometrics - 09/20/17 1033      Pre Biometrics   Height  5' 8.5" (1.74 m)    Weight  199 lb 4.7 oz (90.4 kg)    Waist Circumference  39 inches    Hip Circumference  40.25 inches    Waist to Hip Ratio  0.97 %    BMI (Calculated)  29.86    Triceps Skinfold  18 mm    % Body Fat  28.1 %    Grip Strength  42 kg    Flexibility  15 in    Single Leg Stand  30 seconds        Nutrition Therapy Plan and Nutrition Goals: Nutrition Therapy & Goals - 09/20/17 1032      Nutrition Therapy   Diet  Heart Healthy      Personal Nutrition Goals   Nutrition Goal  Pt to identify and limit food sources of saturated fat, trans fat, and sodium      Intervention Plan   Intervention  Prescribe,  educate and counsel regarding individualized specific dietary modifications aiming towards targeted core components such as weight, hypertension, lipid management, diabetes, heart failure and other comorbidities.    Expected Outcomes  Short Term Goal: Understand basic principles of dietary content, such as calories, fat, sodium, cholesterol and nutrients.;Long Term Goal: Adherence to prescribed nutrition plan.       Nutrition Assessments: Nutrition Assessments - 09/20/17 1032      MEDFICTS Scores   Pre Score  79       Nutrition Goals Re-Evaluation:   Nutrition Goals Re-Evaluation:   Nutrition Goals Discharge (Final Nutrition Goals Re-Evaluation):   Psychosocial: Target Goals: Acknowledge presence or absence of significant depression and/or stress, maximize coping skills, provide positive support system. Participant is able to verbalize types and ability to use techniques and skills needed for reducing stress and depression.  Initial Review & Psychosocial Screening: Initial Psych Review & Screening - 09/20/17 1116      Initial Review   Current issues with  Current Stress Concerns    Source of Stress Concerns  Family      Family Dynamics   Good Support System?  Yes      Barriers   Psychosocial barriers to participate in program  The patient should benefit from training in stress management and relaxation.      Screening Interventions   Interventions  Encouraged to exercise;To provide support and resources with identified psychosocial needs    Expected Outcomes  Long Term Goal: Stressors or current issues are controlled or eliminated.;Short Term goal: Utilizing psychosocial counselor, staff and physician to assist with identification of specific Stressors or current issues interfering with healing process. Setting desired goal for each stressor or current issue identified.       Quality of Life Scores: Quality of Life - 09/20/17 1003      Quality of Life Scores    Health/Function Pre  23.6 %    Socioeconomic Pre  23 %    Psych/Spiritual Pre  30 %    Family Pre  23.5 %    GLOBAL Pre  24.73 %      Scores of 19 and below usually indicate a poorer quality of life in these areas.  A difference of  2-3 points is a clinically meaningful difference.  A difference of 2-3 points in the total score of the Quality of Life Index has been associated with significant improvement in overall quality of life, self-image, physical symptoms, and  general health in studies assessing change in quality of life.  PHQ-9: Recent Review Flowsheet Data    There is no flowsheet data to display.     Interpretation of Total Score  Total Score Depression Severity:  1-4 = Minimal depression, 5-9 = Mild depression, 10-14 = Moderate depression, 15-19 = Moderately severe depression, 20-27 = Severe depression   Psychosocial Evaluation and Intervention:   Psychosocial Re-Evaluation:   Psychosocial Discharge (Final Psychosocial Re-Evaluation):   Vocational Rehabilitation: Provide vocational rehab assistance to qualifying candidates.   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 Participant will state understanding/return demonstration of topics presented.  Learning Barriers/Preferences: Learning Barriers/Preferences - 09/20/17 0912      Learning Barriers/Preferences   Learning Barriers  Sight    Learning Preferences  Skilled Demonstration;Pictoral;Video;Written Material       Education Topics: Count Your Pulse:  -Group instruction provided by verbal instruction, demonstration, patient participation and written materials to  support subject.  Instructors address importance of being able to find your pulse and how to count your pulse when at home without a heart monitor.  Patients get hands on experience counting their pulse with staff help and individually.   Heart Attack, Angina, and Risk Factor Modification:  -Group instruction provided by verbal instruction, video, and written materials to support subject.  Instructors address signs and symptoms of angina and heart attacks.    Also discuss risk factors for heart disease and how to make changes to improve heart health risk factors.   Functional Fitness:  -Group instruction provided by verbal instruction, demonstration, patient participation, and written materials to support subject.  Instructors address safety measures for doing things around the house.  Discuss how to get up and down off the floor, how to pick things up properly, how to safely get out of a chair without assistance, and balance training.   Meditation and Mindfulness:  -Group instruction provided by verbal instruction, patient participation, and written materials to support subject.  Instructor addresses importance of mindfulness and meditation practice to help reduce stress and improve awareness.  Instructor also leads participants through a meditation exercise.    Stretching for Flexibility and Mobility:  -Group instruction provided by verbal instruction, patient participation, and written materials to support subject.  Instructors lead participants through series of stretches that are designed to increase flexibility thus improving mobility.  These stretches are additional exercise for major muscle groups that are typically performed during regular warm up and cool down.   Hands Only CPR:  -Group verbal, video, and participation provides a basic overview of AHA guidelines for community CPR. Role-play of emergencies allow participants the opportunity to practice calling for help and chest  compression technique with discussion of AED use.   Hypertension: -Group verbal and written instruction that provides a basic overview of hypertension including the most recent diagnostic guidelines, risk factor reduction with self-care instructions and medication management.    Nutrition I class: Heart Healthy Eating:  -Group instruction provided by PowerPoint slides, verbal discussion, and written materials to support subject matter. The instructor gives an explanation and review of the Therapeutic Lifestyle Changes diet recommendations, which includes a discussion  on lipid goals, dietary fat, sodium, fiber, plant stanol/sterol esters, sugar, and the components of a well-balanced, healthy diet.   CARDIAC REHAB PHASE II ORIENTATION from 09/20/2017 in [facility] CARDIAC REHAB  Date  09/20/17  Educator  RD      Nutrition II class: Lifestyle Skills:  -Group instruction provided by PowerPoint slides, verbal discussion, and written materials to support subject matter. The instructor gives an explanation and review of label reading, grocery shopping for heart health, heart healthy recipe modifications, and ways to make healthier choices when eating out.   CARDIAC REHAB PHASE II ORIENTATION from 09/20/2017 in [facility] CARDIAC REHAB  Date  09/20/17  Educator  RD      Diabetes Question & Answer:  -Group instruction provided by PowerPoint slides, verbal discussion, and written materials to support subject matter. The instructor gives an explanation and review of diabetes co-morbidities, pre- and post-prandial blood glucose goals, pre-exercise blood glucose goals, signs, symptoms, and treatment of hypoglycemia and hyperglycemia, and foot care basics.   Diabetes Blitz:  -Group instruction provided by PowerPoint slides, verbal discussion, and written materials to support subject matter. The instructor gives an explanation and review of the physiology behind type 1  and type 2 diabetes, diabetes medications and rational behind using different medications, pre- and post-prandial blood glucose recommendations and Hemoglobin A1c goals, diabetes diet, and exercise including blood glucose guidelines for exercising safely.    Portion Distortion:  -Group instruction provided by PowerPoint slides, verbal discussion, written materials, and food models to support subject matter. The instructor gives an explanation of serving size versus portion size, changes in portions sizes over the last 20 years, and what consists of a serving from each food group.   Stress Management:  -Group instruction provided by verbal instruction, video, and written materials to support subject matter.  Instructors review role of stress in heart disease and how to cope with stress positively.     Exercising on Your Own:  -Group instruction provided by verbal instruction, power point, and written materials to support subject.  Instructors discuss benefits of exercise, components of exercise, frequency and intensity of exercise, and end points for exercise.  Also discuss use of nitroglycerin and activating EMS.  Review options of places to exercise outside of rehab.  Review guidelines for sex with heart disease.   Cardiac Drugs I:  -Group instruction provided by verbal instruction and written materials to support subject.  Instructor reviews cardiac drug classes: antiplatelets, anticoagulants, beta blockers, and statins.  Instructor discusses reasons, side effects, and lifestyle considerations for each drug class.   Cardiac Drugs II:  -Group instruction provided by verbal instruction and written materials to support subject.  Instructor reviews cardiac drug classes: angiotensin converting enzyme inhibitors (ACE-I), angiotensin II receptor blockers (ARBs), nitrates, and calcium channel blockers.  Instructor discusses reasons, side effects, and lifestyle considerations for each drug  class.   Anatomy and Physiology of the Circulatory System:  Group verbal and written instruction and models provide basic cardiac anatomy and physiology, with the coronary electrical and arterial systems. Review of: AMI, Angina, Valve disease, Heart Failure, Peripheral Artery Disease, Cardiac Arrhythmia, Pacemakers, and the ICD.   Other Education:  -Group or individual verbal, written, or video instructions that support the educational goals of the cardiac rehab program.   Holiday Eating Survival Tips:  -Group instruction provided by PowerPoint slides, verbal discussion, and written materials to support subject matter. The instructor gives patients tips, tricks, and techniques to help  them not only survive but enjoy the holidays despite the onslaught of food that accompanies the holidays.   Knowledge Questionnaire Score: Knowledge Questionnaire Score - 09/20/17 1035      Knowledge Questionnaire Score   Pre Score  19/24       Core Components/Risk Factors/Patient Goals at Admission: Personal Goals and Risk Factors at Admission - 09/20/17 1036      Core Components/Risk Factors/Patient Goals on Admission    Weight Management  Yes;Weight Loss    Intervention  Weight Management: Develop a combined nutrition and exercise program designed to reach desired caloric intake, while maintaining appropriate intake of nutrient and fiber, sodium and fats, and appropriate energy expenditure required for the weight goal.;Weight Management: Provide education and appropriate resources to help participant work on and attain dietary goals.;Weight Management/Obesity: Establish reasonable short term and long term weight goals.    Admit Weight  199 lb 4.7 oz (90.4 kg)    Goal Weight: Short Term  195 lb (88.5 kg)    Goal Weight: Long Term  190 lb (86.2 kg)    Expected Outcomes  Long Term: Adherence to nutrition and physical activity/exercise program aimed toward attainment of established weight goal;Weight  Loss: Understanding of general recommendations for a balanced deficit meal plan, which promotes 1-2 lb weight loss per week and includes a negative energy balance of [phone] kcal/d;Short Term: Continue to assess and modify interventions until short term weight is achieved;Understanding recommendations for meals to include 15-35% energy as protein, 25-35% energy from fat, 35-60% energy from carbohydrates, less than 200mg  of dietary cholesterol, 20-35 gm of total fiber daily;Understanding of distribution of calorie intake throughout the day with the consumption of 4-5 meals/snacks    Hypertension  Yes    Intervention  Monitor prescription use compliance.;Provide education on lifestyle modifcations including regular physical activity/exercise, weight management, moderate sodium restriction and increased consumption of fresh fruit, vegetables, and low fat dairy, alcohol moderation, and smoking cessation.    Expected Outcomes  Short Term: Continued assessment and intervention until BP is < 140/30mm HG in hypertensive participants. < 130/20mm HG in hypertensive participants with diabetes, heart failure or chronic kidney disease.;Long Term: Maintenance of blood pressure at goal levels.    Lipids  Yes    Intervention  Provide education and support for participant on nutrition & aerobic/resistive exercise along with prescribed medications to achieve LDL 70mg , HDL >40mg .    Expected Outcomes  Short Term: Participant states understanding of desired cholesterol values and is compliant with medications prescribed. Participant is following exercise prescription and nutrition guidelines.;Long Term: Cholesterol controlled with medications as prescribed, with individualized exercise RX and with personalized nutrition plan. Value goals: LDL < 70mg , HDL > 40 mg.    Stress  Yes    Intervention  Offer individual and/or small group education and counseling on adjustment to heart disease, stress management and health-related  lifestyle change. Teach and support self-help strategies.;Refer participants experiencing significant psychosocial distress to appropriate mental health specialists for further evaluation and treatment. When possible, include family members and significant others in education/counseling sessions.    Expected Outcomes  Short Term: Participant demonstrates changes in health-related behavior, relaxation and other stress management skills, ability to obtain effective social support, and compliance with psychotropic medications if prescribed.;Long Term: Emotional wellbeing is indicated by absence of clinically significant psychosocial distress or social isolation.       Core Components/Risk Factors/Patient Goals Review:    Core Components/Risk Factors/Patient Goals at Discharge (Final Review):    ITP  Comments: ITP Comments    Row Name 09/20/17 0911           ITP Comments  Dr. Armanda Magic, Medical Director          Comments: Teegan attended orientation from 0825 to 1008 to review rules and guidelines for program. Completed 6 minute walk test, Intitial ITP, and exercise prescription.  VSS. Telemetry-Sinus Rhythm with intermittent PVC's.  Asymptomatic.Gladstone Lighter, RN,BSN 09/20/2017 11:32 AM

## 2017-09-21 ENCOUNTER — Ambulatory Visit (HOSPITAL_COMMUNITY): Payer: Federal, State, Local not specified - PPO

## 2017-09-26 ENCOUNTER — Encounter (HOSPITAL_COMMUNITY): Payer: Federal, State, Local not specified - PPO

## 2017-09-26 ENCOUNTER — Ambulatory Visit (HOSPITAL_COMMUNITY): Payer: Federal, State, Local not specified - PPO

## 2017-09-27 ENCOUNTER — Encounter (HOSPITAL_COMMUNITY): Payer: Federal, State, Local not specified - PPO

## 2017-09-28 ENCOUNTER — Encounter (HOSPITAL_COMMUNITY)
Admission: RE | Admit: 2017-09-28 | Discharge: 2017-09-28 | Disposition: A | Discharge status: Home / Self Care | Payer: Federal, State, Local not specified - PPO | Source: Ambulatory Visit | Attending: Cardiovascular Disease | Admitting: Cardiovascular Disease

## 2017-09-28 ENCOUNTER — Ambulatory Visit (HOSPITAL_COMMUNITY): Payer: Federal, State, Local not specified - PPO

## 2017-09-28 ENCOUNTER — Encounter (HOSPITAL_COMMUNITY): Payer: Self-pay

## 2017-09-28 VITALS — Wt 201.3 lb

## 2017-09-28 DIAGNOSIS — Z955 Presence of coronary angioplasty implant and graft: Secondary | ICD-10-CM | POA: Diagnosis not present

## 2017-09-28 NOTE — Progress Notes (Signed)
Daily Session Note  Patient Details  Name: Solon Belloso MRN: [id number] Date of Birth: January 16, 1961 Referring Provider:   Flowsheet Row CARDIAC REHAB PHASE II ORIENTATION from 09/20/2017 in [place]  Referring Provider  Lauree Chandler MD       Encounter Date: 09/28/2017  Check In: Session Check In - 09/28/17 0916    Check-In          Location  [facility]    Staff Present  Dorna Bloom, MS, ACSM RCEP, Exercise Physiologist;Tara [place], RN BSN;Shalisa Mcquade, RN, Marga Melnick, RN, BSN    Supervising physician immediately available to respond to emergencies  [facility] immediately available    Physician(s)  Dr. Denton Brick    Medication changes reported      No    Fall or balance concerns reported     No    Tobacco Cessation  No Change    Warm-up and Cool-down  Performed as group-led instruction    Resistance Training Performed  Yes    VAD Patient?  No        Pain Assessment          Currently in Pain?  No/denies           Capillary Blood Glucose: No results found for this or any previous visit (from the past 24 hour(s)).  Exercise Prescription Changes - 09/28/17 1000    Response to Exercise          Blood Pressure (Admit)  100/64    Blood Pressure (Exercise)  152/72    Blood Pressure (Exit)  116/82    Heart Rate (Admit)  64 bpm    Heart Rate (Exercise)  99 bpm    Heart Rate (Exit)  63 bpm    Rating of Perceived Exertion (Exercise)  11    Symptoms  none    Comments  pt oriented to exercise equipment    Duration  Continue with 30 min of aerobic exercise without signs/symptoms of physical distress.    Intensity  THRR unchanged        Progression          Progression  Continue to progress workloads to maintain intensity without signs/symptoms of physical distress.    Average METs  3.3        Resistance Training          Training Prescription  Yes    Weight  5lbs    Reps  10-15    Time  10 Minutes        Treadmill          MPH  3    Grade  2    Minutes  10    METs  4.12        Bike          Level  1.5    Minutes  10    METs  4.14        NuStep          Level  3    SPM  85    Minutes  10    METs  2.4           Social History   Tobacco Use  Smoking Status Former Smoker  . Packs/day: 0.50  . Years: 20.00  . Pack years: 10.00  . Types: Cigarettes  . Last attempt to quit: 12/31/1998  . Years since quitting: 18.7  Smokeless Tobacco Never Used  Goals Met:  Exercise tolerated well  Goals Unmet:  Not Applicable  Comments: Pt started cardiac rehab today.  Pt tolerated light exercise without difficulty. VSS, telemetry-sinus rhythm,  asymptomatic.  Medication list reconciled. Pt denies barriers to medicaiton compliance.  PSYCHOSOCIAL ASSESSMENT:  PHQ-0.   Pt exhibits positive coping skills, hopeful outlook with supportive family. No psychosocial needs identified at this time, no psychosocial interventions necessary.    Pt enjoys riding motorcycles.     Pt oriented to exercise equipment and routine.    Understanding verbalized.   Dr. Fransico Him is Medical Director for Cardiac Rehab at [facility].

## 2017-10-01 ENCOUNTER — Ambulatory Visit (HOSPITAL_COMMUNITY): Payer: Federal, State, Local not specified - PPO

## 2017-10-01 ENCOUNTER — Encounter (HOSPITAL_COMMUNITY): Payer: Federal, State, Local not specified - PPO

## 2017-10-03 ENCOUNTER — Ambulatory Visit (HOSPITAL_COMMUNITY): Payer: Federal, State, Local not specified - PPO

## 2017-10-03 ENCOUNTER — Encounter (HOSPITAL_COMMUNITY): Payer: Federal, State, Local not specified - PPO

## 2017-10-04 ENCOUNTER — Encounter (HOSPITAL_COMMUNITY): Payer: Federal, State, Local not specified - PPO

## 2017-10-05 ENCOUNTER — Encounter (HOSPITAL_COMMUNITY): Payer: Federal, State, Local not specified - PPO

## 2017-10-05 ENCOUNTER — Ambulatory Visit (HOSPITAL_COMMUNITY): Payer: Federal, State, Local not specified - PPO

## 2017-10-08 ENCOUNTER — Encounter (HOSPITAL_COMMUNITY): Payer: Federal, State, Local not specified - PPO

## 2017-10-08 ENCOUNTER — Ambulatory Visit (HOSPITAL_COMMUNITY): Payer: Federal, State, Local not specified - PPO

## 2017-10-10 ENCOUNTER — Encounter (HOSPITAL_COMMUNITY): Payer: Federal, State, Local not specified - PPO

## 2017-10-10 ENCOUNTER — Ambulatory Visit (HOSPITAL_COMMUNITY): Payer: Federal, State, Local not specified - PPO

## 2017-10-11 ENCOUNTER — Telehealth (HOSPITAL_COMMUNITY): Payer: Self-pay | Admitting: Cardiac Rehabilitation

## 2017-10-11 ENCOUNTER — Encounter (HOSPITAL_COMMUNITY): Payer: Federal, State, Local not specified - PPO

## 2017-10-11 NOTE — Progress Notes (Signed)
Cardiac Individual Treatment Plan  Patient Details  Name: Austin Ingram MRN: [id number] Date of Birth: 05/02/1960 Referring Provider:   Flowsheet Row CARDIAC REHAB PHASE II ORIENTATION from 09/20/2017 in [facility] CARDIAC REHAB  Referring Provider  Verne Carrow MD       Initial Encounter Date:  Flowsheet Row CARDIAC REHAB PHASE II ORIENTATION from 09/20/2017 in [facility] CARDIAC REHAB  Date  09/20/17  Referring Provider  Verne Carrow MD       Visit Diagnosis: No diagnosis found.  Patient's Home Medications on Admission:  Current Outpatient Medications:  .  aliskiren (TEKTURNA) 300 MG tablet, Take 300 mg by mouth daily with breakfast. , Disp: , Rfl:  .  amLODipine (NORVASC) 10 MG tablet, TAKE 1 TABLET (10 MG TOTAL) BY MOUTH DAILY., Disp: 90 tablet, Rfl: 2 .  aspirin EC 81 MG tablet, Take 81 mg by mouth daily with breakfast., Disp: , Rfl:  .  atorvastatin (LIPITOR) 40 MG tablet, Take 1 tablet (40 mg total) daily by mouth., Disp: 30 tablet, Rfl: 2 .  citalopram (CELEXA) 20 MG tablet, Take 20 mg by mouth daily with breakfast. , Disp: , Rfl:  .  clopidogrel (PLAVIX) 75 MG tablet, TAKE 1 TABLET BY MOUTH EVERY DAY WITH BREAKFAST, Disp: 90 tablet, Rfl: 2 .  etodolac (LODINE) 400 MG tablet, Take 400 mg by mouth 2 (two) times daily as needed (FOR PAIN.). , Disp: , Rfl: 0 .  finasteride (PROSCAR) 5 MG tablet, Take 5 mg by mouth daily., Disp: , Rfl:  .  hydrochlorothiazide (HYDRODIURIL) 25 MG tablet, TAKE 1 TABLET (25 MG TOTAL) BY MOUTH DAILY., Disp: 30 tablet, Rfl: 10 .  isosorbide mononitrate (IMDUR) 30 MG 24 hr tablet, TAKE 1 TABLET (30 MG TOTAL) DAILY BY MOUTH., Disp: 90 tablet, Rfl: 1 .  nitroGLYCERIN (NITROSTAT) 0.4 MG SL tablet, PLACE 1 TABLET UNDER THE TONGUE EVERY 5 MINUTES AS NEEDED FOR CHEST PAIN., Disp: 25 tablet, Rfl: 5 .  tamsulosin (FLOMAX) 0.4 MG CAPS capsule, Take 0.4 mg by mouth daily with breakfast. , Disp: , Rfl:   Past  Medical History: Past Medical History:  Diagnosis Date  . Allergic rhinitis   . Anxiety   . Arthritis    "lower back, right thumb, knees" (10/04/2012)  . Asthma    "grew out of it" (10/04/2012)  . Coronary artery disease    a. s/p prior stent to LAD;  b. LHC 6/14: LAD stent patent, mCFX 50%, pRCA 95-99%, mRCA 75%, EF 45% with inf HK => PCI: Promus DES to pRCA  . Depression   . Hyperlipidemia   . Hypertension   . MI (myocardial infarction) (HCC) 03/2007  . OSA (obstructive sleep apnea)    mild  . Pneumonia    "as a child" (10/04/2012)    Tobacco Use: Social History   Tobacco Use  Smoking Status Former Smoker  . Packs/day: 0.50  . Years: 20.00  . Pack years: 10.00  . Types: Cigarettes  . Last attempt to quit: 12/31/1998  . Years since quitting: 18.7  Smokeless Tobacco Never Used    Labs: Recent Hydrographic surveyor    Labs for ITP Cardiac and Pulmonary Rehab Latest Ref Rng & Units 08/02/2007 12/22/2010 06/09/2011 09/03/2012 03/06/2017   Cholestrol 0 - 200 mg/dL 409 [phone]   LDLCALC 0 - 99 mg/dL 94 43 68 74 77   LDLDIRECT mg/dL - - - - -   HDL >21 mg/dL 33.2(L) 44.30 46.40  43.20 41   Trlycerides <150 mg/dL 59 161.0 96.0 45.4 86   Hemoglobin A1c 4.8 - 5.6 % - - - - 6.2(H)      Capillary Blood Glucose: No results found for: GLUCAP   Exercise Target Goals:    Exercise Program Goal: Individual exercise prescription set using results from initial 6 min walk test and THRR while considering  patient's activity barriers and safety.   Exercise Prescription Goal: Initial exercise prescription builds to 30-45 minutes a day of aerobic activity, 2-3 days per week.  Home exercise guidelines will be given to patient during program as part of exercise prescription that the participant will acknowledge.  Activity Barriers & Risk Stratification: Activity Barriers & Cardiac Risk Stratification - 09/20/17 0913    Activity Barriers & Cardiac Risk Stratification          Activity  Barriers  Arthritis;Back Problems    Cardiac Risk Stratification  Moderate           6 Minute Walk: 6 Minute Walk    6 Minute Walk    Row Name 09/20/17 1004   Phase  Initial   Distance  1843 feet   Walk Time  6 minutes   # of Rest Breaks  0   MPH  4.6   METS  4.51   RPE  11   Perceived Dyspnea   0   VO2 Peak  15.8   Symptoms  No   Resting HR  58 bpm   Resting BP  112/78   Resting Oxygen Saturation   99 %   Exercise Oxygen Saturation  during 6 min walk  99 %   Max Ex. HR  85 bpm   Max Ex. BP  140/80   2 Minute Post BP  138/70          Oxygen Initial Assessment:   Oxygen Re-Evaluation:   Oxygen Discharge (Final Oxygen Re-Evaluation):   Initial Exercise Prescription: Initial Exercise Prescription - 09/20/17 1000    Date of Initial Exercise RX and Referring Provider          Date  09/20/17    Referring Provider  Verne Carrow MD         Treadmill          MPH  3    Grade  2    Minutes  10    METs  4.12        Bike          Level  1.5    Minutes  10    METs  4.14        NuStep          Level  3    SPM  85    Minutes  10    METs  4        Prescription Details          Frequency (times per week)  2x    Duration  Progress to 30 minutes of continuous aerobic without signs/symptoms of physical distress        Intensity          THRR 40-80% of Max Heartrate  66-133    Ratings of Perceived Exertion  11-13    Perceived Dyspnea  0-4        Progression          Progression  Continue progressive overload as per policy without signs/symptoms or physical distress.        Resistance  Training          Training Prescription  Yes    Weight  5lbs    Reps  10-15           Perform Capillary Blood Glucose checks as needed.  Exercise Prescription Changes: Exercise Prescription Changes    Response to Exercise    Row Name 09/28/17 1000   Blood Pressure (Admit)  100/64   Blood Pressure (Exercise)  152/72   Blood Pressure (Exit)   116/82   Heart Rate (Admit)  64 bpm   Heart Rate (Exercise)  99 bpm   Heart Rate (Exit)  63 bpm   Rating of Perceived Exertion (Exercise)  11   Symptoms  none   Comments  pt oriented to exercise equipment   Duration  Continue with 30 min of aerobic exercise without signs/symptoms of physical distress.   Intensity  THRR unchanged       Progression    Row Name 09/28/17 1000   Progression  Continue to progress workloads to maintain intensity without signs/symptoms of physical distress.   Average METs  3.3       Resistance Training    Row Name 09/28/17 1000   Training Prescription  Yes   Weight  5lbs   Reps  10-15   Time  10 Minutes       Treadmill    Row Name 09/28/17 1000   MPH  3   Grade  2   Minutes  10   METs  4.12       Bike    Row Name 09/28/17 1000   Level  1.5   Minutes  10   METs  4.14       NuStep    Row Name 09/28/17 1000   Level  3   SPM  85   Minutes  10   METs  2.4          Exercise Comments: Exercise Comments    Row Name 09/28/17 1045   Exercise Comments  Pt responded well to first exercise session. Pt was able to exercise without sings/symptoms of physical distress. rehab staff will continue to monitor pt's safety and exercise progression.      Exercise Goals and Review: Exercise Goals    Exercise Goals    Row Name 09/20/17 0913   Increase Physical Activity  Yes   Intervention  Provide advice, education, support and counseling about physical activity/exercise needs.;Develop an individualized exercise prescription for aerobic and resistive training based on initial evaluation findings, risk stratification, comorbidities and participant's personal goals.   Expected Outcomes  Long Term: Exercising regularly at least 3-5 days a week.;Short Term: Attend rehab on a regular basis to increase amount of physical activity.;Long Term: Add in home exercise to make exercise part of routine and to increase amount of physical activity.   Increase  Strength and Stamina  Yes   Intervention  Provide advice, education, support and counseling about physical activity/exercise needs.;Develop an individualized exercise prescription for aerobic and resistive training based on initial evaluation findings, risk stratification, comorbidities and participant's personal goals.   Expected Outcomes  Short Term: Increase workloads from initial exercise prescription for resistance, speed, and METs.;Long Term: Improve cardiorespiratory fitness, muscular endurance and strength as measured by increased METs and functional capacity ( );Short Term: Perform resistance training exercises routinely during rehab and add in resistance training at home   Able to understand and use rate of perceived exertion (RPE) scale  Yes   Intervention  Provide education and explanation on how to use RPE scale   Expected Outcomes  Short Term: Able to use RPE daily in rehab to express subjective intensity level;Long Term:  Able to use RPE to guide intensity level when exercising independently   Knowledge and understanding of Target Heart Rate Range (THRR)  Yes   Intervention  Provide education and explanation of THRR including how the numbers were predicted and where they are located for reference   Expected Outcomes  Short Term: Able to state/look up THRR;Long Term: Able to use THRR to govern intensity when exercising independently;Short Term: Able to use daily as guideline for intensity in rehab   Able to check pulse independently  Yes   Intervention  Provide education and demonstration on how to check pulse in carotid and radial arteries.;Review the importance of being able to check your own pulse for safety during independent exercise   Expected Outcomes  Long Term: Able to check pulse independently and accurately;Short Term: Able to explain why pulse checking is important during independent exercise   Understanding of Exercise Prescription  Yes   Intervention  Provide education,  explanation, and written materials on patient's individual exercise prescription   Expected Outcomes  Short Term: Able to explain program exercise prescription;Long Term: Able to explain home exercise prescription to exercise independently          Exercise Goals Re-Evaluation : Exercise Goals Re-Evaluation    Exercise Goal Re-Evaluation    Row Name 10/10/17 1632   Exercise Goals Review  Increase Physical Activity;Able to understand and use rate of perceived exertion (RPE) scale   Comments  Pt demonstrated proper use of RPE scale and exercise order. Will continue to monitor activity levels   Expected Outcomes  Pt will build on cardiorespiratory fitness and muscular endurance           Discharge Exercise Prescription (Final Exercise Prescription Changes): Exercise Prescription Changes - 09/28/17 1000    Response to Exercise          Blood Pressure (Admit)  100/64    Blood Pressure (Exercise)  152/72    Blood Pressure (Exit)  116/82    Heart Rate (Admit)  64 bpm    Heart Rate (Exercise)  99 bpm    Heart Rate (Exit)  63 bpm    Rating of Perceived Exertion (Exercise)  11    Symptoms  none    Comments  pt oriented to exercise equipment    Duration  Continue with 30 min of aerobic exercise without signs/symptoms of physical distress.    Intensity  THRR unchanged        Progression          Progression  Continue to progress workloads to maintain intensity without signs/symptoms of physical distress.    Average METs  3.3        Resistance Training          Training Prescription  Yes    Weight  5lbs    Reps  10-15    Time  10 Minutes        Treadmill          MPH  3    Grade  2    Minutes  10    METs  4.12        Bike          Level  1.5    Minutes  10    METs  4.14  NuStep          Level  3    SPM  85    Minutes  10    METs  2.4           Nutrition:  Target Goals: Understanding of nutrition guidelines, daily intake of sodium 1500mg ,  cholesterol 200mg , calories 30% from fat and 7% or less from saturated fats, daily to have 5 or more servings of fruits and vegetables.  Biometrics: Pre Biometrics - 09/20/17 1033    Pre Biometrics          Height  5' 8.5" (1.74 m)    Weight  199 lb 4.7 oz (90.4 kg)    Waist Circumference  39 inches    Hip Circumference  40.25 inches    Waist to Hip Ratio  0.97 %    BMI (Calculated)  29.86    Triceps Skinfold  18 mm    % Body Fat  28.1 %    Grip Strength  42 kg    Flexibility  15 in    Single Leg Stand  30 seconds            Nutrition Therapy Plan and Nutrition Goals: Nutrition Therapy & Goals - 09/20/17 1032    Nutrition Therapy          Diet  Heart Healthy        Personal Nutrition Goals          Nutrition Goal  Pt to identify and limit food sources of saturated fat, trans fat, and sodium        Intervention Plan          Intervention  Prescribe, educate and counsel regarding individualized specific dietary modifications aiming towards targeted core components such as weight, hypertension, lipid management, diabetes, heart failure and other comorbidities.    Expected Outcomes  Short Term Goal: Understand basic principles of dietary content, such as calories, fat, sodium, cholesterol and nutrients.;Long Term Goal: Adherence to prescribed nutrition plan.           Nutrition Assessments: Nutrition Assessments - 09/20/17 1032    MEDFICTS Scores          Pre Score  79           Nutrition Goals Re-Evaluation:   Nutrition Goals Re-Evaluation:   Nutrition Goals Discharge (Final Nutrition Goals Re-Evaluation):   Psychosocial: Target Goals: Acknowledge presence or absence of significant depression and/or stress, maximize coping skills, provide positive support system. Participant is able to verbalize types and ability to use techniques and skills needed for reducing stress and depression.  Initial Review & Psychosocial Screening: Initial Psych Review &  Screening - 09/20/17 1116    Initial Review          Current issues with  Current Stress Concerns    Source of Stress Concerns  Family        Family Dynamics          Good Support System?  Yes        Barriers          Psychosocial barriers to participate in program  The patient should benefit from training in stress management and relaxation.        Screening Interventions          Interventions  Encouraged to exercise;To provide support and resources with identified psychosocial needs    Expected Outcomes  Long Term Goal: Stressors or current issues are controlled or eliminated.;Short Term goal:  Utilizing psychosocial counselor, staff and physician to assist with identification of specific Stressors or current issues interfering with healing process. Setting desired goal for each stressor or current issue identified.           Quality of Life Scores: Quality of Life - 09/20/17 1003    Quality of Life Scores          Health/Function Pre  23.6 %    Socioeconomic Pre  23 %    Psych/Spiritual Pre  30 %    Family Pre  23.5 %    GLOBAL Pre  24.73 %          Scores of 19 and below usually indicate a poorer quality of life in these areas.  A difference of  2-3 points is a clinically meaningful difference.  A difference of 2-3 points in the total score of the Quality of Life Index has been associated with significant improvement in overall quality of life, self-image, physical symptoms, and general health in studies assessing change in quality of life.  PHQ-9: Recent Review Flowsheet Data    Depression screen [facility] 2/9 09/28/2017   Decreased Interest 0   Down, Depressed, Hopeless 0   PHQ - 2 Score 0     Interpretation of Total Score  Total Score Depression Severity:  1-4 = Minimal depression, 5-9 = Mild depression, 10-14 = Moderate depression, 15-19 = Moderately severe depression, 20-27 = Severe depression   Psychosocial Evaluation and Intervention: Psychosocial Evaluation  - 09/28/17 1610    Psychosocial Evaluation & Interventions          Interventions  Encouraged to exercise with the program and follow exercise prescription;Stress management education;Relaxation education    Comments  no psychosocial needs identified, no interventions necessary. pt enjoys riding motorcycles:  [employer] and [place].  pt has trip to [place] planned soon.      Expected Outcomes  pt will exhibit positive outlook with good coping skills.            Psychosocial Re-Evaluation:   Psychosocial Discharge (Final Psychosocial Re-Evaluation):   Vocational Rehabilitation: Provide vocational rehab assistance to qualifying candidates.   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 Participant will state understanding/return demonstration of topics presented.  Learning Barriers/Preferences: Learning Barriers/Preferences - 09/20/17 0912    Learning Barriers/Preferences          Learning Barriers  Sight    Learning Preferences  Skilled Demonstration;Pictoral;Video;Written Material           Education Topics: Count Your Pulse:  -Group instruction provided by verbal instruction, demonstration, patient participation and written materials to support subject.  Instructors address importance of being able to find your pulse and how to count your pulse when at home without a heart monitor.  Patients get hands on experience counting their pulse with staff help and individually.   Heart Attack, Angina, and Risk Factor Modification:  -Group instruction provided by verbal instruction, video, and written materials to support  subject.  Instructors address signs and symptoms of angina and heart attacks.    Also discuss risk factors for  heart disease and how to make changes to improve heart health risk factors.   Functional Fitness:  -Group instruction provided by verbal instruction, demonstration, patient participation, and written materials to support subject.  Instructors address safety measures for doing things around the house.  Discuss how to get up and down off the floor, how to pick things up properly, how to safely get out of a chair without assistance, and balance training.   Meditation and Mindfulness:  -Group instruction provided by verbal instruction, patient participation, and written materials to support subject.  Instructor addresses importance of mindfulness and meditation practice to help reduce stress and improve awareness.  Instructor also leads participants through a meditation exercise.    Stretching for Flexibility and Mobility:  -Group instruction provided by verbal instruction, patient participation, and written materials to support subject.  Instructors lead participants through series of stretches that are designed to increase flexibility thus improving mobility.  These stretches are additional exercise for major muscle groups that are typically performed during regular warm up and cool down.   Hands Only CPR:  -Group verbal, video, and participation provides a basic overview of AHA guidelines for community CPR. Role-play of emergencies allow participants the opportunity to practice calling for help and chest compression technique with discussion of AED use.   Hypertension: -Group verbal and written instruction that provides a basic overview of hypertension including the most recent diagnostic guidelines, risk factor reduction with self-care instructions and medication management.    Nutrition I class: Heart Healthy Eating:  -Group instruction provided by PowerPoint slides, verbal  discussion, and written materials to support subject matter. The instructor gives an explanation and review of the Therapeutic Lifestyle Changes diet recommendations, which includes a discussion on lipid goals, dietary fat, sodium, fiber, plant stanol/sterol esters, sugar, and the components of a well-balanced, healthy diet. Flowsheet Row CARDIAC REHAB PHASE II ORIENTATION from 09/20/2017 in [facility] CARDIAC REHAB  Date  09/20/17  Educator  RD      Nutrition II class: Lifestyle Skills:  -Group instruction provided by PowerPoint slides, verbal discussion, and written materials to support subject matter. The instructor gives an explanation and review of label reading, grocery shopping for heart health, heart healthy recipe modifications, and ways to make healthier choices when eating out. Flowsheet Row CARDIAC REHAB PHASE II ORIENTATION from 09/20/2017 in [facility] CARDIAC REHAB  Date  09/20/17  Educator  RD      Diabetes Question & Answer:  -Group instruction provided by PowerPoint slides, verbal discussion, and written materials to support subject matter. The instructor gives an explanation and review of diabetes co-morbidities, pre- and post-prandial blood glucose goals, pre-exercise blood glucose goals, signs, symptoms, and treatment of hypoglycemia and hyperglycemia, and foot care basics.   Diabetes Blitz:  -Group instruction provided by PowerPoint slides, verbal discussion, and written materials to support subject matter. The instructor gives an explanation and review of the physiology behind type 1 and type 2 diabetes, diabetes medications and rational behind using different medications, pre- and post-prandial blood glucose recommendations and Hemoglobin A1c goals, diabetes diet, and exercise including blood glucose guidelines for exercising safely.    Portion Distortion:  -Group instruction provided by PowerPoint slides, verbal discussion, written  materials, and food models to support subject matter. The instructor gives an explanation of serving size versus portion size, changes in portions sizes over the last 20 years, and what consists of a serving from each food group.   Stress Management:  -Group  instruction provided by verbal instruction, video, and written materials to support subject matter.  Instructors review role of stress in heart disease and how to cope with stress positively.     Exercising on Your Own:  -Group instruction provided by verbal instruction, power point, and written materials to support subject.  Instructors discuss benefits of exercise, components of exercise, frequency and intensity of exercise, and end points for exercise.  Also discuss use of nitroglycerin and activating EMS.  Review options of places to exercise outside of rehab.  Review guidelines for sex with heart disease.   Cardiac Drugs I:  -Group instruction provided by verbal instruction and written materials to support subject.  Instructor reviews cardiac drug classes: antiplatelets, anticoagulants, beta blockers, and statins.  Instructor discusses reasons, side effects, and lifestyle considerations for each drug class.   Cardiac Drugs II:  -Group instruction provided by verbal instruction and written materials to support subject.  Instructor reviews cardiac drug classes: angiotensin converting enzyme inhibitors (ACE-I), angiotensin II receptor blockers (ARBs), nitrates, and calcium channel blockers.  Instructor discusses reasons, side effects, and lifestyle considerations for each drug class.   Anatomy and Physiology of the Circulatory System:  Group verbal and written instruction and models provide basic cardiac anatomy and physiology, with the coronary electrical and arterial systems. Review of: AMI, Angina, Valve disease, Heart Failure, Peripheral Artery Disease, Cardiac Arrhythmia, Pacemakers, and the ICD.   Other Education:  -Group or  individual verbal, written, or video instructions that support the educational goals of the cardiac rehab program.   Holiday Eating Survival Tips:  -Group instruction provided by PowerPoint slides, verbal discussion, and written materials to support subject matter. The instructor gives patients tips, tricks, and techniques to help them not only survive but enjoy the holidays despite the onslaught of food that accompanies the holidays.   Knowledge Questionnaire Score: Knowledge Questionnaire Score - 09/20/17 1035    Knowledge Questionnaire Score          Pre Score  19/24           Core Components/Risk Factors/Patient Goals at Admission: Personal Goals and Risk Factors at Admission - 09/20/17 1036    Core Components/Risk Factors/Patient Goals on Admission           Weight Management  Yes;Weight Loss    Intervention  Weight Management: Develop a combined nutrition and exercise program designed to reach desired caloric intake, while maintaining appropriate intake of nutrient and fiber, sodium and fats, and appropriate energy expenditure required for the weight goal.;Weight Management: Provide education and appropriate resources to help participant work on and attain dietary goals.;Weight Management/Obesity: Establish reasonable short term and long term weight goals.    Admit Weight  199 lb 4.7 oz (90.4 kg)    Goal Weight: Short Term  195 lb (88.5 kg)    Goal Weight: Long Term  190 lb (86.2 kg)    Expected Outcomes  Long Term: Adherence to nutrition and physical activity/exercise program aimed toward attainment of established weight goal;Weight Loss: Understanding of general recommendations for a balanced deficit meal plan, which promotes 1-2 lb weight loss per week and includes a negative energy balance of [phone] kcal/d;Short Term: Continue to assess and modify interventions until short term weight is achieved;Understanding recommendations for meals to include 15-35% energy as protein, 25-35%  energy from fat, 35-60% energy from carbohydrates, less than 200mg  of dietary cholesterol, 20-35 gm of total fiber daily;Understanding of distribution of calorie intake throughout the day with the consumption of 4-5  meals/snacks    Hypertension  Yes    Intervention  Monitor prescription use compliance.;Provide education on lifestyle modifcations including regular physical activity/exercise, weight management, moderate sodium restriction and increased consumption of fresh fruit, vegetables, and low fat dairy, alcohol moderation, and smoking cessation.    Expected Outcomes  Short Term: Continued assessment and intervention until BP is < 140/17mm HG in hypertensive participants. < 130/89mm HG in hypertensive participants with diabetes, heart failure or chronic kidney disease.;Long Term: Maintenance of blood pressure at goal levels.    Lipids  Yes    Intervention  Provide education and support for participant on nutrition & aerobic/resistive exercise along with prescribed medications to achieve LDL 70mg , HDL >40mg .    Expected Outcomes  Short Term: Participant states understanding of desired cholesterol values and is compliant with medications prescribed. Participant is following exercise prescription and nutrition guidelines.;Long Term: Cholesterol controlled with medications as prescribed, with individualized exercise RX and with personalized nutrition plan. Value goals: LDL < 70mg , HDL > 40 mg.    Stress  Yes    Intervention  Offer individual and/or small group education and counseling on adjustment to heart disease, stress management and health-related lifestyle change. Teach and support self-help strategies.;Refer participants experiencing significant psychosocial distress to appropriate mental health specialists for further evaluation and treatment. When possible, include family members and significant others in education/counseling sessions.    Expected Outcomes  Short Term: Participant demonstrates  changes in health-related behavior, relaxation and other stress management skills, ability to obtain effective social support, and compliance with psychotropic medications if prescribed.;Long Term: Emotional wellbeing is indicated by absence of clinically significant psychosocial distress or social isolation.           Core Components/Risk Factors/Patient Goals Review:  Goals and Risk Factor Review    Core Components/Risk Factors/Patient Goals Review    Row Name 09/28/17 0940   Personal Goals Review  Hypertension;Lipids;Stress   Review  pt with multiple CAD RF demonstrates eagerness to participate in CR program.  pt goals are to increase strength/stamina by increasing aerobic activity.  pt would like to become a runner and use his gym membership.     Expected Outcomes  pt will participate in CR exercise, nutrition and lifestyle modification opportunities.            Core Components/Risk Factors/Patient Goals at Discharge (Final Review):  Goals and Risk Factor Review - 09/28/17 0940    Core Components/Risk Factors/Patient Goals Review          Personal Goals Review  Hypertension;Lipids;Stress    Review  pt with multiple CAD RF demonstrates eagerness to participate in CR program.  pt goals are to increase strength/stamina by increasing aerobic activity.  pt would like to become a runner and use his gym membership.      Expected Outcomes  pt will participate in CR exercise, nutrition and lifestyle modification opportunities.             ITP Comments: ITP Comments    Row Name 09/20/17 0911 09/28/17 0938   ITP Comments  Dr. Armanda Magic, Medical Director  pt started group exercise program. pt demonstrates eagerness to participate.   pt oriented to equipment and safety routine.       Comments:

## 2017-10-11 NOTE — Telephone Encounter (Signed)
pc to pt to assess reason for continued absence from cardiac rehab.  LMOM  Deveron Furlong, RN, BSN Cardiac Pulmonary Rehab 10/11/17 1:50 PM

## 2017-10-12 ENCOUNTER — Encounter (HOSPITAL_COMMUNITY)
Admission: RE | Admit: 2017-10-12 | Discharge: 2017-10-12 | Disposition: A | Discharge status: Home / Self Care | Payer: Federal, State, Local not specified - PPO | Source: Ambulatory Visit | Attending: Cardiovascular Disease | Admitting: Cardiovascular Disease

## 2017-10-12 ENCOUNTER — Ambulatory Visit (HOSPITAL_COMMUNITY): Payer: Federal, State, Local not specified - PPO

## 2017-10-12 DIAGNOSIS — Z955 Presence of coronary angioplasty implant and graft: Secondary | ICD-10-CM | POA: Insufficient documentation

## 2017-10-15 ENCOUNTER — Ambulatory Visit (HOSPITAL_COMMUNITY): Payer: Federal, State, Local not specified - PPO

## 2017-10-15 ENCOUNTER — Encounter (HOSPITAL_COMMUNITY): Payer: Federal, State, Local not specified - PPO

## 2017-10-17 ENCOUNTER — Encounter (HOSPITAL_COMMUNITY): Payer: Federal, State, Local not specified - PPO

## 2017-10-17 ENCOUNTER — Ambulatory Visit (HOSPITAL_COMMUNITY): Payer: Federal, State, Local not specified - PPO

## 2017-10-18 ENCOUNTER — Encounter (HOSPITAL_COMMUNITY): Payer: Federal, State, Local not specified - PPO

## 2017-10-19 ENCOUNTER — Encounter (HOSPITAL_COMMUNITY): Payer: Federal, State, Local not specified - PPO

## 2017-10-19 ENCOUNTER — Ambulatory Visit (HOSPITAL_COMMUNITY): Payer: Federal, State, Local not specified - PPO

## 2017-10-22 ENCOUNTER — Ambulatory Visit (HOSPITAL_COMMUNITY): Payer: Federal, State, Local not specified - PPO

## 2017-10-22 ENCOUNTER — Encounter (HOSPITAL_COMMUNITY): Payer: Federal, State, Local not specified - PPO

## 2017-10-24 ENCOUNTER — Encounter (HOSPITAL_COMMUNITY): Payer: Federal, State, Local not specified - PPO

## 2017-10-24 ENCOUNTER — Ambulatory Visit (HOSPITAL_COMMUNITY): Payer: Federal, State, Local not specified - PPO

## 2017-10-25 ENCOUNTER — Encounter: Payer: Self-pay | Admitting: Cardiovascular Disease

## 2017-10-25 ENCOUNTER — Encounter (INDEPENDENT_AMBULATORY_CARE_PROVIDER_SITE_OTHER): Payer: Self-pay

## 2017-10-25 ENCOUNTER — Ambulatory Visit: Payer: Federal, State, Local not specified - PPO | Admitting: Cardiovascular Disease

## 2017-10-25 ENCOUNTER — Encounter (HOSPITAL_COMMUNITY): Payer: Federal, State, Local not specified - PPO

## 2017-10-25 VITALS — BP 122/82 | HR 61 | Ht 68.5 in | Wt 204.8 lb

## 2017-10-25 DIAGNOSIS — I25118 Atherosclerotic heart disease of native coronary artery with other forms of angina pectoris: Secondary | ICD-10-CM

## 2017-10-25 DIAGNOSIS — I10 Essential (primary) hypertension: Secondary | ICD-10-CM

## 2017-10-25 DIAGNOSIS — I255 Ischemic cardiomyopathy: Secondary | ICD-10-CM

## 2017-10-25 DIAGNOSIS — E7800 Pure hypercholesterolemia, unspecified: Secondary | ICD-10-CM

## 2017-10-25 MED ORDER — ISOSORBIDE MONONITRATE ER 30 MG PO TB24: 30.0000 mg | ORAL_TABLET | Freq: Every day | ORAL | 3 refills | Status: DC

## 2017-10-25 NOTE — Patient Instructions (Signed)

## 2017-10-25 NOTE — Progress Notes (Signed)
Chief Complaint  Patient presents with  . Follow-up    CAD     History of Present Illness: 57 yo male with history of CAD, ischemic cardiomyopathy, HTN, HLD who is here today for cardiac follow up. He had a bare metal stent placed in the LAD in 2008. Cardiac cath in June 2014 and he had patent LAD stents, 50% mid Circumflex stenosis and severe disease in the RCA. A drug eluting stent was placed in the proximal RCA. Echo in 2009 with LVEF=35-40%, periapical AK, ant HK, mild LAE. He did not tolerate Ace-inhibitors due to cough. He is not on a beta blocker due to bradycardia. He did not tolerate ARB secondary to dizziness. I saw him 06/05/13 and he c/o SOB and sharp left sided chest pains as well as fatigue. I arranged a stress myoview on 06/24/13 which did not show ischemia, LVEF=34%. I saw him on 02/01/17 and he had c/o chest pain and dyspnea on exertion. Nuclear stress test 02/16/17 with scar in the anteroseptal and apical walls with ischemia noted. Cardiac cath 02/26/17 with chronic occlusion RCA, patent proximal LAD stent and severe stenosis distal Circumflex artery. A drug eluting stent was placed in the distal Circumflex artery. He was discharged on ASA and Plavix. Readmitted 03/06/17 with chest pain and ruled out for MI with serial cardiac enzymes. Imdur was added but he stopped due to headaches.   He is here today for follow up. The patient denies any chest pain, dyspnea, palpitations, lower extremity edema, orthopnea, PND, dizziness, near syncope or syncope.   Primary Care Physician: Clovis Riley, L.[name], MD   Past Medical History:  Diagnosis Date  . Allergic rhinitis   . Anxiety   . Arthritis    "lower back, right thumb, knees" (10/04/2012)  . Asthma    "grew out of it" (10/04/2012)  . Coronary artery disease    a. s/p prior stent to LAD;  b. LHC 6/14: LAD stent patent, mCFX 50%, pRCA 95-99%, mRCA 75%, EF 45% with inf HK => PCI: Promus DES to pRCA  . Depression   . Hyperlipidemia   .  Hypertension   . MI (myocardial infarction) (HCC) 03/2007  . OSA (obstructive sleep apnea)    mild  . Pneumonia    "as a child" (10/04/2012)    Past Surgical History:  Procedure Laterality Date  . ARTHROPLASTY Right 1993   'crushed; removed bone fragments" (10/04/2012)  . CARDIAC CATHETERIZATION  2010  . CORONARY ANGIOPLASTY WITH STENT PLACEMENT  2008; 10/04/2012   "1 + 1" (10/04/2012)  . CORONARY STENT INTERVENTION N/A 02/26/2017   Procedure: CORONARY STENT INTERVENTION;  Surgeon: Kathleene Hazel, MD;  Location: [facility];  Service: Cardiovascular;  Laterality: N/A;  . EXPLORATORY LAPAROTOMY  1990's?   "went in to repair hernia; found fatty tissue instead; no hernia repair" (10/04/2012)  . INTRAVASCULAR PRESSURE WIRE/FFR STUDY N/A 02/26/2017   Procedure: INTRAVASCULAR PRESSURE WIRE/FFR STUDY;  Surgeon: Kathleene Hazel, MD;  Location: [facility];  Service: Cardiovascular;  Laterality: N/A;  . LEFT HEART CATH AND CORONARY ANGIOGRAPHY N/A 02/26/2017   Procedure: LEFT HEART CATH AND CORONARY ANGIOGRAPHY;  Surgeon: Kathleene Hazel, MD;  Location: [facility];  Service: Cardiovascular;  Laterality: N/A;  . LEFT HEART CATHETERIZATION WITH CORONARY ANGIOGRAM N/A 10/04/2012   Procedure: LEFT HEART CATHETERIZATION WITH CORONARY ANGIOGRAM;  Surgeon: Tonny Bollman, MD;  Location: [facility] CATH LAB;  Service: Cardiovascular;  Laterality: N/A;    Current Outpatient  Medications  Medication Sig Dispense Refill  . aliskiren (TEKTURNA) 300 MG tablet Take 300 mg by mouth daily with breakfast.     . amLODipine (NORVASC) 10 MG tablet TAKE 1 TABLET (10 MG TOTAL) BY MOUTH DAILY. 90 tablet 2  . aspirin EC 81 MG tablet Take 81 mg by mouth daily with breakfast.    . atorvastatin (LIPITOR) 40 MG tablet Take 1 tablet (40 mg total) daily by mouth. 30 tablet 2  . citalopram (CELEXA) 20 MG tablet Take 20 mg by mouth daily with breakfast.     . clopidogrel (PLAVIX) 75 MG tablet TAKE  1 TABLET BY MOUTH EVERY DAY WITH BREAKFAST 90 tablet 2  . etodolac (LODINE) 400 MG tablet Take 400 mg by mouth 2 (two) times daily as needed (FOR PAIN.).   0  . finasteride (PROSCAR) 5 MG tablet Take 5 mg by mouth daily.    . hydrochlorothiazide (HYDRODIURIL) 25 MG tablet TAKE 1 TABLET (25 MG TOTAL) BY MOUTH DAILY. 30 tablet 10  . isosorbide mononitrate (IMDUR) 30 MG 24 hr tablet Take 1 tablet (30 mg total) by mouth daily. 90 tablet 3  . nitroGLYCERIN (NITROSTAT) 0.4 MG SL tablet PLACE 1 TABLET UNDER THE TONGUE EVERY 5 MINUTES AS NEEDED FOR CHEST PAIN. 25 tablet 5  . tamsulosin (FLOMAX) 0.4 MG CAPS capsule Take 0.4 mg by mouth daily with breakfast.      No current facility-administered medications for this visit.     Allergies  Allergen Reactions  . Adhesive [Tape] Other (See Comments)    SKIN SCARRING/IRRITATION. PAPER TAPE IS OKAY    Social History   Socioeconomic History  . Marital status: Married    Spouse name: Not on file  . Number of children: 1  . Years of education: Not on file  . Highest education level: Not on file  Occupational History    Employer: [employer]  Social Needs  . Financial resource strain: Not on file  . Food insecurity:    Worry: Not on file    Inability: Not on file  . Transportation needs:    Medical: Not on file    Non-medical: Not on file  Tobacco Use  . Smoking status: Former Smoker    Packs/day: 0.50    Years: 20.00    Pack years: 10.00    Types: Cigarettes    Last attempt to quit: 12/31/1998    Years since quitting: 18.8  . Smokeless tobacco: Never Used  Substance and Sexual Activity  . Alcohol use: Yes    Comment: 10/04/2012 "1 beer plus 2 mixed drinks once/month"  . Drug use: Yes    Frequency: 14.0 times per week    Types: Marijuana    Comment: smokes 2 joints/day  . Sexual activity: Yes  Lifestyle  . Physical activity:    Days per week: 0 days    Minutes per session: 0 min  . Stress: Very much  Relationships  . Social  connections:    Talks on phone: Not on file    Gets together: Not on file    Attends religious service: Not on file    Active member of club or organization: Not on file    Attends meetings of clubs or organizations: Not on file    Relationship status: Not on file  . Intimate partner violence:    Fear of current or ex partner: Not on file    Emotionally abused: Not on file    Physically abused: Not  on file    Forced sexual activity: Not on file  Other Topics Concern  . Not on file  Social History Narrative  . Not on file    Family History  Problem Relation Age of Onset  . Atrial fibrillation Mother        irregular heart beats  . Hypertension Mother   . Diabetes type II Mother   . CAD Maternal Grandfather   . CAD Maternal Grandmother     Review of Systems:  As stated in the HPI and otherwise negative.   BP 122/82   Pulse 61   Ht 5' 8.5" (1.74 m)   Wt 204 lb 12.8 oz (92.9 kg)   SpO2 97%   BMI 30.69 kg/m   Physical Examination:  General: Well developed, well nourished, NAD  HEENT: OP clear, mucus membranes moist  SKIN: warm, dry. No rashes. Neuro: No focal deficits  Musculoskeletal: Muscle strength 5/5 all ext  Psychiatric: Mood and affect normal  Neck: No JVD, no carotid bruits, no thyromegaly, no lymphadenopathy.  Lungs:Clear bilaterally, no wheezes, rhonci, crackles Cardiovascular: Regular rate and rhythm. No murmurs, gallops or rubs. Abdomen:Soft. Bowel sounds present. Non-tender.  Extremities: No lower extremity edema. Pulses are 2 + in the bilateral DP/PT.  Echo 02/16/17: - Left ventricle: The cavity size was normal. There was mild focal   basal hypertrophy of the septum. Systolic function was moderately   reduced. The estimated ejection fraction was in the range of 35%   to 40%. Global hypokinesis with apical dyskinesis. Features are   consistent with a pseudonormal left ventricular filling pattern,   with concomitant abnormal relaxation and increased  filling   pressure (grade 2 diastolic dysfunction). Doppler parameters are   consistent with indeterminate ventricular filling pressure. - Aortic valve: Transvalvular velocity was within the normal range.   There was no stenosis. There was trivial regurgitation. - Left atrium: The atrium was mildly dilated. - Right ventricle: The cavity size was normal. Wall thickness was   normal. Systolic function was normal. - Tricuspid valve: There was mild regurgitation. - Pulmonary arteries: Systolic pressure was within the normal   range. PA peak pressure: 30 mm Hg (S).  Cardiac cath 02/26/17:  Ost RCA to Prox RCA lesion, 40 %stenosed.  Prox RCA lesion, 60 %stenosed.  Mid RCA lesion, 100 %stenosed.  1st Mrg lesion, 30 %stenosed.  Mid Cx lesion, 75 %stenosed.  Ost LAD to Prox LAD lesion, 20 %stenosed.  1st Diag lesion, 60 %stenosed.   1. Triple vessel CAD 2. Patent stent proximal LAD with minimal in stent restenosis 3. Severe stenosis distal Circumflex artery 4. Successful PTCA/DES x 1 distal Circumflex 5. Patent proximal RCA stent followed by chronic total occlusion of RCA in the mid segment. The distal RCA fills from left to right collaterals.   Diagnostic Diagram      PCI of the distal Circumflex was peformed. Xience Sierra 2.75 x 12 mm stent placed.   EKG:  EKG is not ordered today. The ekg ordered today demonstrates   Recent Labs: 03/07/2017: BUN 18; Creatinine, Ser 1.16; Hemoglobin 12.1; Platelets 183; Potassium 3.8; Sodium 140   Lipid Panel Followed in primary care   Wt Readings from Last 3 Encounters:  10/25/17 204 lb 12.8 oz (92.9 kg)  09/20/17 199 lb 4.7 oz (90.4 kg)  03/09/17 202 lb (91.6 kg)     Other studies Reviewed: Additional studies/ records that were reviewed today include: . Review of the above records demonstrates:   Assessment  and Plan:   1. CAD with chronic stable angina: Rare chest pains. He has not tolerated beta blockers, ARBs, Ace-inh. He is  on ASA and Plavix. IMdur. Will continue ASA, Plavix until October 2019, then will stop Plavix. Will continue Norvasc and Imdur. (He does not tolerate beta blockers due to bradycardia).   2. HTN: BP is well controlled.   3. HLD: Lipids followed in primary care. Continue statin  4. Ischemic cardiomyopathy: LVEF=35-40% by echo 02/16/17. He does not tolerate medical therapy which would be indicated (beta blockers/Ace-inh/ARB).   This did not include the time spent discussing his cardiac disease.   Current medicines are reviewed at length with the patient today.  The patient does not have concerns regarding medicines.  The following changes have been made:  no change  Labs/ tests ordered today include:   No orders of the defined types were placed in this encounter.   Disposition:   FU with me in 12 months.   Signed, Verne Carrow, MD 10/25/2017 11:14 AM    [facility] [address] [place], [place], [place]  [address] Phone: [phone]; Fax: [phone]

## 2017-10-26 ENCOUNTER — Ambulatory Visit (HOSPITAL_COMMUNITY): Payer: Federal, State, Local not specified - PPO

## 2017-10-26 ENCOUNTER — Encounter (HOSPITAL_COMMUNITY): Payer: Federal, State, Local not specified - PPO

## 2017-10-29 ENCOUNTER — Encounter (HOSPITAL_COMMUNITY): Payer: Federal, State, Local not specified - PPO

## 2017-10-29 ENCOUNTER — Ambulatory Visit (HOSPITAL_COMMUNITY): Payer: Federal, State, Local not specified - PPO

## 2017-10-31 ENCOUNTER — Encounter (HOSPITAL_COMMUNITY): Payer: Federal, State, Local not specified - PPO

## 2017-10-31 ENCOUNTER — Ambulatory Visit (HOSPITAL_COMMUNITY): Payer: Federal, State, Local not specified - PPO

## 2017-10-31 NOTE — Addendum Note (Signed)
Encounter addended by: Warrick Parisian D on: 10/31/2017 5:35 PM  Actions taken: Flowsheet data copied forward, Visit Navigator Flowsheet section accepted

## 2017-11-02 ENCOUNTER — Ambulatory Visit (HOSPITAL_COMMUNITY): Payer: Federal, State, Local not specified - PPO

## 2017-11-02 ENCOUNTER — Encounter (HOSPITAL_COMMUNITY): Payer: Federal, State, Local not specified - PPO

## 2017-11-02 NOTE — Addendum Note (Signed)
Encounter addended by: Enid Skeens, RD on: 11/02/2017 10:15 AM  Actions taken: Flowsheet data copied forward, Visit Navigator Flowsheet section accepted

## 2017-11-05 ENCOUNTER — Ambulatory Visit (HOSPITAL_COMMUNITY): Payer: Federal, State, Local not specified - PPO

## 2017-11-05 ENCOUNTER — Encounter (HOSPITAL_COMMUNITY): Payer: Federal, State, Local not specified - PPO

## 2017-11-07 ENCOUNTER — Ambulatory Visit (HOSPITAL_COMMUNITY): Payer: Federal, State, Local not specified - PPO

## 2017-11-07 ENCOUNTER — Encounter (HOSPITAL_COMMUNITY): Payer: Federal, State, Local not specified - PPO

## 2017-11-08 NOTE — Progress Notes (Signed)
Discharge Progress Report  Patient Details  Name: Austin Ingram MRN: [id number] Date of Birth: [birth year] Referring Provider:   Flowsheet Row CARDIAC REHAB PHASE II ORIENTATION from 09/20/2017 in [facility] CARDIAC [facility]  Referring Provider  Verne Carrow MD        Number of Visits: 2   Reason for Discharge:  Early Exit:  Lack of attendance  Smoking History:  Social History   Tobacco Use  Smoking Status Former Smoker  . Packs/day: 0.50  . Years: 20.00  . Pack years: 10.00  . Types: Cigarettes  . Last attempt to quit: 12/31/1998  . Years since quitting: 18.8  Smokeless Tobacco Never Used    Diagnosis:  Status post coronary artery stent placement 02/26/17 Distal circumflex  ADL UCSD:   Initial Exercise Prescription: Initial Exercise Prescription - 09/20/17 1000    Date of Initial Exercise RX and Referring Provider          Date  09/20/17    Referring Provider  Verne Carrow MD         Treadmill          MPH  3    Grade  2    Minutes  10    METs  4.12        Bike          Level  1.5    Minutes  10    METs  4.14        NuStep          Level  3    SPM  85    Minutes  10    METs  4        Prescription Details          Frequency (times per week)  2x    Duration  Progress to 30 minutes of continuous aerobic without signs/symptoms of physical distress        Intensity          THRR 40-80% of Max Heartrate  66-133    Ratings of Perceived Exertion  11-13    Perceived Dyspnea  0-4        Progression          Progression  Continue progressive overload as per policy without signs/symptoms or physical distress.        Resistance Training          Training Prescription  Yes    Weight  5lbs    Reps  10-15           Discharge Exercise Prescription (Final Exercise Prescription Changes): Exercise Prescription Changes - 09/28/17 1000    Response to Exercise          Blood Pressure (Admit)  100/64    Blood  Pressure (Exercise)  152/72    Blood Pressure (Exit)  116/82    Heart Rate (Admit)  64 bpm    Heart Rate (Exercise)  99 bpm    Heart Rate (Exit)  63 bpm    Rating of Perceived Exertion (Exercise)  11    Symptoms  none    Comments  pt oriented to exercise equipment    Duration  Continue with 30 min of aerobic exercise without signs/symptoms of physical distress.    Intensity  THRR unchanged        Progression          Progression  Continue to progress workloads to maintain intensity without signs/symptoms of physical distress.  Average METs  3.3        Resistance Training          Training Prescription  Yes    Weight  5lbs    Reps  10-15    Time  10 Minutes        Treadmill          MPH  3    Grade  2    Minutes  10    METs  4.12        Bike          Level  1.5    Minutes  10    METs  4.14        NuStep          Level  3    SPM  85    Minutes  10    METs  2.4           Functional Capacity: 6 Minute Walk    6 Minute Walk    Row Name 09/20/17 1004   Phase  Initial   Distance  1843 feet   Walk Time  6 minutes   # of Rest Breaks  0   MPH  4.6   METS  4.51   RPE  11   Perceived Dyspnea   0   VO2 Peak  15.8   Symptoms  No   Resting HR  58 bpm   Resting BP  112/78   Resting Oxygen Saturation   99 %   Exercise Oxygen Saturation  during 6 min walk  99 %   Max Ex. HR  85 bpm   Max Ex. BP  140/80   2 Minute Post BP  138/70          Psychological, QOL, Others - Outcomes: PHQ 2/9: Depression screen PHQ 2/9 09/28/2017  Decreased Interest 0  Down, Depressed, Hopeless 0  PHQ - 2 Score 0    Quality of Life: Quality of Life - 09/20/17 1003    Quality of Life Scores          Health/Function Pre  23.6 %    Socioeconomic Pre  23 %    Psych/Spiritual Pre  30 %    Family Pre  23.5 %    GLOBAL Pre  24.73 %           Personal Goals: Goals established at orientation with interventions provided to work toward goal. Personal Goals and Risk  Factors at Admission - 09/20/17 1036    Core Components/Risk Factors/Patient Goals on Admission           Weight Management  Yes;Weight Loss    Intervention  Weight Management: Develop a combined nutrition and exercise program designed to reach desired caloric intake, while maintaining appropriate intake of nutrient and fiber, sodium and fats, and appropriate energy expenditure required for the weight goal.;Weight Management: Provide education and appropriate resources to help participant work on and attain dietary goals.;Weight Management/Obesity: Establish reasonable short term and long term weight goals.    Admit Weight  199 lb 4.7 oz (90.4 kg)    Goal Weight: Short Term  195 lb (88.5 kg)    Goal Weight: Long Term  190 lb (86.2 kg)    Expected Outcomes  Long Term: Adherence to nutrition and physical activity/exercise program aimed toward attainment of established weight goal;Weight Loss: Understanding of general recommendations for a balanced deficit meal plan, which promotes 1-2 lb weight loss per week and  includes a negative energy balance of [phone] kcal/d;Short Term: Continue to assess and modify interventions until short term weight is achieved;Understanding recommendations for meals to include 15-35% energy as protein, 25-35% energy from fat, 35-60% energy from carbohydrates, less than 200mg  of dietary cholesterol, 20-35 gm of total fiber daily;Understanding of distribution of calorie intake throughout the day with the consumption of 4-5 meals/snacks    Hypertension  Yes    Intervention  Monitor prescription use compliance.;Provide education on lifestyle modifcations including regular physical activity/exercise, weight management, moderate sodium restriction and increased consumption of fresh fruit, vegetables, and low fat dairy, alcohol moderation, and smoking cessation.    Expected Outcomes  Short Term: Continued assessment and intervention until BP is < 140/49mm HG in hypertensive  participants. < 130/56mm HG in hypertensive participants with diabetes, heart failure or chronic kidney disease.;Long Term: Maintenance of blood pressure at goal levels.    Lipids  Yes    Intervention  Provide education and support for participant on nutrition & aerobic/resistive exercise along with prescribed medications to achieve LDL 70mg , HDL >40mg .    Expected Outcomes  Short Term: Participant states understanding of desired cholesterol values and is compliant with medications prescribed. Participant is following exercise prescription and nutrition guidelines.;Long Term: Cholesterol controlled with medications as prescribed, with individualized exercise RX and with personalized nutrition plan. Value goals: LDL < 70mg , HDL > 40 mg.    Stress  Yes    Intervention  Offer individual and/or small group education and counseling on adjustment to heart disease, stress management and health-related lifestyle change. Teach and support self-help strategies.;Refer participants experiencing significant psychosocial distress to appropriate mental health specialists for further evaluation and treatment. When possible, include family members and significant others in education/counseling sessions.    Expected Outcomes  Short Term: Participant demonstrates changes in health-related behavior, relaxation and other stress management skills, ability to obtain effective social support, and compliance with psychotropic medications if prescribed.;Long Term: Emotional wellbeing is indicated by absence of clinically significant psychosocial distress or social isolation.            Personal Goals Discharge: Goals and Risk Factor Review    Core Components/Risk Factors/Patient Goals Review    Row Name 09/28/17 0940 11/08/17 1637   Personal Goals Review  Hypertension;Lipids;Stress  no documentation   Review  pt with multiple CAD RF demonstrates eagerness to participate in CR program.  pt goals are to increase  strength/stamina by increasing aerobic activity.  pt would like to become a runner and use his gym membership.    pt dropped from CR program for non attendance. pt attended 2 sessions.    Expected Outcomes  pt will participate in CR exercise, nutrition and lifestyle modification opportunities.    pt will participate in CR exercise, nutrition and lifestyle modification opportunities.            Exercise Goals and Review: Exercise Goals    Exercise Goals    Row Name 09/20/17 0913   Increase Physical Activity  Yes   Intervention  Provide advice, education, support and counseling about physical activity/exercise needs.;Develop an individualized exercise prescription for aerobic and resistive training based on initial evaluation findings, risk stratification, comorbidities and participant's personal goals.   Expected Outcomes  Long Term: Exercising regularly at least 3-5 days a week.;Short Term: Attend rehab on a regular basis to increase amount of physical activity.;Long Term: Add in home exercise to make exercise part of routine and to increase amount of physical activity.   Increase  Strength and Stamina  Yes   Intervention  Provide advice, education, support and counseling about physical activity/exercise needs.;Develop an individualized exercise prescription for aerobic and resistive training based on initial evaluation findings, risk stratification, comorbidities and participant's personal goals.   Expected Outcomes  Short Term: Increase workloads from initial exercise prescription for resistance, speed, and METs.;Long Term: Improve cardiorespiratory fitness, muscular endurance and strength as measured by increased METs and functional capacity ( );Short Term: Perform resistance training exercises routinely during rehab and add in resistance training at home   Able to understand and use rate of perceived exertion (RPE) scale  Yes   Intervention  Provide education and explanation on how to use RPE  scale   Expected Outcomes  Short Term: Able to use RPE daily in rehab to express subjective intensity level;Long Term:  Able to use RPE to guide intensity level when exercising independently   Knowledge and understanding of Target Heart Rate Range (THRR)  Yes   Intervention  Provide education and explanation of THRR including how the numbers were predicted and where they are located for reference   Expected Outcomes  Short Term: Able to state/look up THRR;Long Term: Able to use THRR to govern intensity when exercising independently;Short Term: Able to use daily as guideline for intensity in rehab   Able to check pulse independently  Yes   Intervention  Provide education and demonstration on how to check pulse in carotid and radial arteries.;Review the importance of being able to check your own pulse for safety during independent exercise   Expected Outcomes  Long Term: Able to check pulse independently and accurately;Short Term: Able to explain why pulse checking is important during independent exercise   Understanding of Exercise Prescription  Yes   Intervention  Provide education, explanation, and written materials on patient's individual exercise prescription   Expected Outcomes  Short Term: Able to explain program exercise prescription;Long Term: Able to explain home exercise prescription to exercise independently          Nutrition & Weight - Outcomes: Pre Biometrics - 09/20/17 1033    Pre Biometrics          Height  5' 8.5" (1.74 m)    Weight  199 lb 4.7 oz (90.4 kg)    Waist Circumference  39 inches    Hip Circumference  40.25 inches    Waist to Hip Ratio  0.97 %    BMI (Calculated)  29.86    Triceps Skinfold  18 mm    % Body Fat  28.1 %    Grip Strength  42 kg    Flexibility  15 in    Single Leg Stand  30 seconds          Post Biometrics - 10/31/17 1724     Post  Biometrics          Weight  201 lb 4.5 oz (91.3 kg)           Nutrition: Nutrition Therapy & Goals -  09/20/17 1032    Nutrition Therapy          Diet  Heart Healthy        Personal Nutrition Goals          Nutrition Goal  Pt to identify and limit food sources of saturated fat, trans fat, and sodium        Intervention Plan          Intervention  Prescribe, educate and counsel regarding individualized specific dietary modifications  aiming towards targeted core components such as weight, hypertension, lipid management, diabetes, heart failure and other comorbidities.    Expected Outcomes  Short Term Goal: Understand basic principles of dietary content, such as calories, fat, sodium, cholesterol and nutrients.;Long Term Goal: Adherence to prescribed nutrition plan.           Nutrition Discharge: Nutrition Assessments - 11/02/17 1012    MEDFICTS Scores          Pre Score  79    Post Score  -- survey not available to assess at this time           Education Questionnaire Score: Knowledge Questionnaire Score - 09/20/17 1035    Knowledge Questionnaire Score          Pre Score  19/24           Goals reviewed with patient; copy given to patient.

## 2017-11-08 NOTE — Addendum Note (Signed)
Encounter addended by: Robyne Peers, RN on: 11/08/2017 4:39 PM  Actions taken: Episode resolved

## 2017-11-08 NOTE — Addendum Note (Signed)
Encounter addended by: Robyne Peers, RN on: 11/08/2017 4:38 PM  Actions taken: Flowsheet data copied forward, Visit Navigator Flowsheet section accepted, Sign clinical note

## 2017-11-09 ENCOUNTER — Encounter (HOSPITAL_COMMUNITY): Payer: Federal, State, Local not specified - PPO

## 2017-11-12 ENCOUNTER — Encounter (HOSPITAL_COMMUNITY): Payer: Federal, State, Local not specified - PPO

## 2017-11-14 ENCOUNTER — Encounter (HOSPITAL_COMMUNITY): Payer: Federal, State, Local not specified - PPO

## 2017-11-16 ENCOUNTER — Encounter (HOSPITAL_COMMUNITY): Payer: Federal, State, Local not specified - PPO

## 2017-11-19 ENCOUNTER — Encounter (HOSPITAL_COMMUNITY): Payer: Federal, State, Local not specified - PPO

## 2017-11-21 ENCOUNTER — Encounter (HOSPITAL_COMMUNITY): Payer: Federal, State, Local not specified - PPO

## 2017-11-23 ENCOUNTER — Encounter (HOSPITAL_COMMUNITY): Payer: Federal, State, Local not specified - PPO

## 2017-11-26 ENCOUNTER — Encounter (HOSPITAL_COMMUNITY): Payer: Federal, State, Local not specified - PPO

## 2017-11-28 ENCOUNTER — Encounter (HOSPITAL_COMMUNITY): Payer: Federal, State, Local not specified - PPO

## 2017-11-30 ENCOUNTER — Encounter (HOSPITAL_COMMUNITY): Payer: Federal, State, Local not specified - PPO

## 2017-12-03 ENCOUNTER — Encounter (HOSPITAL_COMMUNITY): Payer: Federal, State, Local not specified - PPO

## 2017-12-05 ENCOUNTER — Encounter (HOSPITAL_COMMUNITY): Payer: Federal, State, Local not specified - PPO

## 2017-12-07 ENCOUNTER — Encounter (HOSPITAL_COMMUNITY): Payer: Federal, State, Local not specified - PPO

## 2017-12-10 ENCOUNTER — Encounter (HOSPITAL_COMMUNITY): Payer: Federal, State, Local not specified - PPO

## 2017-12-12 ENCOUNTER — Encounter (HOSPITAL_COMMUNITY): Payer: Federal, State, Local not specified - PPO

## 2017-12-14 ENCOUNTER — Encounter (HOSPITAL_COMMUNITY): Payer: Federal, State, Local not specified - PPO

## 2017-12-17 ENCOUNTER — Telehealth: Payer: Self-pay | Admitting: Cardiovascular Disease

## 2017-12-17 ENCOUNTER — Encounter (HOSPITAL_COMMUNITY): Payer: Federal, State, Local not specified - PPO

## 2017-12-17 NOTE — Telephone Encounter (Signed)
Pt dropped off FMLA. Dr.McAlhany completed himself here in office.  called both home & cell no pick up or machine available. FMLA is ready for pick up.

## 2017-12-19 ENCOUNTER — Encounter (HOSPITAL_COMMUNITY): Payer: Federal, State, Local not specified - PPO

## 2017-12-21 ENCOUNTER — Encounter (HOSPITAL_COMMUNITY): Payer: Federal, State, Local not specified - PPO

## 2017-12-24 ENCOUNTER — Encounter (HOSPITAL_COMMUNITY): Payer: Federal, State, Local not specified - PPO

## 2017-12-26 ENCOUNTER — Encounter (HOSPITAL_COMMUNITY): Payer: Federal, State, Local not specified - PPO

## 2017-12-28 ENCOUNTER — Encounter (HOSPITAL_COMMUNITY): Payer: Federal, State, Local not specified - PPO

## 2018-02-13 ENCOUNTER — Ambulatory Visit: Payer: Federal, State, Local not specified - PPO | Admitting: Cardiovascular Disease

## 2018-02-13 ENCOUNTER — Encounter: Payer: Self-pay | Admitting: Cardiovascular Disease

## 2018-02-13 VITALS — BP 134/92 | HR 48 | Ht 68.5 in | Wt 198.0 lb

## 2018-02-13 DIAGNOSIS — I255 Ischemic cardiomyopathy: Secondary | ICD-10-CM | POA: Diagnosis not present

## 2018-02-13 DIAGNOSIS — I10 Essential (primary) hypertension: Secondary | ICD-10-CM

## 2018-02-13 DIAGNOSIS — E7800 Pure hypercholesterolemia, unspecified: Secondary | ICD-10-CM

## 2018-02-13 DIAGNOSIS — I25118 Atherosclerotic heart disease of native coronary artery with other forms of angina pectoris: Secondary | ICD-10-CM | POA: Diagnosis not present

## 2018-02-13 NOTE — Progress Notes (Signed)
Chief Complaint  Patient presents with  . Follow-up    CAD     History of Present Illness: 57 yo male with history of CAD, ischemic cardiomyopathy, HTN, HLD who is here today for cardiac follow up. He had a bare metal stent placed in the LAD in 2008. Cardiac cath in June 2014 and he had patent LAD stent, 50% mid Circumflex stenosis and severe disease in the RCA. A drug eluting stent was placed in the proximal RCA. Echo in 2009 with LVEF=35-40%, periapical AK, ant HK, mild LAE. He did not tolerate Ace-inhibitors due to cough. He is not on a beta blocker due to bradycardia. He did not tolerate ARB secondary to dizziness. I saw him 06/05/13 and he c/o SOB and sharp left sided chest pains as well as fatigue. I arranged a stress myoview on 06/24/13 which did not show ischemia, LVEF=34%. I saw him on 02/01/17 and he had c/o chest pain and dyspnea on exertion. Nuclear stress test 02/16/17 with scar in the anteroseptal and apical walls with ischemia noted. Cardiac cath 02/26/17 with chronic occlusion RCA, patent proximal LAD stent and severe stenosis distal Circumflex artery. A drug eluting stent was placed in the distal Circumflex artery. He was discharged on ASA and Plavix. Readmitted 03/06/17 with chest pain and ruled out for MI with serial cardiac enzymes. He has been taking Imdur.   He is here today for follow up. The patient denies any chest pain, palpitations, lower extremity edema, orthopnea, PND, dizziness, near syncope or syncope. He continues to endorse dyspnea with moderate activity which has kept him from being able to perform his duties at work. He is seeking disability now. He has worked for the [employer] for 34 years. .  Primary Care Physician: Clovis Riley, Elbert Ewings.[name], MD   Past Medical History:  Diagnosis Date  . Allergic rhinitis   . Anxiety   . Arthritis    "lower back, right thumb, knees" (10/04/2012)  . Asthma    "grew out of it" (10/04/2012)  . Coronary artery disease    a. s/p prior  stent to LAD;  b. LHC 6/14: LAD stent patent, mCFX 50%, pRCA 95-99%, mRCA 75%, EF 45% with inf HK => PCI: Promus DES to pRCA  . Depression   . Hyperlipidemia   . Hypertension   . MI (myocardial infarction) (HCC) 03/2007  . OSA (obstructive sleep apnea)    mild  . Pneumonia    "as a child" (10/04/2012)    Past Surgical History:  Procedure Laterality Date  . ARTHROPLASTY Right 1993   'crushed; removed bone fragments" (10/04/2012)  . CARDIAC CATHETERIZATION  2010  . CORONARY ANGIOPLASTY WITH STENT PLACEMENT  2008; 10/04/2012   "1 + 1" (10/04/2012)  . CORONARY STENT INTERVENTION N/A 02/26/2017   Procedure: CORONARY STENT INTERVENTION;  Surgeon: Kathleene Hazel, MD;  Location: [facility] INVASIVE CV LAB;  Service: Cardiovascular;  Laterality: N/A;  . EXPLORATORY LAPAROTOMY  1990's?   "went in to repair hernia; found fatty tissue instead; no hernia repair" (10/04/2012)  . INTRAVASCULAR PRESSURE WIRE/FFR STUDY N/A 02/26/2017   Procedure: INTRAVASCULAR PRESSURE WIRE/FFR STUDY;  Surgeon: Kathleene Hazel, MD;  Location: [facility] INVASIVE CV LAB;  Service: Cardiovascular;  Laterality: N/A;  . LEFT HEART CATH AND CORONARY ANGIOGRAPHY N/A 02/26/2017   Procedure: LEFT HEART CATH AND CORONARY ANGIOGRAPHY;  Surgeon: Kathleene Hazel, MD;  Location: [facility] INVASIVE CV LAB;  Service: Cardiovascular;  Laterality: N/A;  . LEFT HEART CATHETERIZATION WITH CORONARY ANGIOGRAM N/A  10/04/2012   Procedure: LEFT HEART CATHETERIZATION WITH CORONARY ANGIOGRAM;  Surgeon: Tonny Bollman, MD;  Location: [facility] CATH LAB;  Service: Cardiovascular;  Laterality: N/A;    Current Outpatient Medications  Medication Sig Dispense Refill  . aliskiren (TEKTURNA) 300 MG tablet Take 300 mg by mouth daily with breakfast.     . amLODipine (NORVASC) 10 MG tablet TAKE 1 TABLET (10 MG TOTAL) BY MOUTH DAILY. 90 tablet 2  . aspirin EC 81 MG tablet Take 81 mg by mouth daily with breakfast.    . atorvastatin (LIPITOR) 40 MG tablet Take 1 tablet  (40 mg total) daily by mouth. 30 tablet 2  . citalopram (CELEXA) 20 MG tablet Take 20 mg by mouth daily with breakfast.     . etodolac (LODINE) 400 MG tablet Take 400 mg by mouth 2 (two) times daily as needed (FOR PAIN.).   0  . finasteride (PROSCAR) 5 MG tablet Take 5 mg by mouth daily.    . hydrochlorothiazide (HYDRODIURIL) 25 MG tablet TAKE 1 TABLET (25 MG TOTAL) BY MOUTH DAILY. 30 tablet 10  . isosorbide mononitrate (IMDUR) 30 MG 24 hr tablet Take 1 tablet (30 mg total) by mouth daily. 90 tablet 3  . nitroGLYCERIN (NITROSTAT) 0.4 MG SL tablet PLACE 1 TABLET UNDER THE TONGUE EVERY 5 MINUTES AS NEEDED FOR CHEST PAIN. 25 tablet 5  . tamsulosin (FLOMAX) 0.4 MG CAPS capsule Take 0.4 mg by mouth daily with breakfast.      No current facility-administered medications for this visit.     Allergies  Allergen Reactions  . Adhesive [Tape] Other (See Comments)    SKIN SCARRING/IRRITATION. PAPER TAPE IS OKAY    Social History   Socioeconomic History  . Marital status: Married    Spouse name: Not on file  . Number of children: 1  . Years of education: Not on file  . Highest education level: Not on file  Occupational History    Employer: [employer]  Social Needs  . Financial resource strain: Not on file  . Food insecurity:    Worry: Not on file    Inability: Not on file  . Transportation needs:    Medical: Not on file    Non-medical: Not on file  Tobacco Use  . Smoking status: Former Smoker    Packs/day: 0.50    Years: 20.00    Pack years: 10.00    Types: Cigarettes    Last attempt to quit: 12/31/1998    Years since quitting: 19.1  . Smokeless tobacco: Never Used  Substance and Sexual Activity  . Alcohol use: Yes    Comment: 10/04/2012 "1 beer plus 2 mixed drinks once/month"  . Drug use: Yes    Frequency: 14.0 times per week    Types: Marijuana    Comment: smokes 2 joints/day  . Sexual activity: Yes  Lifestyle  . Physical activity:    Days per week: 0 days    Minutes per  session: 0 min  . Stress: Very much  Relationships  . Social connections:    Talks on phone: Not on file    Gets together: Not on file    Attends religious service: Not on file    Active member of club or organization: Not on file    Attends meetings of clubs or organizations: Not on file    Relationship status: Not on file  . Intimate partner violence:    Fear of current or ex partner: Not on file  Emotionally abused: Not on file    Physically abused: Not on file    Forced sexual activity: Not on file  Other Topics Concern  . Not on file  Social History Narrative  . Not on file    Family History  Problem Relation Age of Onset  . Atrial fibrillation Mother        irregular heart beats  . Hypertension Mother   . Diabetes type II Mother   . CAD Maternal Grandfather   . CAD Maternal Grandmother     Review of Systems:  As stated in the HPI and otherwise negative.   BP (!) 134/92   Pulse (!) 48   Ht 5' 8.5" (1.74 m)   Wt 198 lb (89.8 kg)   SpO2 97%   BMI 29.67 kg/m   Physical Examination:  General: Well developed, well nourished, NAD  HEENT: OP clear, mucus membranes moist  SKIN: warm, dry. No rashes. Neuro: No focal deficits  Musculoskeletal: Muscle strength 5/5 all ext  Psychiatric: Mood and affect normal  Neck: No JVD, no carotid bruits, no thyromegaly, no lymphadenopathy.  Lungs:Clear bilaterally, no wheezes, rhonci, crackles Cardiovascular: Regular rate and rhythm. No murmurs, gallops or rubs. Abdomen:Soft. Bowel sounds present. Non-tender.  Extremities: No lower extremity edema. Pulses are 2 + in the bilateral DP/PT.  Echo 02/16/17: - Left ventricle: The cavity size was normal. There was mild focal   basal hypertrophy of the septum. Systolic function was moderately   reduced. The estimated ejection fraction was in the range of 35%   to 40%. Global hypokinesis with apical dyskinesis. Features are   consistent with a pseudonormal left ventricular filling  pattern,   with concomitant abnormal relaxation and increased filling   pressure (grade 2 diastolic dysfunction). Doppler parameters are   consistent with indeterminate ventricular filling pressure. - Aortic valve: Transvalvular velocity was within the normal range.   There was no stenosis. There was trivial regurgitation. - Left atrium: The atrium was mildly dilated. - Right ventricle: The cavity size was normal. Wall thickness was   normal. Systolic function was normal. - Tricuspid valve: There was mild regurgitation. - Pulmonary arteries: Systolic pressure was within the normal   range. PA peak pressure: 30 mm Hg (S).  Cardiac cath 02/26/17:  Ost RCA to Prox RCA lesion, 40 %stenosed.  Prox RCA lesion, 60 %stenosed.  Mid RCA lesion, 100 %stenosed.  1st Mrg lesion, 30 %stenosed.  Mid Cx lesion, 75 %stenosed.  Ost LAD to Prox LAD lesion, 20 %stenosed.  1st Diag lesion, 60 %stenosed.   1. Triple vessel CAD 2. Patent stent proximal LAD with minimal in stent restenosis 3. Severe stenosis distal Circumflex artery 4. Successful PTCA/DES x 1 distal Circumflex 5. Patent proximal RCA stent followed by chronic total occlusion of RCA in the mid segment. The distal RCA fills from left to right collaterals.   Diagnostic Diagram      PCI of the distal Circumflex was peformed. Xience Sierra 2.75 x 12 mm stent placed.   EKG:  EKG is ordered today. The ekg ordered today demonstrates Sinus bradycardia, rate 56 bpm. PVC. Inferior Q waves. Poor R wave progression precordial leads with T wave inversions lateral leads. Unchanged.   Recent Labs: 03/07/2017: BUN 18; Creatinine, Ser 1.16; Hemoglobin 12.1; Platelets 183; Potassium 3.8; Sodium 140   Lipid Panel Followed in primary care   Wt Readings from Last 3 Encounters:  02/13/18 198 lb (89.8 kg)  10/25/17 204 lb 12.8 oz (92.9 kg)  10/31/17 201 lb 4.5 oz (91.3 kg)     Other studies Reviewed: Additional studies/ records that were  reviewed today include: . Review of the above records demonstrates:   Assessment and Plan:   1. CAD with chronic stable angina: He has occasional mild chest pains. He does not tolerate beta blockers, ARBs or Ace-inhibitors. Will continue ASA, statin and Imdur. Will stop Plavix. (He does not tolerate beta blockers due to bradycardia).   2. HTN: BP is controlled.   3. HLD: Lipids followed in primary care. Will continue statin.   4. Ischemic cardiomyopathy: LVEF=35-40% by echo 02/16/17. He does not tolerate medical therapy which would be indicated (beta blockers/Ace-inh/ARB). No changes today. His dyspnea may be due to his cardiomyopathy. Volume status is ok. I told him I would be glad to outline his cardiac issues on disability paperwork.   This did not include the time spent discussing his cardiac disease.   Current medicines are reviewed at length with the patient today.  The patient does not have concerns regarding medicines.  The following changes have been made:  no change  Labs/ tests ordered today include:   Orders Placed This Encounter  Procedures  . EKG 12-Lead    Disposition:   FU with me in 12 months.   Signed, Verne Carrow, MD 02/13/2018 10:59 AM    [facility] [address] [place], [place], [place]  [address] Phone: [phone]; Fax: [phone]

## 2018-02-13 NOTE — Patient Instructions (Signed)
Medication Instructions:  Your physician has recommended you make the following change in your medication: Stop Plavix (clopidogrel)  If you need a refill on your cardiac medications before your next appointment, please call your pharmacy.   Lab work: none If you have labs (blood work) drawn today and your tests are completely normal, you will receive your results only by: Marland Kitchen MyChart Message (if you have MyChart) OR . A paper copy in the mail If you have any lab test that is abnormal or we need to change your treatment, we will call you to review the results.  Testing/Procedures: none  Follow-Up: At [facility], you and your health needs are our priority.  As part of our continuing mission to provide you with exceptional heart care, we have created designated Provider Care Teams.  These Care Teams include your primary Cardiologist (physician) and Advanced Practice Providers (APPs -  Physician Assistants and Nurse Practitioners) who all work together to provide you with the care you need, when you need it. You will need a follow up appointment in 12 months.  Please call our office 2 months in advance to schedule this appointment.  You may see Verne Carrow, MD or one of the following Advanced Practice Providers on your designated Care Team:   [place], [name] Ronie Spies, [name] . Jacolyn Reedy, [name]  Any Other Special Instructions Will Be Listed Below (If Applicable).

## 2018-03-08 DIAGNOSIS — Z23 Encounter for immunization: Secondary | ICD-10-CM | POA: Diagnosis not present

## 2018-03-08 DIAGNOSIS — F430 Acute stress reaction: Secondary | ICD-10-CM | POA: Diagnosis not present

## 2018-05-08 DIAGNOSIS — F411 Generalized anxiety disorder: Secondary | ICD-10-CM | POA: Diagnosis not present

## 2018-05-08 DIAGNOSIS — F331 Major depressive disorder, recurrent, moderate: Secondary | ICD-10-CM | POA: Diagnosis not present

## 2018-05-13 DIAGNOSIS — M25512 Pain in left shoulder: Secondary | ICD-10-CM | POA: Diagnosis not present

## 2018-08-06 ENCOUNTER — Telehealth: Payer: Self-pay | Admitting: Cardiovascular Disease

## 2018-08-06 NOTE — Telephone Encounter (Signed)
Spoke with wife and advised at this time the office is not giving out notes for patient stating they are at higher risk d/t their medical history.  Wife would like for Dr Clifton James to decide if he would get pt a note or no. Advised I will forward this request to him and she will be c/b once we have an answer.

## 2018-08-06 NOTE — Telephone Encounter (Signed)
New Message           Patient's wife is calling to get a note for her husband for work  due to him having the underline conditions for the "Corvid-19 Virus. Pls call to advise.

## 2018-08-07 NOTE — Telephone Encounter (Signed)
Is this something that others have been doing? I have been off for a few days and wasn't sure if we had discussed this as a group?   Austin Ingram

## 2018-08-07 NOTE — Telephone Encounter (Signed)
According to management, we are to say no however it is ultimately up to each physician/provider if they want to supply a letter or not.

## 2018-08-09 ENCOUNTER — Encounter: Payer: Self-pay | Admitting: Cardiovascular Disease

## 2018-08-09 NOTE — Telephone Encounter (Signed)
I have written a letter for Austin Ingram. Can we let him know and see if he wants it emailed to him? Thanks, chris

## 2018-08-09 NOTE — Telephone Encounter (Signed)
I spoke to the patient and informed him that Dr Clifton James wrote a letter for him concerning the CoronaVirus.  The patient will come by the office to get the letter.

## 2018-09-04 DIAGNOSIS — I10 Essential (primary) hypertension: Secondary | ICD-10-CM | POA: Diagnosis not present

## 2018-09-04 DIAGNOSIS — E669 Obesity, unspecified: Secondary | ICD-10-CM | POA: Diagnosis not present

## 2018-09-04 DIAGNOSIS — R918 Other nonspecific abnormal finding of lung field: Secondary | ICD-10-CM | POA: Diagnosis not present

## 2018-09-04 DIAGNOSIS — F329 Major depressive disorder, single episode, unspecified: Secondary | ICD-10-CM | POA: Diagnosis not present

## 2018-10-07 DIAGNOSIS — M2560 Stiffness of unspecified joint, not elsewhere classified: Secondary | ICD-10-CM | POA: Diagnosis not present

## 2018-10-07 DIAGNOSIS — M545 Low back pain: Secondary | ICD-10-CM | POA: Diagnosis not present

## 2018-10-07 DIAGNOSIS — M9903 Segmental and somatic dysfunction of lumbar region: Secondary | ICD-10-CM | POA: Diagnosis not present

## 2018-10-07 DIAGNOSIS — R293 Abnormal posture: Secondary | ICD-10-CM | POA: Diagnosis not present

## 2018-10-09 DIAGNOSIS — M2560 Stiffness of unspecified joint, not elsewhere classified: Secondary | ICD-10-CM | POA: Diagnosis not present

## 2018-10-09 DIAGNOSIS — M545 Low back pain: Secondary | ICD-10-CM | POA: Diagnosis not present

## 2018-10-09 DIAGNOSIS — M9903 Segmental and somatic dysfunction of lumbar region: Secondary | ICD-10-CM | POA: Diagnosis not present

## 2018-10-09 DIAGNOSIS — R293 Abnormal posture: Secondary | ICD-10-CM | POA: Diagnosis not present

## 2018-10-15 DIAGNOSIS — M2560 Stiffness of unspecified joint, not elsewhere classified: Secondary | ICD-10-CM | POA: Diagnosis not present

## 2018-10-15 DIAGNOSIS — M9903 Segmental and somatic dysfunction of lumbar region: Secondary | ICD-10-CM | POA: Diagnosis not present

## 2018-10-15 DIAGNOSIS — R293 Abnormal posture: Secondary | ICD-10-CM | POA: Diagnosis not present

## 2018-10-15 DIAGNOSIS — M545 Low back pain: Secondary | ICD-10-CM | POA: Diagnosis not present

## 2018-10-18 DIAGNOSIS — M545 Low back pain: Secondary | ICD-10-CM | POA: Diagnosis not present

## 2018-10-18 DIAGNOSIS — M9903 Segmental and somatic dysfunction of lumbar region: Secondary | ICD-10-CM | POA: Diagnosis not present

## 2018-10-18 DIAGNOSIS — R293 Abnormal posture: Secondary | ICD-10-CM | POA: Diagnosis not present

## 2018-10-18 DIAGNOSIS — M2560 Stiffness of unspecified joint, not elsewhere classified: Secondary | ICD-10-CM | POA: Diagnosis not present

## 2018-11-12 ENCOUNTER — Other Ambulatory Visit: Payer: Self-pay | Admitting: Cardiovascular Disease

## 2018-11-28 DIAGNOSIS — Z1211 Encounter for screening for malignant neoplasm of colon: Secondary | ICD-10-CM | POA: Diagnosis not present

## 2018-12-16 DIAGNOSIS — H2513 Age-related nuclear cataract, bilateral: Secondary | ICD-10-CM | POA: Diagnosis not present

## 2018-12-16 DIAGNOSIS — H524 Presbyopia: Secondary | ICD-10-CM | POA: Diagnosis not present

## 2018-12-31 DIAGNOSIS — H2513 Age-related nuclear cataract, bilateral: Secondary | ICD-10-CM | POA: Diagnosis not present

## 2018-12-31 DIAGNOSIS — Z0000 Encounter for general adult medical examination without abnormal findings: Secondary | ICD-10-CM | POA: Diagnosis not present

## 2018-12-31 DIAGNOSIS — H524 Presbyopia: Secondary | ICD-10-CM | POA: Diagnosis not present

## 2019-01-17 DIAGNOSIS — E7800 Pure hypercholesterolemia, unspecified: Secondary | ICD-10-CM | POA: Diagnosis not present

## 2019-01-17 DIAGNOSIS — Z125 Encounter for screening for malignant neoplasm of prostate: Secondary | ICD-10-CM | POA: Diagnosis not present

## 2019-02-26 ENCOUNTER — Encounter: Payer: Self-pay | Admitting: Physician Assistant

## 2019-02-26 NOTE — Progress Notes (Signed)
Cardiology Office Note    Date:  02/28/2019   ID:  Laiken Manders, DOB November 29, 1960, MRN [id number]  PCP:  Clovis Riley, L.[name], MD  Cardiologist:  Verne Carrow, MD  Electrophysiologist:  None   Chief Complaint: 1 year f/u CAD, CHF  History of Present Illness:   Austin Ingram is a 58 y.o. male with history of CAD (BMS to LAD 2008, DES to pRCA 2014, DES to dCx 01/2017 with residual disease noted below) with chronic stable angina, ischemic cardiomyopathy, HTN, HLD, sinus bradycardia (not on BB due to this) who is here today for cardiac follow up. He has required updated periodic cardiac testing over the years. He did not tolerate beta blocker due to bradycardia. He had a cough with ACEI. Remotely he had dizziness with an ARB so has been on Tekturna for a long time. I see Diovan 160mg  daily listed in a remote discharge summary from 2008. Records do not resume in the computer until 2010 when he was off Diovan and on Tekturna. In 2015 he had a stress myoview for atypical symptoms on 06/24/13 which did not show ischemia, LVEF 34%. In 01/2017 he complained of chest pain and DOE. 2D echo 01/2017 showed EF 35-40%, grade 2 DD, mild LAE, mild TR. Nuclear stress test 01/2017 was abnormal prompting cath which showed chronic occlusion RCA, patent proximal LAD stent and severe stenosis distal Circumflex artery. A drug eluting stent was placed in the distal circumflex. He was discharged on ASA and Plavix. He was doing well at last OV 01/2018 so Plavix was discontinued, noted to have stable chronic DOE. Last labs 12/2018 showed LDL 72, Cr 1.11, K 3.6, Na 142, albumin 3.9, LFTs wnl, 03/2017 Hgb 12.1.   He is seen back for routine follow-up today overall doing OK. He has noticed over the past couple of months increasing DOE with activity such as yardwork or pushing himself beyond walking, NYHA class II but possibly veering into III at times. He has had rare fleeting pinching chest pain about 3-4x time over the last year  for which he's taken SL NTG. No CP with exertion. He denies any palpitations, near syncope, orthopnea, weight gain or edema. No family history of CHF. He has not required standing diuretic aside from HCTZ. Blood pressure mildly elevated today, c/w prior trend. He reports remote sleep study but was not able to comply with CPAP at that time for diagnosis of OSA - he would like to revisit.    Past Medical History:  Diagnosis Date   Allergic rhinitis    Anxiety    Arthritis    "lower back, right thumb, knees" (10/04/2012)   Asthma    "grew out of it" (10/04/2012)   Coronary artery disease    a. s/p prior stent to LAD;  b. LHC 6/14: DES to pRCA. c. DES to dCx 01/2017 with residual disease.   Depression    Hyperlipidemia    Hypertension    Ischemic cardiomyopathy    MI (myocardial infarction) (HCC) 03/2007   OSA (obstructive sleep apnea)    mild   Pneumonia    "as a child" (10/04/2012)   Sinus bradycardia     Past Surgical History:  Procedure Laterality Date   ARTHROPLASTY Right 1993   'crushed; removed bone fragments" (10/04/2012)   CARDIAC CATHETERIZATION  2010   CORONARY ANGIOPLASTY WITH STENT PLACEMENT  2008; 10/04/2012   "1 + 1" (10/04/2012)   CORONARY STENT INTERVENTION N/A 02/26/2017   Procedure: CORONARY STENT INTERVENTION;  Surgeon:  Kathleene Hazel, MD;  Location: [facility];  Service: Cardiovascular;  Laterality: N/A;   EXPLORATORY LAPAROTOMY  1990's?   "went in to repair hernia; found fatty tissue instead; no hernia repair" (10/04/2012)   INTRAVASCULAR PRESSURE WIRE/FFR STUDY N/A 02/26/2017   Procedure: INTRAVASCULAR PRESSURE WIRE/FFR STUDY;  Surgeon: Kathleene Hazel, MD;  Location: [facility];  Service: Cardiovascular;  Laterality: N/A;   LEFT HEART CATH AND CORONARY ANGIOGRAPHY N/A 02/26/2017   Procedure: LEFT HEART CATH AND CORONARY ANGIOGRAPHY;  Surgeon: Kathleene Hazel, MD;  Location: [facility];  Service:  Cardiovascular;  Laterality: N/A;   LEFT HEART CATHETERIZATION WITH CORONARY ANGIOGRAM N/A 10/04/2012   Procedure: LEFT HEART CATHETERIZATION WITH CORONARY ANGIOGRAM;  Surgeon: Tonny Bollman, MD;  Location: [facility] CATH [facility];  Service: Cardiovascular;  Laterality: N/A;    Current Medications: Current Meds  Medication Sig   acetaminophen-codeine (TYLENOL #3) 300-30 MG tablet Take 1 tablet by mouth as needed.   aliskiren (TEKTURNA) 300 MG tablet Take 300 mg by mouth daily with breakfast.    amLODipine (NORVASC) 10 MG tablet TAKE 1 TABLET (10 MG TOTAL) BY MOUTH DAILY.   amoxicillin (AMOXIL) 500 MG capsule TAKE 4 CAPSULES ONE HOUR PRIOR TO EACH DENTAL APPOINTMENT   aspirin EC 81 MG tablet Take 81 mg by mouth daily with breakfast.   atorvastatin (LIPITOR) 40 MG tablet Take 1 tablet (40 mg total) daily by mouth.   Azelastine HCl 137 MCG/SPRAY SOLN Place 1 spray into both nostrils 2 (two) times daily.   citalopram (CELEXA) 20 MG tablet Take 20 mg by mouth daily with breakfast.    etodolac (LODINE) 400 MG tablet Take 400 mg by mouth 2 (two) times daily as needed (FOR PAIN.).    finasteride (PROSCAR) 5 MG tablet Take 5 mg by mouth daily.   hydrochlorothiazide (HYDRODIURIL) 25 MG tablet TAKE 1 TABLET (25 MG TOTAL) BY MOUTH DAILY.   isosorbide mononitrate (IMDUR) 30 MG 24 hr tablet TAKE 1 TABLET BY MOUTH EVERY DAY   meloxicam (MOBIC) 15 MG tablet Take 15 mg by mouth daily as needed.   nitroGLYCERIN (NITROSTAT) 0.4 MG SL tablet PLACE 1 TABLET UNDER THE TONGUE EVERY 5 MINUTES AS NEEDED FOR CHEST PAIN.   tamsulosin (FLOMAX) 0.4 MG CAPS capsule Take 0.4 mg by mouth daily with breakfast.    [DISCONTINUED] clopidogrel (PLAVIX) 75 MG tablet Take 75 mg by mouth daily.    Allergies:   Adhesive [tape]   Social History   Socioeconomic History   Marital status: Married    Spouse name: Not on file   Number of children: 1   Years of education: Not on file   Highest education level: Not on  file  Occupational History    Employer: [employer]  Social Needs   Financial resource strain: Not on file   Food insecurity    Worry: Not on file    Inability: Not on file   Transportation needs    Medical: Not on file    Non-medical: Not on file  Tobacco Use   Smoking status: Former Smoker    Packs/day: 0.50    Years: 20.00    Pack years: 10.00    Types: Cigarettes    Quit date: 12/31/1998    Years since quitting: 20.1   Smokeless tobacco: Never Used  Substance and Sexual Activity   Alcohol use: Yes    Comment: 10/04/2012 "1 beer plus 2 mixed drinks once/month"   Drug use:  Yes    Frequency: 14.0 times per week    Types: Marijuana    Comment: smokes 2 joints/day   Sexual activity: Yes  Lifestyle   Physical activity    Days per week: 0 days    Minutes per session: 0 min   Stress: Very much  Relationships   Social connections    Talks on phone: Not on file    Gets together: Not on file    Attends religious service: Not on file    Active member of club or organization: Not on file    Attends meetings of clubs or organizations: Not on file    Relationship status: Not on file  Other Topics Concern   Not on file  Social History Narrative   Not on file     Family History:  The patient's family history includes Atrial fibrillation in his mother; CAD in his maternal grandfather and maternal grandmother; Diabetes type II in his mother; Hypertension in his mother.  ROS:   Please see the history of present illness.  All other systems are reviewed and otherwise negative.    EKGs/Labs/Other Studies Reviewed:    Studies reviewed were summarized above.   EKG:  EKG is ordered today, personally reviewed, demonstrating SB 55bpm, one PVC, LAD, prior inferior infarct and anteroseptal infarct with TWI I, avL, biphasic STTW V4, TWI V5-V6  Recent Labs: No results found for requested labs within last 8760 hours.  Recent Lipid Panel    Component Value Date/Time    CHOL 135 03/06/2017 0525   TRIG 86 03/06/2017 0525   HDL 41 03/06/2017 0525   CHOLHDL 3.3 03/06/2017 0525   VLDL 17 03/06/2017 0525   LDLCALC 77 03/06/2017 0525   LDLDIRECT 59.9 06/03/2007 1114    PHYSICAL EXAM:    VS:  BP 136/84    Pulse 65    Ht 5\' 9"  (1.753 m)    Wt 203 lb 9.6 oz (92.4 kg)    SpO2 98%    BMI 30.07 kg/m   BMI: Body mass index is 30.07 kg/m.  GEN: Well nourished, well developed AAM, in no acute distress HEENT: normocephalic, atraumatic Neck: no JVD, carotid bruits, or masses Cardiac: RRR; no murmurs, rubs, or gallops, no edema  Respiratory:  clear to auscultation bilaterally, normal work of breathing GI: soft, nontender, nondistended, + BS MS: no deformity or atrophy Skin: warm and dry, no rash Neuro:  Alert and Oriented x 3, Strength and sensation are intact, follows commands Psych: euthymic mood, full affect  Wt Readings from Last 3 Encounters:  02/28/19 203 lb 9.6 oz (92.4 kg)  02/13/18 198 lb (89.8 kg)  10/25/17 204 lb 12.8 oz (92.9 kg)     ASSESSMENT & PLAN:   1. Exertional dyspnea - he has had chronic DOE for quite some time but has seen some progression over the last year. His EF has remained persistently low. Will repeat to further assess and obtain updated labs. He actually appears fairly euvolemic on exam. It does appear there is opportunity to advance his HF regimen. Per our discussion today it does sound like he would be willing to revisit ARB therapy (which I would hope would be possible especially since tolerating Tekturna for so long). He is also not on spironolactone. I discussed his case with Dr. Haroldine Laws who would suggest considering stopping HCTZ, amlodipine & Tekturna and going directly to [facility] 49/51mg  with an eye towards titration again in 2 weeks if BP and BMET were OK. The  goal would also be to eventually add spironolactone as well. If exertional dyspnea persists or LVEF looks worse, will need to consider updating ischemic  testing. 2. CAD - he remains on ASA. No BB at this time given baseline bradycardia. See below regarding statin. Continue Imdur. Infrequent episodes of atypical CP, but does not typically occur with exertion. Will f/u echocardiogram. 3. Ischemic cardiomyopathy - as above. Reviewed 2g sodium restriction, 2L fluid restriction, daily weights with patient. 4. Essential HTN - blood pressure mildly elevated. Plan to manage in context of the above.  5. Hyperlipidemia - lipids are managed by primary care, last checked in 12/2018. LDL was marginally above goal but do not want to overwhelm him with more than one change at a time. Consideration could be given in the future to titrating atorvastatin to 80mg  daily given premature CAD history and recurrent PCIs. 6. PVCs - one PVC noted on EKG today. Will check lytes and TSH today. 7. OSA - noted on sleep study 2009. Not treated. Will update sleep study since it impacts cardiovascular health and underlying HF diagnosis. Update echo to exclude development of pulmonary HTN as a sequalae of sleep apnea.  Disposition: F/u with myself virtually in 2 weeks. Will likely order BMET around time of that visit as well. Will also tentatively also arrange f/u with Dr. Clifton JamesMcAlhany for 3 months given anticipation of ongoing HF med titration.  Medication Adjustments/Labs and Tests Ordered: Current medicines are reviewed at length with the patient today.  Concerns regarding medicines are outlined above. Medication changes, Labs and Tests ordered today are summarized above and listed in the Patient Instructions accessible in Encounters.   Signed, Laurann Montanaayna N Kip Cropp, [name]  02/28/2019 12:27 PM    [facility] [address], [place], [place]  [address] Phone: [phone]; Fax: [phone]

## 2019-02-28 ENCOUNTER — Encounter: Payer: Self-pay | Admitting: Physician Assistant

## 2019-02-28 ENCOUNTER — Ambulatory Visit (INDEPENDENT_AMBULATORY_CARE_PROVIDER_SITE_OTHER): Payer: Federal, State, Local not specified - PPO | Admitting: Physician Assistant

## 2019-02-28 ENCOUNTER — Other Ambulatory Visit: Payer: Self-pay

## 2019-02-28 VITALS — BP 136/84 | HR 65 | Ht 69.0 in | Wt 203.6 lb

## 2019-02-28 DIAGNOSIS — R0600 Dyspnea, unspecified: Secondary | ICD-10-CM

## 2019-02-28 DIAGNOSIS — G4733 Obstructive sleep apnea (adult) (pediatric): Secondary | ICD-10-CM

## 2019-02-28 DIAGNOSIS — E785 Hyperlipidemia, unspecified: Secondary | ICD-10-CM

## 2019-02-28 DIAGNOSIS — R0609 Other forms of dyspnea: Secondary | ICD-10-CM

## 2019-02-28 DIAGNOSIS — I2510 Atherosclerotic heart disease of native coronary artery without angina pectoris: Secondary | ICD-10-CM | POA: Diagnosis not present

## 2019-02-28 DIAGNOSIS — I493 Ventricular premature depolarization: Secondary | ICD-10-CM

## 2019-02-28 DIAGNOSIS — I10 Essential (primary) hypertension: Secondary | ICD-10-CM | POA: Diagnosis not present

## 2019-02-28 DIAGNOSIS — I255 Ischemic cardiomyopathy: Secondary | ICD-10-CM

## 2019-02-28 LAB — BASIC METABOLIC PANEL
BUN/Creatinine Ratio: 20 (ref 9–20)
BUN: 22 mg/dL (ref 6–24)
CO2: 24 mmol/L (ref 20–29)
Calcium: 9.2 mg/dL (ref 8.7–10.2)
Chloride: 104 mmol/L (ref 96–106)
Creatinine, Ser: 1.1 mg/dL (ref 0.76–1.27)
GFR calc Af Amer: 86 mL/min/{1.73_m2} (ref >59)
GFR calc non Af Amer: 75 mL/min/{1.73_m2} (ref >59)
Glucose: 95 mg/dL (ref 65–99)
Potassium: 3.9 mmol/L (ref 3.5–5.2)
Sodium: 143 mmol/L (ref 134–144)

## 2019-02-28 LAB — TSH: TSH: 0.711 u[IU]/mL (ref 0.450–4.500)

## 2019-02-28 LAB — PRO B NATRIURETIC PEPTIDE: NT-Pro BNP: 249 pg/mL — ABNORMAL HIGH (ref 0–210)

## 2019-02-28 LAB — MAGNESIUM: Magnesium: 2.2 mg/dL (ref 1.6–2.3)

## 2019-02-28 NOTE — Patient Instructions (Addendum)
Medication Instructions:  Your physician recommends that you continue on your current medications as directed. Please refer to the Current Medication list given to you today.  *If you need a refill on your cardiac medications before your next appointment, please call your pharmacy*  Lab Work: TODAY:  BMET, MAG, CBC, TSH, & PRO BNP  If you have labs (blood work) drawn today and your tests are completely normal, you will receive your results only by: Marland Kitchen MyChart Message (if you have MyChart) OR . A paper copy in the mail If you have any lab test that is abnormal or we need to change your treatment, we will call you to review the results.  Testing/Procedures: Your physician has requested that you have an echocardiogram. Echocardiography is a painless test that uses sound waves to create images of your heart. It provides your doctor with information about the size and shape of your heart and how well your heart's chambers and valves are working. This procedure takes approximately one hour. There are no restrictions for this procedure.   Your physician has recommended that you have a sleep study. This test records several body functions during sleep, including: brain activity, eye movement, oxygen and carbon dioxide blood levels, heart rate and rhythm, breathing rate and rhythm, the flow of air through your mouth and nose, snoring, body muscle movements, and chest and belly movement.   Follow-Up: At [facility], you and your health needs are our priority.  As part of our continuing mission to provide you with exceptional heart care, we have created designated Provider Care Teams.  These Care Teams include your primary Cardiologist (physician) and Advanced Practice Providers (APPs -  Physician Assistants and Nurse Practitioners) who all work together to provide you with the care you need, when you need it.  Your next appointment:   03/17/2019  The format for your next appointment:   Virtual  Visit   Provider:   Melina Copa, [name]   Your physician recommends that you schedule a follow-up appointment in: [place] DR. Angelena Form  Other Instructions For patients with history of weakened heart muscles, we give them these special instructions:  1. Follow a low-salt diet - you are allowed no more than 2,000mg  of sodium per day. Watch your fluid intake. In general, you should not be taking in more than 2 liters of fluid per day (this is about 68 oz per day max). This includes sources of water in foods like soup, coffee, tea, milk, etc. 2. Weigh yourself on the same scale at same time of day and keep a log. 3. Call your doctor: (Anytime you feel any of the following symptoms)  - 3lb weight gain overnight or 5lb within a few days - Shortness of breath, with or without a dry hacking cough  - Swelling in the hands, feet or stomach  - If you have to sleep on extra pillows at night in order to breathe  IT IS IMPORTANT TO LET YOUR DOCTOR KNOW EARLY ON IF YOU ARE HAVING SYMPTOMS SO WE CAN HELP YOU!    Please get a blood pressure cuff that goes on your arm. The wrist ones can be inaccurate. If possible, try to select one that also reports your heart rate. To check your blood pressure, choose a time about 3 hours after taking your blood pressure medicines. Remain seated in a chair for 5 minutes quietly beforehand, then check it. When you get a cuff, please record a few of those readings  2-3 days and call us/send in MyChart message with them for our review.        Virtual Visit Pre-Appointment Phone Call  "(Name), I am calling you today to discuss your upcoming appointment. We are currently trying to limit exposure to the virus that causes COVID-19 by seeing patients at home rather than in the office."  1. "What is the BEST phone number to call the day of the visit?" - include this in appointment notes  2. "Do you have or have access to (through a family member/friend) a smartphone with  video capability that we can use for your visit?" a. If yes - list this number in appt notes as "cell" (if different from BEST phone #) and list the appointment type as a VIDEO visit in appointment notes b. If no - list the appointment type as a PHONE visit in appointment notes  3. Confirm consent - "In the setting of the current Covid19 crisis, you are scheduled for a (phone or video) visit with your provider on (date) at (time).  Just as we do with many in-office visits, in order for you to participate in this visit, we must obtain consent.  If you'd like, I can send this to your mychart (if signed up) or email for you to review.  Otherwise, I can obtain your verbal consent now.  All virtual visits are billed to your insurance company just like a normal visit would be.  By agreeing to a virtual visit, we'd like you to understand that the technology does not allow for your provider to perform an examination, and thus may limit your provider's ability to fully assess your condition. If your provider identifies any concerns that need to be evaluated in person, we will make arrangements to do so.  Finally, though the technology is pretty good, we cannot assure that it will always work on either your or our end, and in the setting of a video visit, we may have to convert it to a phone-only visit.  In either situation, we cannot ensure that we have a secure connection.  Are you willing to proceed?" STAFF: Did the patient verbally acknowledge consent to telehealth visit? Document YES/NO here: YES, pt gave verbal consent at appt 02/28/2019  4. Advise patient to be prepared - "Two hours prior to your appointment, go ahead and check your blood pressure, pulse, oxygen saturation, and your weight (if you have the equipment to check those) and write them all down. When your visit starts, your provider will ask you for this information. If you have an Apple Watch or Kardia device, please plan to have heart rate  information ready on the day of your appointment. Please have a pen and paper handy nearby the day of the visit as well."  5. Give patient instructions for MyChart download to smartphone OR Doximity/Doxy.me as below if video visit (depending on what platform provider is using)  6. Inform patient they will receive a phone call 15 minutes prior to their appointment time (may be from unknown caller ID) so they should be prepared to answer    TELEPHONE CALL NOTE  Austin Ingram has been deemed a candidate for a follow-up tele-health visit to limit community exposure during the Covid-19 pandemic. I spoke with the patient via phone to ensure availability of phone/video source, confirm preferred email & phone number, and discuss instructions and expectations.  I reminded Andrius Proby to be prepared with any vital sign and/or heart rhythm information that could  potentially be obtained via home monitoring, at the time of his visit. I reminded Mal Goldbach to expect a phone call prior to his visit.  Elliot Cousin, RMA 02/28/2019 12:11 PM     IF USING DOXIMITY or DOXY.ME - The patient will receive a link just prior to their visit by text.     FULL LENGTH CONSENT FOR TELE-HEALTH VISIT   I hereby voluntarily request, consent and authorize [facility] and its employed or contracted physicians, physician assistants, nurse practitioners or other licensed health care professionals (the Practitioner), to provide me with telemedicine health care services (the "Services") as deemed necessary by the treating Practitioner. I acknowledge and consent to receive the Services by the Practitioner via telemedicine. I understand that the telemedicine visit will involve communicating with the Practitioner through live audiovisual communication technology and the disclosure of certain medical information by electronic transmission. I acknowledge that I have been given the opportunity to request an in-person assessment or  other available alternative prior to the telemedicine visit and am voluntarily participating in the telemedicine visit.  I understand that I have the right to withhold or withdraw my consent to the use of telemedicine in the course of my care at any time, without affecting my right to future care or treatment, and that the Practitioner or I may terminate the telemedicine visit at any time. I understand that I have the right to inspect all information obtained and/or recorded in the course of the telemedicine visit and may receive copies of available information for a reasonable fee.  I understand that some of the potential risks of receiving the Services via telemedicine include:  Marland Kitchen Delay or interruption in medical evaluation due to technological equipment failure or disruption; . Information transmitted may not be sufficient (e.g. poor resolution of images) to allow for appropriate medical decision making by the Practitioner; and/or  . In rare instances, security protocols could fail, causing a breach of personal health information.  Furthermore, I acknowledge that it is my responsibility to provide information about my medical history, conditions and care that is complete and accurate to the best of my ability. I acknowledge that Practitioner's advice, recommendations, and/or decision may be based on factors not within their control, such as incomplete or inaccurate data provided by me or distortions of diagnostic images or specimens that may result from electronic transmissions. I understand that the practice of medicine is not an exact science and that Practitioner makes no warranties or guarantees regarding treatment outcomes. I acknowledge that I will receive a copy of this consent concurrently upon execution via email to the email address I last provided but may also request a printed copy by calling the office of [facility].    I understand that my insurance will be billed for this visit.   I  have read or had this consent read to me. . I understand the contents of this consent, which adequately explains the benefits and risks of the Services being provided via telemedicine.  . I have been provided ample opportunity to ask questions regarding this consent and the Services and have had my questions answered to my satisfaction. . I give my informed consent for the services to be provided through the use of telemedicine in my medical care  By participating in this telemedicine visit I agree to the above.

## 2019-03-03 ENCOUNTER — Telehealth: Payer: Self-pay | Admitting: *Deleted

## 2019-03-03 NOTE — Telephone Encounter (Signed)
Call placed to pt re: lab results, left a message for him to call back.  

## 2019-03-03 NOTE — Telephone Encounter (Signed)
-----   Message from Charlie Pitter, [place] sent at 02/28/2019  5:10 PM EDT ----- Please let pt know labs all look good. BNP (fluid marker) is only marginally up so I suspect his gradually worsening SOB is coming from his low ejection fraction. I actually discussed his case with Dr. Haroldine Laws (one of our advanced heart failure doctors) given that his EF has remained very low for quite some time. Dr. Haroldine Laws agrees that it is very worthwhile trying a trial of Entresto, and then perhaps looking down the road at starting spironolactone. I discussed this tentative plan with the patient in clinic today.  Per discussion with Dr. Haroldine Laws, he would recommend to stop amlodipine, HCTZ, and Tekturna and start Entresto 49/51mg  BID.   Here's the tricky part - he should not take his first dose of Entresto until at least 36 hours after his last dose of Tekturna. (This is per discussion with our pharmacy team - it is not known whether Marisa Severin acts like an ACEI or an ARB so we are doing the 36 hour washout to be cautious so the meds don't overlap.)  Of note, would not stop any of the meds abruptly until he knows if he can afford Entresto. If it is cost prohibitive, don't change any meds yet til he talks to [place].   Let's repeat BMET 1 day before virtual visit. He should monitor his BP in the meantime with these changes and send [place] in readings 3-4 days after making the med changes.  Dayna Dunn [name]

## 2019-03-04 NOTE — Telephone Encounter (Signed)
2nd attempt to reach pt re: lab results, left another message for pt to call back. 

## 2019-03-06 ENCOUNTER — Other Ambulatory Visit: Payer: Self-pay

## 2019-03-06 ENCOUNTER — Ambulatory Visit (HOSPITAL_COMMUNITY): Payer: Federal, State, Local not specified - PPO | Attending: Cardiology

## 2019-03-06 DIAGNOSIS — R0600 Dyspnea, unspecified: Secondary | ICD-10-CM | POA: Insufficient documentation

## 2019-03-06 DIAGNOSIS — I2510 Atherosclerotic heart disease of native coronary artery without angina pectoris: Secondary | ICD-10-CM

## 2019-03-06 DIAGNOSIS — E785 Hyperlipidemia, unspecified: Secondary | ICD-10-CM | POA: Diagnosis not present

## 2019-03-06 DIAGNOSIS — G4733 Obstructive sleep apnea (adult) (pediatric): Secondary | ICD-10-CM | POA: Insufficient documentation

## 2019-03-06 DIAGNOSIS — I493 Ventricular premature depolarization: Secondary | ICD-10-CM | POA: Diagnosis not present

## 2019-03-06 DIAGNOSIS — R0609 Other forms of dyspnea: Secondary | ICD-10-CM

## 2019-03-06 DIAGNOSIS — I255 Ischemic cardiomyopathy: Secondary | ICD-10-CM | POA: Diagnosis not present

## 2019-03-06 DIAGNOSIS — I10 Essential (primary) hypertension: Secondary | ICD-10-CM | POA: Diagnosis not present

## 2019-03-07 ENCOUNTER — Telehealth: Payer: Self-pay

## 2019-03-07 DIAGNOSIS — R0602 Shortness of breath: Secondary | ICD-10-CM

## 2019-03-07 MED ORDER — ENTRESTO 49-51 MG PO TABS: 1.0000 | ORAL_TABLET | Freq: Two times a day (BID) | ORAL | 3 refills | Status: DC

## 2019-03-07 NOTE — Telephone Encounter (Signed)
-----   Message from Dayna N Dunn, [name] sent at 03/07/2019  1:53 PM EST ----- Thank you for calling pt, Austin Ingram! I saw order for echo report come through and Entresto, but I still see all the other meds we were planning on stopping on his medicine list (From last lab result note). Please confirm that you discussed these changes with the patient regarding stopping amlodipine, HCTZ, and Tekturna and dosing the Entresto based on last dose of Tekturna. Thanks -Dayna 

## 2019-03-07 NOTE — Telephone Encounter (Signed)
Notes recorded by Frederik Schmidt, RN on 03/07/2019 at 11:32 AM EST  The patient has been notified of the Echo and lab results and verbalized understanding. All questions (if any) were answered.  Frederik Schmidt, RN 03/07/2019 11:32 AM   I spoke to the patient about starting Entresto 49/51.  He will check with his pharmacy on cost and will update [place].

## 2019-03-07 NOTE — Telephone Encounter (Signed)
-----   Message from Charlie Pitter, [place] sent at 03/07/2019 10:57 AM EST ----- Sending to triage since [place] and I are both off. Echo continues to show weak heart function. There is not much change from before. The study was unable to visualize the apex bottom of the heart but it does not appear to move well. Needs limited echo with definity asap to exclude LV thrombus (this would be a chronic finding without any acute symptoms rather than something he himself would be feeling). Please arrange. We also had a result note on labs but doesn't look like [place] was able to reach him. Thanks. Dayna

## 2019-03-07 NOTE — Telephone Encounter (Signed)
I spoke to the patient who is awaiting the cost of Entresto from his pharmacy.  He will reach out to [place].

## 2019-03-10 ENCOUNTER — Telehealth: Payer: Self-pay

## 2019-03-10 DIAGNOSIS — Z79899 Other long term (current) drug therapy: Secondary | ICD-10-CM

## 2019-03-10 NOTE — Telephone Encounter (Signed)
Pt has been made aware of the cost of Entresto with PA. Pt also aware that he can use the free 30 day card for his 1 st fill and we will have someone send him a $10 copay card to use monthly after that.' Pt confirms his last dose of Tekturna was last night around 7:30-8:00 and he is aware not to take anymore and his 1st dose of Entresto should be 8:00 a.m. 03/11/2019. Pt was reminded to stop Amlodipine and HCTZ as well. Pt will come in or BMET Friday 03/14/2019 and has been reminded of his Virtual f/u appt with Melina Copa, [name] 03/17/2019.

## 2019-03-10 NOTE — Telephone Encounter (Signed)
**Note De-Identified Madilyne Tadlock Obfuscation** Following message received from covermymeds: [place] (Key: [id number])   Delene Loll 49-51MG  tablets  Form FEP Electronic PA Form (NCPDP)    Determination  Favorable   Message from Plan Your PA request has been approved. Additional information will be provided in the approval communication.  I called [facility] and s/w Lanelle Bal who advised me that the cost for the pts Delene Loll was $1924 for a 90 day supply and now even after the PA approval the cost is $911/90 day supply and that the pt more than likely has a high deductible.  Since the pt has commercial ins he can use a $10 co-pay card. He may need to activate the card prior to picking up his RX from his pharmacy. If there are no cards in the office we can reach out to Tammy Sours, LCSW for assistance.

## 2019-03-10 NOTE — Telephone Encounter (Signed)
-----   Message from Charlie Pitter, [place] sent at 03/07/2019  1:53 PM EST ----- Thank you for calling pt, Austin Ingram! I saw order for echo report come through and Entresto, but I still see all the other meds we were planning on stopping on his medicine list (From last lab result note). Please confirm that you discussed these changes with the patient regarding stopping amlodipine, HCTZ, and Tekturna and dosing the Entresto based on last dose of Tekturna. Thanks -[employer]

## 2019-03-10 NOTE — Telephone Encounter (Signed)
I have started an [place] PA through covermymeds. Key: [id number]

## 2019-03-10 NOTE — Telephone Encounter (Signed)
Notes recorded by Frederik Schmidt, RN on 03/10/2019 at 8:12 AM EST  lpmtcb 11/9.Marland Kitchen He is going to update [place] on [place] cost.  ------

## 2019-03-10 NOTE — Telephone Encounter (Signed)
Call placed to pt to reschedule appts for Lab / Virtual f/u for 11/13 and 11/16. No answer.

## 2019-03-10 NOTE — Telephone Encounter (Signed)
See phone note from 03/07/2019.  Frederik Schmidt, RN, spoke with pt re: lab / echo results and recommendations.

## 2019-03-12 ENCOUNTER — Other Ambulatory Visit: Payer: Self-pay

## 2019-03-12 ENCOUNTER — Ambulatory Visit (HOSPITAL_COMMUNITY): Payer: Federal, State, Local not specified - PPO | Attending: Cardiovascular Disease

## 2019-03-12 DIAGNOSIS — R0602 Shortness of breath: Secondary | ICD-10-CM

## 2019-03-12 MED ORDER — PERFLUTREN LIPID MICROSPHERE
1.0000 mL | INTRAVENOUS | Status: AC | PRN
  Administered 2019-03-12: 2 mL via INTRAVENOUS

## 2019-03-12 NOTE — Telephone Encounter (Signed)
Pt has been made aware that his lab / virtual appt has been moved due to the start date of Entresto.

## 2019-03-13 ENCOUNTER — Telehealth: Payer: Self-pay | Admitting: Physician Assistant

## 2019-03-13 ENCOUNTER — Other Ambulatory Visit: Payer: Federal, State, Local not specified - PPO | Admitting: *Deleted

## 2019-03-13 DIAGNOSIS — Z79899 Other long term (current) drug therapy: Secondary | ICD-10-CM

## 2019-03-13 DIAGNOSIS — I255 Ischemic cardiomyopathy: Secondary | ICD-10-CM

## 2019-03-13 LAB — CBC
Hematocrit: 43.6 % (ref 37.5–51.0)
Hemoglobin: 13.7 g/dL (ref 13.0–17.7)
MCH: 23.1 pg — ABNORMAL LOW (ref 26.6–33.0)
MCHC: 31.4 g/dL — ABNORMAL LOW (ref 31.5–35.7)
MCV: 74 fL — ABNORMAL LOW (ref 79–97)
Platelets: 197 10*3/uL (ref 150–450)
RBC: 5.92 x10E6/uL — ABNORMAL HIGH (ref 4.14–5.80)
RDW: 15.2 % (ref 11.6–15.4)
WBC: 5.9 10*3/uL (ref 3.4–10.8)

## 2019-03-13 MED ORDER — APIXABAN 5 MG PO TABS: 5.0000 mg | ORAL_TABLET | Freq: Two times a day (BID) | ORAL | 3 refills | Status: DC

## 2019-03-13 NOTE — Telephone Encounter (Signed)
Spoke with Austin Ingram the pts wife on DPR and she verbalized understanding of Austin Ingram's message..pt will come in today for his labs/ CBC.... [facility] and she will call back if they develop any further questions or concerns.    Echo order placed for 6 months.

## 2019-03-13 NOTE — Telephone Encounter (Signed)
See echo report. I recently met [name] for his routine follow-up and he was reporting continued shortness of breath with exertion. Given his longstanding persistently low EF, I discussed his case with Dr. Haroldine Laws with Advanced HF team and per that discussion we decided to discontinue amlodipine, HCTZ and Tekturna in lieu of starting Entresto.  I arranged a repeat echo that showed heart muscle remained weak. That study did not visualize the apex (bottom) of the heart well. Therefore he had a f/u limited echo that was read yesterday evening showing an apical thrombus.   I tried to call the patient's numbers but at his home number he was not home, and on his cell it went right to VM so I left a message for him to call the office back and ask to speak to triage. Triage, can you also please try him again in a short while as well?  When he calls back, please let him know these results. Let him know that down towards the bottom of his heart, the pump does not move the blood around very well. As a result, the pooled blood has formed a clot. It has likely been there for some time but we don't really know the duration. This was not mentioned on his 2018 study. If left untreated, there is a 10-15% chance this could travel somewhere else in the form of a stroke or mini stroke, or clot to one of his other organs like down to his leg, kidney or spleen.  Given this clot laying in the bottom of his heart, he needs to be on a blood thinner (something in addition to his aspirin). I discussed his case with Dr. Angelena Form and our pharmacy team. Traditionally we used to chose Lovenox injections with a bridge to Coumadin with frequent lab monitoring, but we are also using some of our non-Coumadin blood thinners like Xarelto or Eliquis to treat these clots. It should be noted that although both medicines are known to treat blood clots, there has not been a specific trial yet dedicated to getting FDA approval of  Xarelto/Eliquis. However, from data compiled in our hospital system and reviewed by our pharmacy team, it is felt that these medications are likely to be at least as safe and effective as Coumadin would be. Dr. Angelena Form recommends using one of these "DOAC" medicines instead of Coumadin. Eliquis wins out between the two as being safer and more effective than Xarelto when it comes to this diagnosis of LV thrombus. Per discussion with Dr. Angelena Form and our pharmacy team we would therefore recommend starting Eliquis 76m BID.  I am not physically in the office today but [facility] Dr. MAngelena Formtoday to see if we have an Eliquis starter cards or samples to get him started. He would need to start this medicine ASAP. Unfortunately I do not see he had had a baseline hemoglobin level lately with his primary care labs so we will need him to have a baseline CBC as well ASAP. Please let him know that when we put someone on a blood thinner we obviously do not take this decision likely - we look at risk of stroke vs risk of bleeding. It would not be normal to see blood in places you do not normally see blood just because someone is on a blood thinner, so he would want to monitor for such. If he noticed any bleeding such as blood in stool, black tarry stools, blood  in urine, nosebleeds or any other unusual bleeding, he would need to call us immediately. It is not normal to have this kind of bleeding while on a blood thinner and usually indicates there is an underlying problem with one of your body systems that needs to be checked out.   Ultimately the plan would be to be on a blood thinner for at least 6 months then reassess his LV function/clot to see if it is any better. We can talk more at his virtual visit. Austin Bukowski [name]

## 2019-03-13 NOTE — Telephone Encounter (Signed)
Patient returning [place] Dunn's call in regards to his results. Please advise, thank you!

## 2019-03-14 ENCOUNTER — Other Ambulatory Visit: Payer: Federal, State, Local not specified - PPO

## 2019-03-17 ENCOUNTER — Telehealth: Payer: Federal, State, Local not specified - PPO | Admitting: Physician Assistant

## 2019-03-25 ENCOUNTER — Other Ambulatory Visit: Payer: Federal, State, Local not specified - PPO

## 2019-03-25 ENCOUNTER — Other Ambulatory Visit: Payer: Self-pay

## 2019-03-25 DIAGNOSIS — Z79899 Other long term (current) drug therapy: Secondary | ICD-10-CM

## 2019-03-26 LAB — BASIC METABOLIC PANEL
BUN/Creatinine Ratio: 15 (ref 9–20)
BUN: 20 mg/dL (ref 6–24)
CO2: 29 mmol/L (ref 20–29)
Calcium: 8.8 mg/dL (ref 8.7–10.2)
Chloride: 106 mmol/L (ref 96–106)
Creatinine, Ser: 1.32 mg/dL — ABNORMAL HIGH (ref 0.76–1.27)
GFR calc Af Amer: 69 mL/min/{1.73_m2} (ref >59)
GFR calc non Af Amer: 60 mL/min/{1.73_m2} (ref >59)
Glucose: 78 mg/dL (ref 65–99)
Potassium: 4.1 mmol/L (ref 3.5–5.2)
Sodium: 146 mmol/L — ABNORMAL HIGH (ref 134–144)

## 2019-03-28 ENCOUNTER — Encounter: Payer: Self-pay | Admitting: Physician Assistant

## 2019-03-28 NOTE — Progress Notes (Signed)
Virtual Visit via Video Note   This visit type was conducted due to national recommendations for restrictions regarding the COVID-19 Pandemic (e.g. social distancing) in an effort to limit this patient's exposure and mitigate transmission in our community.  Due to his co-morbid illnesses, this patient is at least at moderate risk for complications without adequate follow up.  This format is felt to be most appropriate for this patient at this time.  All issues noted in this document were discussed and addressed.  A limited physical exam was performed with this format.  The patient verbally consented to telemedicine visit with [facility] at time of his last in-person visit. Virtual platform was offered given ongoing worsening Covid-19 pandemic.  Date:  03/31/2019   ID:  Austin Ingram, DOB 03/29/1961, MRN [id number]  Patient Location: Home Provider Location: [facility] Office  PCP:  Clovis Riley, L.[name], MD  Cardiologist:  Verne Carrow, MD  Electrophysiologist:  None   Evaluation Performed:  Follow-Up Visit  Chief Complaint:  F/u CHF  History of Present Illness:    Austin Ingram is a 58 y.o. male with CAD (BMS to LAD 2008, DES to pRCA 2014, DES to dCx 01/2017 with residual disease noted below) with chronic stable angina, ischemic cardiomyopathy, chronic combined CHF, recently diagnosed LV thrombus, HTN, HLD, sinus bradycardia (not on BB due to this), CKD stage II by labs (baseline Cr 1.1-1.4), OSA who is here today for cardiac follow up.   Per chart, he has had persistently low EF. He did not tolerate beta blocker due to bradycardia. He had a cough with ACEI. Remotely he had dizziness with an ARB so had been on Tekturna for a long time. In 2015 he had a stress Myoview for atypical symptoms on 06/24/13 which did not show ischemia, LVEF 34%. In 01/2017 he complained of chest pain and DOE. 2D echo 01/2017 showed EF 35-40%, grade 2 DD, mild LAE, mild TR. Nuclear stress test 01/2017 was  abnormal prompting cath which showed chronic occlusion RCA, patent proximal LAD stent and severe stenosis distal Circumflex artery. A drug eluting stent was placed in the distal circumflex. He was discharged on ASA and Plavix. His Plavix was discontinued after 1 year of therapy in 01/2018. More recently he returned to the office for routine follow-up 02/28/19 reporting increased DOE, NYHA class II-III. He denied any anginal chest pain, palpitations, near syncope, orthopnea, weight gain or edema. His blood pressure was mildly elevated. I discussed his case and medication regimen with our Advanced Heart Failure colleagues given his prior difficulties with traditional HF medications. Per that discussion, we discontinued amlodipine, HCTZ, and Tekturna and transitioned him to Hansen 49/51mg  BID. F/u echo 03/06/19 showed EF 35-40% with apical dyskinesis/akinesis, grade 2 DD, mildly enlarged RV, moderately dilated LAE, could not rule out apical thrombus. F/u limited echo 03/12/19 did demonstrate presence of LV thrombus. Per discussions with pharmacy and Dr. Clifton James, the patient was started on Eliquis (off-label use). Last labs showed normal TSH, Mg, Hgb 13.7 (chronically microcytic), with f/u BMET 03/25/19 showing K 4.1, Cr 1.32, last labs 12/2018 showed LDL 72, albumin 3.9, LFTs wnl.  He returns for virtual follow-up today overall feeling better. He said he definitely noticed a difference in how he was feeling from before and after starting Entresto. He still has chronic dyspnea which impairs his ability to perform higher levels of activity but his stamina and energy level feel better. He is not having any chest pain. Blood pressure is running elevated. No edema  or orthopnea reported. He does report some rib soreness to palpation on his right side that he plans to see primary care about. This does not sound related to anything cardiac.             The patient does not have symptoms concerning for COVID-19 infection  (fever, chills, cough, or new shortness of breath).    Past Medical History:  Diagnosis Date  . Allergic rhinitis   . Anxiety   . Arthritis    "lower back, right thumb, knees" (10/04/2012)  . Asthma    "grew out of it" (10/04/2012)  . CKD (chronic kidney disease), stage II   . Coronary artery disease    a. s/p prior stent to LAD;  b. LHC 6/14: DES to pRCA. c. DES to dCx 01/2017 with residual disease.  . Depression   . Hyperlipidemia   . Hypertension   . Ischemic cardiomyopathy   . LV (left ventricular) mural thrombus   . MI (myocardial infarction) ([place]) 03/2007  . OSA (obstructive sleep apnea)    mild  . Pneumonia    "as a child" (10/04/2012)  . Sinus bradycardia    Past Surgical History:  Procedure Laterality Date  . ARTHROPLASTY Right 1993   'crushed; removed bone fragments" (10/04/2012)  . CARDIAC CATHETERIZATION  2010  . CORONARY ANGIOPLASTY WITH STENT PLACEMENT  2008; 10/04/2012   "1 + 1" (10/04/2012)  . CORONARY STENT INTERVENTION N/A 02/26/2017   Procedure: CORONARY STENT INTERVENTION;  Surgeon: Burnell Blanks, MD;  Location: [place] [facility];  Service: Cardiovascular;  Laterality: N/A;  . EXPLORATORY LAPAROTOMY  1990's?   "went in to repair hernia; found fatty tissue instead; no hernia repair" (10/04/2012)  . INTRAVASCULAR PRESSURE WIRE/FFR STUDY N/A 02/26/2017   Procedure: INTRAVASCULAR PRESSURE WIRE/FFR STUDY;  Surgeon: Burnell Blanks, MD;  Location: [place] [facility];  Service: Cardiovascular;  Laterality: N/A;  . LEFT HEART CATH AND CORONARY ANGIOGRAPHY N/A 02/26/2017   Procedure: LEFT HEART CATH AND CORONARY ANGIOGRAPHY;  Surgeon: Burnell Blanks, MD;  Location: [place] [facility];  Service: Cardiovascular;  Laterality: N/A;  . LEFT HEART CATHETERIZATION WITH CORONARY ANGIOGRAM N/A 10/04/2012   Procedure: LEFT HEART CATHETERIZATION WITH CORONARY ANGIOGRAM;  Surgeon: Sherren Mocha, MD;  Location: [facility];  Service: Cardiovascular;  Laterality:  N/A;     Current Meds  Medication Sig  . acetaminophen-codeine (TYLENOL #3) 300-30 MG tablet Take 1 tablet by mouth as needed for moderate pain.   Marland Kitchen amoxicillin (AMOXIL) 500 MG capsule TAKE 4 CAPSULES ONE HOUR PRIOR TO [facility] DENTAL APPOINTMENT  . apixaban (ELIQUIS) 5 MG TABS tablet Take 1 tablet (5 mg total) by mouth 2 (two) times daily.  Marland Kitchen aspirin EC 81 MG tablet Take 81 mg by mouth daily with breakfast.  . atorvastatin (LIPITOR) 40 MG tablet Take 1 tablet (40 mg total) daily by mouth.  . Azelastine HCl 137 MCG/SPRAY SOLN Place 1 spray into both nostrils 2 (two) times daily.  . citalopram (CELEXA) 20 MG tablet Take 20 mg by mouth daily with breakfast.   . finasteride (PROSCAR) 5 MG tablet Take 5 mg by mouth daily.  . isosorbide mononitrate (IMDUR) 30 MG 24 hr tablet TAKE 1 TABLET BY MOUTH EVERY DAY  . meloxicam (MOBIC) 15 MG tablet Take 15 mg by mouth daily as needed for pain.   . nitroGLYCERIN (NITROSTAT) 0.4 MG SL tablet PLACE 1 TABLET UNDER THE TONGUE EVERY 5 MINUTES AS NEEDED FOR CHEST PAIN.  Marland Kitchen  sacubitril-valsartan (ENTRESTO) 49-51 MG Take 1 tablet by mouth 2 (two) times daily.  . tamsulosin (FLOMAX) 0.4 MG CAPS capsule Take 0.4 mg by mouth daily with breakfast.   . [DISCONTINUED] etodolac (LODINE) 400 MG tablet Take 400 mg by mouth 2 (two) times daily as needed (FOR PAIN.).      Allergies:   Adhesive [tape]   Social History   Tobacco Use  . Smoking status: Former Smoker    Packs/day: 0.50    Years: 20.00    Pack years: 10.00    Types: Cigarettes    Quit date: 12/31/1998    Years since quitting: 20.2  . Smokeless tobacco: Never Used  Substance Use Topics  . Alcohol use: Yes    Comment: 10/04/2012 "1 beer plus 2 mixed drinks once/month"  . Drug use: Yes    Frequency: 14.0 times per week    Types: Marijuana    Comment: smokes 2 joints/day     Family Hx: The patient's family history includes Atrial fibrillation in his mother; CAD in his maternal grandfather and maternal  grandmother; Diabetes type II in his mother; Hypertension in his mother.  ROS:   Please see the history of present illness.    All other systems reviewed and are negative.   Prior CV studies:    Most recent pertinent cardiac studies are outlined above.  Labs/Other Tests and Data Reviewed:    EKG:  An ECG dated 02/28/19 was personally reviewed today and demonstrated:  SB 55bpm, one PVC, LAD, prior inferior infarct and anteroseptal infarct with TWI I, avL, biphasic STTW V4, TWI V5-V6  Recent Labs: 02/28/2019: Magnesium 2.2; NT-Pro BNP 249; TSH 0.711 03/13/2019: Hemoglobin 13.7; Platelets 197 03/25/2019: BUN 20; Creatinine, Ser 1.32; Potassium 4.1; Sodium 146   Recent Lipid Panel Lab Results  Component Value Date/Time   CHOL 135 03/06/2017 05:25 AM   TRIG 86 03/06/2017 05:25 AM   HDL 41 03/06/2017 05:25 AM   CHOLHDL 3.3 03/06/2017 05:25 AM   LDLCALC 77 03/06/2017 05:25 AM   LDLDIRECT 59.9 06/03/2007 11:14 AM    Wt Readings from Last 3 Encounters:  03/31/19 203 lb 9.6 oz (92.4 kg)  02/28/19 203 lb 9.6 oz (92.4 kg)  02/13/18 198 lb (89.8 kg)     Objective:    Vital Signs:  BP (!) 156/92   Pulse (!) 54   Ht 5\' 9"  (1.753 m)   Wt 203 lb 9.6 oz (92.4 kg)   BMI 30.07 kg/m    VS reviewed. General - well appearing AAM in no acute distress HEENT - NCAT, EOM intact Pulm - No labored breathing, no coughing during visit, no audible wheezing, speaking in full sentences Neuro - A+Ox3, no slurred speech, answers questions appropriately Psych - Pleasant affect     ASSESSMENT & PLAN:    1. Chronic combined CHF - we have recently undertaken the strategy of attempting to optimize his HF regimen. He actually feels a lot better on the Entresto, which I am thankful for since he previously did not tolerate ARB about 10 years ago. He says his stamina and fatigue are improved. He still has chronic DOE with higher levels of activities for which he is considering pursuing disability. Dr.  previously indicated he would be willing to outline cardiac issues on disability paperwork. He has struggled with chronic CHF for quite some time. His blood pressure is suboptimally controlled at this time but I am not surprised given recent changes. His BMET was reviewed today. Cr  was 1.32 but it does appear his previous baseline is 1.1-1.4. We will attempt to increase Entresto further to 97/103mg  BID with a recheck BMET in about 10 days with follow-up virtual visit after that. Consideration can then be given to addition of spironolactone versus addition of hydralazine. He is not on BB due to baseline bradycardia. 2. LV thrombus - now on apixaban, discussed off-label use with patient. Tentatively he has an echocardiogram in the system to repeat in 6 months to reassess. Discussed bleeding signs with patient. Will get f/u CBC when he returns for his BMET. 3. Essential HTN - follow with med changes as above. 4. Hyperlipidemia - lipids are managed by primary care, last checked in 12/2018. LDL was marginally above goal but do not want to overwhelm him with more than one change at a time. He is also having some right rib pain that needs investigation by primary care and I do not want to confuse the picture by increasing this. Consideration could be given in the future to titrating atorvastatin to 80mg  daily given premature CAD history and recurrent PCIs. 5. CAD - continue ASA (bleeding signs reviewed) and statin. Also discussed limiting Mobic given risk of bleeding/NSAIDS. No recent anginal chest pain. 6. OSA - pending repeat sleep study - awaiting scheduling. Control of OSA would also help his BP and CHF outlook.  COVID-19 Education: Discussed at last visit in-person.  Time:   Today, I have spent 20 minutes with the patient with telehealth technology discussing the above problems.     Medication Adjustments/Labs and Tests Ordered: Current medicines are reviewed at length with the patient today.   Concerns regarding medicines are outlined above.   Follow Up: with me virtually mid December after CBC/BMET  Signed, Laurann MontanaDayna N Dunn, [name]  03/31/2019 11:25 AM    [facility]

## 2019-03-31 ENCOUNTER — Telehealth (INDEPENDENT_AMBULATORY_CARE_PROVIDER_SITE_OTHER): Payer: Federal, State, Local not specified - PPO | Admitting: Physician Assistant

## 2019-03-31 ENCOUNTER — Other Ambulatory Visit: Payer: Self-pay

## 2019-03-31 ENCOUNTER — Encounter: Payer: Self-pay | Admitting: Physician Assistant

## 2019-03-31 VITALS — BP 156/92 | HR 54 | Ht 69.0 in | Wt 203.6 lb

## 2019-03-31 DIAGNOSIS — I513 Intracardiac thrombosis, not elsewhere classified: Secondary | ICD-10-CM

## 2019-03-31 DIAGNOSIS — I2510 Atherosclerotic heart disease of native coronary artery without angina pectoris: Secondary | ICD-10-CM

## 2019-03-31 DIAGNOSIS — G4733 Obstructive sleep apnea (adult) (pediatric): Secondary | ICD-10-CM

## 2019-03-31 DIAGNOSIS — E785 Hyperlipidemia, unspecified: Secondary | ICD-10-CM

## 2019-03-31 DIAGNOSIS — I5042 Chronic combined systolic (congestive) and diastolic (congestive) heart failure: Secondary | ICD-10-CM

## 2019-03-31 DIAGNOSIS — I10 Essential (primary) hypertension: Secondary | ICD-10-CM

## 2019-03-31 MED ORDER — ENTRESTO 97-103 MG PO TABS: 1.0000 | ORAL_TABLET | Freq: Two times a day (BID) | ORAL | 3 refills | Status: DC

## 2019-03-31 NOTE — Patient Instructions (Addendum)
Medication Instructions:  Your physician has recommended you make the following change in your medication:  1.  INCREASE the Entresto to 97/103 taking 1 tablet twice a day  *If you need a refill on your cardiac medications before your next appointment, please call your pharmacy*  Lab Work: 04/10/2019:  COME TO THE OFFICE FOR BMET & CBC  If you have labs (blood work) drawn today and your tests are completely normal, you will receive your results only by: Marland Kitchen MyChart Message (if you have MyChart) OR . A paper copy in the mail If you have any lab test that is abnormal or we need to change your treatment, we will call you to review the results.  Testing/Procedures: None ordered  Follow-Up: At [facility], you and your health needs are our priority.  As part of our continuing mission to provide you with exceptional heart care, we have created designated Provider Care Teams.  These Care Teams include your primary Cardiologist (physician) and Advanced Practice Providers (APPs -  Physician Assistants and Nurse Practitioners) who all work together to provide you with the care you need, when you need it.  Your next appointment:   2 week(s)  04/11/2019 11:00 is your next appointment VIRTUALLY   The format for your next appointment:   Virtual Visit   Provider:   Melina Copa, [name]  Other Instructions Keep a log of your blood pressure for review at next visit

## 2019-04-01 NOTE — Progress Notes (Signed)
Awesome. Thanks Dayna.

## 2019-04-03 ENCOUNTER — Telehealth: Payer: Self-pay | Admitting: Physician Assistant

## 2019-04-03 NOTE — Telephone Encounter (Signed)
New Message  Pt's wife is calling and stating that her husband would like a work note to light duty   Please call to discuss

## 2019-04-03 NOTE — Telephone Encounter (Signed)
Returned call to pt re: work note request.  Need to get more information from pt. Left a message for him to call back.

## 2019-04-10 ENCOUNTER — Other Ambulatory Visit: Payer: Federal, State, Local not specified - PPO | Admitting: *Deleted

## 2019-04-10 ENCOUNTER — Other Ambulatory Visit: Payer: Self-pay

## 2019-04-10 DIAGNOSIS — I513 Intracardiac thrombosis, not elsewhere classified: Secondary | ICD-10-CM

## 2019-04-10 DIAGNOSIS — I10 Essential (primary) hypertension: Secondary | ICD-10-CM | POA: Diagnosis not present

## 2019-04-10 DIAGNOSIS — I5042 Chronic combined systolic (congestive) and diastolic (congestive) heart failure: Secondary | ICD-10-CM

## 2019-04-10 NOTE — Progress Notes (Signed)
Virtual Visit via Telephone Note   This visit type was conducted due to national recommendations for restrictions regarding the COVID-19 Pandemic (e.g. social distancing) in an effort to limit this patient's exposure and mitigate transmission in our community.  Due to his co-morbid illnesses, this patient is at least at moderate risk for complications without adequate follow up.  This format is felt to be most appropriate for this patient at this time.  The patient did not have access to video technology/had technical difficulties with video requiring transitioning to audio format only (telephone).  All issues noted in this document were discussed and addressed.  No physical exam could be performed with this format.  Please refer to the patient's chart for his  consent to telehealth for [facility]. Virtual platform was offered given ongoing worsening Covid-19 pandemic.  Date:  04/11/2019   ID:  Austin Ingram, DOB 1961-02-23, MRN [id number]  Patient Location: Home Provider Location: Office  PCP:  Clovis Riley, Elbert Ewings.[name], MD  Cardiologist:  Verne Carrow, MD  Electrophysiologist:  None   Evaluation Performed:  Follow-Up Visit  Chief Complaint:  F/u CHF med titration  History of Present Illness:    Austin Ingram is a 58 y.o. male with CAD (BMS to LAD 2008, DES to pRCA 2014, DES to dCx 01/2017 with residual disease noted below) with chronic stable angina, ischemic cardiomyopathy, chronic combined CHF, recently diagnosed LV thrombus, HTN, HLD, sinus bradycardia (not on BB due to this), CKD stage II by labs (baseline Cr 1.1-1.4), OSA who is seen virtually today to f/u CHF.  Per chart, he has had persistently low EF. In 2015 he had a stress Myoview for atypical symptoms on 06/24/13 which did not show ischemia; LVEF 34%. In 01/2017 he complained of chest pain and DOE. 2D echo 01/2017 showed EF 35-40%, grade 2 DD, mild LAE, mild TR. Nuclear stress test 01/2017 was abnormal prompting cath which  showed chronic occlusion RCA, patent proximal LAD stent and severe stenosis distal Circumflex artery. A drug eluting stent was placed in the distal circumflex. He was discharged on ASA and Plavix. His Plavix was discontinued after 1 year of therapy in 01/2018. He has not tolerated beta blocker therapy due to bradycardia. He had a cough with ACEi in the past. Remotely he had dizziness with an ARB so had been on Tekturna for a long time until recently.   More recently he returned to the office for routine follow-up 02/28/19 reporting increased DOE, NYHA class II now encroaching on class III. He denied any anginal chest pain, palpitations, near syncope, orthopnea, weight gain or edema. His blood pressure was mildly elevated. I discussed his case and medication regimen with our Advanced Heart Failure colleagues given his prior difficulties with traditional HF medications. Per that discussion, we discontinued amlodipine, HCTZ, and Tekturna and transitioned him to [place]. Repeat echo 03/06/19 showed EF 35-40% with apical dyskinesis/akinesis, grade 2 DD, mildly enlarged RV, moderately dilated LAE, could not rule out apical thrombus. F/u limited echo 03/12/19 showed EF 40-45% and did demonstrate presence of LV thrombus. Per discussions with pharmacy and Dr. Clifton James, the patient was started on Eliquis (off-label use). At virtual f/u 03/31/19 he was feeling better. Blood pressure was elevated so we uptitrated his Entresto to 97/103mg  BID. Last labs 04/10/19 showed Hgb 12.8, K 3.8, Cr 1.28 (stable), 03/2019 normal TSH, Mg, otherwise 12/2018 LDL 72, albumin 3.9, LFTs wnl. Repeat sleep study is also pending.  He is seen back virtually for follow-up and feeling well. He  is tolerating the medication changes without adverse effect. Compared to October, he feels that he has more energy and stamina. He does still get winded with higher levels of exertion (chronic, not new) and requests a work note allowing him to take breaks  when this occurs. He just woke up and so blood pressure reflects that. He has not been following this regularly at home because of BP cuff issues. He did note that his wife's fit him better today so he will plan to use this over the weekend. Denies any bleeding.  The patient does not have symptoms concerning for COVID-19 infection (fever, chills, cough, or new shortness of breath).    Past Medical History:  Diagnosis Date  . Allergic rhinitis   . Anxiety   . Arthritis    "lower back, right thumb, knees" (10/04/2012)  . Asthma    "grew out of it" (10/04/2012)  . CKD (chronic kidney disease), stage II   . Coronary artery disease    a. s/p prior stent to LAD;  b. LHC 6/14: DES to pRCA. c. DES to dCx 01/2017 with residual disease.  . Depression   . Hyperlipidemia   . Hypertension   . Ischemic cardiomyopathy   . LV (left ventricular) mural thrombus   . MI (myocardial infarction) ([place]) 03/2007  . OSA (obstructive sleep apnea)    mild  . Pneumonia    "as a child" (10/04/2012)  . Sinus bradycardia    Past Surgical History:  Procedure Laterality Date  . ARTHROPLASTY Right 1993   'crushed; removed bone fragments" (10/04/2012)  . CARDIAC CATHETERIZATION  2010  . CORONARY ANGIOPLASTY WITH STENT PLACEMENT  2008; 10/04/2012   "1 + 1" (10/04/2012)  . CORONARY STENT INTERVENTION N/A 02/26/2017   Procedure: CORONARY STENT INTERVENTION;  Surgeon: Burnell Blanks, MD;  Location: [place] [facility];  Service: Cardiovascular;  Laterality: N/A;  . EXPLORATORY LAPAROTOMY  1990's?   "went in to repair hernia; found fatty tissue instead; no hernia repair" (10/04/2012)  . INTRAVASCULAR PRESSURE WIRE/FFR STUDY N/A 02/26/2017   Procedure: INTRAVASCULAR PRESSURE WIRE/FFR STUDY;  Surgeon: Burnell Blanks, MD;  Location: [place] [facility];  Service: Cardiovascular;  Laterality: N/A;  . LEFT HEART CATH AND CORONARY ANGIOGRAPHY N/A 02/26/2017   Procedure: LEFT HEART CATH AND CORONARY ANGIOGRAPHY;   Surgeon: Burnell Blanks, MD;  Location: [place] [facility];  Service: Cardiovascular;  Laterality: N/A;  . LEFT HEART CATHETERIZATION WITH CORONARY ANGIOGRAM N/A 10/04/2012   Procedure: LEFT HEART CATHETERIZATION WITH CORONARY ANGIOGRAM;  Surgeon: Sherren Mocha, MD;  Location: [facility] CATH LAB;  Service: Cardiovascular;  Laterality: N/A;     Current Meds  Medication Sig  . acetaminophen-codeine (TYLENOL #3) 300-30 MG tablet Take 1 tablet by mouth as needed for moderate pain.   Marland Kitchen amoxicillin (AMOXIL) 500 MG capsule TAKE 4 CAPSULES ONE HOUR PRIOR TO [facility] DENTAL APPOINTMENT  . apixaban (ELIQUIS) 5 MG TABS tablet Take 1 tablet (5 mg total) by mouth 2 (two) times daily.  Marland Kitchen aspirin EC 81 MG tablet Take 81 mg by mouth daily with breakfast.  . atorvastatin (LIPITOR) 40 MG tablet Take 1 tablet (40 mg total) daily by mouth.  . Azelastine HCl 137 MCG/SPRAY SOLN Place 1 spray into both nostrils 2 (two) times daily. As needed  . citalopram (CELEXA) 20 MG tablet Take 20 mg by mouth daily with breakfast.   . finasteride (PROSCAR) 5 MG tablet Take 5 mg by mouth daily.  . isosorbide mononitrate (IMDUR) 30  MG 24 hr tablet TAKE 1 TABLET BY MOUTH EVERY DAY  . meloxicam (MOBIC) 15 MG tablet Take 15 mg by mouth daily as needed for pain.   . nitroGLYCERIN (NITROSTAT) 0.4 MG SL tablet PLACE 1 TABLET UNDER THE TONGUE EVERY 5 MINUTES AS NEEDED FOR CHEST PAIN.  . sacubitril-valsartan (ENTRESTO) 97-103 MG Take 1 tablet by mouth 2 (two) times daily.  . tamsulosin (FLOMAX) 0.4 MG CAPS capsule Take 0.4 mg by mouth daily with breakfast.      Allergies:   Adhesive [tape]   Social History   Tobacco Use  . Smoking status: Former Smoker    Packs/day: 0.50    Years: 20.00    Pack years: 10.00    Types: Cigarettes    Quit date: 12/31/1998    Years since quitting: 20.2  . Smokeless tobacco: Never Used  Substance Use Topics  . Alcohol use: Yes    Comment: 10/04/2012 "1 beer plus 2 mixed drinks once/month"  . Drug use:  Yes    Frequency: 14.0 times per week    Types: Marijuana    Comment: smokes 2 joints/day     Family Hx: The patient's family history includes Atrial fibrillation in his mother; CAD in his maternal grandfather and maternal grandmother; Diabetes type II in his mother; Hypertension in his mother.  ROS:   Please see the history of present illness.    All other systems reviewed and are negative.   Prior CV studies:    Most recent pertinent cardiac studies are outlined above. Most recent echo 03/12/19:  IMPRESSIONS  1. Left ventricular ejection fraction, by visual estimation, is 40 to 45%. The left ventricle has mildly decreased function. There is no left ventricular hypertrophy.  2.    The apex is akinetic . Definity contrast reveals an apical thrombus.  3. Global right ventricle was not well visualized.The right ventricular size is not well visualized. Right vetricular wall thickness was not assessed.  4. Left atrial size was not well visualized.  5. Right atrial size was not well visualized.  6. The pericardium was not well visualized.  7. The mitral valve was not well visualized. not assessed mitral valve regurgitation.  8. The tricuspid valve is not well visualized. Tricuspid valve regurgitation not assessed.  9. Aortic valve regurgitation not evaluated. 10. The aortic valve was not well visualized. Aortic valve regurgitation not evaluated. 11. The pulmonic valve was not well visualized. Pulmonic valve regurgitation not assessed. 12. The aortic root was not well visualized. 13. The interatrial septum was not well visualized.   Labs/Other Tests and Data Reviewed:    EKG:  An ECG dated 02/28/19 was personally reviewed today and demonstrated:  SB 55bpm, one PVC, LAD, prior inferior infarct and anteroseptal infarct with TWI I, avL, biphasic STTW V4, TWI V5-V6  Recent Labs: 02/28/2019: Magnesium 2.2; NT-Pro BNP 249; TSH 0.711 04/10/2019: BUN 17; Creatinine, Ser 1.28; Hemoglobin  12.8; Platelets 187; Potassium 3.8; Sodium 144   Recent Lipid Panel Lab Results  Component Value Date/Time   CHOL 135 03/06/2017 05:25 AM   TRIG 86 03/06/2017 05:25 AM   HDL 41 03/06/2017 05:25 AM   CHOLHDL 3.3 03/06/2017 05:25 AM   LDLCALC 77 03/06/2017 05:25 AM   LDLDIRECT 59.9 06/03/2007 11:14 AM    Wt Readings from Last 3 Encounters:  04/11/19 203 lb (92.1 kg)  03/31/19 203 lb 9.6 oz (92.4 kg)  02/28/19 203 lb 9.6 oz (92.4 kg)     Objective:    Vital  Signs:  BP (!) 175/99   Pulse (!) 53   Ht 5\' 9"  (1.753 m)   Wt 203 lb (92.1 kg)   BMI 29.98 kg/m    VS reviewed. General - calm M in no acute distress Pulm - No labored breathing, no coughing during visit, no audible wheezing, speaking in full sentences Neuro - A+Ox3, no slurred speech, answers questions appropriately Psych - Pleasant affect     ASSESSMENT & PLAN:    1. Chronic combined CHF - tolerating Entresto titration fine with normal K and Cr by labs yesterday. His BP is elevated today but he literally just woke up when we called and has not yet taken his medication today. His BP at last OV prior to doubling Entresto was in the 150's systolic. I would like him to follow his BP twice a day over the weekend and send in his readings on Sunday night/Monday AM. Sent him MyChart activation code. If SBP still >130 systolic I will plan to add spironolactone. Will await those readings before arranging follow-up plan. Per our discussion I will also provide him a letter outlining his diagnosis of CHF and recommending he take breaks when needed.  2. Essential HTN - follow blood pressure readings from this weekend to guide next course of action. I will also have CMA look into why he has not yet heard back about his sleep study. 3. LV thrombus - continue Eliquis. Hgb on labs yesterday was down ever so slightly compared to previous check, but essentially the same was 2018. No bleeding reported. We can get a f/u Hgb down the road  contingent on our medication plan for #1. Tentative plan is for recheck echo in 08/2019. 4. CAD - no recent anginal symptoms reported. Continue ASA. Bleeding signs reviewed last OV along with recommendation to limit Mobic. Regarding lipids, will continue atorvastatin but would eventually consider increasing to 80mg  daily given his premature CAD history. Last LDL was 72. He had been having some atypical right rib soreness recently as per last visit so we will leave this medication at current dose but plan to revisit when CHF regimen has been optimized if he's clinically stable. I do not want to make too many changes at one time.  COVID-19 Education: Discussed at prior OV.  Time:   Today, I have spent 12 minutes with the patient with telehealth technology discussing the above problems.     Medication Adjustments/Labs and Tests Ordered: Current medicines are reviewed at length with the patient today.  Concerns regarding medicines are outlined above.   Follow Up:  Tentatively scheduled with Dr. Clifton JamesMcAlhany in February 2021, but anticipate we may see back sooner depending on his submission of BP readings on Monday.  Signed, Laurann Montanaayna N Bob Daversa, [name]  04/11/2019 11:29 AM    [facility]

## 2019-04-11 ENCOUNTER — Encounter: Payer: Self-pay | Admitting: Physician Assistant

## 2019-04-11 ENCOUNTER — Encounter: Payer: Self-pay | Admitting: *Deleted

## 2019-04-11 ENCOUNTER — Telehealth: Payer: Self-pay

## 2019-04-11 ENCOUNTER — Telehealth (INDEPENDENT_AMBULATORY_CARE_PROVIDER_SITE_OTHER): Payer: Federal, State, Local not specified - PPO | Admitting: Physician Assistant

## 2019-04-11 VITALS — BP 175/99 | HR 53 | Ht 69.0 in | Wt 203.0 lb

## 2019-04-11 DIAGNOSIS — I2510 Atherosclerotic heart disease of native coronary artery without angina pectoris: Secondary | ICD-10-CM

## 2019-04-11 DIAGNOSIS — I5042 Chronic combined systolic (congestive) and diastolic (congestive) heart failure: Secondary | ICD-10-CM

## 2019-04-11 DIAGNOSIS — I10 Essential (primary) hypertension: Secondary | ICD-10-CM

## 2019-04-11 DIAGNOSIS — I513 Intracardiac thrombosis, not elsewhere classified: Secondary | ICD-10-CM

## 2019-04-11 LAB — CBC
Hematocrit: 40.6 % (ref 37.5–51.0)
Hemoglobin: 12.8 g/dL — ABNORMAL LOW (ref 13.0–17.7)
MCH: 24 pg — ABNORMAL LOW (ref 26.6–33.0)
MCHC: 31.5 g/dL (ref 31.5–35.7)
MCV: 76 fL — ABNORMAL LOW (ref 79–97)
Platelets: 187 10*3/uL (ref 150–450)
RBC: 5.34 x10E6/uL (ref 4.14–5.80)
RDW: 14.7 % (ref 11.6–15.4)
WBC: 5 10*3/uL (ref 3.4–10.8)

## 2019-04-11 LAB — BASIC METABOLIC PANEL
BUN/Creatinine Ratio: 13 (ref 9–20)
BUN: 17 mg/dL (ref 6–24)
CO2: 23 mmol/L (ref 20–29)
Calcium: 8.8 mg/dL (ref 8.7–10.2)
Chloride: 106 mmol/L (ref 96–106)
Creatinine, Ser: 1.28 mg/dL — ABNORMAL HIGH (ref 0.76–1.27)
GFR calc Af Amer: 72 mL/min/{1.73_m2} (ref >59)
GFR calc non Af Amer: 62 mL/min/{1.73_m2} (ref >59)
Glucose: 113 mg/dL — ABNORMAL HIGH (ref 65–99)
Potassium: 3.8 mmol/L (ref 3.5–5.2)
Sodium: 144 mmol/L (ref 134–144)

## 2019-04-11 NOTE — Telephone Encounter (Signed)
YOUR CARDIOLOGY TEAM HAS ARRANGED FOR AN E-VISIT FOR YOUR APPOINTMENT - PLEASE REVIEW IMPORTANT INFORMATION BELOW SEVERAL DAYS PRIOR TO YOUR APPOINTMENT  Due to the recent COVID-19 pandemic, we are transitioning in-person office visits to tele-medicine visits in an effort to decrease unnecessary exposure to our patients, their families, and staff. These visits are billed to your insurance just like a normal visit is. We also encourage you to sign up for MyChart if you have not already done so. You will need a smartphone if possible. For patients that do not have this, we can still complete the visit using a regular telephone but do prefer a smartphone to enable video when possible. You may have a family member that lives with you that can help. If possible, we also ask that you have a blood pressure cuff and scale at home to measure your blood pressure, heart rate and weight prior to your scheduled appointment. Patients with clinical needs that need an in-person evaluation and testing will still be able to come to the office if absolutely necessary. If you have any questions, feel free to call our office.     YOUR PROVIDER WILL BE USING THE FOLLOWING PLATFORM TO COMPLETE YOUR VISIT: Doximity/[email or web address]  . IF USING MYCHART - How to Download the MyChart App to Your SmartPhone   - If Apple, go to [employer] and type in MyChart in the search bar and download the app. If Android, ask patient to go to [employer] and type in [place] in the search bar and download the app. The app is free but as with any other app downloads, your phone may require you to verify saved payment information or Apple/Android password.  - You will need to then log into the app with your MyChart username and password, and select [place] as your healthcare provider to link the account.  - When it is time for your visit, go to the MyChart app, find appointments, and click Begin Video Visit. Be sure to Select Allow for your device  to access the Microphone and Camera for your visit. You will then be connected, and your provider will be with you shortly.  **If you have any issues connecting or need assistance, please contact MyChart service desk [phone])**  **If using a computer, in order to ensure the best quality for your visit, you will need to use either of the following Internet Browsers: Insurance underwriter or [employer]**  . IF USING DOXIMITY or DOXY.ME - The staff will give you instructions on receiving your link to join the meeting the day of your visit.      THE DAY OF YOUR APPOINTMENT  Approximately 15 minutes prior to your scheduled appointment, you will receive a telephone call from one of [place] team - your caller ID may say "Unknown caller."  Our staff will confirm medications, vital signs for the day and any symptoms you may be experiencing. Please have this information available prior to the time of visit start. It may also be helpful for you to have a pad of paper and pen handy for any instructions given during your visit. They will also walk you through joining the smartphone meeting if this is a video visit.    CONSENT FOR TELE-HEALTH VISIT - PLEASE REVIEW  I hereby voluntarily request, consent and authorize [facility] and its employed or contracted physicians, physician assistants, nurse practitioners or other licensed health care professionals (the Practitioner), to provide me with telemedicine health care  services (the "Services") as deemed necessary by the treating Practitioner. I acknowledge and consent to receive the Services by the Practitioner via telemedicine. I understand that the telemedicine visit will involve communicating with the Practitioner through live audiovisual communication technology and the disclosure of certain medical information by electronic transmission. I acknowledge that I have been given the opportunity to request an in-person assessment or other  available alternative prior to the telemedicine visit and am voluntarily participating in the telemedicine visit.  I understand that I have the right to withhold or withdraw my consent to the use of telemedicine in the course of my care at any time, without affecting my right to future care or treatment, and that the Practitioner or I may terminate the telemedicine visit at any time. I understand that I have the right to inspect all information obtained and/or recorded in the course of the telemedicine visit and may receive copies of available information for a reasonable fee.  I understand that some of the potential risks of receiving the Services via telemedicine include:  Marland Kitchen Delay or interruption in medical evaluation due to technological equipment failure or disruption; . Information transmitted may not be sufficient (e.g. poor resolution of images) to allow for appropriate medical decision making by the Practitioner; and/or  . In rare instances, security protocols could fail, causing a breach of personal health information.  Furthermore, I acknowledge that it is my responsibility to provide information about my medical history, conditions and care that is complete and accurate to the best of my ability. I acknowledge that Practitioner's advice, recommendations, and/or decision may be based on factors not within their control, such as incomplete or inaccurate data provided by me or distortions of diagnostic images or specimens that may result from electronic transmissions. I understand that the practice of medicine is not an exact science and that Practitioner makes no warranties or guarantees regarding treatment outcomes. I acknowledge that I will receive a copy of this consent concurrently upon execution via email to the email address I last provided but may also request a printed copy by calling the office of [place].    I understand that my insurance will be billed for this visit.   I have  read or had this consent read to me. . I understand the contents of this consent, which adequately explains the benefits and risks of the Services being provided via telemedicine.  . I have been provided ample opportunity to ask questions regarding this consent and the Services and have had my questions answered to my satisfaction. . I give my informed consent for the services to be provided through the use of telemedicine in my medical care  By participating in this telemedicine visit I agree to the above.

## 2019-04-11 NOTE — Patient Instructions (Signed)
Medication Instructions:  Your physician recommends that you continue on your current medications as directed. Please refer to the Current Medication list given to you today.  *If you need a refill on your cardiac medications before your next appointment, please call your pharmacy*  Lab Work: [place] If you have labs (blood work) drawn today and your tests are completely normal, you will receive your results only by: Marland Kitchen MyChart Message (if you have MyChart) OR . A paper copy in the mail If you have any lab test that is abnormal or we need to change your treatment, we will call you to review the results.  Testing/Procedures: NONE ORDERED TODAY  Follow-Up: At [facility], you and your health needs are our priority.  As part of our continuing mission to provide you with exceptional heart care, we have created designated Provider Care Teams.  These Care Teams include your primary Cardiologist (physician) and Advanced Practice Providers (APPs -  Physician Assistants and Nurse Practitioners) who all work together to provide you with the care you need, when you need it.  Your next appointment:   06/02/19   The format for your next appointment:   In Person  Provider:   Lauree Chandler, MD  Other Instructions A WORK NOTE IS [place], Saturday AND Sunday TWICE A DAY. Sunday NIGHT OR Monday MORNING SEND BP READINGS THROUGH MY CHART

## 2019-04-18 ENCOUNTER — Telehealth: Payer: Self-pay | Admitting: *Deleted

## 2019-04-18 NOTE — Telephone Encounter (Signed)
Request for [place] form signed by Dr. Angelena Form. I left VM for patient to call regarding this.  I will give to medical records to follow up.

## 2019-04-28 ENCOUNTER — Other Ambulatory Visit: Payer: Self-pay

## 2019-04-28 MED ORDER — NITROGLYCERIN 0.4 MG SL SUBL: SUBLINGUAL_TABLET | SUBLINGUAL | 5 refills | Status: DC

## 2019-06-02 ENCOUNTER — Encounter: Payer: Self-pay | Admitting: Cardiovascular Disease

## 2019-06-02 ENCOUNTER — Ambulatory Visit: Payer: Federal, State, Local not specified - PPO | Admitting: Cardiovascular Disease

## 2019-06-02 ENCOUNTER — Other Ambulatory Visit: Payer: Self-pay

## 2019-06-02 VITALS — BP 130/88 | HR 59 | Ht 69.0 in | Wt 198.0 lb

## 2019-06-02 DIAGNOSIS — I255 Ischemic cardiomyopathy: Secondary | ICD-10-CM

## 2019-06-02 DIAGNOSIS — I25118 Atherosclerotic heart disease of native coronary artery with other forms of angina pectoris: Secondary | ICD-10-CM

## 2019-06-02 DIAGNOSIS — E7800 Pure hypercholesterolemia, unspecified: Secondary | ICD-10-CM

## 2019-06-02 DIAGNOSIS — I513 Intracardiac thrombosis, not elsewhere classified: Secondary | ICD-10-CM

## 2019-06-02 DIAGNOSIS — I10 Essential (primary) hypertension: Secondary | ICD-10-CM

## 2019-06-02 NOTE — Progress Notes (Signed)
Chief Complaint  Patient presents with  . Follow-up    CAD     History of Present Illness: 59 yo male with history of CAD, ischemic cardiomyopathy, LV thrombus, chronic combined CHF, HTN, HLD, sinus bradycardia, stage 2 CKD and sleep apnea who is here today for cardiac follow up. He had a bare metal stent placed in the LAD in 2008. Cardiac cath in June 2014 and he had patent LAD stent, 50% mid Circumflex stenosis and severe disease in the RCA. A drug eluting stent was placed in the proximal RCA. Echo in 2009 with LVEF=35-40%, periapical AK, ant HK, mild LAE. He did not tolerate Ace-inhibitors due to cough. He is not on a beta blocker due to bradycardia. He did not tolerate ARB secondary to dizziness. I saw him 06/05/13 and he c/o SOB and sharp left sided chest pains as well as fatigue. I arranged a stress myoview on 06/24/13 which did not show ischemia, LVEF=34%. I saw him on 02/01/17 and he had c/o chest pain and dyspnea on exertion. Nuclear stress test 02/16/17 with scar in the anteroseptal and apical walls with ischemia noted. Cardiac cath 02/26/17 with chronic occlusion RCA, patent proximal LAD stent and severe stenosis distal Circumflex artery. A drug eluting stent was placed in the distal Circumflex artery. He was seen in our office October 2020 with increasing dyspnea on exertion but no chest pain. Echo 03/06/19 with LVEF=35-40% with apical akinesis. F/U with Definity showed LV thrombus. He was started on Eliquis and Entresto. He has not tolerated beta blockers due to bradycardia.   He is here today for follow up. The patient denies any chest pain, dyspnea, palpitations, lower extremity edema, orthopnea, PND, dizziness, near syncope or syncope.  .  Primary Care Physician: Clovis Riley, Elbert Ewings.[name], MD  Past Medical History:  Diagnosis Date  . Allergic rhinitis   . Anxiety   . Arthritis    "lower back, right thumb, knees" (10/04/2012)  . Asthma    "grew out of it" (10/04/2012)  . CKD (chronic kidney  disease), stage II   . Coronary artery disease    a. s/p prior stent to LAD;  b. LHC 6/14: DES to pRCA. c. DES to dCx 01/2017 with residual disease.  . Depression   . Hyperlipidemia   . Hypertension   . Ischemic cardiomyopathy   . LV (left ventricular) mural thrombus   . MI (myocardial infarction) (HCC) 03/2007  . OSA (obstructive sleep apnea)    mild  . Pneumonia    "as a child" (10/04/2012)  . Sinus bradycardia     Past Surgical History:  Procedure Laterality Date  . ARTHROPLASTY Right 1993   'crushed; removed bone fragments" (10/04/2012)  . CARDIAC CATHETERIZATION  2010  . CORONARY ANGIOPLASTY WITH STENT PLACEMENT  2008; 10/04/2012   "1 + 1" (10/04/2012)  . CORONARY STENT INTERVENTION N/A 02/26/2017   Procedure: CORONARY STENT INTERVENTION;  Surgeon: Kathleene Hazel, MD;  Location: [facility];  Service: Cardiovascular;  Laterality: N/A;  . EXPLORATORY LAPAROTOMY  1990's?   "went in to repair hernia; found fatty tissue instead; no hernia repair" (10/04/2012)  . INTRAVASCULAR PRESSURE WIRE/FFR STUDY N/A 02/26/2017   Procedure: INTRAVASCULAR PRESSURE WIRE/FFR STUDY;  Surgeon: Kathleene Hazel, MD;  Location: [facility];  Service: Cardiovascular;  Laterality: N/A;  . LEFT HEART CATH AND CORONARY ANGIOGRAPHY N/A 02/26/2017   Procedure: LEFT HEART CATH AND CORONARY ANGIOGRAPHY;  Surgeon: Kathleene Hazel, MD;  Location: [facility] CV  [facility];  Service: Cardiovascular;  Laterality: N/A;  . LEFT HEART CATHETERIZATION WITH CORONARY ANGIOGRAM N/A 10/04/2012   Procedure: LEFT HEART CATHETERIZATION WITH CORONARY ANGIOGRAM;  Surgeon: Sherren Mocha, MD;  Location: [facility] CATH LAB;  Service: Cardiovascular;  Laterality: N/A;    Current Outpatient Medications  Medication Sig Dispense Refill  . acetaminophen-codeine (TYLENOL #3) 300-30 MG tablet Take 1 tablet by mouth as needed for moderate pain.     Marland Kitchen amoxicillin (AMOXIL) 500 MG capsule TAKE 4 CAPSULES ONE HOUR PRIOR TO  [facility] DENTAL APPOINTMENT    . apixaban (ELIQUIS) 5 MG TABS tablet Take 1 tablet (5 mg total) by mouth 2 (two) times daily. 180 tablet 3  . aspirin EC 81 MG tablet Take 81 mg by mouth daily with breakfast.    . atorvastatin (LIPITOR) 40 MG tablet Take 1 tablet (40 mg total) daily by mouth. 30 tablet 2  . Azelastine HCl 137 MCG/SPRAY SOLN Place 1 spray into both nostrils 2 (two) times daily. As needed    . citalopram (CELEXA) 20 MG tablet Take 20 mg by mouth daily with breakfast.     . finasteride (PROSCAR) 5 MG tablet Take 5 mg by mouth daily.    . isosorbide mononitrate (IMDUR) 30 MG 24 hr tablet TAKE 1 TABLET BY MOUTH EVERY DAY 90 tablet 1  . meloxicam (MOBIC) 15 MG tablet Take 15 mg by mouth daily as needed for pain.     . nitroGLYCERIN (NITROSTAT) 0.4 MG SL tablet PLACE 1 TABLET UNDER THE TONGUE EVERY 5 MINUTES AS NEEDED FOR CHEST PAIN. 25 tablet 5  . sacubitril-valsartan (ENTRESTO) 97-103 MG Take 1 tablet by mouth 2 (two) times daily. 180 tablet 3  . tamsulosin (FLOMAX) 0.4 MG CAPS capsule Take 0.4 mg by mouth daily with breakfast.      No current facility-administered medications for this visit.    Allergies  Allergen Reactions  . Adhesive [Tape] Other (See Comments)    SKIN SCARRING/IRRITATION. PAPER TAPE IS OKAY    Social History   Socioeconomic History  . Marital status: Married    Spouse name: Not on file  . Number of children: 1  . Years of education: Not on file  . Highest education level: Not on file  Occupational History    Employer: [employer]  Tobacco Use  . Smoking status: Former Smoker    Packs/day: 0.50    Years: 20.00    Pack years: 10.00    Types: Cigarettes    Quit date: 12/31/1998    Years since quitting: 20.4  . Smokeless tobacco: Never Used  Substance and Sexual Activity  . Alcohol use: Yes    Comment: 10/04/2012 "1 beer plus 2 mixed drinks once/month"  . Drug use: Yes    Frequency: 14.0 times per week    Types: Marijuana    Comment: smokes 2  joints/day  . Sexual activity: Yes  Other Topics Concern  . Not on file  Social History Narrative  . Not on file   Social Determinants of Health   Financial Resource Strain:   . Difficulty of Paying Living Expenses: Not on file  Food Insecurity:   . Worried About Charity fundraiser in the Last Year: Not on file  . Ran Out of Food in the Last Year: Not on file  Transportation Needs:   . Lack of Transportation (Medical): Not on file  . Lack of Transportation (Non-Medical): Not on file  Physical Activity:   . Days of Exercise  per Week: Not on file  . Minutes of Exercise per Session: Not on file  Stress:   . Feeling of Stress : Not on file  Social Connections:   . Frequency of Communication with Friends and Family: Not on file  . Frequency of Social Gatherings with Friends and Family: Not on file  . Attends Religious Services: Not on file  . Active Member of Clubs or Organizations: Not on file  . Attends Banker Meetings: Not on file  . Marital Status: Not on file  Intimate Partner Violence:   . Fear of Current or Ex-Partner: Not on file  . Emotionally Abused: Not on file  . Physically Abused: Not on file  . Sexually Abused: Not on file    Family History  Problem Relation Age of Onset  . Atrial fibrillation Mother        irregular heart beats  . Hypertension Mother   . Diabetes type II Mother   . CAD Maternal Grandfather   . CAD Maternal Grandmother     Review of Systems:  As stated in the HPI and otherwise negative.   BP 130/88   Pulse (!) 59   Ht 5\' 9"  (1.753 m)   Wt 198 lb (89.8 kg)   SpO2 97%   BMI 29.24 kg/m   Physical Examination:  General: Well developed, well nourished, NAD  HEENT: OP clear, mucus membranes moist  SKIN: warm, dry. No rashes. Neuro: No focal deficits  Musculoskeletal: Muscle strength 5/5 all ext  Psychiatric: Mood and affect normal  Neck: No JVD, no carotid bruits, no thyromegaly, no lymphadenopathy.  Lungs:Clear  bilaterally, no wheezes, rhonci, crackles Cardiovascular: Regular rate and rhythm. No murmurs, gallops or rubs. Abdomen:Soft. Bowel sounds present. Non-tender.  Extremities: No lower extremity edema. Pulses are 2 + in the bilateral DP/PT.  Echo 03/06/19: 1. Left ventricular ejection fraction, by visual estimation, is 35 to 40%  with apical dyskinesis or akinesis. The apex is not well visualized. The  left ventricle has normal function. There is no left ventricular  hypertrophy.  2. Left ventricular diastolic parameters are consistent with Grade II  diastolic dysfunction (pseudonormalization).  3. Global right ventricle has normal systolic function.The right  ventricular size is mildly enlarged. No increase in right ventricular wall  thickness.  4. Left atrial size was moderately dilated.  5. Right atrial size was normal.  6. The mitral valve is normal in structure. Trace mitral valve  regurgitation. No evidence of mitral stenosis.  7. The tricuspid valve is normal in structure. Tricuspid valve  regurgitation is not demonstrated.  8. The aortic valve is normal in structure. Aortic valve regurgitation is  not visualized. No evidence of aortic valve sclerosis or stenosis.  9. The pulmonic valve was normal in structure. Pulmonic valve  regurgitation is not visualized.  10. The inferior vena cava is normal in size with greater than 50%  respiratory variability, suggesting right atrial pressure of 3 mmHg.  11. Recommend limited study with definity contrast to rule out apical  thrombus.   Cardiac cath 02/26/17:  Ost RCA to Prox RCA lesion, 40 %stenosed.  Prox RCA lesion, 60 %stenosed.  Mid RCA lesion, 100 %stenosed.  1st Mrg lesion, 30 %stenosed.  Mid Cx lesion, 75 %stenosed.  Ost LAD to Prox LAD lesion, 20 %stenosed.  1st Diag lesion, 60 %stenosed.   1. Triple vessel CAD 2. Patent stent proximal LAD with minimal in stent restenosis 3. Severe stenosis distal  Circumflex artery 4. Successful  PTCA/DES x 1 distal Circumflex 5. Patent proximal RCA stent followed by chronic total occlusion of RCA in the mid segment. The distal RCA fills from left to right collaterals.   Diagnostic Diagram      PCI of the distal Circumflex was peformed. Xience Sierra 2.75 x 12 mm stent placed.   EKG:  EKG is not ordered today. The ekg ordered today demonstrates    Recent Labs: 02/28/2019: Magnesium 2.2; NT-Pro BNP 249; TSH 0.711 04/10/2019: BUN 17; Creatinine, Ser 1.28; Hemoglobin 12.8; Platelets 187; Potassium 3.8; Sodium 144   Lipid Panel Followed in primary care Lipid Panel     Component Value Date/Time   CHOL 135 03/06/2017 0525   TRIG 86 03/06/2017 0525   HDL 41 03/06/2017 0525   CHOLHDL 3.3 03/06/2017 0525   VLDL 17 03/06/2017 0525   LDLCALC 77 03/06/2017 0525   LDLDIRECT 59.9 06/03/2007 1114     Wt Readings from Last 3 Encounters:  06/02/19 198 lb (89.8 kg)  04/11/19 203 lb (92.1 kg)  03/31/19 203 lb 9.6 oz (92.4 kg)     Other studies Reviewed: Additional studies/ records that were reviewed today include: . Review of the above records demonstrates:   Assessment and Plan:   1. CAD with chronic stable angina: No chest pain. Continue ASA, statin and Imdur. He does not tolerate beta blockers due to bradycardia.   2. HTN: BP is controlled. No changes  3. HLD: Lipids followed in primary care. Continue statin  4. Ischemic cardiomyopathy: LVEF=35-40% by echo November 2020. He is tolerating Entresto. He has not tolerated beta blockers due to bradycardia.     5. LV thrombus: Continue Eliquis.   This did not include the time spent discussing his cardiac disease.   Current medicines are reviewed at length with the patient today.  The patient does not have concerns regarding medicines.  The following changes have been made:  no change  Labs/ tests ordered today include:   No orders of the defined types were placed in this  encounter.   Disposition:   FU with me in 6 months.   Signed, Verne Carrow, MD 06/02/2019 11:50 AM    [facility] [address] [place], [place], [place]  [address] Phone: [phone]; Fax: [phone]

## 2019-06-02 NOTE — Patient Instructions (Signed)
Medication Instructions:  Your physician recommends that you continue on your current medications as directed. Please refer to the Current Medication list given to you today.  *If you need a refill on your cardiac medications before your next appointment, please call your pharmacy*   Lab Work: None Ordered If you have labs (blood work) drawn today and your tests are completely normal, you will receive your results only by: . MyChart Message (if you have MyChart) OR . A paper copy in the mail If you have any lab test that is abnormal or we need to change your treatment, we will call you to review the results.   Testing/Procedures: None Ordered   Follow-Up: At [facility], you and your health needs are our priority.  As part of our continuing mission to provide you with exceptional heart care, we have created designated Provider Care Teams.  These Care Teams include your primary Cardiologist (physician) and Advanced Practice Providers (APPs -  Physician Assistants and Nurse Practitioners) who all work together to provide you with the care you need, when you need it.   Your next appointment:   6 month(s)  The format for your next appointment:   In Person  Provider:   You may see Christopher McAlhany, MD or one of the following Advanced Practice Providers on your designated Care Team:    Dayna Dunn, [name]  Michele Lenze, [name]     

## 2019-07-02 ENCOUNTER — Other Ambulatory Visit: Payer: Self-pay

## 2019-07-02 DIAGNOSIS — R109 Unspecified abdominal pain: Secondary | ICD-10-CM | POA: Diagnosis not present

## 2019-07-02 DIAGNOSIS — I2510 Atherosclerotic heart disease of native coronary artery without angina pectoris: Secondary | ICD-10-CM | POA: Diagnosis not present

## 2019-07-02 DIAGNOSIS — F329 Major depressive disorder, single episode, unspecified: Secondary | ICD-10-CM | POA: Diagnosis not present

## 2019-07-02 DIAGNOSIS — I10 Essential (primary) hypertension: Secondary | ICD-10-CM | POA: Diagnosis not present

## 2019-07-02 DIAGNOSIS — M545 Low back pain: Secondary | ICD-10-CM | POA: Diagnosis not present

## 2019-07-03 ENCOUNTER — Other Ambulatory Visit: Payer: Self-pay | Admitting: Cardiovascular Disease

## 2019-07-09 ENCOUNTER — Ambulatory Visit
Admission: RE | Admit: 2019-07-09 | Discharge: 2019-07-09 | Disposition: A | Discharge status: Home / Self Care | Payer: Federal, State, Local not specified - PPO | Source: Ambulatory Visit | Attending: Family Medicine | Admitting: Family Medicine

## 2019-07-09 DIAGNOSIS — R109 Unspecified abdominal pain: Secondary | ICD-10-CM | POA: Diagnosis not present

## 2019-08-05 DIAGNOSIS — I10 Essential (primary) hypertension: Secondary | ICD-10-CM | POA: Diagnosis not present

## 2019-08-05 DIAGNOSIS — R079 Chest pain, unspecified: Secondary | ICD-10-CM | POA: Diagnosis not present

## 2019-08-05 DIAGNOSIS — R0789 Other chest pain: Secondary | ICD-10-CM | POA: Diagnosis not present

## 2019-08-05 DIAGNOSIS — I2510 Atherosclerotic heart disease of native coronary artery without angina pectoris: Secondary | ICD-10-CM | POA: Diagnosis not present

## 2019-08-05 DIAGNOSIS — R918 Other nonspecific abnormal finding of lung field: Secondary | ICD-10-CM | POA: Diagnosis not present

## 2019-10-24 ENCOUNTER — Other Ambulatory Visit: Payer: Self-pay

## 2019-10-24 ENCOUNTER — Ambulatory Visit (HOSPITAL_COMMUNITY): Payer: Federal, State, Local not specified - PPO | Attending: Cardiology

## 2019-10-24 DIAGNOSIS — Z79899 Other long term (current) drug therapy: Secondary | ICD-10-CM | POA: Diagnosis not present

## 2019-10-24 DIAGNOSIS — I255 Ischemic cardiomyopathy: Secondary | ICD-10-CM | POA: Insufficient documentation

## 2019-10-24 MED ORDER — PERFLUTREN LIPID MICROSPHERE
1.0000 mL | INTRAVENOUS | Status: AC | PRN
  Administered 2019-10-24: 2 mL via INTRAVENOUS

## 2019-12-24 ENCOUNTER — Ambulatory Visit: Payer: Federal, State, Local not specified - PPO | Admitting: Cardiovascular Disease

## 2019-12-24 ENCOUNTER — Other Ambulatory Visit: Payer: Self-pay

## 2019-12-24 ENCOUNTER — Encounter: Payer: Self-pay | Admitting: Cardiovascular Disease

## 2019-12-24 VITALS — BP 140/96 | HR 53 | Ht 69.0 in | Wt 196.0 lb

## 2019-12-24 DIAGNOSIS — I513 Intracardiac thrombosis, not elsewhere classified: Secondary | ICD-10-CM | POA: Diagnosis not present

## 2019-12-24 DIAGNOSIS — I25118 Atherosclerotic heart disease of native coronary artery with other forms of angina pectoris: Secondary | ICD-10-CM

## 2019-12-24 DIAGNOSIS — I10 Essential (primary) hypertension: Secondary | ICD-10-CM | POA: Diagnosis not present

## 2019-12-24 DIAGNOSIS — E7800 Pure hypercholesterolemia, unspecified: Secondary | ICD-10-CM | POA: Diagnosis not present

## 2019-12-24 DIAGNOSIS — I255 Ischemic cardiomyopathy: Secondary | ICD-10-CM

## 2019-12-24 NOTE — Progress Notes (Signed)
Chief Complaint  Patient presents with  . Follow-up    CAD    History of Present Illness: 59 yo male with history of CAD, ischemic cardiomyopathy, LV thrombus, chronic combined CHF, HTN, HLD, sinus bradycardia, stage 2 CKD and sleep apnea who is here today for cardiac follow up. He had a bare metal stent placed in the LAD in 2008. Cardiac cath in June 2014 and he had patent LAD stent, 50% mid Circumflex stenosis and severe disease in the RCA. A drug eluting stent was placed in the proximal RCA. Echo in 2009 with LVEF=35-40%, periapical AK, ant HK, mild LAE. He did not tolerate Ace-inhibitors due to cough. He is not on a beta blocker due to bradycardia. He did not tolerate ARB secondary to dizziness. I saw him 06/05/13 and he c/o SOB and sharp left sided chest pains as well as fatigue. I arranged a stress myoview on 06/24/13 which did not show ischemia, LVEF=34%. I saw him on 02/01/17 and he had c/o chest pain and dyspnea on exertion. Nuclear stress test 02/16/17 with scar in the anteroseptal and apical walls with ischemia noted. Cardiac cath 02/26/17 with chronic occlusion RCA, patent proximal LAD stent and severe stenosis distal Circumflex artery. A drug eluting stent was placed in the distal Circumflex artery. He was seen in our office October 2020 with increasing dyspnea on exertion but no chest pain. Echo 03/06/19 with LVEF=35-40% with apical akinesis. F/U with Def[facility] showed LV thrombus. He was started on Eliquis and Entresto. Repeat echo June 2021 with LVEF=35-40% and apical thrombus. No valve disease. He has not tolerated beta blockers due to bradycardia.   He is here today for follow up. The patient denies any dyspnea, palpitations, lower extremity edema, orthopnea, PND, dizziness, near syncope or syncope. He has been having resting sharp chest pains at times. This lasts for several seconds. No associated dyspnea like his prior cardiac pain.   Primary Care Physician: Clovis Riley, L.[name], MD  Past  Medical History:  Diagnosis Date  . Allergic rhinitis   . Anxiety   . Arthritis    "lower back, right thumb, knees" (10/04/2012)  . Asthma    "grew out of it" (10/04/2012)  . CKD (chronic kidney disease), stage II   . Coronary artery disease    a. s/p prior stent to LAD;  b. LHC 6/14: DES to pRCA. c. DES to dCx 01/2017 with residual disease.  . Depression   . Hyperlipidemia   . Hypertension   . Ischemic cardiomyopathy   . LV (left ventricular) mural thrombus   . MI (myocardial infarction) (HCC) 03/2007  . OSA (obstructive sleep apnea)    mild  . Pneumonia    "as a child" (10/04/2012)  . Sinus bradycardia     Past Surgical History:  Procedure Laterality Date  . ARTHROPLASTY Right 1993   'crushed; removed bone fragments" (10/04/2012)  . CARDIAC CATHETERIZATION  2010  . CORONARY ANGIOPLASTY WITH STENT PLACEMENT  2008; 10/04/2012   "1 + 1" (10/04/2012)  . CORONARY STENT INTERVENTION N/A 02/26/2017   Procedure: CORONARY STENT INTERVENTION;  Surgeon: Kathleene Hazel, MD;  Location: [facility];  Service: Cardiovascular;  Laterality: N/A;  . EXPLORATORY LAPAROTOMY  1990's?   "went in to repair hernia; found fatty tissue instead; no hernia repair" (10/04/2012)  . INTRAVASCULAR PRESSURE WIRE/FFR STUDY N/A 02/26/2017   Procedure: INTRAVASCULAR PRESSURE WIRE/FFR STUDY;  Surgeon: Kathleene Hazel, MD;  Location: [facility];  Service: Cardiovascular;  Laterality:  N/A;  . LEFT HEART CATH AND CORONARY ANGIOGRAPHY N/A 02/26/2017   Procedure: LEFT HEART CATH AND CORONARY ANGIOGRAPHY;  Surgeon: Kathleene Hazel, MD;  Location: [facility];  Service: Cardiovascular;  Laterality: N/A;  . LEFT HEART CATHETERIZATION WITH CORONARY ANGIOGRAM N/A 10/04/2012   Procedure: LEFT HEART CATHETERIZATION WITH CORONARY ANGIOGRAM;  Surgeon: Tonny Bollman, MD;  Location: [facility] CATH LAB;  Service: Cardiovascular;  Laterality: N/A;    Current Outpatient Medications  Medication Sig  Dispense Refill  . acetaminophen-codeine (TYLENOL #3) 300-30 MG tablet Take 1 tablet by mouth as needed for moderate pain.     Marland Kitchen amLODipine (NORVASC) 10 MG tablet Take 10 mg by mouth daily.    Marland Kitchen apixaban (ELIQUIS) 5 MG TABS tablet Take 1 tablet (5 mg total) by mouth 2 (two) times daily. 180 tablet 3  . aspirin EC 81 MG tablet Take 81 mg by mouth daily with breakfast.    . atorvastatin (LIPITOR) 40 MG tablet Take 1 tablet (40 mg total) daily by mouth. 30 tablet 2  . Azelastine HCl 137 MCG/SPRAY SOLN Place 1 spray into both nostrils 2 (two) times daily. As needed    . citalopram (CELEXA) 20 MG tablet Take 20 mg by mouth daily with breakfast.     . finasteride (PROSCAR) 5 MG tablet Take 5 mg by mouth daily.    . isosorbide mononitrate (IMDUR) 30 MG 24 hr tablet TAKE 1 TABLET BY MOUTH EVERY DAY 90 tablet 3  . meloxicam (MOBIC) 15 MG tablet Take 15 mg by mouth daily as needed for pain.     . nitroGLYCERIN (NITROSTAT) 0.4 MG SL tablet PLACE 1 TABLET UNDER THE TONGUE EVERY 5 MINUTES AS NEEDED FOR CHEST PAIN. 25 tablet 5  . sacubitril-valsartan (ENTRESTO) 97-103 MG Take 1 tablet by mouth 2 (two) times daily. 180 tablet 3  . tamsulosin (FLOMAX) 0.4 MG CAPS capsule Take 0.4 mg by mouth daily with breakfast.     . amoxicillin (AMOXIL) 500 MG capsule TAKE 4 CAPSULES ONE HOUR PRIOR TO [facility] DENTAL APPOINTMENT     No current facility-administered medications for this visit.    Allergies  Allergen Reactions  . Adhesive [Tape] Other (See Comments)    SKIN SCARRING/IRRITATION. PAPER TAPE IS OKAY    Social History   Socioeconomic History  . Marital status: Married    Spouse name: Not on file  . Number of children: 1  . Years of education: Not on file  . Highest education level: Not on file  Occupational History    Employer: [employer]  Tobacco Use  . Smoking status: Former Smoker    Packs/day: 0.50    Years: 20.00    Pack years: 10.00    Types: Cigarettes    Quit date: 12/31/1998    Years  since quitting: 20.9  . Smokeless tobacco: Never Used  Vaping Use  . Vaping Use: Never used  Substance and Sexual Activity  . Alcohol use: Yes    Comment: 10/04/2012 "1 beer plus 2 mixed drinks once/month"  . Drug use: Yes    Frequency: 14.0 times per week    Types: Marijuana    Comment: smokes 2 joints/day  . Sexual activity: Yes  Other Topics Concern  . Not on file  Social History Narrative  . Not on file   Social Determinants of Health   Financial Resource Strain:   . Difficulty of Paying Living Expenses: Not on file  Food Insecurity:   . Worried About  Running Out of Food in the Last Year: Not on file  . Ran Out of Food in the Last Year: Not on file  Transportation Needs:   . Lack of Transportation (Medical): Not on file  . Lack of Transportation (Non-Medical): Not on file  Physical Activity:   . Days of Exercise per Week: Not on file  . Minutes of Exercise per Session: Not on file  Stress:   . Feeling of Stress : Not on file  Social Connections:   . Frequency of Communication with Friends and Family: Not on file  . Frequency of Social Gatherings with Friends and Family: Not on file  . Attends Religious Services: Not on file  . Active Member of Clubs or Organizations: Not on file  . Attends Banker Meetings: Not on file  . Marital Status: Not on file  Intimate Partner Violence:   . Fear of Current or Ex-Partner: Not on file  . Emotionally Abused: Not on file  . Physically Abused: Not on file  . Sexually Abused: Not on file    Family History  Problem Relation Age of Onset  . Atrial fibrillation Mother        irregular heart beats  . Hypertension Mother   . Diabetes type II Mother   . CAD Maternal Grandfather   . CAD Maternal Grandmother     Review of Systems:  As stated in the HPI and otherwise negative.   BP (!) 140/96   Pulse (!) 53   Ht 5\' 9"  (1.753 m)   Wt 196 lb (88.9 kg)   BMI 28.94 kg/m   Physical Examination:  General: Well  developed, well nourished, NAD  HEENT: OP clear, mucus membranes moist  SKIN: warm, dry. No rashes. Neuro: No focal deficits  Musculoskeletal: Muscle strength 5/5 all ext  Psychiatric: Mood and affect normal  Neck: No JVD, no carotid bruits, no thyromegaly, no lymphadenopathy.  Lungs:Clear bilaterally, no wheezes, rhonci, crackles Cardiovascular: Regular rate and rhythm. No murmurs, gallops or rubs. Abdomen:Soft. Bowel sounds present. Non-tender.  Extremities: No lower extremity edema. Pulses are 2 + in the bilateral DP/PT.  Echo June 2021:  1. Left ventricular ejection fraction, by estimation, is 35 to 40%. The  left ventricle has moderately decreased function. The left ventricle  demonstrates regional wall motion abnormalities (see scoring  diagram/findings for description). There is mild  concentric left ventricular hypertrophy. Left ventricular diastolic  parameters are consistent with Grade II diastolic dysfunction  (pseudonormalization).  2. Right ventricular systolic function is normal. The right ventricular  size is normal. Tricuspid regurgitation signal is inadequate for assessing  PA pressure.  3. Left atrial size was mild to moderately dilated.  4. The mitral valve is normal in structure. No evidence of mitral valve  regurgitation.  5. The aortic valve is normal in structure. Aortic valve regurgitation is  not visualized.   Cardiac cath 02/26/17:  Ost RCA to Prox RCA lesion, 40 %stenosed.  Prox RCA lesion, 60 %stenosed.  Mid RCA lesion, 100 %stenosed.  1st Mrg lesion, 30 %stenosed.  Mid Cx lesion, 75 %stenosed.  Ost LAD to Prox LAD lesion, 20 %stenosed.  1st Diag lesion, 60 %stenosed.   1. Triple vessel CAD 2. Patent stent proximal LAD with minimal in stent restenosis 3. Severe stenosis distal Circumflex artery 4. Successful PTCA/DES x 1 distal Circumflex 5. Patent proximal RCA stent followed by chronic total occlusion of RCA in the mid segment. The  distal RCA fills from left to  right collaterals.   Diagnostic Diagram      PCI of the distal Circumflex was peformed. Xience Sierra 2.75 x 12 mm stent placed.   EKG:  EKG is ordered today. The ekg ordered today demonstrates  Sinus brady, rate 53 bpm. Old inferior infarct, anteroseptal infarct-old. Chronic lateral T wave inversions.   Recent Labs: 02/28/2019: Magnesium 2.2; NT-Pro BNP 249; TSH 0.711 04/10/2019: BUN 17; Creatinine, Ser 1.28; Hemoglobin 12.8; Platelets 187; Potassium 3.8; Sodium 144   Lipid Panel Followed in primary care Lipid Panel     Component Value Date/Time   CHOL 135 03/06/2017 0525   TRIG 86 03/06/2017 0525   HDL 41 03/06/2017 0525   CHOLHDL 3.3 03/06/2017 0525   VLDL 17 03/06/2017 0525   LDLCALC 77 03/06/2017 0525   LDLDIRECT 59.9 06/03/2007 1114     Wt Readings from Last 3 Encounters:  12/24/19 196 lb (88.9 kg)  06/02/19 198 lb (89.8 kg)  04/11/19 203 lb (92.1 kg)     Other studies Reviewed: Additional studies/ records that were reviewed today include: . Review of the above records demonstrates:   Assessment and Plan:   1. CAD with chronic stable angina: He has no chest pain. Will continue ASA, statin, Imdur. He does not tolerate beta blockers due to bradycardia.   2. HTN: BP is well controlled. Continue current therapy  3. HLD: Lipids followed in primary care. LDL 72 in Sept 2020. Continue statin  4. Ischemic cardiomyopathy: LVEF=35-40% by echo June 2021. He is tolerating Entresto. He has not tolerated beta blockers due to bradycardia.   Continue Entresto.   5. LV thrombus: No LV thrombus on last echo but given apical akinesis and potential for future thrombus, will continue Eliquis.  He can proceed with a colonoscopy if necessary and hold his Eliquis.   This did not include the time spent discussing his cardiac disease.   Current medicines are reviewed at length with the patient today.  The patient does not have concerns regarding  medicines.  The following changes have been made:  no change  Labs/ tests ordered today include:   Orders Placed This Encounter  Procedures  . EKG 12-Lead    Disposition:   FU with me in 6 months.   Signed, Verne Carrow, MD 12/24/2019 3:50 PM    [facility] [address] [place], [place], [place]  [address] Phone: [phone]; Fax: [phone]

## 2019-12-24 NOTE — Patient Instructions (Signed)
Medication Instructions:  No changes *If you need a refill on your cardiac medications before your next appointment, please call your pharmacy*   Lab Work: none If you have labs (blood work) drawn today and your tests are completely normal, you will receive your results only by: . MyChart Message (if you have MyChart) OR . A paper copy in the mail If you have any lab test that is abnormal or we need to change your treatment, we will call you to review the results.   Testing/Procedures: none   Follow-Up: At [facility], you and your health needs are our priority.  As part of our continuing mission to provide you with exceptional heart care, we have created designated Provider Care Teams.  These Care Teams include your primary Cardiologist (physician) and Advanced Practice Providers (APPs -  Physician Assistants and Nurse Practitioners) who all work together to provide you with the care you need, when you need it.  We recommend signing up for the patient portal called "MyChart".  Sign up information is provided on this After Visit Summary.  MyChart is used to connect with patients for Virtual Visits (Telemedicine).  Patients are able to view lab/test results, encounter notes, upcoming appointments, etc.  Non-urgent messages can be sent to your provider as well.   To learn more about what you can do with MyChart, go to [email or web address].    Your next appointment:   6 month(s)  The format for your next appointment:   In Person  Provider:   You may see Christopher McAlhany, MD or one of the following Advanced Practice Providers on your designated Care Team:    Dayna Dunn, [name]  Michele Lenze, [name]  Other Instructions   

## 2020-01-01 ENCOUNTER — Telehealth: Payer: Self-pay | Admitting: Cardiovascular Disease

## 2020-01-01 NOTE — Telephone Encounter (Signed)
The patient's wife said the insurance has not changed.  She will come by and get copay card $10.  Placed at front desk.

## 2020-01-01 NOTE — Telephone Encounter (Signed)
Pt c/o medication issue:  1. Name of Medication: sacubitril-valsartan (ENTRESTO) 97-103 MG  2. How are you currently taking this medication (dosage and times per day)? As directed  3. Are you having a reaction (difficulty breathing--STAT)? no  4. What is your medication issue? Patient's wife states that this medication is going to cost them $900. She wants to know if we have a coupon they can use. Please advise.

## 2020-01-01 NOTE — Telephone Encounter (Signed)
**Note De-Identified [facility] Ingram Obfuscation** It looks like the pt has commercial ins so he can use a $10 Entresto co-pay card.

## 2020-01-06 DIAGNOSIS — M545 Low back pain: Secondary | ICD-10-CM | POA: Diagnosis not present

## 2020-01-06 DIAGNOSIS — Z125 Encounter for screening for malignant neoplasm of prostate: Secondary | ICD-10-CM | POA: Diagnosis not present

## 2020-01-06 DIAGNOSIS — F329 Major depressive disorder, single episode, unspecified: Secondary | ICD-10-CM | POA: Diagnosis not present

## 2020-01-06 DIAGNOSIS — E7800 Pure hypercholesterolemia, unspecified: Secondary | ICD-10-CM | POA: Diagnosis not present

## 2020-01-06 DIAGNOSIS — I2510 Atherosclerotic heart disease of native coronary artery without angina pectoris: Secondary | ICD-10-CM | POA: Diagnosis not present

## 2020-01-06 DIAGNOSIS — I10 Essential (primary) hypertension: Secondary | ICD-10-CM | POA: Diagnosis not present

## 2020-01-12 DIAGNOSIS — Z20828 Contact with and (suspected) exposure to other viral communicable diseases: Secondary | ICD-10-CM | POA: Diagnosis not present

## 2020-01-14 ENCOUNTER — Telehealth: Payer: Self-pay | Admitting: Cardiovascular Disease

## 2020-01-14 NOTE — Telephone Encounter (Signed)
Agree. Thanks. Chris 
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
## 2020-01-20 ENCOUNTER — Telehealth (INDEPENDENT_AMBULATORY_CARE_PROVIDER_SITE_OTHER): Payer: Federal, State, Local not specified - PPO | Admitting: Physician Assistant

## 2020-01-20 ENCOUNTER — Other Ambulatory Visit: Payer: Self-pay

## 2020-01-20 ENCOUNTER — Encounter: Payer: Self-pay | Admitting: Physician Assistant

## 2020-01-20 VITALS — Ht 69.0 in | Wt 196.0 lb

## 2020-01-20 DIAGNOSIS — I5042 Chronic combined systolic (congestive) and diastolic (congestive) heart failure: Secondary | ICD-10-CM

## 2020-01-20 DIAGNOSIS — I2510 Atherosclerotic heart disease of native coronary artery without angina pectoris: Secondary | ICD-10-CM

## 2020-01-20 DIAGNOSIS — I255 Ischemic cardiomyopathy: Secondary | ICD-10-CM

## 2020-01-20 DIAGNOSIS — I10 Essential (primary) hypertension: Secondary | ICD-10-CM | POA: Diagnosis not present
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
## 2020-01-20 NOTE — Patient Instructions (Signed)
Medication Instructions:  Your physician recommends that you continue on your current medications as directed. Please refer to the Current Medication list given to you today.  *If you need a refill on your cardiac medications before your next appointment, please call your pharmacy*   Lab Work: None ordered  If you have labs (blood work) drawn today and your tests are completely normal, you will receive your results only by: Marland Kitchen MyChart Message (if you have MyChart) OR . A paper copy in the mail If you have any lab test that is abnormal or we need to change your treatment, we will call you to review the results.   Testing/Procedures: None ordered   Follow-Up: At [facility], you and your health needs are our priority.  As part of our continuing mission to provide you with exceptional heart care, we have created designated Provider Care Teams.  These Care Teams include your primary Cardiologist (physician) and Advanced Practice Providers (APPs -  Physician Assistants and Nurse Practitioners) who all work together to provide you with the care you need, when you need it.  We recommend signing up for the patient portal called "MyChart".  Sign up information is provided on this After Visit Summary.  MyChart is used to connect with patients for Virtual Visits (Telemedicine).  Patients are able to view lab/test results, encounter notes, upcoming appointments, etc.  Non-urgent messages can be sent to your provider as well.   To learn more about what you can do with MyChart, go to [email or web address].    Your next appointment:   Keep your scheduled appointment with Dr. Clifton James in February   The format for your next appointment:   In Person  Provider:   Verne Carrow, MD   Other Instructions

## 2020-03-01 DIAGNOSIS — R0789 Other chest pain: Secondary | ICD-10-CM | POA: Diagnosis not present

## 2020-04-02 DIAGNOSIS — Z03818 Encounter for observation for suspected exposure to other biological agents ruled out: Secondary | ICD-10-CM | POA: Diagnosis not present

## 2020-04-24 ENCOUNTER — Other Ambulatory Visit: Payer: Self-pay | Admitting: Physician Assistant

## 2020-05-08 ENCOUNTER — Other Ambulatory Visit: Payer: Federal, State, Local not specified - PPO

## 2020-05-10 ENCOUNTER — Other Ambulatory Visit: Payer: Self-pay | Admitting: Physician Assistant

## 2020-05-10 NOTE — Telephone Encounter (Signed)
Prescription refill request for Eliquis received. Indication: Lv Thrombus Last office visit: 06/02/2019, [place] Scr: 1.14, 01/06/2020 Age: 60  Weight: 88.9 kg   Prescription refill sent

## 2020-05-21 DIAGNOSIS — Z20822 Contact with and (suspected) exposure to covid-19: Secondary | ICD-10-CM | POA: Diagnosis not present

## 2020-05-21 DIAGNOSIS — Z03818 Encounter for observation for suspected exposure to other biological agents ruled out: Secondary | ICD-10-CM | POA: Diagnosis not present

## 2020-06-28 ENCOUNTER — Ambulatory Visit: Payer: Federal, State, Local not specified - PPO | Admitting: Cardiovascular Disease

## 2020-06-28 NOTE — Progress Notes (Deleted)
No chief complaint on file.   History of Present Illness: 60 yo male with history of CAD, ischemic cardiomyopathy, LV thrombus, chronic combined CHF, HTN, HLD, sinus bradycardia, stage 2 CKD and sleep apnea who is here today for cardiac follow up. He had a bare metal stent placed in the LAD in 2008. Cardiac cath in June 2014 and he had patent LAD stent, 50% mid Circumflex stenosis and severe disease in the RCA. A drug eluting stent was placed in the proximal RCA. Echo in 2009 with LVEF=35-40%, periapical AK, ant HK, mild LAE. He did not tolerate Ace-inhibitors due to cough. He is not on a beta blocker due to bradycardia. He did not tolerate ARB secondary to dizziness. I saw him 06/05/13 and he c/o SOB and sharp left sided chest pains as well as fatigue. I arranged a stress myoview on 06/24/13 which did not show ischemia, LVEF=34%. I saw him on 02/01/17 and he had c/o chest pain and dyspnea on exertion. Nuclear stress test 02/16/17 with scar in the anteroseptal and apical walls with ischemia noted. Cardiac cath 02/26/17 with chronic occlusion RCA, patent proximal LAD stent and severe stenosis distal Circumflex artery. A drug eluting stent was placed in the distal Circumflex artery. He was seen in our office October 2020 with increasing dyspnea on exertion but no chest pain. Echo 03/06/19 with LVEF=35-40% with apical akinesis. F/U with Definity showed LV thrombus. He was started on Eliquis and Entresto. Repeat echo June 2021 with LVEF=35-40% and apical thrombus. No valve disease. He has not tolerated beta blockers due to bradycardia.   He is here today for follow up. The patient denies any chest pain, dyspnea, palpitations, lower extremity edema, orthopnea, PND, dizziness, near syncope or syncope.    Primary Care Physician: Clovis Riley, L.[name], MD  Past Medical History:  Diagnosis Date  . Allergic rhinitis   . Anxiety   . Arthritis    "lower back, right thumb, knees" (10/04/2012)  . Asthma    "grew out of  it" (10/04/2012)  . CKD (chronic kidney disease), stage II   . Coronary artery disease    a. s/p prior stent to LAD;  b. LHC 6/14: DES to pRCA. c. DES to dCx 01/2017 with residual disease.  . Depression   . Hyperlipidemia   . Hypertension   . Ischemic cardiomyopathy   . LV (left ventricular) mural thrombus   . MI (myocardial infarction) (HCC) 03/2007  . OSA (obstructive sleep apnea)    mild  . Pneumonia    "as a child" (10/04/2012)  . Sinus bradycardia     Past Surgical History:  Procedure Laterality Date  . ARTHROPLASTY Right 1993   'crushed; removed bone fragments" (10/04/2012)  . CARDIAC CATHETERIZATION  2010  . CORONARY ANGIOPLASTY WITH STENT PLACEMENT  2008; 10/04/2012   "1 + 1" (10/04/2012)  . CORONARY STENT INTERVENTION N/A 02/26/2017   Procedure: CORONARY STENT INTERVENTION;  Surgeon: Kathleene Hazel, MD;  [facility] INVAS[facility] CV LAB;  Service: Cardiovascular;  Laterality: N/A;  . EXPLORATORY LAPAROTOMY  1990's?   "went in to repair hernia; found fatty tissue instead; no hernia repair" (10/04/2012)  . INTRAVASCULAR PRESSURE WIRE/FFR STUDY N/A 02/26/2017   Procedure: INTRAVASCULAR PRESSURE WIRE/FFR STUDY;  Surgeon: Kathleene Hazel, MD;  Location: [facility] INVASIVE CV LAB;  Service: Cardiovascular;  Laterality: N/A;  . LEFT HEART CATH AND CORONARY ANGIOGRAPHY N/A 02/26/2017   Procedure: LEFT HEART CATH AND CORONARY ANGIOGRAPHY;  Surgeon: Kathleene Hazel, MD;  Location: [facility]  INVASIVE CV LAB;  Service: Cardiovascular;  Laterality: N/A;  . LEFT HEART CATHETERIZATION WITH CORONARY ANGIOGRAM N/A 10/04/2012   Procedure: LEFT HEART CATHETERIZATION WITH CORONARY ANGIOGRAM;  Surgeon: Tonny Bollman, MD;  Location: [facility] CATH LAB;  Service: Cardiovascular;  Laterality: N/A;    Current Outpatient Medications  Medication Sig Dispense Refill  . acetaminophen-codeine (TYLENOL #3) 300-30 MG tablet Take 1 tablet by mouth as needed for moderate pain.     Marland Kitchen amLODipine (NORVASC) 10 MG  tablet Take 10 mg by mouth daily.    Marland Kitchen amoxicillin (AMOXIL) 500 MG capsule TAKE 4 CAPSULES ONE HOUR PRIOR TO [facility] DENTAL APPOINTMENT    . aspirin EC 81 MG tablet Take 81 mg by mouth daily with breakfast.    . atorvastatin (LIPITOR) 40 MG tablet Take 1 tablet (40 mg total) daily by mouth. 30 tablet 2  . Azelastine HCl 137 MCG/SPRAY SOLN Place 1 spray into both nostrils 2 (two) times daily. As needed    . citalopram (CELEXA) 20 MG tablet Take 20 mg by mouth daily with breakfast.     . ELIQUIS 5 MG TABS tablet TAKE 1 TABLET BY MOUTH TWICE A DAY 60 tablet 5  . ENTRESTO 97-103 MG TAKE 1 TABLET BY MOUTH TWICE A DAY 180 tablet 2  . finasteride (PROSCAR) 5 MG tablet Take 5 mg by mouth daily.    . isosorbide mononitrate (IMDUR) 30 MG 24 hr tablet TAKE 1 TABLET BY MOUTH EVERY DAY 90 tablet 3  . meloxicam (MOBIC) 15 MG tablet Take 15 mg by mouth daily as needed for pain.     . nitroGLYCERIN (NITROSTAT) 0.4 MG SL tablet PLACE 1 TABLET UNDER THE TONGUE EVERY 5 MINUTES AS NEEDED FOR CHEST PAIN. 25 tablet 5  . tamsulosin (FLOMAX) 0.4 MG CAPS capsule Take 0.4 mg by mouth daily with breakfast.      No current facility-administered medications for this visit.    Allergies  Allergen Reactions  . Adhesive [Tape] Other (See Comments)    SKIN SCARRING/IRRITATION. PAPER TAPE IS OKAY    Social History   Socioeconomic History  . Marital status: Married    Spouse name: Not on file  . Number of children: 1  . Years of education: Not on file  . Highest education level: Not on file  Occupational History    Employer: [employer]  Tobacco Use  . Smoking status: Former Smoker    Packs/day: 0.50    Years: 20.00    Pack years: 10.00    Types: Cigarettes    Quit date: 12/31/1998    Years since quitting: 21.5  . Smokeless tobacco: Never Used  Vaping Use  . Vaping Use: Never used  Substance and Sexual Activity  . Alcohol use: Yes    Comment: 10/04/2012 "1 beer plus 2 mixed drinks once/month"  . Drug use:  Yes    Frequency: 14.0 times per week    Types: Marijuana    Comment: smokes 2 joints/day  . Sexual activity: Yes  Other Topics Concern  . Not on file  Social History Narrative  . Not on file   Social Determinants of Health   Financial Resource Strain: Not on file  Food Insecurity: Not on file  Transportation Needs: Not on file  Physical Activity: Not on file  Stress: Not on file  Social Connections: Not on file  Intimate Partner Violence: Not on file    Family History  Problem Relation Age of Onset  . Atrial fibrillation Mother  irregular heart beats  . Hypertension Mother   . Diabetes type II Mother   . CAD Maternal Grandfather   . CAD Maternal Grandmother     Review of Systems:  As stated in the HPI and otherwise negative.   There were no vitals taken for this visit.  Physical Examination:  General: Well developed, well nourished, NAD  HEENT: OP clear, mucus membranes moist  SKIN: warm, dry. No rashes. Neuro: No focal deficits  Musculoskeletal: Muscle strength 5/5 all ext  Psychiatric: Mood and affect normal  Neck: No JVD, no carotid bruits, no thyromegaly, no lymphadenopathy.  Lungs:Clear bilaterally, no wheezes, rhonci, crackles Cardiovascular: Regular rate and rhythm. No murmurs, gallops or rubs. Abdomen:Soft. Bowel sounds present. Non-tender.  Extremities: No lower extremity edema. Pulses are 2 + in the bilateral DP/PT.  Echo June 2021:  1. Left ventricular ejection fraction, by estimation, is 35 to 40%. The  left ventricle has moderately decreased function. The left ventricle  demonstrates regional wall motion abnormalities (see scoring  diagram/findings for description). There is mild  concentric left ventricular hypertrophy. Left ventricular diastolic  parameters are consistent with Grade II diastolic dysfunction  (pseudonormalization).  2. Right ventricular systolic function is normal. The right ventricular  size is normal. Tricuspid  regurgitation signal is inadequate for assessing  PA pressure.  3. Left atrial size was mild to moderately dilated.  4. The mitral valve is normal in structure. No evidence of mitral valve  regurgitation.  5. The aortic valve is normal in structure. Aortic valve regurgitation is  not visualized.   Cardiac cath 02/26/17:  Ost RCA to Prox RCA lesion, 40 %stenosed.  Prox RCA lesion, 60 %stenosed.  Mid RCA lesion, 100 %stenosed.  1st Mrg lesion, 30 %stenosed.  Mid Cx lesion, 75 %stenosed.  Ost LAD to Prox LAD lesion, 20 %stenosed.  1st Diag lesion, 60 %stenosed.   1. Triple vessel CAD 2. Patent stent proximal LAD with minimal in stent restenosis 3. Severe stenosis distal Circumflex artery 4. Successful PTCA/DES x 1 distal Circumflex 5. Patent proximal RCA stent followed by chronic total occlusion of RCA in the mid segment. The distal RCA fills from left to right collaterals.   Diagnostic Diagram      PCI of the distal Circumflex was peformed. Xience Sierra 2.75 x 12 mm stent placed.   EKG:  EKG is *** ordered today. The ekg ordered today demonstrates    Recent Labs: No results found for requested labs within last 8760 hours.   Lipid Panel Followed in primary care Lipid Panel     Component Value Date/Time   CHOL 135 03/06/2017 0525   TRIG 86 03/06/2017 0525   HDL 41 03/06/2017 0525   CHOLHDL 3.3 03/06/2017 0525   VLDL 17 03/06/2017 0525   LDLCALC 77 03/06/2017 0525   LDLDIRECT 59.9 06/03/2007 1114     Wt Readings from Last 3 Encounters:  01/20/20 196 lb (88.9 kg)  12/24/19 196 lb (88.9 kg)  06/02/19 198 lb (89.8 kg)     Other studies Reviewed: Additional studies/ records that were reviewed today include: . Review of the above records demonstrates:   Assessment and Plan:   1. CAD with chronic stable angina:  No chest pain. Continue ASA, Imdur and statin. He does not tolerate beta blockers due to bradycardia.   2. HTN: BP is controlled. No  changes  3. HLD: Lipids followed in primary care. LDL *** Continue statin  4. Ischemic cardiomyopathy: LVEF=35-40% by echo June 2021. He  is tolerating Entresto. He has not tolerated beta blockers due to bradycardia.   Will continue Entresto  5. LV thrombus: No LV thrombus on last echo but given apical akinesis and potential for future thrombus. Continue Eliquis  This did not include the time spent discussing his cardiac disease.   Current medicines are reviewed at length with the patient today.  The patient does not have concerns regarding medicines.  The following changes have been made:  no change  Labs/ tests ordered today include:   No orders of the defined types were placed in this encounter.   Disposition:   FU with me in 6 months.   Signed, Verne Carrow, MD 06/28/2020 6:02 AM    [facility] [address] [place], [place], [place]  [address] Phone: [phone]; Fax: [phone]

## 2020-07-05 ENCOUNTER — Other Ambulatory Visit: Payer: Self-pay | Admitting: Cardiovascular Disease

## 2020-07-08 DIAGNOSIS — I10 Essential (primary) hypertension: Secondary | ICD-10-CM | POA: Diagnosis not present

## 2020-07-08 DIAGNOSIS — M545 Low back pain, unspecified: Secondary | ICD-10-CM | POA: Diagnosis not present

## 2020-07-08 DIAGNOSIS — F329 Major depressive disorder, single episode, unspecified: Secondary | ICD-10-CM | POA: Diagnosis not present

## 2020-07-08 DIAGNOSIS — I2510 Atherosclerotic heart disease of native coronary artery without angina pectoris: Secondary | ICD-10-CM | POA: Diagnosis not present

## 2020-07-08 DIAGNOSIS — E7800 Pure hypercholesterolemia, unspecified: Secondary | ICD-10-CM | POA: Diagnosis not present

## 2020-08-02 ENCOUNTER — Telehealth: Payer: Self-pay | Admitting: Physician Assistant

## 2020-08-02 ENCOUNTER — Encounter: Payer: Self-pay | Admitting: Cardiovascular Disease

## 2020-08-02 NOTE — Telephone Encounter (Signed)
Spoke with Austin Ingram, pt's wife. 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  She is aware I will forward the message to Dr. Clifton James and will call her back.

## 2020-08-02 NOTE — Telephone Encounter (Signed)
New Message:     Pt can not get an appt until May. 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

## 2020-08-02 NOTE — Telephone Encounter (Signed)
Called and let Austin Ingram know letter will be placed at greeter desk for pick up.  She is appreciative for assistance.

## 2020-08-04 DIAGNOSIS — H04123 Dry eye syndrome of bilateral lacrimal glands: Secondary | ICD-10-CM | POA: Diagnosis not present

## 2020-08-04 DIAGNOSIS — H52203 Unspecified astigmatism, bilateral: Secondary | ICD-10-CM | POA: Diagnosis not present

## 2020-08-04 DIAGNOSIS — H2513 Age-related nuclear cataract, bilateral: Secondary | ICD-10-CM | POA: Diagnosis not present

## 2020-08-04 DIAGNOSIS — H5211 Myopia, right eye: Secondary | ICD-10-CM | POA: Diagnosis not present

## 2020-08-10 NOTE — Progress Notes (Addendum)
Cardiology Office Note    Date:  08/12/2020   ID:  Austin Ingram, DOB 1960-05-18, MRN [id number]  PCP:  Alroy Dust, L.Marlou Sa, MD  Cardiologist:  Lauree Chandler, MD  Electrophysiologist:  None   Chief Complaint: f/u CAD, CHF  History of Present Illness:   Austin Ingram is a 60 y.o. male with history of CAD (BMS to LAD 2008, DES to pRCA 2014, DES to dCx 01/2017 with residual disease noted below) with chronic stable angina, ischemic cardiomyopathy, chronic combined CHF, LV thrombus, HTN, HLD, sinus bradycardia (not on BB due to this), CKD stage II by labs (baseline Cr 1.1-1.4), mild OSA (by sleep study 2009, reported to have improved with weight loss) who presents for follow-up.   Coronary history is outlined above. He has had persistently low EF over several years' time. His last cath was in 01/2017 done for CP/DOE which showed chronic occlusion of RCA, patent proximal LAD stent and severe stenosis distal Circumflex artery treated with DES. He was discharged on ASA and Plavix. His Plavix was discontinued after 1 year of therapy in 01/2018. He has not tolerated beta blocker therapy due to bradycardia. He had a cough with ACEi in the past. I met him in 01/2019 at which time he was experiencing class II-III CHF symptoms.  Repeat echo 03/06/19 showed EF 35-40% with apical dyskinesis/akinesis, grade 2 DD, mildly enlarged RV, moderately dilated LAE, could not rule out apical thrombus. F/u limited echo 03/12/19 showed EF 40-45% and did demonstrate presence of LV thrombus therefore he was placed on anticoagulation with Eliquis per Dr. Camillia Herter recommendation. We optimized his medication regimen to include discontinuation of amlodipine/Tekturna and initiation of Entresto. His repeat echo 09/2019 showed EF 35-40%, mild LVH, grade 2 DD, mild-moderate LAE, dyskinetic apex - the previously described apical thrombus is no longer there but there was very slow swirling flow at the apex. Dr. Angelena Form recommended  ongoing anticoagulation for this.  He is seen back for follow-up doing well. He retired from the [employer] office and overall feels much better having done so. 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 He also must have an updated stress test as well. He denies any chest pain, SOB, orthopnea, palpitations near-syncope, syncope or edema. He is back on amlodipine now, thinks perhaps PCP prescribed this again. His BP is elevated today in clinic similar to 2021 but it was 111/71 at his recent DOT physical.    Labwork independently reviewed: [facility] 07/08/20 LDL 78, trig 78, BUN 21, Cr 1.040, TSH wnl, K 3.5 Scanned labs 12/2019 BUN 22, Cr 1.14, K 3.9, LFTs ok, LDL 67 01/2019 TSH wnl, Mg 2.2   Past Medical History:  Diagnosis Date  . Allergic rhinitis   . Anxiety   . Arthritis    "lower back, right thumb, knees" (10/04/2012)  . Asthma    "grew out of it" (10/04/2012)  . CKD (chronic kidney disease), stage II   . Coronary artery disease    a. s/p prior stent to LAD;  b. LHC 6/14: DES to pRCA. c. DES to dCx 01/2017 with residual disease.  . Depression   . Hyperlipidemia   . Hypertension   . Ischemic cardiomyopathy   . LV (left ventricular) mural thrombus   . MI (myocardial infarction) ([place]) 03/2007  . OSA (obstructive sleep apnea)    mild  .  Pneumonia    "as a child" (10/04/2012)  . Sinus bradycardia     Past Surgical History:  Procedure Laterality Date  . ARTHROPLASTY Right 1993   'crushed; removed bone fragments" (10/04/2012)  . CARDIAC CATHETERIZATION  2010  . CORONARY ANGIOPLASTY WITH STENT PLACEMENT  2008; 10/04/2012   "1 + 1" (10/04/2012)  . CORONARY STENT INTERVENTION N/A 02/26/2017   Procedure: CORONARY STENT INTERVENTION;  Surgeon: Burnell Blanks, MD;  Location: [facility];  Service: Cardiovascular;  Laterality: N/A;  . EXPLORATORY LAPAROTOMY   1990's?   "went in to repair hernia; found fatty tissue instead; no hernia repair" (10/04/2012)  . INTRAVASCULAR PRESSURE WIRE/FFR STUDY N/A 02/26/2017   Procedure: INTRAVASCULAR PRESSURE WIRE/FFR STUDY;  Surgeon: Burnell Blanks, MD;  Location: [facility];  Service: Cardiovascular;  Laterality: N/A;  . LEFT HEART CATH AND CORONARY ANGIOGRAPHY N/A 02/26/2017   Procedure: LEFT HEART CATH AND CORONARY ANGIOGRAPHY;  Surgeon: Burnell Blanks, MD;  Location: [facility];  Service: Cardiovascular;  Laterality: N/A;  . LEFT HEART CATHETERIZATION WITH CORONARY ANGIOGRAM N/A 10/04/2012   Procedure: LEFT HEART CATHETERIZATION WITH CORONARY ANGIOGRAM;  Surgeon: Sherren Mocha, MD;  Location: [facility];  Service: Cardiovascular;  Laterality: N/A;    Current Medications: Current Meds  Medication Sig  . acetaminophen (TYLENOL) 325 MG tablet Take 1 tablet by mouth every 6 (six) hours as needed.  Marland Kitchen acetaminophen-codeine (TYLENOL #3) 300-30 MG tablet Take 1 tablet by mouth as needed for moderate pain.   Marland Kitchen amLODipine (NORVASC) 10 MG tablet Take 10 mg by mouth daily.  Marland Kitchen amoxicillin (AMOXIL) 500 MG capsule TAKE 4 CAPSULES ONE HOUR PRIOR TO [facility] DENTAL APPOINTMENT  . aspirin EC 81 MG tablet Take 81 mg by mouth daily with breakfast.  . atorvastatin (LIPITOR) 80 MG tablet Take 1 tablet (80 mg total) by mouth daily.  . Azelastine HCl 137 MCG/SPRAY SOLN Place 1 spray into both nostrils 2 (two) times daily. As needed  . citalopram (CELEXA) 40 MG tablet Take 0.5 tablets by mouth daily.  Marland Kitchen ELIQUIS 5 MG TABS tablet TAKE 1 TABLET BY MOUTH TWICE A DAY  . ENTRESTO 97-103 MG TAKE 1 TABLET BY MOUTH TWICE A DAY  . finasteride (PROSCAR) 5 MG tablet Take 5 mg by mouth daily.  . isosorbide mononitrate (IMDUR) 30 MG 24 hr tablet TAKE 1 TABLET BY MOUTH EVERY DAY  . meloxicam (MOBIC) 15 MG tablet Take 15 mg by mouth daily as needed for pain.   . nitroGLYCERIN (NITROSTAT) 0.4 MG SL tablet PLACE 1 TABLET  UNDER THE TONGUE EVERY 5 MINUTES AS NEEDED FOR CHEST PAIN. Please schedule yearly appointment for future refills. Thank you  . tamsulosin (FLOMAX) 0.4 MG CAPS capsule Take 0.4 mg by mouth daily with breakfast.   . [DISCONTINUED] atorvastatin (LIPITOR) 40 MG tablet Take 1 tablet (40 mg total) daily by mouth.  . [DISCONTINUED] citalopram (CELEXA) 20 MG tablet Take 20 mg by mouth daily with breakfast.      Allergies:   Adhesive [tape]   Social History   Socioeconomic History  . Marital status: Married    Spouse name: Not on file  . Number of children: 1  . Years of education: Not on file  . Highest education level: Not on file  Occupational History    Employer: [employer]  Tobacco Use  . Smoking status: Former Smoker    Packs/day: 0.50    Years: 20.00    Pack years:  10.00    Types: Cigarettes    Quit date: 12/31/1998    Years since quitting: 21.6  . Smokeless tobacco: Never Used  Vaping Use  . Vaping Use: Never used  Substance and Sexual Activity  . Alcohol use: Yes    Comment: 10/04/2012 "1 beer plus 2 mixed drinks once/month"  . Drug use: Yes    Frequency: 14.0 times per week    Types: Marijuana    Comment: smokes 2 joints/day  . Sexual activity: Yes  Other Topics Concern  . Not on file  Social History Narrative  . Not on file   Social Determinants of Health   Financial Resource Strain: Not on file  Food Insecurity: Not on file  Transportation Needs: Not on file  Physical Activity: Not on file  Stress: Not on file  Social Connections: Not on file     Family History:  The patient's family history includes Atrial fibrillation in his mother; CAD in his maternal grandfather and maternal grandmother; Diabetes type II in his mother; Hypertension in his mother.  ROS:   Please see the history of present illness.  All other systems are reviewed and otherwise negative.    EKGs/Labs/Other Studies Reviewed:    Studies reviewed are outlined and summarized above.  Reports included below if pertinent.  Last echo 09/2019   1. Left ventricular ejection fraction, by estimation, is 35 to 40%. The  left ventricle has moderately decreased function. The left ventricle  demonstrates regional wall motion abnormalities (see scoring  diagram/findings for description). There is mild  concentric left ventricular hypertrophy. Left ventricular diastolic  parameters are consistent with Grade II diastolic dysfunction  (pseudonormalization).  2. Right ventricular systolic function is normal. The right ventricular  size is normal. Tricuspid regurgitation signal is inadequate for assessing  PA pressure.  3. Left atrial size was mild to moderately dilated.  4. The mitral valve is normal in structure. No evidence of mitral valve  regurgitation.  5. The aortic valve is normal in structure. Aortic valve regurgitation is  not visualized.   Comparison(s): Prior images reviewed side by side. The left ventricular  function is unchanged. The left ventricular wall motion unchanged. The  previously described left ventricular apical thrombus is no longer  present, but there is very slow swirling  flow at theapex.     EKG:  EKG is ordered today, personally reviewed, demonstrating SB 53bpm, old anterior/inferior infarct with chronic diffuse TW changes  Recent Labs: No results found for requested labs within last 8760 hours.  Recent Lipid Panel    Component Value Date/Time   CHOL 135 03/06/2017 0525   TRIG 86 03/06/2017 0525   HDL 41 03/06/2017 0525   CHOLHDL 3.3 03/06/2017 0525   VLDL 17 03/06/2017 0525   LDLCALC 77 03/06/2017 0525   LDLDIRECT 59.9 06/03/2007 1114    PHYSICAL EXAM:    VS:  BP (!) 144/92   Pulse (!) 53   Ht '5\' 9"'  (1.753 m)   Wt 208 lb 12.8 oz (94.7 kg)   SpO2 98%   BMI 30.83 kg/m   BMI: Body mass index is 30.83 kg/m.  GEN: Well nourished, well developed male in no acute distress HEENT: normocephalic, atraumatic Neck: no JVD, carotid  bruits, or masses Cardiac: RRR; no murmurs, rubs, or gallops, no edema  Respiratory:  clear to auscultation bilaterally, normal work of breathing GI: soft, nontender, nondistended, + BS MS: no deformity or atrophy Skin: warm and dry, no rash Neuro:  Alert  and Oriented x 3, Strength and sensation are intact, follows commands Psych: euthymic mood, full affect  Wt Readings from Last 3 Encounters:  08/12/20 208 lb 12.8 oz (94.7 kg)  01/20/20 196 lb (88.9 kg)  12/24/19 196 lb (88.9 kg)     ASSESSMENT & PLAN:   1. CAD - doing well from cardiac standpoint. Continue ASA, Imdur, statin. See below regarding statin titration. Not on BB due to baseline sinus bradycardia. He is in need of stress testing for his DOT physical. His EKG is abnormal at baseline therefore ETT would be difficult to interpret. We will arrange a Lexiscan myoview.   Shared Decision Making/Informed Consent The risks [chest pain, shortness of breath, cardiac arrhythmias, dizziness, blood pressure fluctuations, myocardial infarction, stroke/transient ischemic attack, nausea, vomiting, allergic reaction, radiation exposure, metallic taste sensation and life-threatening complications (estimated to be 1 in 10,000)], benefits (risk stratification, diagnosing coronary artery disease, treatment guidance) and alternatives of a nuclear stress test were discussed in detail with Mr. Mainville and he agrees to proceed.  2. Chronic combined CHF - weight is up compared to prior but he is feeling great (NYHA class 1 today) and appears euvolemic. He is not on BB due to baseline bradycardia. Doing well on Entresto and Imdur. At some point he got restarted on amlodipine. Ideally I'd like to see him on a more guideline-directed regimen to include consideration of spironolactone +/- hydralazine, but since he feels great (and this has an anti-anginal effect), I think it is reasonable to continue the amlodipine for now. He was back on this when he saw Dr.  Angelena Form last year as well. He may require additional BP control, however. His BP is up here but was reported to be 111/71 at recent DOT physical. We need a better idea of what it's running at home. The patient was provided instructions on monitoring blood pressure at home for 1 week and relaying results to our office. We'll also recheck his echocardiogram as his EF has to be at least 40% to qualify for CDL.  3. LV thrombus - maintained on chronic Eliquis per Dr. Angelena Form. Check updated CBC. No interim stroke/TIA sx.  4. Essential HTN - Suboptimal blood pressure control noted today. We need a better idea of what it's running at home. The patient was provided instructions on monitoring blood pressure at home for 1 week and relaying results to our office. Will also update his TSH (and a more recent BMET in case we decide to add spironolactone).  5. Hyperlipidemia - LDL suboptimally controlled by recent primary care labs. Goal would be <70. Will titrate atorvastatin to 26m daily with recheck CMET/lipids in 3 months.  Disposition: F/u with me in 1 month.  Medication Adjustments/Labs and Tests Ordered: Current medicines are reviewed at length with the patient today.  Concerns regarding medicines are outlined above. Medication changes, Labs and Tests ordered today are summarized above and listed in the Patient Instructions accessible in Encounters.   Signed, DCharlie Pitter [name]  08/12/2020 10:04 AM    [email or web address] [place]  [id number]: [phone] Fax: [phone]

## 2020-08-12 ENCOUNTER — Other Ambulatory Visit: Payer: Self-pay

## 2020-08-12 ENCOUNTER — Encounter: Payer: Self-pay | Admitting: Physician Assistant

## 2020-08-12 ENCOUNTER — Telehealth (HOSPITAL_COMMUNITY): Payer: Self-pay | Admitting: *Deleted

## 2020-08-12 ENCOUNTER — Ambulatory Visit: Payer: Federal, State, Local not specified - PPO | Admitting: Physician Assistant

## 2020-08-12 VITALS — BP 144/92 | HR 53 | Ht 69.0 in | Wt 208.8 lb

## 2020-08-12 DIAGNOSIS — I513 Intracardiac thrombosis, not elsewhere classified: Secondary | ICD-10-CM

## 2020-08-12 DIAGNOSIS — I10 Essential (primary) hypertension: Secondary | ICD-10-CM | POA: Diagnosis not present

## 2020-08-12 DIAGNOSIS — I5042 Chronic combined systolic (congestive) and diastolic (congestive) heart failure: Secondary | ICD-10-CM | POA: Diagnosis not present

## 2020-08-12 DIAGNOSIS — I2510 Atherosclerotic heart disease of native coronary artery without angina pectoris: Secondary | ICD-10-CM

## 2020-08-12 DIAGNOSIS — E785 Hyperlipidemia, unspecified: Secondary | ICD-10-CM | POA: Diagnosis not present

## 2020-08-12 LAB — BASIC METABOLIC PANEL
BUN/Creatinine Ratio: 14 (ref 9–20)
BUN: 15 mg/dL (ref 6–24)
CO2: 22 mmol/L (ref 20–29)
Calcium: 9.1 mg/dL (ref 8.7–10.2)
Chloride: 104 mmol/L (ref 96–106)
Creatinine, Ser: 1.08 mg/dL (ref 0.76–1.27)
Glucose: 103 mg/dL — ABNORMAL HIGH (ref 65–99)
Potassium: 4.2 mmol/L (ref 3.5–5.2)
Sodium: 140 mmol/L (ref 134–144)
eGFR: 80 mL/min/{1.73_m2} (ref >59)

## 2020-08-12 LAB — CBC
Hematocrit: 43.2 % (ref 37.5–51.0)
Hemoglobin: 13.3 g/dL (ref 13.0–17.7)
MCH: 22.5 pg — ABNORMAL LOW (ref 26.6–33.0)
MCHC: 30.8 g/dL — ABNORMAL LOW (ref 31.5–35.7)
MCV: 73 fL — ABNORMAL LOW (ref 79–97)
Platelets: 269 10*3/uL (ref 150–450)
RBC: 5.9 x10E6/uL — ABNORMAL HIGH (ref 4.14–5.80)
RDW: 15.7 % — ABNORMAL HIGH (ref 11.6–15.4)
WBC: 5 10*3/uL (ref 3.4–10.8)

## 2020-08-12 LAB — TSH: TSH: 1.54 u[IU]/mL (ref 0.450–4.500)

## 2020-08-12 MED ORDER — ATORVASTATIN CALCIUM 80 MG PO TABS: 80.0000 mg | ORAL_TABLET | Freq: Every day | ORAL | 3 refills | Status: DC

## 2020-08-12 NOTE — Telephone Encounter (Signed)
Left message on voicemail per DPR in reference to upcoming appointment scheduled on 08/16/20 at 11:00 with detailed instructions given per Myocardial Perfusion Study Information Sheet for the test. LM to arrive 15 minutes early, and that it is imperative to arrive on time for appointment to keep from having the test rescheduled. If you need to cancel or reschedule your appointment, please call the office within 24 hours of your appointment. Failure to do so may result in a cancellation of your appointment, and a $50 no show fee. Phone number given for call back for any questions.

## 2020-08-12 NOTE — Patient Instructions (Addendum)
Medication Instructions:  Your physician has recommended you make the following change in your medication:  1.  INCREASE the Atorvastatin to 80 mg taking 1 daily  *If you need a refill on your cardiac medications before your next appointment, please call your pharmacy*   Lab Work: TODAY:  BMET, CBC, & TSH  3 MONTHS:  FASTING LIPID & CMET  If you have labs (blood work) drawn today and your tests are completely normal, you will receive your results only by: Marland Kitchen MyChart Message (if you have MyChart) OR . A paper copy in the mail If you have any lab test that is abnormal or we need to change your treatment, we will call you to review the results.   Testing/Procedures: Your physician has requested that you have an echocardiogram. Echocardiography is a painless test that uses sound waves to create images of your heart. It provides your doctor with information about the size and shape of your heart and how well your heart's chambers and valves are working. This procedure takes approximately one hour. There are no restrictions for this procedure.    Your physician has requested that you have a lexiscan myoview. For further information please visit [email or web address] Please follow instruction sheet, BELOW:   You are scheduled for a Myocardial Perfusion Imaging Study Please arrive 15 minutes prior to your appointment time for registration and insurance purposes.  The test will take approximately 3 to 4 hours to complete; you may bring reading material.  If someone comes with you to your appointment, they will need to remain in the main lobby due to limited space in the testing area. **If you are pregnant or breastfeeding, please notify the nuclear lab prior to your appointment**  How to prepare for your Myocardial Perfusion Test: . Do not eat or drink 3 hours prior to your test, except you may have water. . Do not consume products containing caffeine (regular or decaffeinated) 12 hours prior  to your test. (ex: coffee, chocolate, sodas, tea). . Do bring a list of your current medications with you.  If not listed below, you may take your medications as normal. . Do wear comfortable clothes (no dresses or overalls) and walking shoes, tennis shoes preferred (No heels or open toe shoes are allowed). . Do NOT wear cologne, perfume, aftershave, or lotions (deodorant is allowed). . If these instructions are not followed, your test will have to be rescheduled.       Follow-Up: At [facility], you and your health needs are our priority.  As part of our continuing mission to provide you with exceptional heart care, we have created designated Provider Care Teams.  These Care Teams include your primary Cardiologist (physician) and Advanced Practice Providers (APPs -  Physician Assistants and Nurse Practitioners) who all work together to provide you with the care you need, when you need it.  We recommend signing up for the patient portal called "MyChart".  Sign up information is provided on this After Visit Summary.  MyChart is used to connect with patients for Virtual Visits (Telemedicine).  Patients are able to view lab/test results, encounter notes, upcoming appointments, etc.  Non-urgent messages can be sent to your provider as well.   To learn more about what you can do with MyChart, go to [email or web address].    Your next appointment:   AFTER TESTING   The format for your next appointment:   In Person  Provider:    You may see Ronie Spies, [name]  Other Instructions I would recommend using a blood pressure cuff that goes on your arm. The wrist ones can be inaccurate. If possible, try to select one that also reports your heart rate. To check your blood pressure, choose a time at least 3 hours after taking your blood pressure medicines. If you can sample it at different times of the day, that's great - it might give you more information about how your blood pressure fluctuates.  Remain seated in a chair for 5 minutes quietly beforehand, then check it. Please record a list of those readings and call us/send in MyChart message with them for our review in 1 week.     Echocardiogram An echocardiogram is a test that uses sound waves (ultrasound) to produce images of the heart. Images from an echocardiogram can provide important information about:  Heart size and shape.  The size and thickness and movement of your heart's walls.  Heart muscle function and strength.  Heart valve function or if you have stenosis. Stenosis is when the heart valves are too narrow.  If blood is flowing backward through the heart valves (regurgitation).  A tumor or infectious growth around the heart valves.  Areas of heart muscle that are not working well because of poor blood flow or injury from a heart attack.  Aneurysm detection. An aneurysm is a weak or damaged part of an artery wall. The wall bulges out from the normal force of blood pumping through the body. Tell a health care provider about:  Any allergies you have.  All medicines you are taking, including vitamins, herbs, eye drops, creams, and over-the-counter medicines.  Any blood disorders you have.  Any surgeries you have had.  Any medical conditions you have.  Whether you are pregnant or may be pregnant. What are the risks? Generally, this is a safe test. However, problems may occur, including an allergic reaction to dye (contrast) that may be used during the test. What happens before the test? No specific preparation is needed. You may eat and drink normally. What happens during the test?  You will take off your clothes from the waist up and put on a hospital gown.  Electrodes or electrocardiogram (ECG)patches may be placed on your chest. The electrodes or patches are then connected to a device that monitors your heart rate and rhythm.  You will lie down on a table for an ultrasound exam. A gel will be  applied to your chest to help sound waves pass through your skin.  A handheld device, called a transducer, will be pressed against your chest and moved over your heart. The transducer produces sound waves that travel to your heart and bounce back (or "echo" back) to the transducer. These sound waves will be captured in real-time and changed into images of your heart that can be viewed on a video monitor. The images will be recorded on a computer and reviewed by your health care provider.  You may be asked to change positions or hold your breath for a short time. This makes it easier to get different views or better views of your heart.  In some cases, you may receive contrast through an IV in one of your veins. This can improve the quality of the pictures from your heart. The procedure may vary among health care providers and hospitals.   What can I expect after the test? You may return to your normal, everyday life, including diet, activities, and medicines, unless your health care provider tells you  not to do that. Follow these instructions at home:  It is up to you to get the results of your test. Ask your health care provider, or the department that is doing the test, when your results will be ready.  Keep all follow-up visits. This is important. Summary  An echocardiogram is a test that uses sound waves (ultrasound) to produce images of the heart.  Images from an echocardiogram can provide important information about the size and shape of your heart, heart muscle function, heart valve function, and other possible heart problems.  You do not need to do anything to prepare before this test. You may eat and drink normally.  After the echocardiogram is completed, you may return to your normal, everyday life, unless your health care provider tells you not to do that. This information is not intended to replace advice given to you by your health care provider. Make sure you discuss any  questions you have with your health care provider. Document Revised: 12/09/2019 Document Reviewed: 12/09/2019 Elsevier Patient Education  2021 [employer].

## 2020-08-16 ENCOUNTER — Ambulatory Visit (HOSPITAL_COMMUNITY): Payer: Federal, State, Local not specified - PPO | Attending: Cardiology

## 2020-08-16 ENCOUNTER — Other Ambulatory Visit: Payer: Self-pay

## 2020-08-16 ENCOUNTER — Ambulatory Visit (HOSPITAL_BASED_OUTPATIENT_CLINIC_OR_DEPARTMENT_OTHER): Payer: Federal, State, Local not specified - PPO

## 2020-08-16 DIAGNOSIS — I5042 Chronic combined systolic (congestive) and diastolic (congestive) heart failure: Secondary | ICD-10-CM | POA: Diagnosis not present

## 2020-08-16 DIAGNOSIS — I513 Intracardiac thrombosis, not elsewhere classified: Secondary | ICD-10-CM | POA: Diagnosis not present

## 2020-08-16 DIAGNOSIS — I10 Essential (primary) hypertension: Secondary | ICD-10-CM | POA: Diagnosis not present

## 2020-08-16 DIAGNOSIS — I2510 Atherosclerotic heart disease of native coronary artery without angina pectoris: Secondary | ICD-10-CM

## 2020-08-16 DIAGNOSIS — E785 Hyperlipidemia, unspecified: Secondary | ICD-10-CM

## 2020-08-16 LAB — MYOCARDIAL PERFUSION IMAGING
LV dias vol: 213 mL (ref 62–150)
LV sys vol: 146 mL
Peak HR: 74 {beats}/min
Rest HR: 54 {beats}/min
SDS: 4
SRS: 18
SSS: 23
TID: 1.01

## 2020-08-16 LAB — ECHOCARDIOGRAM COMPLETE
Area-P 1/2: 5.23 cm2
Height: 69 in
S' Lateral: 4.4 cm
Weight: 3328 oz

## 2020-08-16 MED ORDER — PERFLUTREN LIPID MICROSPHERE
1.0000 mL | INTRAVENOUS | Status: AC | PRN
  Administered 2020-08-16: 1 mL via INTRAVENOUS

## 2020-08-16 MED ORDER — TECHNETIUM TC 99M TETROFOSMIN IV KIT
30.9000 | PACK | Freq: Once | INTRAVENOUS | Status: AC | PRN
  Filled 2020-08-16: qty 31

## 2020-08-16 MED ORDER — TECHNETIUM TC 99M TETROFOSMIN IV KIT
10.5000 | PACK | Freq: Once | INTRAVENOUS | Status: AC | PRN
  Administered 2020-08-16: 10.5 via INTRAVENOUS
  Filled 2020-08-16: qty 11

## 2020-08-16 MED ORDER — REGADENOSON 0.4 MG/5ML IV SOLN: 0.4000 mg | Freq: Once | INTRAVENOUS | Status: AC

## 2020-08-18 ENCOUNTER — Telehealth: Payer: Self-pay | Admitting: *Deleted

## 2020-08-18 DIAGNOSIS — H5211 Myopia, right eye: Secondary | ICD-10-CM | POA: Diagnosis not present

## 2020-08-18 DIAGNOSIS — H2513 Age-related nuclear cataract, bilateral: Secondary | ICD-10-CM | POA: Diagnosis not present

## 2020-08-18 DIAGNOSIS — H52203 Unspecified astigmatism, bilateral: Secondary | ICD-10-CM | POA: Diagnosis not present

## 2020-08-18 DIAGNOSIS — H524 Presbyopia: Secondary | ICD-10-CM | POA: Diagnosis not present

## 2020-08-18 NOTE — Telephone Encounter (Signed)
Echo results reviewed with patient and family member. He voices understanding. Will keep track of bps and drop off some readings or call office with them. Aware this is so that spironolactone can be initiated for heart function. They would like to know can the heart function improve.  He has his f/u with Dayna on 5/5, but I told him I would call him back in the meantime if she has any further information on this.    Also reviewed myoview and lab work.  All results forwarded to PCP.

## 2020-08-19 NOTE — Telephone Encounter (Signed)
Can someone reach out to Mr. Overby to get his most recent BP readings? (See 4/18 result note. I had responded to Michalene's question from the patient about whether his heart function could get better but I'm not sure if the patient had a call back then. I am still waiting on patient to relay BP readings and input from Dr. Clifton James.) Doesn't have to be a comprehensive long list of blood pressures, just looking for at least a few readings so we can go ahead and get spironolactone started based on the general range. He was hypertensive in the office in the 140s but his previous DOT physical he was 111 systolic. Please let me know what it's running. Thanks!

## 2020-08-19 NOTE — Telephone Encounter (Addendum)
Update acknowledged. Will await feedback from patient regarding BP.   FYI to self/covering APP when patient responds: If still hypertensive, anticipate starting spironolactone 25mg  daily with BMET 5-7 days thereafter with close follow-up as scheduled in May. At that time we'll discuss transitioning his amlodipine to hydralazine for GMDT. The discussion surrounding his recent results can be found under echo result note. I had also reached out to Dr. June about whether he needs referral for ICD given EF 30-35% - per Dr. Clifton James reply, "Austin Ingram, Let's repeat the echo in 3 months and then we will know that is probably where his LVEF is going to be permanently. Thanks for seeing him." I will plan to discuss with patient and arrange when I see him in follow-up.

## 2020-08-19 NOTE — Telephone Encounter (Signed)
Called and spoke with pt.  He is not at home and unable to provide any bp readings at all.  He will call back when he gets home and leave a message with his readings.  I relayed the message from Dayna (see echo results) re: whether or not his EF could improve.  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

## 2020-08-31 NOTE — Progress Notes (Signed)
Cardiology Office Note    Date:  09/02/2020   ID:  Austin Ingram, DOB Oct 22, 1960, MRN [id number]  PCP:  Austin Ingram  Cardiologist:  Austin Ingram  Electrophysiologist:  None   Chief Complaint: f/u testing, BP  History of Present Illness:   Austin Ingram is a 60 y.o. male with history of CAD (BMS to LAD 2008, DES to pRCA 2014, DES to dCx 01/2017 with residual disease noted below) with chronic stable angina, ischemic cardiomyopathy, chronic combined CHF, LV thrombus, HTN, HLD, sinus bradycardia (not on BB due to this), CKD stage II by labs (baseline Cr 1.1-1.4), mild OSA (by sleep study 2009, reported to have improved with weight loss) who presents for follow-up.   Coronary history is outlined above. He has had persistently low EF over several years' time. His last cath was in 01/2017 done for CP/DOE which showed chronic occlusion of RCA, patent proximal LAD stent and severe stenosis distal Circumflex artery treated with DES. He was discharged on ASA and Plavix. His Plavix was discontinued after 1 year of therapy in 01/2018. He has not tolerated beta blocker therapy due to bradycardia. He had a cough with ACEi in the past. I met him in 01/2019 at which time he was experiencing class II-III CHF symptoms. Repeatecho 03/06/19 showed EF 35-40% with apical dyskinesis/akinesis, grade 2 DD, mildly enlarged RV, moderately dilated LAE, could not rule out apical thrombus. F/u limited echo 11/11/20showed EF 40-45% and diddemonstrate presence of LV thrombus therefore he was placed on anticoagulation with Eliquis per Austin Ingram recommendation. I optimized his medication regimen to include discontinuation of amlodipine/Tekturna and initiation of Entresto with plans to continue to titrate as able. His repeat echo 09/2019 showed EF 35-40%, mild LVH, grade 2 DD, mild-moderate LAE, dyskinetic apex - the previously described apical thrombus was no longer there but there was very slow swirling flow  at the apex. Austin Ingram recommended ongoing anticoagulation for this. He saw Austin Ingram in follow-up last year and was felt to be doing well. At some point the patient reports his PCP had restarted amlodipine last year.   I saw him again in follow-up recently and he was clinically stable. 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 [employer] guidelines also require stress testing every 2 years and he had not had that recently. We arranged an echo and nuc. Unfortunately but not surprisingly his EF remained very low at 30-35% with multiple WMA, grade 1 DD. His nuc was abnormal due to LV dysfunction but no ischemia seen. At last OV his BP was elevated and we were awaiting his response on blood pressure readings to be able to start spironolactone to intensify his guideline directed medical therapy but had not heard back yet. Atorvastatin was also titrated.  He is seen back for follow-up today overall doing well. 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 He denies any chest pain. In pressing him further, he still has some degree of class II-III dyspnea with higher levels of activity. He reports he used to be able to mow the grass in half the amount of time he can now, having to stop and take breaks. This has not progressed recently. No palpitations, syncope, edema. His wife sent a picture of most recent home BP readings and they range from [id number] systolic. He is slightly hypertensive here  in the office again as in prior visits.    Labwork independently reviewed: 07/2020 Hgb 13.3, MCV 73 (advised to f/u PCP), TSH wnl, K 4.2, Cr 1.08 KPN 07/08/20 LDL 78, trig 78, BUN 21, Cr 1.040, TSH wnl, K 3.5 Scanned labs 12/2019 BUN 22, Cr 1.14, K 3.9, LFTs ok, LDL 67 01/2019 TSH wnl, Mg 2.2   Past Medical History:  Diagnosis Date  . Allergic  rhinitis   . Anxiety   . Arthritis    "lower back, right thumb, knees" (10/04/2012)  . Asthma    "grew out of it" (10/04/2012)  . CKD (chronic kidney disease), stage II   . Coronary artery disease    a. s/p prior stent to LAD;  b. LHC 6/14: DES to pRCA. c. DES to dCx 01/2017 with residual disease.  . Depression   . Hyperlipidemia   . Hypertension   . Ischemic cardiomyopathy   . LV (left ventricular) mural thrombus   . MI (myocardial infarction) ([place]) 03/2007  . OSA (obstructive sleep apnea)    mild  . Pneumonia    "as a child" (10/04/2012)  . Sinus bradycardia     Past Surgical History:  Procedure Laterality Date  . ARTHROPLASTY Right 1993   'crushed; removed bone fragments" (10/04/2012)  . CARDIAC CATHETERIZATION  2010  . CORONARY ANGIOPLASTY WITH STENT PLACEMENT  2008; 10/04/2012   "1 + 1" (10/04/2012)  . CORONARY STENT INTERVENTION N/A 02/26/2017   Procedure: CORONARY STENT INTERVENTION;  Surgeon: Austin Blanks, Ingram;  Location: [facility];  Service: Cardiovascular;  Laterality: N/A;  . EXPLORATORY LAPAROTOMY  1990's?   "went in to repair hernia; found fatty tissue instead; no hernia repair" (10/04/2012)  . INTRAVASCULAR PRESSURE WIRE/FFR STUDY N/A 02/26/2017   Procedure: INTRAVASCULAR PRESSURE WIRE/FFR STUDY;  Surgeon: Austin Blanks, Ingram;  Location: [facility];  Service: Cardiovascular;  Laterality: N/A;  . LEFT HEART CATH AND CORONARY ANGIOGRAPHY N/A 02/26/2017   Procedure: LEFT HEART CATH AND CORONARY ANGIOGRAPHY;  Surgeon: Austin Blanks, Ingram;  Location: [facility];  Service: Cardiovascular;  Laterality: N/A;  . LEFT HEART CATHETERIZATION WITH CORONARY ANGIOGRAM N/A 10/04/2012   Procedure: LEFT HEART CATHETERIZATION WITH CORONARY ANGIOGRAM;  Surgeon: Austin Mocha, Ingram;  Location: [facility];  Service: Cardiovascular;  Laterality: N/A;    Current Medications: Current Meds  Medication Sig  . acetaminophen (TYLENOL) 325 MG tablet Take  1 tablet by mouth every 6 (six) hours as needed.  Marland Kitchen acetaminophen-codeine (TYLENOL #3) 300-30 MG tablet Take 1 tablet by mouth as needed for moderate pain.   Marland Kitchen amoxicillin (AMOXIL) 500 MG capsule TAKE 4 CAPSULES ONE HOUR PRIOR TO [facility] DENTAL APPOINTMENT  . aspirin EC 81 MG tablet Take 81 mg by mouth daily with breakfast.  . atorvastatin (LIPITOR) 80 MG tablet Take 1 tablet (80 mg total) by mouth daily.  . Azelastine HCl 137 MCG/SPRAY SOLN Place 1 spray into both nostrils 2 (two) times daily. As needed  . citalopram (CELEXA) 40 MG tablet Take 0.5 tablets by mouth daily.  Marland Kitchen ELIQUIS 5 MG TABS tablet TAKE 1 TABLET BY MOUTH TWICE A DAY  . ENTRESTO 97-103 MG TAKE 1 TABLET BY MOUTH TWICE A DAY  . finasteride (PROSCAR) 5 MG tablet Take 5 mg by mouth daily.  . isosorbide mononitrate (IMDUR) 30 MG 24 hr tablet TAKE 1 TABLET BY MOUTH EVERY DAY  . meloxicam (MOBIC) 15 MG tablet Take 15 mg by mouth daily as needed for  pain.   . nitroGLYCERIN (NITROSTAT) 0.4 MG SL tablet PLACE 1 TABLET UNDER THE TONGUE EVERY 5 MINUTES AS NEEDED FOR CHEST PAIN. Please schedule yearly appointment for future refills. Thank you  . spironolactone (ALDACTONE) 25 MG tablet Take 1 tablet (25 mg total) by mouth daily.  . tamsulosin (FLOMAX) 0.4 MG CAPS capsule Take 0.4 mg by mouth daily with breakfast.   .  (NORVASC) 10 MG tablet Take 10 mg by mouth daily.     Allergies:   Adhesive [tape]   Social History   Socioeconomic History  . Marital status: Married    Spouse name: Not on file  . Number of children: 1  . Years of education: Not on file  . Highest education level: Not on file  Occupational History    Employer: [employer]  Tobacco Use  . Smoking status: Former Smoker    Packs/day: 0.50    Years: 20.00    Pack years: 10.00    Types: Cigarettes    Quit date: 12/31/1998    Years since quitting: 21.6  . Smokeless tobacco: Never Used  Vaping Use  . Vaping Use: Never used  Substance and Sexual Activity  . Alcohol  use: Yes    Comment: 10/04/2012 "1 beer plus 2 mixed drinks once/month"  . Drug use: Yes    Frequency: 14.0 times per week    Types: Marijuana    Comment: smokes 2 joints/day  . Sexual activity: Yes  Other Topics Concern  . Not on file  Social History Narrative  . Not on file   Social Determinants of Health   Financial Resource Strain: Not on file  Food Insecurity: Not on file  Transportation Needs: Not on file  Physical Activity: Not on file  Stress: Not on file  Social Connections: Not on file     Family History:  The patient's family history includes Atrial fibrillation in his mother; CAD in his maternal grandfather and maternal grandmother; Diabetes type II in his mother; Hypertension in his mother.  ROS:   Please see the history of present illness.  All other systems are reviewed and otherwise negative.    EKGs/Labs/Other Studies Reviewed:    Studies reviewed are outlined and summarized above. Reports included below if pertinent.  2D echo 07/2020  1. Left ventricular ejection fraction, by estimation, is 30 to 35%. The  left ventricle has moderately decreased function. The left ventricle  demonstrates regional wall motion abnormalities with mid to apical  anteroseptal and inferoseptal akinesis,  akinesis of the apical anterior wall, the apical inferior wall, the apical  lateral wall, and the true apex. Left ventricular diastolic parameters are  consistent with Grade I diastolic dysfunction (impaired relaxation).  2. Right ventricular systolic function is normal. The right ventricular  size is normal. Tricuspid regurgitation signal is inadequate for assessing  PA pressure.  3. The mitral valve is normal in structure. No evidence of mitral valve  regurgitation.  4. The aortic valve is tricuspid. Aortic valve regurgitation is not  visualized. No aortic stenosis is present.  5. The inferior vena cava is normal in size with greater than 50%  respiratory  variability, suggesting right atrial pressure of 3 mmHg.   Nuc stress test 07/2020   The left ventricular ejection fraction is moderately decreased (30-44%).  Nuclear stress EF: 31%.  There was no ST segment deviation noted during stress.  Defect 1: There is a large defect of severe severity present in the mid anteroseptal, apical anterior,  apical septal, apical inferior, apical lateral and apex location.  Findings consistent with prior myocardial infarction.  This is a high risk study.   Abnormal, high risk stress nuclear study with prior distal anteroseptal and apical infarct but no ischemia.  Gated ejection fraction 31% with global hypokinesis and akinesis of the apex.  Severe left ventricular enlargement.  Study interpreted as high risk due to reduced LV function.     EKG:  EKG is not ordered today (see last OV)  Recent Labs: 08/12/2020: BUN 15; Creatinine, Ser 1.08; Hemoglobin 13.3; Platelets 269; Potassium 4.2; Sodium 140; TSH 1.540  Recent Lipid Panel    Component Value Date/Time   CHOL 135 03/06/2017 0525   TRIG 86 03/06/2017 0525   HDL 41 03/06/2017 0525   CHOLHDL 3.3 03/06/2017 0525   VLDL 17 03/06/2017 0525   LDLCALC 77 03/06/2017 0525   LDLDIRECT 59.9 06/03/2007 1114    PHYSICAL EXAM:    VS:  BP (!) 142/86   Pulse (!) 55   Ht '5\' 9"'  (1.753 m)   Wt 209 lb 9.6 oz (95.1 kg)   SpO2 97%   BMI 30.95 kg/m   BMI: Body mass index is 30.95 kg/m.  GEN: Well nourished, well developed male in no acute distress HEENT: normocephalic, atraumatic Neck: no JVD, carotid bruits, or masses Cardiac: RRR; no murmurs, rubs, or gallops, no edema  Respiratory:  clear to auscultation bilaterally, normal work of breathing GI: soft, nontender, nondistended, + BS MS: no deformity or atrophy Skin: warm and dry, no rash Neuro:  Alert and Oriented x 3, Strength and sensation are intact, follows commands Psych: euthymic mood, full affect  Wt Readings from Last 3 Encounters:   09/02/20 209 lb 9.6 oz (95.1 kg)  08/16/20 208 lb (94.3 kg)  08/12/20 208 lb 12.8 oz (94.7 kg)     ASSESSMENT & PLAN:   1. 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 Continue ASA, recently titrated statin. Not on BB due to baseline bradycardia. We have follow-up CMET and lipid profile scheduled for July.  2. Chronic combined CHF - continues to have class II-III dyspnea with higher levels of activity. I would recommend to continue to optimize his regimen to guideline directed therapy. He is already on max dose Entresto. Not on BB due to baseline HR 50s. At one point his amlodipine was resumed by an outside office. I will start spironolactone 62m daily today. Since BPs are controlled at home, will have him decrease his amlodipine to 1/2 tablet daily to accomodate this change - will hold off rx'ing new dose as I would hope we can get him off of this and potentially on a titrated dose of spironolactone vs addition of hydralazine instead. Will recheck BMET in 1 week after initiation at which time I told him to have his BP readings ready so that we can see what it's running. 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 We discussed referral to the Advanced Heart Failure service - although he has not struggled with volume overload issues, I would really appreciate their input on his care/trajectory/future plans given his persistent severe LV dysfunction and chronic dyspnea. Per discussion with Dr. MAngelena Ingram anticipating repeating echo in 3 months since we are adjusting his HF regimen to evaluate for candidacy for ICD. I discussed this plan with him today.  We can arrange this closer to that timeframe once we see how he tolerates progression of GDMT.   3. LV thrombus history - maintained on [place] in the Ingram of Eliquis at Austin Ingram suggestion given  continued apical WMA. No bleeding reported.  4. Essential HTN - follow BP with med changes as above.  Disposition: F/u with myself or pharmD for med titration OV in 2-3 weeks, OK to be virtual. Will refer to AHF clinic as above. Patient agreeable to plan.  Medication Adjustments/Labs and Tests Ordered: Current medicines are reviewed at length with the patient today.  Concerns regarding medicines are outlined above. Medication changes, Labs and Tests ordered today are summarized above and listed in the Patient Instructions accessible in Encounters.   Signed, Charlie Pitter, [name]  09/02/2020 9:11 AM    [facility] [address], [place], [place]  [address] Phone: [phone]; Fax: [phone]

## 2020-09-02 ENCOUNTER — Other Ambulatory Visit: Payer: Self-pay

## 2020-09-02 ENCOUNTER — Ambulatory Visit: Payer: Federal, State, Local not specified - PPO | Admitting: Physician Assistant

## 2020-09-02 ENCOUNTER — Encounter: Payer: Self-pay | Admitting: Physician Assistant

## 2020-09-02 VITALS — BP 142/86 | HR 55 | Ht 69.0 in | Wt 209.6 lb

## 2020-09-02 DIAGNOSIS — I10 Essential (primary) hypertension: Secondary | ICD-10-CM | POA: Diagnosis not present

## 2020-09-02 DIAGNOSIS — I5042 Chronic combined systolic (congestive) and diastolic (congestive) heart failure: Secondary | ICD-10-CM | POA: Diagnosis not present

## 2020-09-02 DIAGNOSIS — I513 Intracardiac thrombosis, not elsewhere classified: Secondary | ICD-10-CM

## 2020-09-02 DIAGNOSIS — I2510 Atherosclerotic heart disease of native coronary artery without angina pectoris: Secondary | ICD-10-CM

## 2020-09-02 DIAGNOSIS — E785 Hyperlipidemia, unspecified: Secondary | ICD-10-CM

## 2020-09-02 MED ORDER — SPIRONOLACTONE 25 MG PO TABS: 25.0000 mg | ORAL_TABLET | Freq: Every day | ORAL | 3 refills | Status: DC

## 2020-09-02 MED ORDER — AMLODIPINE BESYLATE 10 MG PO TABS: 5.0000 mg | ORAL_TABLET | Freq: Every day | ORAL | 0 refills | Status: DC

## 2020-09-02 NOTE — Patient Instructions (Addendum)
Medication Instructions:  Your physician has recommended you make the following change in your medication:  1.  REDUCE the Amlodipine to 1/2 tablet daily 2.  START Spironolactone 25 mg taking 1 tablet daily   *If you need a refill on your cardiac medications before your next appointment, please call your pharmacy*   Lab Work: 1 WEEK:  BMET   If you have labs (blood work) drawn today and your tests are completely normal, you will receive your results only by: Marland Kitchen MyChart Message (if you have MyChart) OR . A paper copy in the mail If you have any lab test that is abnormal or we need to change your treatment, we will call you to review the results.   Testing/Procedures: None ordered   You have been referred to Cong[facility] Failure [facility].  They will reach out to you with an appointment.   Follow-Up: At [facility], you and your health needs are our priority.  As part of our continuing mission to provide you with exceptional heart care, we have created designated Provider Care Teams.  These Care Teams include your primary Cardiologist (physician) and Advanced Practice Providers (APPs -  Physician Assistants and Nurse Practitioners) who all work together to provide you with the care you need, when you need it.  We recommend signing up for the patient portal called "MyChart".  Sign up information is provided on this After Visit Summary.  MyChart is used to connect with patients for Virtual Visits (Telemedicine).  Patients are able to view lab/test results, encounter notes, upcoming appointments, etc.  Non-urgent messages can be sent to your provider as well.   To learn more about what you can do with MyChart, go to [email or web address].    Your next appointment:   2-3 weeks   The format for your next appointment:   In Person  Provider:   You may see Verne Carrow, MD or one of the following Advanced Practice Providers on your designated Care Team:    Ronie Spies,  [place]  Pharm D    Other Instructions Please bring your blood pressure cuff with you to the next appointment

## 2020-09-07 ENCOUNTER — Ambulatory Visit: Payer: Federal, State, Local not specified - PPO | Admitting: Physician Assistant

## 2020-09-09 ENCOUNTER — Other Ambulatory Visit: Payer: Federal, State, Local not specified - PPO

## 2020-09-23 ENCOUNTER — Ambulatory Visit: Payer: Federal, State, Local not specified - PPO

## 2020-09-23 ENCOUNTER — Telehealth: Payer: Self-pay

## 2020-09-23 NOTE — Telephone Encounter (Signed)
LMOM TO R/S MISSED APPT  

## 2020-09-23 NOTE — Progress Notes (Deleted)
Patient ID: Austin Ingram                 DOB: 1960-05-11                      MRN: [id number]     HPI: Austin Ingram is a 60 y.o. male patient of Dr. Clifton James, referred by Ronie Spies, PA to PharmD clinic. PMH is significant for  CAD (BMS to LAD 2008, DES to pRCA 2014, DES to dCx 01/2017 with residual disease noted below) with chronic stable angina, ischemic cardiomyopathy, chronic combined CHF, LV thrombus, HTN, HLD, sinus bradycardia (not on BB due to this), CKD stage II by labs (baseline Cr 1.1-1.4),mildOSA(by sleep study 2009, reported to have improved with weight loss). His latest Echo showed persistently low EF of 30-35%. 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  At visit with [facility] patient reported class II-III dyspnea with higher levels of activity (mowing lawn)   SGLT2- fedbcbs Home BP Needs BMP   Current HTN/CHF meds: spironolactone 25mg  daily, Entresto 97/103mg  twice a day, isosorbide mononitrate 30mg  daily, amlodipine 5mg  daily Previously tried: cough with ACE, amlodipine/tekturna (changed to [place]), BB due to bradycardia BP goal:   Family History: The patient's family history includes Atrial fibrillation in his mother; CAD in his maternal grandfather and maternal grandmother; Diabetes type II in his mother; Hypertension in his mother.  Social History:   Diet:   Exercise:   Home BP readings:   Wt Readings from Last 3 Encounters:  09/02/20 209 lb 9.6 oz (95.1 kg)  08/16/20 208 lb (94.3 kg)  08/12/20 208 lb 12.8 oz (94.7 kg)   BP Readings from Last 3 Encounters:  09/02/20 (!) 142/86  08/12/20 (!) 144/92  12/24/19 (!) 140/96   Pulse Readings from Last 3 Encounters:  09/02/20 (!) 55  08/12/20 (!) 53  12/24/19 (!) 53    Renal function: CrCl cannot be calculated (Patient's most recent lab result is older than the maximum 21 days allowed.).  Past Medical History:  Diagnosis Date  . Allergic rhinitis   . Anxiety    . Arthritis    "lower back, right thumb, knees" (10/04/2012)  . Asthma    "grew out of it" (10/04/2012)  . CKD (chronic kidney disease), stage II   . Coronary artery disease    a. s/p prior stent to LAD;  b. LHC 6/14: DES to pRCA. c. DES to dCx 01/2017 with residual disease.  . Depression   . Hyperlipidemia   . Hypertension   . Ischemic cardiomyopathy   . LV (left ventricular) mural thrombus   . MI (myocardial infarction) ([facility]) 03/2007  . OSA (obstructive sleep apnea)    mild  . Pneumonia    "as a child" (10/04/2012)  . Sinus bradycardia     Current Outpatient Medications on File Prior to Visit  Medication Sig Dispense Refill  . acetaminophen (TYLENOL) 325 MG tablet Take 1 tablet by mouth every 6 (six) hours as needed.    03/2017 acetaminophen-codeine (TYLENOL #3) 300-30 MG tablet Take 1 tablet by mouth as needed for moderate pain.     04/2007 amLODipine (NORVASC) 10 MG tablet Take 0.5 tablets (5 mg total) by mouth daily. 45 tablet 0  . amoxicillin (AMOXIL) 500 MG capsule TAKE 4 CAPSULES ONE HOUR PRIOR TO [facility] DENTAL APPOINTMENT    . aspirin EC 81 MG tablet Take 81 mg by mouth daily with breakfast.    .  atorvastatin (LIPITOR) 80 MG tablet Take 1 tablet (80 mg total) by mouth daily. 90 tablet 3  . Azelastine HCl 137 MCG/SPRAY SOLN Place 1 spray into both nostrils 2 (two) times daily. As needed    . citalopram (CELEXA) 40 MG tablet Take 0.5 tablets by mouth daily.    Marland Kitchen ELIQUIS 5 MG TABS tablet TAKE 1 TABLET BY MOUTH TWICE A DAY 60 tablet 5  . ENTRESTO 97-103 MG TAKE 1 TABLET BY MOUTH TWICE A DAY 180 tablet 2  . finasteride (PROSCAR) 5 MG tablet Take 5 mg by mouth daily.    . isosorbide mononitrate (IMDUR) 30 MG 24 hr tablet TAKE 1 TABLET BY MOUTH EVERY DAY 90 tablet 3  . meloxicam (MOBIC) 15 MG tablet Take 15 mg by mouth daily as needed for pain.     . nitroGLYCERIN (NITROSTAT) 0.4 MG SL tablet PLACE 1 TABLET UNDER THE TONGUE EVERY 5 MINUTES AS NEEDED FOR CHEST PAIN. Please schedule yearly  appointment for future refills. Thank you 75 tablet 0  . spironolactone (ALDACTONE) 25 MG tablet Take 1 tablet (25 mg total) by mouth daily. 90 tablet 3  . tamsulosin (FLOMAX) 0.4 MG CAPS capsule Take 0.4 mg by mouth daily with breakfast.      Current Facility-Administered Medications on File Prior to Visit  Medication Dose Route Frequency Provider Last Rate Last Admin  . regadenoson (LEXISCAN) injection SOLN 0.4 mg  0.4 mg Intravenous Once Lewayne Bunting, MD      . technetium tetrofosmin (TC-MYOVIEW) injection 30.9 millicurie  30.9 millicurie Intravenous Once PRN Lewayne Bunting, MD        Allergies  Allergen Reactions  . Adhesive [Tape] Other (See Comments)    SKIN SCARRING/IRRITATION. PAPER TAPE IS OKAY     Assessment/Plan:  1. Hypertension -

## 2020-10-18 ENCOUNTER — Telehealth: Payer: Self-pay

## 2020-10-18 NOTE — Telephone Encounter (Signed)
Lmom to r/s °

## 2020-10-25 DIAGNOSIS — I2510 Atherosclerotic heart disease of native coronary artery without angina pectoris: Secondary | ICD-10-CM | POA: Diagnosis not present

## 2020-10-25 DIAGNOSIS — I10 Essential (primary) hypertension: Secondary | ICD-10-CM | POA: Diagnosis not present

## 2020-10-25 DIAGNOSIS — R0789 Other chest pain: Secondary | ICD-10-CM | POA: Diagnosis not present

## 2020-10-25 DIAGNOSIS — Z87891 Personal history of nicotine dependence: Secondary | ICD-10-CM | POA: Diagnosis not present

## 2020-11-04 DIAGNOSIS — Z1211 Encounter for screening for malignant neoplasm of colon: Secondary | ICD-10-CM | POA: Diagnosis not present

## 2020-11-12 ENCOUNTER — Other Ambulatory Visit: Payer: Federal, State, Local not specified - PPO

## 2020-12-22 DIAGNOSIS — M7740 Metatarsalgia, unspecified foot: Secondary | ICD-10-CM | POA: Diagnosis not present

## 2020-12-22 DIAGNOSIS — M2141 Flat foot [pes planus] (acquired), right foot: Secondary | ICD-10-CM | POA: Diagnosis not present

## 2020-12-22 DIAGNOSIS — I509 Heart failure, unspecified: Secondary | ICD-10-CM | POA: Diagnosis not present

## 2020-12-22 DIAGNOSIS — R0789 Other chest pain: Secondary | ICD-10-CM | POA: Diagnosis not present

## 2020-12-22 DIAGNOSIS — I2510 Atherosclerotic heart disease of native coronary artery without angina pectoris: Secondary | ICD-10-CM | POA: Diagnosis not present

## 2020-12-22 DIAGNOSIS — I110 Hypertensive heart disease with heart failure: Secondary | ICD-10-CM | POA: Diagnosis not present

## 2020-12-22 DIAGNOSIS — M2142 Flat foot [pes planus] (acquired), left foot: Secondary | ICD-10-CM | POA: Diagnosis not present

## 2020-12-22 DIAGNOSIS — M216X9 Other acquired deformities of unspecified foot: Secondary | ICD-10-CM | POA: Diagnosis not present

## 2021-01-14 DIAGNOSIS — F329 Major depressive disorder, single episode, unspecified: Secondary | ICD-10-CM | POA: Diagnosis not present

## 2021-01-14 DIAGNOSIS — I10 Essential (primary) hypertension: Secondary | ICD-10-CM | POA: Diagnosis not present

## 2021-01-14 DIAGNOSIS — I2510 Atherosclerotic heart disease of native coronary artery without angina pectoris: Secondary | ICD-10-CM | POA: Diagnosis not present

## 2021-01-14 DIAGNOSIS — E7800 Pure hypercholesterolemia, unspecified: Secondary | ICD-10-CM | POA: Diagnosis not present

## 2021-01-14 DIAGNOSIS — M5459 Other low back pain: Secondary | ICD-10-CM | POA: Diagnosis not present

## 2021-01-31 ENCOUNTER — Other Ambulatory Visit: Payer: Federal, State, Local not specified - PPO | Admitting: *Deleted

## 2021-01-31 ENCOUNTER — Other Ambulatory Visit: Payer: Self-pay

## 2021-01-31 DIAGNOSIS — I10 Essential (primary) hypertension: Secondary | ICD-10-CM

## 2021-01-31 DIAGNOSIS — I513 Intracardiac thrombosis, not elsewhere classified: Secondary | ICD-10-CM | POA: Diagnosis not present

## 2021-01-31 DIAGNOSIS — I5042 Chronic combined systolic (congestive) and diastolic (congestive) heart failure: Secondary | ICD-10-CM

## 2021-01-31 DIAGNOSIS — E785 Hyperlipidemia, unspecified: Secondary | ICD-10-CM

## 2021-01-31 DIAGNOSIS — I2510 Atherosclerotic heart disease of native coronary artery without angina pectoris: Secondary | ICD-10-CM

## 2021-01-31 LAB — COMPREHENSIVE METABOLIC PANEL
ALT: 17 IU/L (ref 0–44)
AST: 21 IU/L (ref 0–40)
Albumin/Globulin Ratio: 2 (ref 1.2–2.2)
Albumin: 4.2 g/dL (ref 3.8–4.9)
Alkaline Phosphatase: 84 IU/L (ref 44–121)
BUN/Creatinine Ratio: 13 (ref 9–20)
BUN: 15 mg/dL (ref 6–24)
Bilirubin Total: 0.2 mg/dL (ref 0.0–1.2)
CO2: 24 mmol/L (ref 20–29)
Calcium: 9 mg/dL (ref 8.7–10.2)
Chloride: 106 mmol/L (ref 96–106)
Creatinine, Ser: 1.18 mg/dL (ref 0.76–1.27)
Globulin, Total: 2.1 g/dL (ref 1.5–4.5)
Glucose: 92 mg/dL (ref 70–99)
Potassium: 4.4 mmol/L (ref 3.5–5.2)
Sodium: 141 mmol/L (ref 134–144)
Total Protein: 6.3 g/dL (ref 6.0–8.5)
eGFR: 72 mL/min/{1.73_m2} (ref >59)

## 2021-01-31 LAB — LIPID PANEL
Chol/HDL Ratio: 3.2 ratio (ref 0.0–5.0)
Cholesterol, Total: 136 mg/dL (ref 100–199)
HDL: 42 mg/dL (ref >39)
LDL Chol Calc (NIH): 84 mg/dL (ref 0–99)
Triglycerides: 45 mg/dL (ref 0–149)
VLDL Cholesterol Cal: 10 mg/dL (ref 5–40)

## 2021-02-02 NOTE — Progress Notes (Signed)
Patient ID: Austin Ingram                 DOB: 1961/03/20                      MRN: [id number]     HPI: Austin Ingram is a 60 y.o. male patient of Dr. Gibson Ramp referred by Ronie Spies, PA to pharmacy clinic for HF medication management. PMH is significant for HTN, CAD (with BMS and DESx2), chronic stable angina, CHF, HLD, sinus bradycardia, CKD Stage II, mild OSA improved with weight loss. Most recent LVEF 30-35% on 07/2020. Patient was referred to PharmD clinic for optimization of his medications.  Patient presents today to PharmD clinic. 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 States that his activity level has decreased since his "heart event" several years ago. He admits to chest tightness while he is mowing/walking. He feels SOB and like his chest will burst. If he pushed through then it will just pass and he feels like everything just opens up. Most of the time he sits and rests. He does check his BP at home sometimes. Typically 110-120/90.  He denies dizziness, lightheadedness, and fatigue. Denies chest pain or palpitations (with the exception of above). Feels SOB when he is physically exerting himself. Able to complete all ADLs. Activity level is down. He does checks his weight at home (normal range 198 - 205 lbs). Denies LEE, PND, or orthopnea.   He does have an NSAID on his med list. He does not take it often and is aware of the GI/cardiovascular risk. He brings in all his medications today to reconcile. BP high in clinic today.  Current CHF meds: spironolactone 25 mg, entresto 97-103 mg Other meds: amlodipine 10 mg daily Previously tried: hctz 25 mg tablet BP goal: <130/80  Family History: Mother's history includes T2DM, HTN  Social History: Former smoker 10 pack years (quit 12/1998) Smokes marijuana 2 joints/day, occasional EtOh use  Diet: not addressed today  Exercise: mows yard, brisk walk 3-4 times a week  Home BP readings: usually 110-120/90  Wt Readings from Last 3  Encounters:  09/02/20 209 lb 9.6 oz (95.1 kg)  08/16/20 208 lb (94.3 kg)  08/12/20 208 lb 12.8 oz (94.7 kg)   BP Readings from Last 3 Encounters:  09/02/20 (!) 142/86  08/12/20 (!) 144/92  12/24/19 (!) 140/96   Pulse Readings from Last 3 Encounters:  09/02/20 (!) 55  08/12/20 (!) 53  12/24/19 (!) 53    Renal function: CrCl cannot be calculated (Unknown ideal weight.).  Past Medical History:  Diagnosis Date   Allergic rhinitis    Anxiety    Arthritis    "lower back, right thumb, knees" (10/04/2012)   Asthma    "grew out of it" (10/04/2012)   CKD (chronic kidney disease), stage II    Coronary artery disease    a. s/p prior stent to LAD;  b. LHC 6/14: DES to pRCA. c. DES to dCx 01/2017 with residual disease.   Depression    Hyperlipidemia    Hypertension    Ischemic cardiomyopathy    LV (left ventricular) mural thrombus    MI (myocardial infarction) (HCC) 03/2007   OSA (obstructive sleep apnea)    mild   Pneumonia    "as a child" (10/04/2012)   Sinus bradycardia     Current Outpatient Medications on File Prior to Visit  Medication Sig Dispense Refill   acetaminophen (TYLENOL) 325 MG tablet  Take 1 tablet by mouth every 6 (six) hours as needed.     acetaminophen-codeine (TYLENOL #3) 300-30 MG tablet Take 1 tablet by mouth as needed for moderate pain.      amLODipine (NORVASC) 10 MG tablet Take 0.5 tablets (5 mg total) by mouth daily. 45 tablet 0   amoxicillin (AMOXIL) 500 MG capsule TAKE 4 CAPSULES ONE HOUR PRIOR TO EACH DENTAL APPOINTMENT     aspirin EC 81 MG tablet Take 81 mg by mouth daily with breakfast.     atorvastatin (LIPITOR) 80 MG tablet Take 1 tablet (80 mg total) by mouth daily. 90 tablet 3   Azelastine HCl 137 MCG/SPRAY SOLN Place 1 spray into both nostrils 2 (two) times daily. As needed     citalopram (CELEXA) 40 MG tablet Take 0.5 tablets by mouth daily.     ELIQUIS 5 MG TABS tablet TAKE 1 TABLET BY MOUTH TWICE A DAY 60 tablet 5   ENTRESTO 97-103 MG TAKE 1  TABLET BY MOUTH TWICE A DAY 180 tablet 2   finasteride (PROSCAR) 5 MG tablet Take 5 mg by mouth daily.     isosorbide mononitrate (IMDUR) 30 MG 24 hr tablet TAKE 1 TABLET BY MOUTH EVERY DAY 90 tablet 3   meloxicam (MOBIC) 15 MG tablet Take 15 mg by mouth daily as needed for pain.      nitroGLYCERIN (NITROSTAT) 0.4 MG SL tablet PLACE 1 TABLET UNDER THE TONGUE EVERY 5 MINUTES AS NEEDED FOR CHEST PAIN. Please schedule yearly appointment for future refills. Thank you 75 tablet 0   spironolactone (ALDACTONE) 25 MG tablet Take 1 tablet (25 mg total) by mouth daily. 90 tablet 3   tamsulosin (FLOMAX) 0.4 MG CAPS capsule Take 0.4 mg by mouth daily with breakfast.      Current Facility-Administered Medications on File Prior to Visit  Medication Dose Route Frequency Provider Last Rate Last Admin   regadenoson (LEXISCAN) injection SOLN 0.4 mg  0.4 mg Intravenous Once Lewayne Bunting, MD       technetium tetrofosmin (TC-MYOVIEW) injection 30.9 millicurie  30.9 millicurie Intravenous Once PRN Jens Som Madolyn Frieze, MD        Allergies  Allergen Reactions   Adhesive [Tape] Other (See Comments)    SKIN SCARRING/IRRITATION. PAPER TAPE IS OKAY     Assessment/Plan:  1. CHF -  Patient's blood pressure is high in clinic today. He is on 2 of the 4 HF pillars. Cannot use BB due to low resting HR. Will start Farxiga 10mg  daily. He was given a copay card to get his copay down to $0. Will also start isosorbide mononitrate 30mg  daily. I have asked him to check his blood pressure once daily and bring in readings and BP cuff to next visit. Follow up in 2.5 weeks. If BP is high next visit, will add hydralazine.  Will send message to Dr. about his chest pressure/SOB during exertion.  Thank you,  , Pharm.D, BCPS, CPP [facility]  [address] [place], [address] [place]  Phone: [phone]; Fax: [phone]

## 2021-02-03 ENCOUNTER — Ambulatory Visit: Payer: Federal, State, Local not specified - PPO | Admitting: Pharmacist

## 2021-02-03 ENCOUNTER — Other Ambulatory Visit: Payer: Self-pay

## 2021-02-03 VITALS — BP 148/90 | HR 54 | Wt 200.0 lb

## 2021-02-03 DIAGNOSIS — I255 Ischemic cardiomyopathy: Secondary | ICD-10-CM | POA: Diagnosis not present

## 2021-02-03 DIAGNOSIS — I10 Essential (primary) hypertension: Secondary | ICD-10-CM

## 2021-02-03 MED ORDER — ISOSORBIDE MONONITRATE ER 30 MG PO TB24: 30.0000 mg | ORAL_TABLET | Freq: Every day | ORAL | 3 refills | Status: DC

## 2021-02-03 MED ORDER — DAPAGLIFLOZIN PROPANEDIOL 10 MG PO TABS: 10.0000 mg | ORAL_TABLET | Freq: Every day | ORAL | 11 refills | Status: DC

## 2021-02-03 NOTE — Patient Instructions (Addendum)
Start taking isosorbide 30mg  daily  Start taking Farxiga 10mg  daily  Continue spironolactone 25 mg, entresto 97-103 mg and amlodipine 10mg  daily  Check your blood pressure once a day- record readings and bring with you to your next visit along with your blood pressure machine  Call me at [phone] with any questions

## 2021-02-04 ENCOUNTER — Telehealth: Payer: Self-pay | Admitting: Physician Assistant

## 2021-02-04 NOTE — Telephone Encounter (Signed)
Called returned by patient/his wife.  Pt in background.  He did not know why we needed to see him today.  I adv of his reported symptoms yesterday with PharmD.  I adv there is no more availability for today now but we could add him with APP Monday am. Per his wife he is "all out of sorts" because he has to go out of town today to see his Mom, they changed his medicine yesterday and he got this call this morning.  I asked about the symptoms.  Pt states he has been coming and going for 3 months.  He doesn't know when he is returning from visit at his mothers.   They would like to call when he comes back to make an appointment.

## 2021-02-04 NOTE — Telephone Encounter (Signed)
Left message for patient to call back.  He can be added to DOD schedule today at 1:40 pm.

## 2021-02-04 NOTE — Telephone Encounter (Signed)
   PharmD saw patient yesterday in follow-up and cc'd the following message to Dr. Clifton James and I: "Patient reports chest pressure ans SOB 'like he is going to burst' during exertion. I did resume his isosorbide. Just an FYI "  Mr. Caldas does not usually complain of chest pain therefore this is concerning to me. He does have a history of chronic DOE but has not had chest pain/pressure in recent visits. At last OV 08/2020 I had referred him to the CHF clinic and close follow-up with [place] but does not look like this occurred. Given the chest discomfort I would suggest ASAP OV if we are able to get him in (?DOD slot). If not, may need to go to the ED for concern for unstable angina. Will route to triage to assist. Will CC Dr. Clifton James as [place]ain Childes.  Renad Jenniges [name]

## 2021-02-04 NOTE — Telephone Encounter (Addendum)
Thank you Michalene. I also had recommended referral to [facility] back in 08/2020. I do not see this ever got scheduled so please make sure that is in the works as well. Would still need general cardiology f/u in the meantime as suggested as it may be weeks before they could see him. Appreciate your help.

## 2021-02-07 ENCOUNTER — Ambulatory Visit (HOSPITAL_BASED_OUTPATIENT_CLINIC_OR_DEPARTMENT_OTHER): Payer: Federal, State, Local not specified - PPO | Admitting: Family

## 2021-02-20 NOTE — Progress Notes (Unsigned)
Patient ID: Austin Ingram                 DOB: [birth year]                      MRN: [id number]     HPI: Austin Ingram is a 60 y.o. male patient of Dr. Gibson Ramp referred by Ronie Spies, PA to pharmacy clinic for HF medication management. PMH is significant for HTN, CAD (with BMS and DESx2), chronic stable angina, CHF, HLD, sinus bradycardia, CKD Stage II, mild OSA improved with weight loss. Most recent LVEF 30-35% on 07/2020. Patient was referred to PharmD clinic for optimization of his medications.  At his last visit with Pharm D on 10/6, Also, patient reported he feels SOB when he exerts himself and as if his chest will burst. If he pushed through then it will just pass and he feels like "everything just opens up." Symptoms were routed to Dr. Clifton James and Dr.Dunn, and Marcelline Deist 10 mg and isosorbide mononitrate 30 mg daily were started. In phone note on 10/7, Dr. Shea Evans referred patient for ASAP OV (DOD slot?) for unstable angina , but patient stated he is going out of town to see his mom. Also, Dr. Shea Evans noted she had referred patient to [facility] in 08/2020. Today, patient is here for follow-up.  --------------------------------------- Thoughts:  patient was routed to Dr. Sylvester Harder to be seen by [facility] - did he go to ASAP OV  Goals: - assess symptoms - has he restarted isosorbide and started farxiga? - ask about the office visit  Interventions: - add hydralazine - cant add BB cause his heart rate is too low (50s) ----------------------------   Current CHF meds: spironolactone 25 mg, entresto 97-103 mg Other meds: amlodipine 10 mg daily Previously tried: hctz 25 mg tablet BP goal: <130/80   Family History: Mother's history includes T2DM, HTN   Social History: Former smoker 10 pack years (quit 12/1998) Smokes marijuana 2 joints/day, occasional EtOh use   Diet: not addressed today   Exercise: mows yard, brisk walk 3-4 times a week   Home BP readings: usually 110-120/90  Wt Readings from  Last 3 Encounters:  02/03/21 200 lb (90.7 kg)  09/02/20 209 lb 9.6 oz (95.1 kg)  08/16/20 208 lb (94.3 kg)   BP Readings from Last 3 Encounters:  02/03/21 (!) 148/90  09/02/20 (!) 142/86  08/12/20 (!) 144/92   Pulse Readings from Last 3 Encounters:  02/03/21 (!) 54  09/02/20 (!) 55  08/12/20 (!) 53    Renal function: CrCl cannot be calculated (Unknown ideal weight.).  Past Medical History:  Diagnosis Date   Allergic rhinitis    Anxiety    Arthritis    "lower back, right thumb, knees" (10/04/2012)   Asthma    "grew out of it" (10/04/2012)   CKD (chronic kidney disease), stage II    Coronary artery disease    a. s/p prior stent to LAD;  b. LHC 6/14: DES to pRCA. c. DES to dCx 01/2017 with residual disease.   Depression    Hyperlipidemia    Hypertension    Ischemic cardiomyopathy    LV (left ventricular) mural thrombus    MI (myocardial infarction) (HCC) 03/2007   OSA (obstructive sleep apnea)    mild   Pneumonia    "as a child" (10/04/2012)   Sinus bradycardia     Current Outpatient Medications on File Prior to Visit  Medication Sig Dispense Refill   acetaminophen (TYLENOL)  325 MG tablet Take 1 tablet by mouth every 6 (six) hours as needed.     acetaminophen-codeine (TYLENOL #3) 300-30 MG tablet Take 1 tablet by mouth as needed for moderate pain.  (Patient not taking: Reported on 02/03/2021)     amLODipine (NORVASC) 10 MG tablet Take 10 mg by mouth daily.     amoxicillin (AMOXIL) 500 MG capsule TAKE 4 CAPSULES ONE HOUR PRIOR TO EACH DENTAL APPOINTMENT     aspirin EC 81 MG tablet Take 81 mg by mouth daily with breakfast.     atorvastatin (LIPITOR) 20 MG tablet Take 20 mg by mouth daily.     Azelastine HCl 137 MCG/SPRAY SOLN Place 1 spray into both nostrils 2 (two) times daily. As needed     citalopram (CELEXA) 40 MG tablet Take 0.5 tablets by mouth daily.     dapagliflozin propanediol (FARXIGA) 10 MG TABS tablet Take 1 tablet (10 mg total) by mouth daily before breakfast.  30 tablet 11   ELIQUIS 5 MG TABS tablet TAKE 1 TABLET BY MOUTH TWICE A DAY 60 tablet 5   ENTRESTO 97-103 MG TAKE 1 TABLET BY MOUTH TWICE A DAY 180 tablet 2   finasteride (PROSCAR) 5 MG tablet Take 5 mg by mouth daily.     isosorbide mononitrate (IMDUR) 30 MG 24 hr tablet Take 1 tablet (30 mg total) by mouth daily. 90 tablet 3   nabumetone (RELAFEN) 500 MG tablet Take 500 mg by mouth as needed.     nitroGLYCERIN (NITROSTAT) 0.4 MG SL tablet PLACE 1 TABLET UNDER THE TONGUE EVERY 5 MINUTES AS NEEDED FOR CHEST PAIN. Please schedule yearly appointment for future refills. Thank you 75 tablet 0   spironolactone (ALDACTONE) 25 MG tablet Take 1 tablet (25 mg total) by mouth daily. 90 tablet 3   tamsulosin (FLOMAX) 0.4 MG CAPS capsule Take 0.4 mg by mouth daily with breakfast.      Current Facility-Administered Medications on File Prior to Visit  Medication Dose Route Frequency Provider Last Rate Last Admin   regadenoson (LEXISCAN) injection SOLN 0.4 mg  0.4 mg Intravenous Once Lewayne Bunting, MD       technetium tetrofosmin (TC-MYOVIEW) injection 30.9 millicurie  30.9 millicurie Intravenous Once PRN Jens Som Madolyn Frieze, MD        Allergies  Allergen Reactions   Adhesive [Tape] Other (See Comments)    SKIN SCARRING/IRRITATION. PAPER TAPE IS OKAY     Assessment/Plan:  1. CHF -

## 2021-02-21 ENCOUNTER — Ambulatory Visit: Payer: Federal, State, Local not specified - PPO

## 2021-03-09 DIAGNOSIS — I252 Old myocardial infarction: Secondary | ICD-10-CM | POA: Diagnosis not present

## 2021-03-09 DIAGNOSIS — I517 Cardiomegaly: Secondary | ICD-10-CM | POA: Diagnosis not present

## 2021-03-09 DIAGNOSIS — N62 Hypertrophy of breast: Secondary | ICD-10-CM | POA: Diagnosis not present

## 2021-03-09 DIAGNOSIS — K449 Diaphragmatic hernia without obstruction or gangrene: Secondary | ICD-10-CM | POA: Diagnosis not present

## 2021-03-21 ENCOUNTER — Ambulatory Visit (HOSPITAL_COMMUNITY)
Admission: RE | Admit: 2021-03-21 | Discharge: 2021-03-21 | Disposition: A | Discharge status: Home / Self Care | Payer: Federal, State, Local not specified - PPO | Source: Ambulatory Visit | Attending: Cardiology | Admitting: Cardiology

## 2021-03-21 ENCOUNTER — Encounter (HOSPITAL_COMMUNITY): Payer: Self-pay | Admitting: Cardiology

## 2021-03-21 VITALS — BP 140/80 | HR 59 | Wt 204.6 lb

## 2021-03-21 DIAGNOSIS — I2510 Atherosclerotic heart disease of native coronary artery without angina pectoris: Secondary | ICD-10-CM | POA: Insufficient documentation

## 2021-03-21 DIAGNOSIS — Z87891 Personal history of nicotine dependence: Secondary | ICD-10-CM | POA: Insufficient documentation

## 2021-03-21 DIAGNOSIS — I110 Hypertensive heart disease with heart failure: Secondary | ICD-10-CM | POA: Diagnosis not present

## 2021-03-21 DIAGNOSIS — I5042 Chronic combined systolic (congestive) and diastolic (congestive) heart failure: Secondary | ICD-10-CM | POA: Diagnosis not present

## 2021-03-21 DIAGNOSIS — Z955 Presence of coronary angioplasty implant and graft: Secondary | ICD-10-CM | POA: Diagnosis not present

## 2021-03-21 DIAGNOSIS — I513 Intracardiac thrombosis, not elsewhere classified: Secondary | ICD-10-CM | POA: Diagnosis not present

## 2021-03-21 DIAGNOSIS — I255 Ischemic cardiomyopathy: Secondary | ICD-10-CM | POA: Diagnosis not present

## 2021-03-21 DIAGNOSIS — I252 Old myocardial infarction: Secondary | ICD-10-CM | POA: Diagnosis not present

## 2021-03-21 DIAGNOSIS — Z79899 Other long term (current) drug therapy: Secondary | ICD-10-CM | POA: Diagnosis not present

## 2021-03-21 DIAGNOSIS — I5022 Chronic systolic (congestive) heart failure: Secondary | ICD-10-CM | POA: Diagnosis not present

## 2021-03-21 DIAGNOSIS — Z7901 Long term (current) use of anticoagulants: Secondary | ICD-10-CM | POA: Diagnosis not present

## 2021-03-21 LAB — BASIC METABOLIC PANEL
Anion gap: 7 (ref 5–15)
BUN: 14 mg/dL (ref 6–20)
CO2: 28 mmol/L (ref 22–32)
Calcium: 8.8 mg/dL — ABNORMAL LOW (ref 8.9–10.3)
Chloride: 107 mmol/L (ref 98–111)
Creatinine, Ser: 1.31 mg/dL — ABNORMAL HIGH (ref 0.61–1.24)
GFR, Estimated: >60 mL/min (ref >60)
Glucose, Bld: 82 mg/dL (ref 70–99)
Potassium: 3.9 mmol/L (ref 3.5–5.1)
Sodium: 142 mmol/L (ref 135–145)

## 2021-03-21 LAB — BRAIN NATRIURETIC PEPTIDE: B Natriuretic Peptide: 125.9 pg/mL — ABNORMAL HIGH (ref 0.0–100.0)

## 2021-03-21 MED ORDER — HYDRALAZINE HCL 25 MG PO TABS: 25.0000 mg | ORAL_TABLET | Freq: Three times a day (TID) | ORAL | 11 refills | Status: DC

## 2021-03-21 MED ORDER — ATORVASTATIN CALCIUM 40 MG PO TABS: 40.0000 mg | ORAL_TABLET | Freq: Every day | ORAL | 3 refills | Status: DC

## 2021-03-21 MED ORDER — CARVEDILOL 3.125 MG PO TABS: 3.1250 mg | ORAL_TABLET | Freq: Two times a day (BID) | ORAL | 11 refills | Status: DC

## 2021-03-21 NOTE — Patient Instructions (Signed)
EKG done today.  Labs done today. We will contact you only if your labs are abnormal.  INCREASE Atorvastatin to 40mg  (1 tablet) by mouth daily.  START Carvedilol 3.125mg  (1 tablet) by mouth 2 times daily.   START Hydralazine 25mg  (1 tablet) by mouth 3 times daily.   STOP taking Aspirin  No other medication changes were made. Please continue all current medications as prescribed.  Your physician recommends that you schedule a follow-up appointment in: 3 weeks with our Clinic Pharmacist and in 2 months with Dr. .  If you have any questions or concerns before your next appointment please send a message through [place] or call our office at [phone].    TO LEAVE A MESSAGE FOR THE NURSE SELECT OPTION 2, PLEASE LEAVE A MESSAGE INCLUDING: YOUR NAME DATE OF BIRTH CALL BACK NUMBER REASON FOR CALL**this is important as we prioritize the call backs  YOU WILL RECEIVE A CALL BACK THE SAME DAY AS LONG AS YOU CALL BEFORE 4:00 PM   Do the following things EVERYDAY: Weigh yourself in the morning before breakfast. Write it down and keep it in a log. Take your medicines as prescribed Eat low salt foods--Limit salt (sodium) to 2000 mg per day.  Stay as active as you can everyday Limit all fluids for the day to less than 2 liters   At the [facility], you and your health needs are our priority. As part of our continuing mission to provide you with exceptional heart care, we have created designated Provider Care Teams. These Care Teams include your primary Cardiologist (physician) and Advanced Practice Providers (APPs- Physician Assistants and Nurse Practitioners) who all work together to provide you with the care you need, when you need it.   You may see any of the following providers on your designated Care Team at your next follow up: Dr [place] Dr [phone], NP Arvilla Meres, Carron Curie Robbie Lis, PharmD   Please be sure to bring in all  your medications bottles to every appointment.

## 2021-03-22 NOTE — Progress Notes (Addendum)
PCP: Clovis Riley, L.[name], MD Cardiology: Dr. Clifton James HF Cardiology: Dr. Shirlee Latch  60 y.o. with history of HTN, ischemic cardiomyopathy, CAD, and LV thrombus was referred by Dr. Clifton James for evaluation of CHF.  Patient had initial PCI in 2004 to proximal RCA.  In 2008, had had anterior MI with BMS to LAD.  In 10/18, he had DES to distal LCx (RCA noted to be chronically occluded).  He has chronic systolic CHF and has been noted to have an LV thrombus in the past.  Most recent echo in 4/22 showed EF 30-35%, normal RV.   Patient is doing well symptomatically.  He has mild dyspnea walking fast up stairs, no dyspnea walking on flat ground. He walks for exercise.  No orthopnea/PND. No chest pain.  No palpitations.  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    ECG (personally reviewed): NSR, old ASMI, inferolateral TWIs.   Labs (10/22): LDL 84, HDl 42, K 4.4, creatinine 5.99  PMH:  1. HTN 2. Hyperlipidemia 3. H/o LV thrombus 4. CAD:  - DES to proximal RCA in 2004 - Anterior MI in 2008 with BMS to LAD.  - DES to distal LCx in 10/18, also noted to have CTO RCA.  - Cardiolite (4/22): EF 31%, peri-apical fixed defect with no ischemia.  5. Chronic systolic CHF: Ischemic cardiomyopathy.   - Echo (11/20): EF 40-45%, apical thrombus.  - Echo (6/21): EF 35-40%, normal RV - Echo (4/22): EF 30-35%, LAD territory WMAs, normal RV.   SH: Married, retired from the post office, occasional marijuana, occasional ETOH, remote smoker.   Family History  Problem Relation Age of Onset   Atrial fibrillation Mother        irregular heart beats   Hypertension Mother    Diabetes type II Mother    CAD Maternal Grandfather    CAD Maternal Grandmother    ROS: All systems reviewed and negative except as per HPI.   Current Outpatient Medications  Medication Sig Dispense Refill   acetaminophen (TYLENOL) 325 MG tablet Take 1  tablet by mouth every 6 (six) hours as needed.     amLODipine (NORVASC) 10 MG tablet Take 10 mg by mouth daily.     amoxicillin (AMOXIL) 500 MG capsule TAKE 4 CAPSULES ONE HOUR PRIOR TO EACH DENTAL APPOINTMENT     Azelastine HCl 137 MCG/SPRAY SOLN Place 1 spray into both nostrils 2 (two) times daily. As needed     carvedilol (COREG) 3.125 MG tablet Take 1 tablet (3.125 mg total) by mouth 2 (two) times daily. 60 tablet 11   citalopram (CELEXA) 40 MG tablet Take 0.5 tablets by mouth daily.     dapagliflozin propanediol (FARXIGA) 10 MG TABS tablet Take 1 tablet (10 mg total) by mouth daily before breakfast. 30 tablet 11   ELIQUIS 5 MG TABS tablet TAKE 1 TABLET BY MOUTH TWICE A DAY 60 tablet 5   ENTRESTO 97-103 MG TAKE 1 TABLET BY MOUTH TWICE A DAY 180 tablet 2   finasteride (PROSCAR) 5 MG tablet Take 5 mg by mouth daily.     hydrALAZINE (APRESOLINE) 25 MG tablet Take 1 tablet (25 mg total) by mouth 3 (three) times daily. 90 tablet 11   isosorbide mononitrate (IMDUR) 30 MG 24 hr tablet Take 1 tablet (30 mg total) by mouth daily. 90 tablet 3   nabumetone (RELAFEN) 500 MG tablet Take 500 mg by mouth as  needed.     nitroGLYCERIN (NITROSTAT) 0.4 MG SL tablet PLACE 1 TABLET UNDER THE TONGUE EVERY 5 MINUTES AS NEEDED FOR CHEST PAIN. Please schedule yearly appointment for future refills. Thank you 75 tablet 0   spironolactone (ALDACTONE) 25 MG tablet Take 1 tablet (25 mg total) by mouth daily. 90 tablet 3   tamsulosin (FLOMAX) 0.4 MG CAPS capsule Take 0.4 mg by mouth daily with breakfast.      atorvastatin (LIPITOR) 40 MG tablet Take 1 tablet (40 mg total) by mouth daily. 90 tablet 3   No current facility-administered medications for this encounter.   Facility-Administered Medications Ordered in Other Encounters  Medication Dose Route Frequency Provider Last Rate Last Admin   regadenoson (LEXISCAN) injection SOLN 0.4 mg  0.4 mg Intravenous Once Lewayne Bunting, MD       technetium tetrofosmin  (TC-MYOVIEW) injection 30.9 millicurie  30.9 millicurie Intravenous Once PRN Crenshaw, Madolyn Frieze, MD       BP 140/80   Pulse (!) 59   Wt 92.8 kg (204 lb 9.6 oz)   SpO2 98%   BMI 30.21 kg/m  General: NAD Neck: No JVD, no thyromegaly or thyroid nodule.  Lungs: Clear to auscultation bilaterally with normal respiratory effort. CV: Nondisplaced PMI.  Heart regular S1/S2, no S3/S4, no murmur.  No peripheral edema.  No carotid bruit.  Normal pedal pulses.  Abdomen: Soft, nontender, no hepatosplenomegaly, no distention.  Skin: Intact without lesions or rashes.  Neurologic: Alert and oriented x 3.  Psych: Normal affect. Extremities: No clubbing or cyanosis.  HEENT: Normal.   Assessment/Plan: 1. CAD: No recent chest pain.  Has history of anterior MI in 2008. Has CTO RCA known from 10/18 cath.   - Given Eliquis use, he can stop ASA.  - With LDL > 70, I will increase atorvastatin to 40 mg daily. Lipids/LFTs in 2 months.  2. Chronic systolic CHF: Ischemic cardiomyopathy.  Echo in 11/22 with EF 30-35% and peri-apical wall motion abnormalities.  - HR upper 50s, I think he should be able to tolerate low dose Coreg, start 3.125 mg bid.  - Add hydralazine 25 mg tid and continue Imdur 30. Continue to titrate up hydralazine/Imdur as BP allows.                                                                                                                                           - Continue dapagliflozin 10 mg daily.  - Continue spironolactone 25 daily.  BMET today.  - Continue Entresto 97/103 bid.  - Repeat echo in 3 months to see if EF has improved further.  If EF remains low, he will need an ICD (not CRT candidate with narrow QRS).  3. LV thrombus: Present in the past.  Continue Eliquis.   Followup HF pharmacist in 3 wks for med titration, see me in 2 months.   Ernisha Sorn [employer]  03/22/2021  

## 2021-03-27 ENCOUNTER — Inpatient Hospital Stay (HOSPITAL_COMMUNITY)
Admission: EM | Admit: 2021-03-27 | Discharge: 2021-03-29 | DRG: 281 | Disposition: A | Discharge status: Home / Self Care | Payer: Federal, State, Local not specified - PPO | Attending: Family Medicine | Admitting: Family Medicine

## 2021-03-27 ENCOUNTER — Emergency Department (HOSPITAL_COMMUNITY): Payer: Federal, State, Local not specified - PPO

## 2021-03-27 ENCOUNTER — Encounter (HOSPITAL_COMMUNITY): Payer: Self-pay | Admitting: Emergency Medicine

## 2021-03-27 DIAGNOSIS — I214 Non-ST elevation (NSTEMI) myocardial infarction: Secondary | ICD-10-CM | POA: Diagnosis not present

## 2021-03-27 DIAGNOSIS — Z20822 Contact with and (suspected) exposure to covid-19: Secondary | ICD-10-CM | POA: Diagnosis present

## 2021-03-27 DIAGNOSIS — Z7901 Long term (current) use of anticoagulants: Secondary | ICD-10-CM

## 2021-03-27 DIAGNOSIS — D735 Infarction of spleen: Secondary | ICD-10-CM | POA: Diagnosis present

## 2021-03-27 DIAGNOSIS — Z955 Presence of coronary angioplasty implant and graft: Secondary | ICD-10-CM

## 2021-03-27 DIAGNOSIS — R778 Other specified abnormalities of plasma proteins: Secondary | ICD-10-CM

## 2021-03-27 DIAGNOSIS — M47819 Spondylosis without myelopathy or radiculopathy, site unspecified: Secondary | ICD-10-CM | POA: Diagnosis not present

## 2021-03-27 DIAGNOSIS — I513 Intracardiac thrombosis, not elsewhere classified: Secondary | ICD-10-CM | POA: Diagnosis not present

## 2021-03-27 DIAGNOSIS — Z8249 Family history of ischemic heart disease and other diseases of the circulatory system: Secondary | ICD-10-CM

## 2021-03-27 DIAGNOSIS — I252 Old myocardial infarction: Secondary | ICD-10-CM

## 2021-03-27 DIAGNOSIS — Z87891 Personal history of nicotine dependence: Secondary | ICD-10-CM

## 2021-03-27 DIAGNOSIS — I255 Ischemic cardiomyopathy: Secondary | ICD-10-CM | POA: Diagnosis not present

## 2021-03-27 DIAGNOSIS — N182 Chronic kidney disease, stage 2 (mild): Secondary | ICD-10-CM | POA: Diagnosis present

## 2021-03-27 DIAGNOSIS — E785 Hyperlipidemia, unspecified: Secondary | ICD-10-CM | POA: Diagnosis not present

## 2021-03-27 DIAGNOSIS — N400 Enlarged prostate without lower urinary tract symptoms: Secondary | ICD-10-CM | POA: Diagnosis not present

## 2021-03-27 DIAGNOSIS — Z6828 Body mass index (BMI) 28.0-28.9, adult: Secondary | ICD-10-CM

## 2021-03-27 DIAGNOSIS — I5022 Chronic systolic (congestive) heart failure: Secondary | ICD-10-CM | POA: Diagnosis present

## 2021-03-27 DIAGNOSIS — I10 Essential (primary) hypertension: Secondary | ICD-10-CM | POA: Diagnosis not present

## 2021-03-27 DIAGNOSIS — Z79899 Other long term (current) drug therapy: Secondary | ICD-10-CM

## 2021-03-27 DIAGNOSIS — I5042 Chronic combined systolic (congestive) and diastolic (congestive) heart failure: Secondary | ICD-10-CM

## 2021-03-27 DIAGNOSIS — R935 Abnormal findings on diagnostic imaging of other abdominal regions, including retroperitoneum: Secondary | ICD-10-CM

## 2021-03-27 DIAGNOSIS — F419 Anxiety disorder, unspecified: Secondary | ICD-10-CM | POA: Diagnosis present

## 2021-03-27 DIAGNOSIS — Z7982 Long term (current) use of aspirin: Secondary | ICD-10-CM

## 2021-03-27 DIAGNOSIS — R079 Chest pain, unspecified: Secondary | ICD-10-CM | POA: Diagnosis not present

## 2021-03-27 DIAGNOSIS — J309 Allergic rhinitis, unspecified: Secondary | ICD-10-CM | POA: Diagnosis present

## 2021-03-27 DIAGNOSIS — I25118 Atherosclerotic heart disease of native coronary artery with other forms of angina pectoris: Secondary | ICD-10-CM | POA: Diagnosis not present

## 2021-03-27 DIAGNOSIS — I2510 Atherosclerotic heart disease of native coronary artery without angina pectoris: Secondary | ICD-10-CM | POA: Diagnosis not present

## 2021-03-27 DIAGNOSIS — I130 Hypertensive heart and chronic kidney disease with heart failure and stage 1 through stage 4 chronic kidney disease, or unspecified chronic kidney disease: Secondary | ICD-10-CM | POA: Diagnosis not present

## 2021-03-27 DIAGNOSIS — Z8701 Personal history of pneumonia (recurrent): Secondary | ICD-10-CM

## 2021-03-27 DIAGNOSIS — S3600XA Unspecified injury of spleen, initial encounter: Secondary | ICD-10-CM

## 2021-03-27 DIAGNOSIS — R93429 Abnormal radiologic findings on diagnostic imaging of unspecified kidney: Secondary | ICD-10-CM

## 2021-03-27 DIAGNOSIS — Z91048 Other nonmedicinal substance allergy status: Secondary | ICD-10-CM

## 2021-03-27 DIAGNOSIS — M179 Osteoarthritis of knee, unspecified: Secondary | ICD-10-CM | POA: Diagnosis present

## 2021-03-27 DIAGNOSIS — F32A Depression, unspecified: Secondary | ICD-10-CM | POA: Diagnosis not present

## 2021-03-27 DIAGNOSIS — G4733 Obstructive sleep apnea (adult) (pediatric): Secondary | ICD-10-CM | POA: Diagnosis not present

## 2021-03-27 DIAGNOSIS — R109 Unspecified abdominal pain: Secondary | ICD-10-CM | POA: Diagnosis not present

## 2021-03-27 DIAGNOSIS — R1032 Left lower quadrant pain: Secondary | ICD-10-CM | POA: Diagnosis not present

## 2021-03-27 DIAGNOSIS — R0789 Other chest pain: Secondary | ICD-10-CM | POA: Diagnosis not present

## 2021-03-27 DIAGNOSIS — M19041 Primary osteoarthritis, right hand: Secondary | ICD-10-CM | POA: Diagnosis present

## 2021-03-27 LAB — COMPREHENSIVE METABOLIC PANEL
ALT: 18 U/L (ref 0–44)
AST: 28 U/L (ref 15–41)
Albumin: 4.5 g/dL (ref 3.5–5.0)
Alkaline Phosphatase: 71 U/L (ref 38–126)
Anion gap: 6 (ref 5–15)
BUN: 16 mg/dL (ref 6–20)
CO2: 25 mmol/L (ref 22–32)
Calcium: 8.9 mg/dL (ref 8.9–10.3)
Chloride: 108 mmol/L (ref 98–111)
Creatinine, Ser: 1.1 mg/dL (ref 0.61–1.24)
GFR, Estimated: >60 mL/min (ref >60)
Glucose, Bld: 128 mg/dL — ABNORMAL HIGH (ref 70–99)
Potassium: 3.7 mmol/L (ref 3.5–5.1)
Sodium: 139 mmol/L (ref 135–145)
Total Bilirubin: 0.6 mg/dL (ref 0.3–1.2)
Total Protein: 8 g/dL (ref 6.5–8.1)

## 2021-03-27 LAB — CBC WITH DIFFERENTIAL/PLATELET
Abs Immature Granulocytes: 0.01 10*3/uL (ref 0.00–0.07)
Basophils Absolute: 0 10*3/uL (ref 0.0–0.1)
Basophils Relative: 0 %
Eosinophils Absolute: 0 10*3/uL (ref 0.0–0.5)
Eosinophils Relative: 0 %
HCT: 42.3 % (ref 39.0–52.0)
Hemoglobin: 13 g/dL (ref 13.0–17.0)
Immature Granulocytes: 0 %
Lymphocytes Relative: 15 %
Lymphs Abs: 1.1 10*3/uL (ref 0.7–4.0)
MCH: 23.4 pg — ABNORMAL LOW (ref 26.0–34.0)
MCHC: 30.7 g/dL (ref 30.0–36.0)
MCV: 76.1 fL — ABNORMAL LOW (ref 80.0–100.0)
Monocytes Absolute: 0.3 10*3/uL (ref 0.1–1.0)
Monocytes Relative: 4 %
Neutro Abs: 5.9 10*3/uL (ref 1.7–7.7)
Neutrophils Relative %: 81 %
Platelets: 234 10*3/uL (ref 150–400)
RBC: 5.56 MIL/uL (ref 4.22–5.81)
RDW: 15.7 % — ABNORMAL HIGH (ref 11.5–15.5)
WBC: 7.3 10*3/uL (ref 4.0–10.5)
nRBC: 0 % (ref 0.0–0.2)

## 2021-03-27 LAB — RESP PANEL BY RT-PCR (FLU A&B, COVID) ARPGX2
Influenza A by PCR: NEGATIVE
Influenza B by PCR: NEGATIVE
SARS Coronavirus 2 by RT PCR: NEGATIVE

## 2021-03-27 LAB — TROPONIN I (HIGH SENSITIVITY)
Troponin I (High Sensitivity): 373 ng/L (ref <18)
Troponin I (High Sensitivity): 449 ng/L (ref <18)

## 2021-03-27 LAB — LIPASE, BLOOD: Lipase: 24 U/L (ref 11–51)

## 2021-03-27 MED ORDER — HEPARIN BOLUS VIA INFUSION
4000.0000 [IU] | INTRAVENOUS | Status: AC
  Administered 2021-03-27: 20:00:00 4000 [IU] via INTRAVENOUS
  Filled 2021-03-27: qty 4000

## 2021-03-27 MED ORDER — ONDANSETRON HCL 4 MG/2ML IJ SOLN
4.0000 mg | Freq: Once | INTRAMUSCULAR | Status: AC
  Administered 2021-03-27: 18:00:00 4 mg via INTRAVENOUS
  Filled 2021-03-27: qty 2

## 2021-03-27 MED ORDER — LORAZEPAM 2 MG/ML IJ SOLN
1.0000 mg | Freq: Once | INTRAMUSCULAR | Status: AC
  Administered 2021-03-27: 18:00:00 1 mg via INTRAVENOUS
  Filled 2021-03-27: qty 1

## 2021-03-27 MED ORDER — HEPARIN (PORCINE) 25000 UT/250ML-% IV SOLN
1250.0000 [IU]/h | INTRAVENOUS | Status: DC
  Administered 2021-03-27: 20:00:00 1150 [IU]/h via INTRAVENOUS
  Filled 2021-03-27 (×2): qty 250

## 2021-03-27 MED ORDER — ASPIRIN 81 MG PO CHEW
324.0000 mg | CHEWABLE_TABLET | Freq: Once | ORAL | Status: AC
  Administered 2021-03-27: 20:00:00 324 mg via ORAL
  Filled 2021-03-27: qty 4

## 2021-03-27 MED ORDER — FENTANYL CITRATE PF 50 MCG/ML IJ SOSY
100.0000 ug | PREFILLED_SYRINGE | Freq: Once | INTRAMUSCULAR | Status: AC
  Administered 2021-03-27: 18:00:00 100 ug via INTRAVENOUS
  Filled 2021-03-27: qty 2

## 2021-03-27 MED ORDER — SODIUM CHLORIDE 0.9 % IV BOLUS
1000.0000 mL | Freq: Once | INTRAVENOUS | Status: AC
  Administered 2021-03-27: 18:00:00 1000 mL via INTRAVENOUS

## 2021-03-27 NOTE — ED Triage Notes (Signed)
PT c/o central chest pain and L abdominal pain today with 4 episodes of vomiting.

## 2021-03-27 NOTE — Progress Notes (Signed)
ANTICOAGULATION CONSULT NOTE - Initial Consult  Pharmacy Consult for IV heparin Indication: chest pain/ACS  Allergies  Allergen Reactions   Adhesive [Tape] Other (See Comments)    SKIN SCARRING/IRRITATION. PAPER TAPE IS OKAY    Patient Measurements: Height: 5\' 9"  (175.3 cm) IBW/kg (Calculated) : 70.7 Heparin Dosing Weight: 89.7 kg  Vital Signs: Temp: 99 F (37.2 C) (11/27 1718) BP: 161/90 (11/27 1821) Pulse Rate: 60 (11/27 1821)  Labs: Recent Labs    03/27/21 1726  HGB 13.0  HCT 42.3  PLT 234  CREATININE 1.10  TROPONINIHS 373*    Estimated Creatinine Clearance: 82.3 mL/min (by C-G formula based on SCr of 1.1 mg/dL).   Medical History: Past Medical History:  Diagnosis Date   Allergic rhinitis    Anxiety    Arthritis    "lower back, right thumb, knees" (10/04/2012)   Asthma    "grew out of it" (10/04/2012)   CKD (chronic kidney disease), stage II    Coronary artery disease    a. s/p prior stent to LAD;  b. LHC 6/14: DES to pRCA. c. DES to dCx 01/2017 with residual disease.   Depression    Hyperlipidemia    Hypertension    Ischemic cardiomyopathy    LV (left ventricular) mural thrombus    MI (myocardial infarction) (HCC) 03/2007   OSA (obstructive sleep apnea)    mild   Pneumonia    "as a child" (10/04/2012)   Sinus bradycardia     Assessment: 60 y/oF with PMH of LV mural thrombus on Apixaban who presents to [facility] ED with chest and flank pain. Troponin elevated. Pharmacy consulted for IV heparin dosing for ACS. Last dose of Apixaban 5mg  PO BID reported per patient as 11/26 at 1800. Hgb, Pltc WNL. Note that recent Apixaban use can falsely elevate heparin level.    Goal of Therapy:  Heparin level 0.3-0.7 units/ml aPTT 66-102 seconds Monitor platelets by anticoagulation protocol: Yes   Plan:  Baseline aPTT, heparin level, PT/INR Heparin 4000 units IV bolus x 1, then start heparin infusion at 1150 units/hour Anticipate will need to monitor/titrate heparin  using aPTT until effects of Apixaban on heparin level have diminished aPTT, heparin level 6 hours after initiation Daily CBC, heparin level, aPTT Monitor closely for s/sx of bleeding   , PharmD, BCPS Clinical Pharmacist  03/27/2021,7:36 PM

## 2021-03-27 NOTE — ED Provider Notes (Signed)
" [facility] DEPT Provider Note   CSN: [id number] Arrival date & time: 03/27/21  1714     History Chief Complaint  Patient presents with   Chest Pain   Abdominal Pain    Austin Ingram. is a 60 y.o. male.  HPI He presents for evaluation of chest and flank pain which started after a stressful incident."  He states that since that time the pain has improved but is persistent.  He denies shortness of breath, cough, nausea, vomiting or diaphoresis.  He is concerned that he might be having a cardiac issue.  He came here by private vehicle for evaluation.  There are no other known active modifying factors.    Past Medical History:  Diagnosis Date   Allergic rhinitis    Anxiety    Arthritis    "lower back, right thumb, knees" (10/04/2012)   Asthma    "grew out of it" (10/04/2012)   CKD (chronic kidney disease), stage II    Coronary artery disease    a. s/p prior stent to LAD;  b. LHC 6/14: DES to pRCA. c. DES to dCx 01/2017 with residual disease.   Depression    Hyperlipidemia    Hypertension    Ischemic cardiomyopathy    LV (left ventricular) mural thrombus    MI (myocardial infarction) ([place]) 03/2007   OSA (obstructive sleep apnea)    mild   Pneumonia    "as a child" (10/04/2012)   Sinus bradycardia     Patient Active Problem List   Diagnosis Date Noted   Chest pain 03/06/2017   Chest pain with moderate risk for cardiac etiology    Unstable angina ([place])    Ischemic cardiomyopathy 02/20/2017   Exertional angina ([place]) 10/05/2012   Coronary artery disease 12/19/2010   CIRCADIAN RHYTHM SLEEP DISORDER SHIFT WORK TYPE 03/10/2010   INADEQUATE SLEEP HYGIENE 08/20/2007   OSA (obstructive sleep apnea) 08/20/2007   ALLERGIC RHINITIS 08/20/2007   ASTHMA 08/20/2007   Hyperlipidemia 08/19/2007   Essential hypertension 08/19/2007   MYOCARDIAL INFARCTION 08/19/2007   CORONARY HEART DISEASE 08/19/2007    Past Surgical History:  Procedure Laterality  Date   ARTHROPLASTY Right 1993   'crushed; removed bone fragments" (10/04/2012)   CARDIAC CATHETERIZATION  2010   CORONARY ANGIOPLASTY WITH STENT PLACEMENT  2008; 10/04/2012   "1 + 1" (10/04/2012)   CORONARY STENT INTERVENTION N/A 02/26/2017   Procedure: CORONARY STENT INTERVENTION;  Surgeon: Burnell Blanks, MD;  Location: [facility];  Service: Cardiovascular;  Laterality: N/A;   EXPLORATORY LAPAROTOMY  1990's?   "went in to repair hernia; found fatty tissue instead; no hernia repair" (10/04/2012)   INTRAVASCULAR PRESSURE WIRE/FFR STUDY N/A 02/26/2017   Procedure: INTRAVASCULAR PRESSURE WIRE/FFR STUDY;  Surgeon: Burnell Blanks, MD;  Location: [facility];  Service: Cardiovascular;  Laterality: N/A;   LEFT HEART CATH AND CORONARY ANGIOGRAPHY N/A 02/26/2017   Procedure: LEFT HEART CATH AND CORONARY ANGIOGRAPHY;  Surgeon: Burnell Blanks, MD;  Location: [facility];  Service: Cardiovascular;  Laterality: N/A;   LEFT HEART CATHETERIZATION WITH CORONARY ANGIOGRAM N/A 10/04/2012   Procedure: LEFT HEART CATHETERIZATION WITH CORONARY ANGIOGRAM;  Surgeon: Sherren Mocha, MD;  Location: [facility] C[facility];  Service: Cardiovascular;  Laterality: N/A;       Family History  Problem Relation Age of Onset   Atrial fibrillation Mother        irregular heart beats   Hypertension Mother    Diabetes type  II Mother    CAD Maternal Grandfather    CAD Maternal Grandmother     Social History   Tobacco Use   Smoking status: Former    Packs/day: 0.50    Years: 20.00    Pack years: 10.00    Types: Cigarettes    Quit date: 12/31/1998    Years since quitting: 22.2   Smokeless tobacco: Never  Vaping Use   Vaping Use: Never used  Substance Use Topics   Alcohol use: Yes    Comment: 10/04/2012 "1 beer plus 2 mixed drinks once/month"   Drug use: Yes    Frequency: 14.0 times per week    Types: Marijuana    Comment: smokes 2 joints/day    Home Medications Prior to  Admission medications   Medication Sig Start Date End Date Taking? Authorizing Provider  acetaminophen (TYLENOL) 325 MG tablet Take 1 tablet by mouth every 6 (six) hours as needed. 07/22/14   [provider]  amLODipine (NORVASC) 10 MG tablet Take 10 mg by mouth daily.    [provider]  amoxicillin (AMOXIL) 500 MG capsule TAKE 4 CAPSULES ONE HOUR PRIOR TO [facility] DENTAL APPOINTMENT 12/10/18   [provider]  atorvastatin (LIPITOR) 40 MG tablet Take 1 tablet (40 mg total) by mouth daily. 03/21/21   Larey Dresser, MD  Azelastine HCl 137 MCG/SPRAY SOLN Place 1 spray into both nostrils 2 (two) times daily. As needed 12/23/18   [provider]  carvedilol (COREG) 3.125 MG tablet Take 1 tablet (3.125 mg total) by mouth 2 (two) times daily. 03/21/21 03/21/22  Larey Dresser, MD  citalopram (CELEXA) 40 MG tablet Take 0.5 tablets by mouth daily. 07/22/14   [provider]  dapagliflozin propanediol (FARXIGA) 10 MG TABS tablet Take 1 tablet (10 mg total) by mouth daily before breakfast. 02/03/21   Burnell Blanks, MD  ELIQUIS 5 MG TABS tablet TAKE 1 TABLET BY MOUTH TWICE A DAY 05/10/20   Dunn, Dayna N, [name]  ENTRESTO 97-103 MG TAKE 1 TABLET BY MOUTH TWICE A DAY 04/26/20   Dunn, Dayna N, [name]  finasteride (PROSCAR) 5 MG tablet Take 5 mg by mouth daily. 02/13/17   [provider]  hydrALAZINE (APRESOLINE) 25 MG tablet Take 1 tablet (25 mg total) by mouth 3 (three) times daily. 03/21/21 03/21/22  Larey Dresser, MD  isosorbide mononitrate (IMDUR) 30 MG 24 hr tablet Take 1 tablet (30 mg total) by mouth daily. 02/03/21   Burnell Blanks, MD  nabumetone (RELAFEN) 500 MG tablet Take 500 mg by mouth as needed.    [provider]  nitroGLYCERIN (NITROSTAT) 0.4 MG SL tablet PLACE 1 TABLET UNDER THE TONGUE EVERY 5 MINUTES AS NEEDED FOR CHEST PAIN. Please schedule yearly appointment for future refills. Thank you 07/05/20   Burnell Blanks, MD  spironolactone (ALDACTONE) 25 MG tablet Take 1 tablet (25 mg total) by mouth daily. 09/02/20 03/21/22  Charlie Pitter, [name]  tamsulosin (FLOMAX) 0.4 MG CAPS capsule Take 0.4 mg by mouth daily with breakfast.  01/17/17   [provider]    Allergies    Adhesive [tape]  Review of Systems   Review of Systems  All other systems reviewed and are negative.  Physical Exam Updated Vital Signs BP (!) 146/90   Pulse 80   Temp 99 F (37.2 C)   Resp 20   Ht 5\' 9"  (1.753 m)   SpO2 98%   BMI 30.21 kg/m   Physical  Exam Vitals and nursing note reviewed.  Constitutional:      General: He is not in acute distress.    Appearance: He is well-developed. He is not ill-appearing, toxic-appearing or diaphoretic.  HENT:     Head: Normocephalic and atraumatic.     Right Ear: External ear normal.     Left Ear: External ear normal.  Eyes:     Conjunctiva/sclera: Conjunctivae normal.     Pupils: Pupils are equal, round, and reactive to light.  Neck:     Trachea: Phonation normal.  Cardiovascular:     Rate and Rhythm: Normal rate and regular rhythm.     Heart sounds: Normal heart sounds.  Pulmonary:     Effort: Pulmonary effort is normal. No respiratory distress.     Breath sounds: Normal breath sounds. No stridor.  Chest:     Chest wall: Tenderness present.  Abdominal:     General: There is no distension.     Palpations: Abdomen is soft. There is no mass.     Tenderness: There is abdominal tenderness (Left lateral flank, mild left lateral flank, mild). There is no guarding or rebound.     Hernia: No hernia is present.  Musculoskeletal:        General: Normal range of motion.     Cervical back: Normal range of motion and neck supple.  Skin:    General: Skin is warm and dry.  Neurological:     Mental Status: He is alert and oriented to person, place, and time.     Cranial Nerves: No cranial nerve deficit.     Sensory: No sensory deficit.     Motor: No abnormal muscle tone.      Coordination: Coordination normal.  Psychiatric:        Mood and Affect: Mood normal.        Behavior: Behavior normal.        Thought Content: Thought content normal.        Judgment: Judgment normal.    ED Results / Procedures / Treatments   Labs (all labs ordered are listed, but only abnormal results are displayed) Labs Reviewed  COMPREHENSIVE METABOLIC PANEL - Abnormal; Notable for the following components:      Result Value   Glucose, Bld 128 (*)    All other components within normal limits  CBC WITH DIFFERENTIAL/PLATELET - Abnormal; Notable for the following components:   MCV 76.1 (*)    MCH 23.4 (*)    RDW 15.7 (*)    All other components within normal limits  TROPONIN I (HIGH SENSITIVITY) - Abnormal; Notable for the following components:   Troponin I (High Sensitivity) 373 (*)    All other components within normal limits  TROPONIN I (HIGH SENSITIVITY) - Abnormal; Notable for the following components:   Troponin I (High Sensitivity) 449 (*)    All other components within normal limits  RESP PANEL BY RT-PCR (FLU A&B, COVID) ARPGX2  LIPASE, BLOOD  APTT  PROTIME-INR  HEPARIN LEVEL (UNFRACTIONATED)  HEPARIN LEVEL (UNFRACTIONATED)  APTT  CBC    EKG EKG Interpretation  Date/Time:  Sunday March 27 2021 18:51:08 EST Ventricular Rate:  50 PR Interval:  189 QRS Duration: 105 QT Interval:  463 QTC Calculation: 423 R Axis:   78 Text Interpretation: Sinus rhythm Probable anterolateral infarct, age indeterm Since last tracing at 54 today No significant change was found Confirmed by Daleen Bo [phone]) on 03/27/2021 6:55:45 PM  Radiology DG Chest 2 View  Result Date:  03/27/2021 CLINICAL DATA:  Sternal chest pain and LEFT flank pain, history MI, coronary stenting, hypertension, smoker EXAM: CHEST - 2 VIEW COMPARISON:  03/06/2017 FINDINGS: Borderline enlargement of cardiac silhouette. Mediastinal contours and pulmonary vascularity normal. Lungs clear. No pulmonary  infiltrate, pleural effusion, or pneumothorax. Osseous structures unremarkable. IMPRESSION: No acute abnormalities. Electronically Signed   By: Ulyses Southward M.D.   On: 03/27/2021 19:03    Procedures .Critical Care Performed by: Mancel Bale, MD Authorized by: Mancel Bale, MD   Critical care provider statement:    Critical care time (minutes):  35   Critical care start time:  03/27/2021 5:40 PM   Critical care end time:  03/27/2021 10:44 PM   Critical care time was exclusive of:  Separately billable procedures and treating other patients   Critical care was necessary to treat or prevent imminent or life-threatening deterioration of the following conditions:  Cardiac failure   Critical care was time spent personally by me on the following activities:  Blood draw for specimens, development of treatment plan with patient or surrogate, discussions with consultants, evaluation of patient's response to treatment, examination of patient, ordering and performing treatments and interventions, ordering and review of laboratory studies, ordering and review of radiographic studies, pulse oximetry, re-evaluation of patient's condition and review of old charts   Medications Ordered in ED Medications  heparin bolus via infusion 4,000 Units (4,000 Units Intravenous Bolus from Bag 03/27/21 2029)    Followed by  heparin ADULT infusion 100 units/mL (25000 units/267mL) (1,150 Units/hr Intravenous New Bag/Given 03/27/21 2028)  ondansetron (ZOFRAN) injection 4 mg (4 mg Intravenous Given 03/27/21 1816)  sodium chloride 0.9 % bolus 1,000 mL (1,000 mLs Intravenous New Bag/Given 03/27/21 1820)  fentaNYL (SUBLIMAZE) injection 100 mcg (100 mcg Intravenous Given 03/27/21 1817)  LORazepam (ATIVAN) injection 1 mg (1 mg Intravenous Given 03/27/21 1816)  aspirin chewable tablet 324 mg (324 mg Oral Given 03/27/21 2024)    ED Course  I have reviewed the triage vital signs and the nursing notes.  Pertinent labs &  imaging results that were available during my care of the patient were reviewed by me and considered in my medical decision making (see chart for details).  Clinical Course as of 03/27/21 2244  [name] Mar 27, 2021  2217 Case discussed with cardiologist, Dr.Narcisse.  He recommends overnight observation with cardiac echo in the morning, no need to repeat cardiac enzymes.  He states that the cardiology service will evaluate the patient tomorrow and he recommends hospitalist admit the patient.  He is services available if needed for decompensation. [EW]    Clinical Course User Index [EW] Mancel Bale, MD   MDM Rules/Calculators/A&P                            Patient Vitals for the past 24 hrs:  BP Temp Pulse Resp SpO2 Height  03/27/21 2130 (!) 146/90 -- 80 20 98 % --  03/27/21 2100 (!) 164/99 -- 82 (!) 24 96 % --  03/27/21 2000 (!) 161/105 -- -- (!) 24 96 % --  03/27/21 1930 -- -- -- -- -- 5\' 9"  (1.753 m)  03/27/21 1821 (!) 161/90 -- 60 20 95 % --  03/27/21 1718 (!) 154/89 99 F (37.2 C) (!) 55 10 98 % --    10:44 PM Reevaluation with update and discussion. After initial assessment and treatment, an updated evaluation reveals he remains comfortable, and denies chest pain or shortness of  breath at this time.  He is agreeable to hospitalization for cardiac echo tomorrow morning.  Findings discussed and questions answered. Daleen Bo   Medical Decision Making:  This patient is presenting for evaluation of chest and abdominal pain after an argument, which does require a range of treatment options, and is a complaint that involves a moderate risk of morbidity and mortality. The differential diagnoses include muscle strain, cardiac disorder, pulmonary disorder, occult fracture. I decided to review old records, and in summary middle-aged male presenting with pain after argument.  He has a history of coronary artery disease, unstable angina, myocardial infarction ischemic cardiomyopathy and LV  thrombus.  I did not require additional historical information from anyone.  Clinical Laboratory Tests Ordered, included CBC, Metabolic panel, and lipase, troponin, viral panel . Review indicates initial troponin elevated, marginally increased and delta troponin, mild glucose elevation. Radiologic Tests Ordered, included chest x-ray.  I independently Visualized: Radiograph images, which show no acute abnormalities  Cardiac Monitor Tracing which shows sinus rhythm    Critical Interventions-clinical evaluation, laboratory testing, radiography, aspirin, heparin, consultation with cardiology after first and second troponins, arrangements for admission  After These Interventions, the Patient was reevaluated and was found with improvement of pain after treatment with fentanyl and Ativan.  Chest pain and shortness of breath resolved.  Persistent left flank pain which is nonspecific.  Consider stress cardiomyopathy, complications from prior cardiac disease, stress reaction and nonspecific troponin elevation.  CRITICAL CARE-yes Performed by: Daleen Bo  Nursing Notes Reviewed/ Care Coordinated Applicable Imaging Reviewed Interpretation of Laboratory Data incorporated into ED treatment   10:33 PM-Consult complete with hospitalist. Patient case explained and discussed.  She agrees to admit patient for further evaluation and treatment. Call ended at 2242    Final Clinical Impression(s) / ED Diagnoses Final diagnoses:  Nonspecific chest pain  Troponin level elevated    Rx / DC Orders ED Discharge Orders     None        Daleen Bo, MD 03/27/21 2245

## 2021-03-27 NOTE — H&P (Addendum)
Austin Mattson Jr. [id number] DOB: Aug 31, 1960 DOA: 03/27/2021     PCP: Alroy Dust, L.Marlou Sa, MD   Outpatient Specialists:   CARDS:  Dr.Dr. Angelena Form    Patient arrived to ER on 03/27/21 at 1714 Referred by Attending Daleen Bo, MD   Patient coming from: home Lives With family    Chief Complaint:   Chief Complaint  Patient presents with   Chest Pain   Abdominal Pain    HPI: Austin Ingram. is a 60 y.o. male with medical history significant of  HTN, ischemic cardiomyopathy, CAD, and LV thrombus     Presented with central chest pain and L abdominal pain today with 4 episodes of vomiting 10/10 chest tightness since yesterday took nitor LUQ/rib pain that increases on deep inspiration/palpation Cp started after argument on Friday pain lasted about 3 h got better with nitro and rest Noted a little left abdominal pain Then this AM again some CP 11AM lasted 1 h took nitro After he was done with CP his left abdomen was hurting really bad Also some epigastric pain and nausea, he vomited 4-5 times No diarrhea He had a BM it was pebbles Reports chills no fever Drinks socially  recent  ETOH on Thursday No tobacco    Has  been vaccinated against COVID  and flu shot   Initial COVID TEST  NEGATIVE   Lab Results  Component Value Date   [place] NEGATIVE 03/27/2021    Regarding pertinent Chronic problems:    Hyperlipidemia -  on statins Lipitor Lipid Panel     Component Value Date/Time   CHOL 136 01/31/2021 1102   TRIG 45 01/31/2021 1102   HDL 42 01/31/2021 1102   CHOLHDL 3.2 01/31/2021 1102   CHOLHDL 3.3 03/06/2017 0525   VLDL 17 03/06/2017 0525   LDLCALC 84 01/31/2021 1102   LDLDIRECT 59.9 06/03/2007 1114   LABVLDL 10 01/31/2021 1102     HTN on Norvasc, coreg, hydralazine, imdur   chronic CHF diastolic/systolic/ combined - last echo  systolic CHF and has been noted to have an LV thrombus in the past.  Most recent echo in 4/22 showed EF 30-35%, normal RV.   on  Fraxiga, entresto, spirolonolactone    CAD  - On Aspirin, statin, betablocker, Plavix                 -  followed by cardiology                - last cardiac cath    initial PCI in 2004 to proximal RCA.  In 2008, had had anterior MI with BMS to LAD.  In 10/18, he had DES to distal LCx (RCA noted to be chronically occluded).  - Cardiolite (4/22): EF 31%, peri-apical fixed defect with no ischemia.    obesity-   BMI Readings from Last 1 Encounters:  03/27/21 30.21 kg/m       OSA - mild    LV thrombus on eliquis has been taking it     CKD stage IIIa- baseline Cr 1.1 Estimated Creatinine Clearance: 82.3 mL/min (by C-G formula based on SCr of 1.1 mg/dL).  Lab Results  Component Value Date   CREATININE 1.10 03/27/2021   CREATININE 1.31 (H) 03/21/2021   CREATININE 1.18 01/31/2021      BPH - on  Proscar. flomax     While in ER: Troponin was elevated started on heparin Trop 373 - 449 - 405    ED Triage Vitals  Enc Vitals Group  BP 03/27/21 1718 (!) 154/89     Pulse Rate 03/27/21 1718 (!) 55     Resp 03/27/21 1718 10     Temp 03/27/21 1718 99 F (37.2 C)     Temp src --      SpO2 03/27/21 1718 98 %     Weight --      Height 03/27/21 1930 5\' 9"  (1.753 m)     Head Circumference --      Peak Flow --      Pain Score 03/27/21 1900 Asleep     Pain Loc --      Pain Edu? --      Excl. in GC? --   TMAX(24)@     _________________________________________ Significant initial  Findings: Abnormal Labs Reviewed  COMPREHENSIVE METABOLIC PANEL - Abnormal; Notable for the following components:      Result Value   Glucose, Bld 128 (*)    All other components within normal limits  CBC WITH DIFFERENTIAL/PLATELET - Abnormal; Notable for the following components:   MCV 76.1 (*)    MCH 23.4 (*)    RDW 15.7 (*)    All other components within normal limits  TROPONIN I (HIGH SENSITIVITY) - Abnormal; Notable for the following components:   Troponin I (High Sensitivity) 373 (*)    All  other components within normal limits  TROPONIN I (HIGH SENSITIVITY) - Abnormal; Notable for the following components:   Troponin I (High Sensitivity) 449 (*)    All other components within normal limits   ____________________________________________   CXR -  NON acute  CTabd/pelvis - Ordered   _________________________ Troponin 373 - 449 ECG: Ordered Personally reviewed by me showing: HR : 50 Rhythm:  sinus brady  no evidence of ischemic changes QTC 423   The recent clinical data is shown below. Vitals:   03/27/21 1930 03/27/21 2000 03/27/21 2100 03/27/21 2130  BP:  (!) 161/105 (!) 164/99 (!) 146/90  Pulse:   82 80  Resp:  (!) 24 (!) 24 20  Temp:      SpO2:  96% 96% 98%  Height: 5\' 9"  (1.753 m)       WBC     Component Value Date/Time   WBC 7.3 03/27/2021 1726   LYMPHSABS 1.1 03/27/2021 1726   MONOABS 0.3 03/27/2021 1726   EOSABS 0.0 03/27/2021 1726   BASOSABS 0.0 03/27/2021 1726        UA ordered     Results for orders placed or performed during the hospital encounter of 03/27/21  Resp Panel by RT-PCR (Flu A&B, Covid) Nasopharyngeal Swab     Status: None   Collection Time: 03/27/21  5:26 PM   Specimen: Nasopharyngeal Swab; Nasopharyngeal(NP) swabs in vial transport medium  Result Value Ref Range Status   SARS Coronavirus 2 by RT PCR NEGATIVE NEGATIVE Final         Influenza A by PCR NEGATIVE NEGATIVE Final   Influenza B by PCR NEGATIVE NEGATIVE Final           _______________________________________________________ ER Provider Called:    Cardiology Dr. Hyacinth Meeker They Recommend admit to medicine     _______________________________________________ Hospitalist was called for admission for Chest pain and left abd pain   The following Work up has been ordered so far:  Orders Placed This Encounter  Procedures   Resp Panel by RT-PCR (Flu A&B, Covid) Nasopharyngeal Swab   DG Chest 2 View   Comprehensive metabolic panel   CBC with Differential  Lipase,  blood   APTT   Protime-INR   Heparin level (unfractionated)   Heparin level (unfractionated)   APTT   CBC   Cardiac monitoring   heparin per pharmacy consult   Inpatient consult to Cardiology   Inpatient consult to Cardiology   Consult to hospitalist   ED EKG    Following Medications were ordered in ER: Medications  heparin bolus via infusion 4,000 Units (4,000 Units Intravenous Bolus from Bag 03/27/21 2029)    Followed by  heparin ADULT infusion 100 units/mL (25000 units/275mL) (1,150 Units/hr Intravenous New Bag/Given 03/27/21 2028)  ondansetron (ZOFRAN) injection 4 mg (4 mg Intravenous Given 03/27/21 1816)  sodium chloride 0.9 % bolus 1,000 mL (1,000 mLs Intravenous New Bag/Given 03/27/21 1820)  fentaNYL (SUBLIMAZE) injection 100 mcg (100 mcg Intravenous Given 03/27/21 1817)  LORazepam (ATIVAN) injection 1 mg (1 mg Intravenous Given 03/27/21 1816)  aspirin chewable tablet 324 mg (324 mg Oral Given 03/27/21 2024)        Consult Orders  (From admission, onward)           Start     Ordered   03/27/21 2222  Consult to hospitalist  Once       Provider:  (Not yet assigned)  Question Answer Comment  Place call to: [facility]   Reason for Consult Admit      03/27/21 2221             OTHER Significant initial  Findings:  labs showing:     Recent Labs  Lab 03/21/21 1526 03/27/21 1726  NA 142 139  K 3.9 3.7  CO2 28 25  GLUCOSE 82 128*  BUN 14 16  CREATININE 1.31* 1.10  CALCIUM 8.8* 8.9    Cr   stable,   Lab Results  Component Value Date   CREATININE 1.10 03/27/2021   CREATININE 1.31 (H) 03/21/2021   CREATININE 1.18 01/31/2021    Recent Labs  Lab 03/27/21 1726  AST 28  ALT 18  ALKPHOS 71  BILITOT 0.6  PROT 8.0  ALBUMIN 4.5   Lab Results  Component Value Date   CALCIUM 8.9 03/27/2021    Plt: Lab Results  Component Value Date   PLT 234 03/27/2021     COVID-19 Labs  No results for input(s): DDIMER, FERRITIN, LDH, CRP in the  last 72 hours.  Lab Results  Component Value Date   SARSCOV2NAA NEGATIVE 03/27/2021     Recent Labs  Lab 03/27/21 1726  WBC 7.3  NEUTROABS 5.9  HGB 13.0  HCT 42.3  MCV 76.1*  PLT 234    HG/HCT  stable,     Component Value Date/Time   HGB 13.0 03/27/2021 1726   HGB 13.3 08/12/2020 1028   HCT 42.3 03/27/2021 1726   HCT 43.2 08/12/2020 1028   MCV 76.1 (L) 03/27/2021 1726   MCV 73 (L) 08/12/2020 1028    Recent Labs  Lab 03/27/21 1726  LIPASE 24    Cardiac Panel (last 3 results) No results for input(s): CKTOTAL, CKMB, TROPONINI, RELINDX in the last 72 hours.   BNP (last 3 results) Recent Labs    03/21/21 1526  BNP 125.9*         Cultures: No results found for: SDES, SPECREQUEST, CULT, REPTSTATUS   Radiological Exams on Admission: DG Chest 2 View  Result Date: 03/27/2021 CLINICAL DATA:  Sternal chest pain and LEFT flank pain, history MI, coronary stenting, hypertension, smoker EXAM: CHEST - 2 VIEW COMPARISON:  03/06/2017 FINDINGS:  Borderline enlargement of cardiac silhouette. Mediastinal contours and pulmonary vascularity normal. Lungs clear. No pulmonary infiltrate, pleural effusion, or pneumothorax. Osseous structures unremarkable. IMPRESSION: No acute abnormalities. Electronically Signed   By: Lavonia Dana M.D.   On: 03/27/2021 19:03   CT ABDOMEN PELVIS W CONTRAST  Result Date: 03/28/2021 CLINICAL DATA:  Diverticulitis, complication suspected. EXAM: CT ABDOMEN AND PELVIS WITH CONTRAST TECHNIQUE: Multidetector CT imaging of the abdomen and pelvis was performed using the standard protocol following bolus administration of intravenous contrast. CONTRAST:  24mL OMNIPAQUE IOHEXOL 350 MG/ML SOLN COMPARISON:  12/08/2002. FINDINGS: Lower chest: The heart is normal in size and coronary artery calcifications are noted. Minimal atelectasis is present at the lung bases. Hepatobiliary: A subcentimeter hypodensity is present in the left lobe of the liver which is too small to  further characterize. No biliary ductal dilatation. The gallbladder is without stones. Pancreas: Unremarkable. No pancreatic ductal dilatation or surrounding inflammatory changes. Spleen: A wedge-shaped hypodensity is present in the posterior aspect of the spleen suggesting splenic infarcts of indeterminate age. Calcified granuloma are noted in the spleen. Adrenals/Urinary Tract: No adrenal nodule or mass. No renal calculus or hydronephrosis bilaterally. Hypodensities are present in the left kidney which can not be further characterized on this exam, the largest measuring 1.4 cm. The bladder is unremarkable. Stomach/Bowel: The stomach is unremarkable. No bowel obstruction, free air, or pneumatosis. A normal appendix is seen in the right lower quadrant. A few scattered diverticula are noted along the colon without evidence of diverticulitis. There is fatty infiltration of the walls of the descending colon suggesting chronic inflammatory changes. Vascular/Lymphatic: Aortic atherosclerosis. No enlarged abdominal or pelvic lymph nodes. Reproductive: The prostate gland is normal in size. Other: Fat containing inguinal hernias are present bilaterally. There is a fat containing umbilical hernia. No free fluid. Musculoskeletal: No acute osseous abnormality. IMPRESSION: 1. Diverticulosis without diverticulitis. 2. Wedge shaped hypodensity in the posterior aspect of the spleen, possible infarct of indeterminate age. Findings may also be associated with laceration in the setting of trauma. 3. Coronary artery calcifications. 4. Indeterminate hypodensities in left kidney. Ultrasound is recommended for further evaluation. 5. Remaining chronic findings as described above. Electronically Signed   By: Brett Fairy M.D.   On: 03/28/2021 02:06   _______________________________________________________________________________________________________ Latest   Blood pressure (!) 146/90, pulse 80, temperature 99 F (37.2 C), resp.  rate 20, height 5\' 9"  (1.753 m), SpO2 98 %.   Review of Systems:    Pertinent positives include:  abdominal pain, nausea, vomiting  Constitutional:  No weight loss, night sweats, Fevers, chills, fatigue, weight loss  HEENT:  No headaches, Difficulty swallowing,Tooth/dental problems,Sore throat,  No sneezing, itching, ear ache, nasal congestion, post nasal drip,  Cardio-vascular:  No chest pain, Orthopnea, PND, anasarca, dizziness, palpitations.no Bilateral lower extremity swelling  GI:  No heartburn, indigestion, , diarrhea, change in bowel habits, loss of appetite, melena, blood in stool, hematemesis Resp:  no shortness of breath at rest. No dyspnea on exertion, No excess mucus, no productive cough, No non-productive cough, No coughing up of blood.No change in color of mucus.No wheezing. Skin:  no rash or lesions. No jaundice GU:  no dysuria, change in color of urine, no urgency or frequency. No straining to urinate.  No flank pain.  Musculoskeletal:  No joint pain or no joint swelling. No decreased range of motion. No back pain.  Psych:  No change in mood or affect. No depression or anxiety. No memory loss.  Neuro: no localizing neurological complaints, no  tingling, no weakness, no double vision, no gait abnormality, no slurred speech, no confusion  All systems reviewed and apart from [place] all are negative _______________________________________________________________________________________________ Past Medical History:   Past Medical History:  Diagnosis Date   Allergic rhinitis    Anxiety    Arthritis    "lower back, right thumb, knees" (10/04/2012)   Asthma    "grew out of it" (10/04/2012)   CKD (chronic kidney disease), stage II    Coronary artery disease    a. s/p prior stent to LAD;  b. LHC 6/14: DES to pRCA. c. DES to dCx 01/2017 with residual disease.   Depression    Hyperlipidemia    Hypertension    Ischemic cardiomyopathy    LV (left ventricular) mural thrombus     MI (myocardial infarction) ([place]) 03/2007   OSA (obstructive sleep apnea)    mild   Pneumonia    "as a child" (10/04/2012)   Sinus bradycardia       Past Surgical History:  Procedure Laterality Date   ARTHROPLASTY Right 1993   'crushed; removed bone fragments" (10/04/2012)   CARDIAC CATHETERIZATION  2010   CORONARY ANGIOPLASTY WITH STENT PLACEMENT  2008; 10/04/2012   "1 + 1" (10/04/2012)   CORONARY STENT INTERVENTION N/A 02/26/2017   Procedure: CORONARY STENT INTERVENTION;  Surgeon: Burnell Blanks, MD;  Location: [place] [facility];  Service: Cardiovascular;  Laterality: N/A;   EXPLORATORY LAPAROTOMY  1990's?   "went in to repair hernia; found fatty tissue instead; no hernia repair" (10/04/2012)   INTRAVASCULAR PRESSURE WIRE/FFR STUDY N/A 02/26/2017   Procedure: INTRAVASCULAR PRESSURE WIRE/FFR STUDY;  Surgeon: Burnell Blanks, MD;  Location: [place] [facility];  Service: Cardiovascular;  Laterality: N/A;   LEFT HEART CATH AND CORONARY ANGIOGRAPHY N/A 02/26/2017   Procedure: LEFT HEART CATH AND CORONARY ANGIOGRAPHY;  Surgeon: Burnell Blanks, MD;  Location: [place] [facility];  Service: Cardiovascular;  Laterality: N/A;   LEFT HEART CATHETERIZATION WITH CORONARY ANGIOGRAM N/A 10/04/2012   Procedure: LEFT HEART CATHETERIZATION WITH CORONARY ANGIOGRAM;  Surgeon: Sherren Mocha, MD;  Location: [facility] CATH LAB;  Service: Cardiovascular;  Laterality: N/A;    Social History:  Ambulatory   independently      reports that he quit smoking about 22 years ago. His smoking use included cigarettes. He has a 10.00 pack-year smoking history. He has never used smokeless tobacco. He reports current alcohol use. He reports current drug use. Frequency: 14.00 times per week. Drug: Marijuana.   Family History:   Family History  Problem Relation Age of Onset   Atrial fibrillation Mother        irregular heart beats   Hypertension Mother    Diabetes type II Mother    CAD Maternal  Grandfather    CAD Maternal Grandmother    ______________________________________________________________________________________________ Allergies: Allergies  Allergen Reactions   Adhesive [Tape] Other (See Comments)    SKIN SCARRING/IRRITATION. PAPER TAPE IS OKAY     Prior to Admission medications   Medication Sig Start Date End Date Taking? Authorizing Provider  acetaminophen (TYLENOL) 325 MG tablet Take 1 tablet by mouth every 6 (six) hours as needed. 07/22/14   [provider]  amLODipine (NORVASC) 10 MG tablet Take 10 mg by mouth daily.    [provider]  amoxicillin (AMOXIL) 500 MG capsule TAKE 4 CAPSULES ONE HOUR PRIOR TO [facility] DENTAL APPOINTMENT 12/10/18   [provider]  atorvastatin (LIPITOR) 40 MG tablet Take 1 tablet (40 mg total) by  mouth daily. 03/21/21   Laurey Morale, MD  Azelastine HCl 137 MCG/SPRAY SOLN Place 1 spray into both nostrils 2 (two) times daily. As needed 12/23/18   [provider]  carvedilol (COREG) 3.125 MG tablet Take 1 tablet (3.125 mg total) by mouth 2 (two) times daily. 03/21/21 03/21/22  Laurey Morale, MD  citalopram (CELEXA) 40 MG tablet Take 0.5 tablets by mouth daily. 07/22/14   [provider]  dapagliflozin propanediol (FARXIGA) 10 MG TABS tablet Take 1 tablet (10 mg total) by mouth daily before breakfast. 02/03/21   Kathleene Hazel, MD  ELIQUIS 5 MG TABS tablet TAKE 1 TABLET BY MOUTH TWICE A DAY 05/10/20   Dunn, Dayna N, [name]  ENTRESTO 97-103 MG TAKE 1 TABLET BY MOUTH TWICE A DAY 04/26/20   Dunn, Dayna N, [name]  finasteride (PROSCAR) 5 MG tablet Take 5 mg by mouth daily. 02/13/17   [provider]  hydrALAZINE (APRESOLINE) 25 MG tablet Take 1 tablet (25 mg total) by mouth 3 (three) times daily. 03/21/21 03/21/22  Laurey Morale, MD  isosorbide mononitrate (IMDUR) 30 MG 24 hr tablet Take 1 tablet (30 mg total) by mouth daily. 02/03/21   Kathleene Hazel, MD  nabumetone (RELAFEN)  500 MG tablet Take 500 mg by mouth as needed.    [provider]  nitroGLYCERIN (NITROSTAT) 0.4 MG SL tablet PLACE 1 TABLET UNDER THE TONGUE EVERY 5 MINUTES AS NEEDED FOR CHEST PAIN. Please schedule yearly appointment for future refills. Thank you 07/05/20   Kathleene Hazel, MD  spironolactone (ALDACTONE) 25 MG tablet Take 1 tablet (25 mg total) by mouth daily. 09/02/20 03/21/22  Laurann Montana, [name]  tamsulosin (FLOMAX) 0.4 MG CAPS capsule Take 0.4 mg by mouth daily with breakfast.  01/17/17   [provider]    ___________________________________________________________________________________________________ Physical Exam: Vitals with BMI 03/27/2021 03/27/2021 03/27/2021  Height - - -  Weight - - -  BMI - - -  Systolic 146 164 270  Diastolic 90 99 105  Pulse 80 82 -     1. General:  in No  Acute distress    Chronically ill   -appearing 2. Psychological: Alert and  Oriented 3. Head/ENT:  Dry Mucous Membranes                          Head Non traumatic, neck supple                        Poor Dentition 4. SKIN decreased Skin turgor,  Skin clean Dry and intact no rash 5. Heart: Regular rate and rhythm no  Murmur, no Rub or gallop 6. Lungs:  Clear to auscultation bilaterally, no wheezes or crackles   7. Abdomen: Soft,  LLQ tender,   distended   obese  some guarding 8. Lower extremities: no clubbing, cyanosis, no  edema 9. Neurologically Grossly intact, moving all 4 extremities equally   10. MSK: Normal range of motion    Chart has been reviewed  ______________________________________________________________________________________________  Assessment/Plan  60 y.o. male with medical history significant of  HTN, ischemic cardiomyopathy, CAD, and LV thrombus     Admitted for Chest pain and LLQ abd pain  Present on Admission:  NSTEMI (non-ST elevated myocardial infarction) (HCC) vs elevated troponin -troponin elevated but stable obtain echogram given initial  chest pain started on heparin drip.  Cardiology is aware appreciate their input.  Check lipid panel  and TSH   Essential hypertension - continue home medications   Coronary artery disease - on aspirin, statin not on BB due to bradycardia   Chest pain with moderate risk for cardiac etiology - - H=  2  ,E=0  , A=  2   , R  2  , T  1    for the  Total of  7  therefore will admit for observation and further evaluation ( Risk of MACE: Scores 0-3  of 0.9-1.7%.,  4-6: 12-16.6% , Scores ?7: 50-65% )  On chronic anticoagulaiton and compliant pain not consistent with PE - troponin elevated but stable [facility] to [facility] with [facility]ology  -No acute ischemic changes on ECG     - monitor on telemetry, cycle cardiac enzymes, obtain serial ECG and  ECHO in AM.   - Daily aspirin -  Further risk stratify with lipid panel, hgA1C, obtain TSH. Further management depends on pending  workup  LV thrombus on - eliquis at baseline now on heparin   LEft lower quadrant pain - with some guarding, keep NPO for now and obtain CT abd, check ua CT showed A wedge-shaped hypodensity is present in the posterior aspect of the spleen suggesting splenic infarcts of indeterminate age. Pt with hx of LV thrombus already on anticoagulation, echo in AM supportive measures Denies any recent  trauma Also Indeterminate hypodensities in left kidney. Will order renal US Constipation will treat Other plan as per orders.  DVT prophylaxis: heparin     Code Status:    Code Status: Prior FULL CODE  as per patient  I had personally discussed CODE STATUS with patient      Family Communication:   Family not at  Bedside    Disposition Plan:        To home once workup is complete and patient is stable   Following barriers for discharge:                            Electrolytes corrected                               Cp worked up                             Pain controlled with PO medications                                                             Will need to be able to tolerate PO                            Will likely need home health, home O2, set up                           Will need consultants to evaluate patient prior to discharge    Consults called: cardiology  Admission status:  ED Disposition     ED Disposition  Admit   Condition  --   Comment  Hospital [facility] [[id number]]  Level of Care:  Progressive [102]  Admit to Progressive based on following criteria: CARDIOVASCULAR & THORACIC of moderate stability with acute coronary syndrome symptoms/low risk myocardial infarction/hypertensive urgency/arrhythmias/heart failure potentially compromising stability and stable post cardiovascular intervention patients.  May place patient in observation at [facility] or [place] if equivalent level of care is available:: No  Covid Evaluation: Asymptomatic Screening Protocol (No Symptoms)  Diagnosis: [facility]STEMI (non-ST elevated myocardial infarction) [id number]  Admitting Physician: Toy Baker [[id number]]  Attending Physician: Toy Baker [[id number]]           Obs    Level of care    progressive  tele indefinitely please discontinue once patient no longer qualifies COVID-19 Labs    Lab Results  Component Value Date   [place] 03/27/2021     Precautions: admitted as  Covid Negative  PPE: Used by the provider:   N95  eye Goggles,  Gloves     Dudley Mages 03/28/2021 , 2:02 AM    [facility]     after 2 AM please page floor coverage PA If 7AM-7PM, please contact the day team taking care of the patient using [email or web address]   Patient was evaluated in the context of the global COVID-19 pandemic, which necessitated consideration that the patient might be at risk for infection with the SARS-CoV-2 virus that causes COVID-19. Institutional protocols and algorithms that pertain to the evaluation of patients at risk for COVID-19 are in a state of rapid  change based on information released by regulatory bodies including the CDC and federal and state organizations. These policies and algorithms were followed during the patient's care.

## 2021-03-27 NOTE — ED Notes (Signed)
Pt NAD being wheeled back to room in w/c. Pt a/ox4, states he developed 10/10 chest tightness yesterday and took 3 NTG which he is prescribed with relief to 6/10. Pt also c/o subjective SOB while speaking and LUQ/rib pain that increases on deep inspiration/palpation. Previous MI HX. States similar pain. LS clear. -edema noted

## 2021-03-28 ENCOUNTER — Observation Stay (HOSPITAL_COMMUNITY): Payer: Federal, State, Local not specified - PPO

## 2021-03-28 ENCOUNTER — Encounter (HOSPITAL_COMMUNITY): Payer: Self-pay | Admitting: Internal Medicine

## 2021-03-28 DIAGNOSIS — N400 Enlarged prostate without lower urinary tract symptoms: Secondary | ICD-10-CM | POA: Diagnosis not present

## 2021-03-28 DIAGNOSIS — I214 Non-ST elevation (NSTEMI) myocardial infarction: Secondary | ICD-10-CM

## 2021-03-28 DIAGNOSIS — I10 Essential (primary) hypertension: Secondary | ICD-10-CM | POA: Diagnosis not present

## 2021-03-28 DIAGNOSIS — Z79899 Other long term (current) drug therapy: Secondary | ICD-10-CM | POA: Diagnosis not present

## 2021-03-28 DIAGNOSIS — I25118 Atherosclerotic heart disease of native coronary artery with other forms of angina pectoris: Secondary | ICD-10-CM

## 2021-03-28 DIAGNOSIS — I5022 Chronic systolic (congestive) heart failure: Secondary | ICD-10-CM

## 2021-03-28 DIAGNOSIS — I130 Hypertensive heart and chronic kidney disease with heart failure and stage 1 through stage 4 chronic kidney disease, or unspecified chronic kidney disease: Secondary | ICD-10-CM | POA: Diagnosis not present

## 2021-03-28 DIAGNOSIS — G4733 Obstructive sleep apnea (adult) (pediatric): Secondary | ICD-10-CM | POA: Diagnosis not present

## 2021-03-28 DIAGNOSIS — F419 Anxiety disorder, unspecified: Secondary | ICD-10-CM | POA: Diagnosis not present

## 2021-03-28 DIAGNOSIS — R935 Abnormal findings on diagnostic imaging of other abdominal regions, including retroperitoneum: Secondary | ICD-10-CM | POA: Diagnosis not present

## 2021-03-28 DIAGNOSIS — I513 Intracardiac thrombosis, not elsewhere classified: Secondary | ICD-10-CM | POA: Diagnosis not present

## 2021-03-28 DIAGNOSIS — I5042 Chronic combined systolic (congestive) and diastolic (congestive) heart failure: Secondary | ICD-10-CM

## 2021-03-28 DIAGNOSIS — D7389 Other diseases of spleen: Secondary | ICD-10-CM | POA: Diagnosis not present

## 2021-03-28 DIAGNOSIS — R93429 Abnormal radiologic findings on diagnostic imaging of unspecified kidney: Secondary | ICD-10-CM

## 2021-03-28 DIAGNOSIS — R079 Chest pain, unspecified: Secondary | ICD-10-CM | POA: Diagnosis not present

## 2021-03-28 DIAGNOSIS — J309 Allergic rhinitis, unspecified: Secondary | ICD-10-CM | POA: Diagnosis not present

## 2021-03-28 DIAGNOSIS — D735 Infarction of spleen: Secondary | ICD-10-CM | POA: Diagnosis not present

## 2021-03-28 DIAGNOSIS — K429 Umbilical hernia without obstruction or gangrene: Secondary | ICD-10-CM | POA: Diagnosis not present

## 2021-03-28 DIAGNOSIS — Z955 Presence of coronary angioplasty implant and graft: Secondary | ICD-10-CM | POA: Diagnosis not present

## 2021-03-28 DIAGNOSIS — R1032 Left lower quadrant pain: Secondary | ICD-10-CM | POA: Diagnosis not present

## 2021-03-28 DIAGNOSIS — Z91048 Other nonmedicinal substance allergy status: Secondary | ICD-10-CM | POA: Diagnosis not present

## 2021-03-28 DIAGNOSIS — Z7901 Long term (current) use of anticoagulants: Secondary | ICD-10-CM | POA: Diagnosis not present

## 2021-03-28 DIAGNOSIS — I2510 Atherosclerotic heart disease of native coronary artery without angina pectoris: Secondary | ICD-10-CM | POA: Diagnosis not present

## 2021-03-28 DIAGNOSIS — E785 Hyperlipidemia, unspecified: Secondary | ICD-10-CM | POA: Diagnosis not present

## 2021-03-28 DIAGNOSIS — M179 Osteoarthritis of knee, unspecified: Secondary | ICD-10-CM | POA: Diagnosis present

## 2021-03-28 DIAGNOSIS — M47819 Spondylosis without myelopathy or radiculopathy, site unspecified: Secondary | ICD-10-CM | POA: Diagnosis not present

## 2021-03-28 DIAGNOSIS — Q631 Lobulated, fused and horseshoe kidney: Secondary | ICD-10-CM | POA: Diagnosis not present

## 2021-03-28 DIAGNOSIS — K5792 Diverticulitis of intestine, part unspecified, without perforation or abscess without bleeding: Secondary | ICD-10-CM | POA: Diagnosis not present

## 2021-03-28 DIAGNOSIS — M19041 Primary osteoarthritis, right hand: Secondary | ICD-10-CM | POA: Diagnosis present

## 2021-03-28 DIAGNOSIS — Z6828 Body mass index (BMI) 28.0-28.9, adult: Secondary | ICD-10-CM | POA: Diagnosis not present

## 2021-03-28 DIAGNOSIS — Z20822 Contact with and (suspected) exposure to covid-19: Secondary | ICD-10-CM | POA: Diagnosis not present

## 2021-03-28 DIAGNOSIS — K4020 Bilateral inguinal hernia, without obstruction or gangrene, not specified as recurrent: Secondary | ICD-10-CM | POA: Diagnosis not present

## 2021-03-28 DIAGNOSIS — Z7982 Long term (current) use of aspirin: Secondary | ICD-10-CM | POA: Diagnosis not present

## 2021-03-28 DIAGNOSIS — N182 Chronic kidney disease, stage 2 (mild): Secondary | ICD-10-CM | POA: Diagnosis not present

## 2021-03-28 DIAGNOSIS — I255 Ischemic cardiomyopathy: Secondary | ICD-10-CM | POA: Diagnosis not present

## 2021-03-28 DIAGNOSIS — S3600XA Unspecified injury of spleen, initial encounter: Secondary | ICD-10-CM

## 2021-03-28 DIAGNOSIS — F32A Depression, unspecified: Secondary | ICD-10-CM | POA: Diagnosis not present

## 2021-03-28 DIAGNOSIS — I252 Old myocardial infarction: Secondary | ICD-10-CM | POA: Diagnosis not present

## 2021-03-28 LAB — URINALYSIS, ROUTINE W REFLEX MICROSCOPIC
Bacteria, UA: NONE SEEN
Bilirubin Urine: NEGATIVE
Glucose, UA: >=500 mg/dL — AB
Hgb urine dipstick: NEGATIVE
Ketones, ur: 20 mg/dL — AB
Leukocytes,Ua: NEGATIVE
Nitrite: NEGATIVE
Protein, ur: NEGATIVE mg/dL
Specific Gravity, Urine: >1.046 — ABNORMAL HIGH (ref 1.005–1.030)
pH: 5 (ref 5.0–8.0)

## 2021-03-28 LAB — CBC WITH DIFFERENTIAL/PLATELET
Abs Immature Granulocytes: 0.02 10*3/uL (ref 0.00–0.07)
Basophils Absolute: 0 10*3/uL (ref 0.0–0.1)
Basophils Relative: 0 %
Eosinophils Absolute: 0 10*3/uL (ref 0.0–0.5)
Eosinophils Relative: 1 %
HCT: 38 % — ABNORMAL LOW (ref 39.0–52.0)
Hemoglobin: 11.9 g/dL — ABNORMAL LOW (ref 13.0–17.0)
Immature Granulocytes: 0 %
Lymphocytes Relative: 29 %
Lymphs Abs: 2.2 10*3/uL (ref 0.7–4.0)
MCH: 23.6 pg — ABNORMAL LOW (ref 26.0–34.0)
MCHC: 31.3 g/dL (ref 30.0–36.0)
MCV: 75.2 fL — ABNORMAL LOW (ref 80.0–100.0)
Monocytes Absolute: 0.6 10*3/uL (ref 0.1–1.0)
Monocytes Relative: 8 %
Neutro Abs: 4.9 10*3/uL (ref 1.7–7.7)
Neutrophils Relative %: 62 %
Platelets: 197 10*3/uL (ref 150–400)
RBC: 5.05 MIL/uL (ref 4.22–5.81)
RDW: 15.8 % — ABNORMAL HIGH (ref 11.5–15.5)
WBC: 7.8 10*3/uL (ref 4.0–10.5)
nRBC: 0 % (ref 0.0–0.2)

## 2021-03-28 LAB — COMPREHENSIVE METABOLIC PANEL
ALT: 16 U/L (ref 0–44)
AST: 24 U/L (ref 15–41)
Albumin: 3.7 g/dL (ref 3.5–5.0)
Alkaline Phosphatase: 60 U/L (ref 38–126)
Anion gap: 7 (ref 5–15)
BUN: 13 mg/dL (ref 6–20)
CO2: 24 mmol/L (ref 22–32)
Calcium: 8.5 mg/dL — ABNORMAL LOW (ref 8.9–10.3)
Chloride: 108 mmol/L (ref 98–111)
Creatinine, Ser: 0.95 mg/dL (ref 0.61–1.24)
GFR, Estimated: >60 mL/min (ref >60)
Glucose, Bld: 90 mg/dL (ref 70–99)
Potassium: 3.3 mmol/L — ABNORMAL LOW (ref 3.5–5.1)
Sodium: 139 mmol/L (ref 135–145)
Total Bilirubin: 0.7 mg/dL (ref 0.3–1.2)
Total Protein: 6.5 g/dL (ref 6.5–8.1)

## 2021-03-28 LAB — LIPID PANEL
Cholesterol: 110 mg/dL (ref 0–200)
HDL: 40 mg/dL — ABNORMAL LOW (ref >40)
LDL Cholesterol: 65 mg/dL (ref 0–99)
Total CHOL/HDL Ratio: 2.8 RATIO
Triglycerides: 26 mg/dL (ref <150)
VLDL: 5 mg/dL (ref 0–40)

## 2021-03-28 LAB — TROPONIN I (HIGH SENSITIVITY)
Troponin I (High Sensitivity): 405 ng/L (ref <18)
Troponin I (High Sensitivity): 494 ng/L (ref <18)
Troponin I (High Sensitivity): 530 ng/L (ref <18)
Troponin I (High Sensitivity): 345 ng/L (ref <18)

## 2021-03-28 LAB — CBC
HCT: 40.8 % (ref 39.0–52.0)
Hemoglobin: 12.7 g/dL — ABNORMAL LOW (ref 13.0–17.0)
MCH: 23.1 pg — ABNORMAL LOW (ref 26.0–34.0)
MCHC: 31.1 g/dL (ref 30.0–36.0)
MCV: 74.2 fL — ABNORMAL LOW (ref 80.0–100.0)
Platelets: 175 10*3/uL (ref 150–400)
RBC: 5.5 MIL/uL (ref 4.22–5.81)
RDW: 15.5 % (ref 11.5–15.5)
WBC: 7.1 10*3/uL (ref 4.0–10.5)
nRBC: 0 % (ref 0.0–0.2)

## 2021-03-28 LAB — RAPID URINE DRUG SCREEN, HOSP PERFORMED
Amphetamines: NOT DETECTED
Barbiturates: NOT DETECTED
Benzodiazepines: NOT DETECTED
Cocaine: NOT DETECTED
Opiates: NOT DETECTED
Tetrahydrocannabinol: POSITIVE — AB

## 2021-03-28 LAB — APTT
aPTT: 28 seconds (ref 24–36)
aPTT: 82 seconds — ABNORMAL HIGH (ref 24–36)

## 2021-03-28 LAB — LACTIC ACID, PLASMA: Lactic Acid, Venous: 0.9 mmol/L (ref 0.5–1.9)

## 2021-03-28 LAB — HEPARIN LEVEL (UNFRACTIONATED)
Heparin Unfractionated: <0.1 IU/mL — ABNORMAL LOW (ref 0.30–0.70)
Heparin Unfractionated: 0.48 IU/mL (ref 0.30–0.70)
Heparin Unfractionated: 0.34 IU/mL (ref 0.30–0.70)

## 2021-03-28 LAB — MAGNESIUM: Magnesium: 2 mg/dL (ref 1.7–2.4)

## 2021-03-28 LAB — PROTIME-INR
INR: 1.1 (ref 0.8–1.2)
Prothrombin Time: 14 seconds (ref 11.4–15.2)

## 2021-03-28 LAB — PHOSPHORUS: Phosphorus: 3.1 mg/dL (ref 2.5–4.6)

## 2021-03-28 LAB — TSH: TSH: 0.472 u[IU]/mL (ref 0.350–4.500)

## 2021-03-28 LAB — HIV ANTIBODY (ROUTINE TESTING W REFLEX): HIV Screen 4th Generation wRfx: NONREACTIVE

## 2021-03-28 MED ORDER — TAMSULOSIN HCL 0.4 MG PO CAPS
0.4000 mg | ORAL_CAPSULE | Freq: Every day | ORAL | Status: DC
  Administered 2021-03-28: 10:00:00 0.4 mg via ORAL
  Filled 2021-03-28: qty 1

## 2021-03-28 MED ORDER — SACUBITRIL-VALSARTAN 97-103 MG PO TABS
1.0000 | ORAL_TABLET | Freq: Two times a day (BID) | ORAL | Status: DC
  Administered 2021-03-28 – 2021-03-29 (×3): 1 via ORAL
  Filled 2021-03-28 (×4): qty 1

## 2021-03-28 MED ORDER — SODIUM CHLORIDE 0.9 % IV SOLN: 250.0000 mL | INTRAVENOUS | Status: DC | PRN

## 2021-03-28 MED ORDER — MORPHINE SULFATE (PF) 2 MG/ML IV SOLN: 2.0000 mg | INTRAVENOUS | Status: DC | PRN

## 2021-03-28 MED ORDER — ISOSORBIDE MONONITRATE ER 60 MG PO TB24
60.0000 mg | ORAL_TABLET | Freq: Every day | ORAL | Status: DC
  Administered 2021-03-29: 60 mg via ORAL
  Filled 2021-03-28 (×2): qty 1

## 2021-03-28 MED ORDER — ASPIRIN 81 MG PO CHEW
81.0000 mg | CHEWABLE_TABLET | ORAL | Status: AC
  Administered 2021-03-29: 81 mg via ORAL
  Filled 2021-03-28 (×2): qty 1

## 2021-03-28 MED ORDER — HYDROCODONE-ACETAMINOPHEN 5-325 MG PO TABS: 1.0000 | ORAL_TABLET | ORAL | Status: DC | PRN

## 2021-03-28 MED ORDER — ALBUTEROL SULFATE HFA 108 (90 BASE) MCG/ACT IN AERS
2.0000 | INHALATION_SPRAY | RESPIRATORY_TRACT | Status: DC | PRN
  Filled 2021-03-28: qty 6.7

## 2021-03-28 MED ORDER — ASPIRIN EC 81 MG PO TBEC
81.0000 mg | DELAYED_RELEASE_TABLET | Freq: Every day | ORAL | Status: DC
  Filled 2021-03-28 (×3): qty 1

## 2021-03-28 MED ORDER — POLYETHYLENE GLYCOL 3350 17 G PO PACK
17.0000 g | PACK | Freq: Every day | ORAL | Status: DC
Start: 2021-03-28 — End: 2021-03-30
  Administered 2021-03-28 – 2021-03-29 (×2): 17 g via ORAL
  Filled 2021-03-28 (×2): qty 1

## 2021-03-28 MED ORDER — SODIUM CHLORIDE 0.9% FLUSH: 3.0000 mL | INTRAVENOUS | Status: DC | PRN

## 2021-03-28 MED ORDER — ACETAMINOPHEN 325 MG PO TABS: 650.0000 mg | ORAL_TABLET | Freq: Four times a day (QID) | ORAL | Status: DC | PRN

## 2021-03-28 MED ORDER — ATORVASTATIN CALCIUM 40 MG PO TABS
40.0000 mg | ORAL_TABLET | Freq: Every day | ORAL | Status: DC
  Administered 2021-03-29: 40 mg via ORAL
  Filled 2021-03-28 (×2): qty 1

## 2021-03-28 MED ORDER — FENTANYL CITRATE PF 50 MCG/ML IJ SOSY: 50.0000 ug | PREFILLED_SYRINGE | INTRAMUSCULAR | Status: DC | PRN

## 2021-03-28 MED ORDER — SPIRONOLACTONE 25 MG PO TABS
25.0000 mg | ORAL_TABLET | Freq: Every day | ORAL | Status: DC
  Administered 2021-03-28 – 2021-03-29 (×2): 25 mg via ORAL
  Filled 2021-03-28 (×2): qty 1

## 2021-03-28 MED ORDER — ISOSORBIDE MONONITRATE ER 30 MG PO TB24
30.0000 mg | ORAL_TABLET | Freq: Once | ORAL | Status: AC
  Administered 2021-03-28: 12:00:00 30 mg via ORAL
  Filled 2021-03-28: qty 1

## 2021-03-28 MED ORDER — ONDANSETRON HCL 4 MG/2ML IJ SOLN: 4.0000 mg | Freq: Four times a day (QID) | INTRAMUSCULAR | Status: DC | PRN

## 2021-03-28 MED ORDER — SODIUM CHLORIDE 0.9% FLUSH
3.0000 mL | Freq: Two times a day (BID) | INTRAVENOUS | Status: DC
  Administered 2021-03-28 – 2021-03-29 (×2): 3 mL via INTRAVENOUS

## 2021-03-28 MED ORDER — IOHEXOL 350 MG/ML SOLN
80.0000 mL | Freq: Once | INTRAVENOUS | Status: AC | PRN
  Administered 2021-03-28: 02:00:00 80 mL via INTRAVENOUS

## 2021-03-28 MED ORDER — CITALOPRAM HYDROBROMIDE 20 MG PO TABS
20.0000 mg | ORAL_TABLET | Freq: Every day | ORAL | Status: DC
  Administered 2021-03-29: 20 mg via ORAL
  Filled 2021-03-28 (×2): qty 1

## 2021-03-28 MED ORDER — FINASTERIDE 5 MG PO TABS
5.0000 mg | ORAL_TABLET | Freq: Every day | ORAL | Status: DC
  Administered 2021-03-28 – 2021-03-29 (×2): 5 mg via ORAL
  Filled 2021-03-28 (×3): qty 1

## 2021-03-28 MED ORDER — SODIUM CHLORIDE 0.9% FLUSH
3.0000 mL | Freq: Two times a day (BID) | INTRAVENOUS | Status: DC
  Administered 2021-03-29: 3 mL via INTRAVENOUS

## 2021-03-28 MED ORDER — ONDANSETRON HCL 4 MG PO TABS: 4.0000 mg | ORAL_TABLET | Freq: Four times a day (QID) | ORAL | Status: DC | PRN

## 2021-03-28 MED ORDER — TAMSULOSIN HCL 0.4 MG PO CAPS
0.4000 mg | ORAL_CAPSULE | Freq: Every day | ORAL | Status: DC
  Administered 2021-03-29: 0.4 mg via ORAL
  Filled 2021-03-28 (×2): qty 1

## 2021-03-28 MED ORDER — CITALOPRAM HYDROBROMIDE 20 MG PO TABS
20.0000 mg | ORAL_TABLET | Freq: Every day | ORAL | Status: DC
  Administered 2021-03-28: 10:00:00 20 mg via ORAL
  Filled 2021-03-28: qty 2

## 2021-03-28 MED ORDER — ACETAMINOPHEN 650 MG RE SUPP: 650.0000 mg | Freq: Four times a day (QID) | RECTAL | Status: DC | PRN

## 2021-03-28 MED ORDER — ISOSORBIDE MONONITRATE ER 30 MG PO TB24
30.0000 mg | ORAL_TABLET | Freq: Every day | ORAL | Status: DC
  Administered 2021-03-28: 10:00:00 30 mg via ORAL
  Filled 2021-03-28: qty 1

## 2021-03-28 MED ORDER — SODIUM CHLORIDE 0.9 % IV SOLN: INTRAVENOUS | Status: DC

## 2021-03-28 MED ORDER — ATORVASTATIN CALCIUM 40 MG PO TABS
40.0000 mg | ORAL_TABLET | Freq: Every day | ORAL | Status: DC
  Administered 2021-03-28: 10:00:00 40 mg via ORAL
  Filled 2021-03-28: qty 1

## 2021-03-28 MED ORDER — ALBUTEROL SULFATE (2.5 MG/3ML) 0.083% IN NEBU: 2.5000 mg | INHALATION_SOLUTION | RESPIRATORY_TRACT | Status: DC | PRN

## 2021-03-28 MED ORDER — AMLODIPINE BESYLATE 10 MG PO TABS
10.0000 mg | ORAL_TABLET | Freq: Every day | ORAL | Status: DC
  Administered 2021-03-29: 10 mg via ORAL
  Filled 2021-03-28 (×2): qty 1

## 2021-03-28 MED ORDER — PERFLUTREN LIPID MICROSPHERE
1.0000 mL | INTRAVENOUS | Status: AC | PRN
Start: 2021-03-28 — End: 2021-03-28
  Administered 2021-03-28: 14:00:00 2 mL via INTRAVENOUS
  Filled 2021-03-28: qty 10

## 2021-03-28 MED ORDER — AMLODIPINE BESYLATE 10 MG PO TABS
10.0000 mg | ORAL_TABLET | Freq: Every day | ORAL | Status: DC
  Administered 2021-03-28: 10:00:00 10 mg via ORAL
  Filled 2021-03-28: qty 2

## 2021-03-28 NOTE — Assessment & Plan Note (Signed)
Imaging significant for evidence of chronic descending colonic inflammation. Unsure if this is contributing to pain. Patient also with low caliber bowel movements; denies constipation issues but stool described as small balls this admission. Abdominal pain has significantly improved. Patient reports no history of a colonoscopy. He states he has tried in the past, but this has been postponed secondary to cardiac issues. Recommended patient to follow-up with [facility] (primary physician is [place]) as an outpatient.

## 2021-03-28 NOTE — Assessment & Plan Note (Signed)
-  Continue Lipitor/aspirin

## 2021-03-28 NOTE — Plan of Care (Signed)
  Problem: Education: Goal: Ability to demonstrate management of disease process will improve Outcome: Progressing Goal: Ability to verbalize understanding of medication therapies will improve Outcome: Progressing Goal: Individualized Educational Video(s) Outcome: Progressing   Problem: Cardiac: Goal: Ability to achieve and maintain adequate cardiopulmonary perfusion will improve Outcome: Progressing   
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
## 2021-03-28 NOTE — Assessment & Plan Note (Addendum)
Stable. EF of 30% -Continue spironolactone, Austin Ingram

## 2021-03-28 NOTE — Assessment & Plan Note (Signed)
Patient is on Eliquis as an outpatient. Eliquis transitioned to Heparin IV while inpatient and during evaluation/management of NSTEMI.

## 2021-03-28 NOTE — Progress Notes (Signed)
[place] for IV heparin Indication: chest pain/ACS  Allergies  Allergen Reactions   Adhesive [Tape] Other (See Comments)    SKIN SCARRING/IRRITATION. PAPER TAPE IS OKAY    Patient Measurements: Height: 5\' 9"  (175.3 cm) Weight: 92.5 kg (204 lb) IBW/kg (Calculated) : 70.7 Heparin Dosing Weight: 89.7 kg  Vital Signs: Temp: 98.7 F (37.1 C) (11/28 0533) Temp Source: Oral (11/28 0533) BP: 166/106 (11/28 0900) Pulse Rate: 56 (11/28 0900)  Labs: Recent Labs    03/27/21 1726 03/27/21 1945 03/27/21 2334 03/28/21 0246 03/28/21 0247 03/28/21 0455 03/28/21 0914  HGB 13.0  --   --   --   --  11.9*  --   HCT 42.3  --   --   --   --  38.0*  --   PLT 234  --   --   --   --  197  --   APTT  --  28  --   --  82*  --   --   LABPROT  --  14.0  --   --   --   --   --   INR  --  1.1  --   --   --   --   --   HEPARINUNFRC  --  <0.10*  --   --  0.48  --  0.34  CREATININE 1.10  --   --   --   --  0.95  --   TROPONINIHS 373* 449* 405* 494*  --   --   --      Estimated Creatinine Clearance: 95.2 mL/min (by C-G formula based on SCr of 0.95 mg/dL).  Medical History: Past Medical History:  Diagnosis Date   Allergic rhinitis    Anxiety    Arthritis    "lower back, right thumb, knees" (10/04/2012)   Asthma    "grew out of it" (10/04/2012)   CKD (chronic kidney disease), stage II    Coronary artery disease    a. s/p prior stent to LAD;  b. LHC 6/14: DES to pRCA. c. DES to dCx 01/2017 with residual disease.   Depression    Hyperlipidemia    Hypertension    Ischemic cardiomyopathy    LV (left ventricular) mural thrombus    MI (myocardial infarction) ([place]) 03/2007   OSA (obstructive sleep apnea)    mild   Pneumonia    "as a child" (10/04/2012)   Sinus bradycardia     Assessment: Austin Ingram with PMH of LV mural thrombus on Apixaban who presents to [facility] ED with chest and flank pain. Troponin elevated. Pharmacy consulted for IV heparin dosing for ACS.  Last dose of Apixaban 5mg  PO BID reported per patient as 11/26 at 1800. Hgb, Pltc WNL. Note that recent Apixaban use can falsely elevate heparin level.    Goal of Therapy:  Heparin level 0.3-0.7 units/ml aPTT 66-102 seconds Monitor platelets by anticoagulation protocol: Yes   Patient reported last Apixaban dose was 11/26 @ 2100 however Baseline Heparin level < 0.1 and Baseline aPTT = 28 sec  03/28/21 0300 Heparin level = 0.48 (therapeutic) with heparin gtt @ 1150 units/hr aPTT = 82 sec (therapeutic) CBC this AM not due to be drawn until 0500 As baseline HL undetectable, and current HL and aPTT correlate will use only heparin levels. 0915 HL = 0.34 units/ml  Plan:  Will increase Heparin infusion to 1250 units/hour, check daily HL Daily CBC, heparin level Monitor closely for  s/sx of bleeding  Otho Bellows PharmD [facility] [phone] 03/28/2021, 10:09 AM

## 2021-03-28 NOTE — H&P (View-Only) (Signed)
Cardiology Consultation:   Patient ID: Austin Ingram. MRN: [id number]; DOB: 01/15/1961  Admit date: 03/27/2021 Date of Consult: 03/28/2021  PCP:  Alroy Dust, L.Marlou Sa, MD   [facility] Cardiologist:  Lauree Chandler, MD  Advanced Heart Failure:  Loralie Champagne, MD       Patient Profile:   Austin Ingram. is a 60 y.o. male with a hx of CAD s/p PCI RCA 2004, BMS LAD 2nd MI 2008, DES CFX w/ RCX 100% 2018, ICM w/ EF 30-35%, HTN, LV thrombus on Eliquis, HTN, HLD, OSA, sinus brady, depression, who is being seen 03/28/2021 for the evaluation of chest pain at the request of Dr. Lonny Prude.  History of Present Illness:   Austin Ingram was seen in consult by Dr Aundra Dubin on 11/21. ASA d/c'd since on Eliquis, started on Coreg (watch HR) and hydral 25 mg tid, cont spiro 25 and Entresto 97-103, recheck echo in 3 mo >> ICD if no better.   Pt was admitted w/ CP and abd pain w/ vomiting. Pain worse w/ deep inspiration. Trop peak 449, Cards asked to see.   Austin Ingram was in his usual state of health on Thanksgiving day.  A day later, he got in a severe argument with his son.  He had an episode of chest pain.  It lasted a while, but was eventually relieved with sublingual nitroglycerin.  He was fine until Sunday, when he was having chest pain and abdominal pain when he got up.  He vomited several times, but that did not help his abdominal pain.  His symptoms did not resolve with nitroglycerin.  He came to the emergency room, and he was given ASA 324 mg, fentanyl 100 mcg, Ativan 1 mg, Zofran 4 mg, IV heparin, and a liter of saline.  His chest pain has resolved and his abdominal pain is improved although his left abdomen is still tender.  The chest pain was his usual angina, but he has not had an episode in months.  He has a dog that he walks, and is active around the house.  He rides a bicycle upon occasion as well.  He has not had any chest pain with exertion.   Past Medical History:  Diagnosis Date    Allergic rhinitis    Anxiety    Arthritis    "lower back, right thumb, knees" (10/04/2012)   Asthma    "grew out of it" (10/04/2012)   CKD (chronic kidney disease), stage II    Coronary artery disease    a. s/p prior stent to LAD;  b. LHC 6/14: DES to pRCA. c. DES to dCx 01/2017 with residual disease.   Depression    Hyperlipidemia    Hypertension    Ischemic cardiomyopathy    LV (left ventricular) mural thrombus    MI (myocardial infarction) ([place]) 03/2007   OSA (obstructive sleep apnea)    mild   Pneumonia    "as a child" (10/04/2012)   Sinus bradycardia     Past Surgical History:  Procedure Laterality Date   ARTHROPLASTY Right 1993   'crushed; removed bone fragments" (10/04/2012)   CARDIAC CATHETERIZATION  2010   CORONARY ANGIOPLASTY WITH STENT PLACEMENT  2008; 10/04/2012   "1 + 1" (10/04/2012)   CORONARY STENT INTERVENTION N/A 02/26/2017   Procedure: CORONARY STENT INTERVENTION;  Surgeon: Burnell Blanks, MD;  Location: [place] [facility];  Service: Cardiovascular;  Laterality: N/A;   EXPLORATORY LAPAROTOMY  1990's?   "went in to repair hernia; found fatty  tissue instead; no hernia repair" (10/04/2012)   INTRAVASCULAR PRESSURE WIRE/FFR STUDY N/A 02/26/2017   Procedure: INTRAVASCULAR PRESSURE WIRE/FFR STUDY;  Surgeon: Burnell Blanks, MD;  Location: [place] [facility];  Service: Cardiovascular;  Laterality: N/A;   LEFT HEART CATH AND CORONARY ANGIOGRAPHY N/A 02/26/2017   Procedure: LEFT HEART CATH AND CORONARY ANGIOGRAPHY;  Surgeon: Burnell Blanks, MD;  Location: [place] [place] [facility];  Service: Cardiovascular;  Laterality: N/A;   LEFT HEART CATHETERIZATION WITH CORONARY ANGIOGRAM N/A 10/04/2012   Procedure: LEFT HEART CATHETERIZATION WITH CORONARY ANGIOGRAM;  Surgeon: Sherren Mocha, MD;  Location: [facility];  Service: Cardiovascular;  Laterality: N/A;     Home Medications:  Prior to Admission medications   Medication Sig Start Date End Date Taking?  Authorizing Provider  albuterol (VENTOLIN HFA) 108 (90 Base) MCG/ACT inhaler Inhale 2 puffs into the lungs every 4 (four) hours as needed (for cough).   Yes [provider]  amLODipine (NORVASC) 10 MG tablet Take 10 mg by mouth daily.   Yes [provider]  amoxicillin (AMOXIL) 500 MG capsule Take 2,000 mg by mouth See admin instructions. Take 2000mg  (4 capsules) by mouth one hour prior to dental appointment 12/10/18  Yes [provider]  atorvastatin (LIPITOR) 40 MG tablet Take 1 tablet (40 mg total) by mouth daily. 03/21/21  Yes Larey Dresser, MD  Azelastine HCl 137 MCG/SPRAY SOLN Place 1 spray into both nostrils 2 (two) times daily. As needed 12/23/18  Yes [provider]  citalopram (CELEXA) 40 MG tablet Take 0.5 tablets by mouth daily. 07/22/14  Yes [provider]  dapagliflozin propanediol (FARXIGA) 10 MG TABS tablet Take 1 tablet (10 mg total) by mouth daily before breakfast. 02/03/21  Yes McAlhany, Annita Brod, MD  ENTRESTO 97-103 MG TAKE 1 TABLET BY MOUTH TWICE A DAY Patient taking differently: Take by mouth 2 (two) times daily. 04/26/20  Yes Dunn, Dayna N, [name]  finasteride (PROSCAR) 5 MG tablet Take 5 mg by mouth daily. 02/13/17  Yes [provider]  isosorbide mononitrate (IMDUR) 30 MG 24 hr tablet Take 1 tablet (30 mg total) by mouth daily. 02/03/21  Yes Burnell Blanks, MD  nabumetone (RELAFEN) 500 MG tablet Take 500 mg by mouth daily.   Yes [provider]  nitroGLYCERIN (NITROSTAT) 0.4 MG SL tablet PLACE 1 TABLET UNDER THE TONGUE EVERY 5 MINUTES AS NEEDED FOR CHEST PAIN. Please schedule yearly appointment for future refills. Thank you Patient taking differently: Place 0.4mg  tablet under the tongue every 5 minutes as needed for chest pain. Please schedule yearly appointment for future refills. Thank you 07/05/20  Yes Burnell Blanks, MD  spironolactone (ALDACTONE) 25 MG tablet Take 1 tablet (25 mg total) by  mouth daily. 09/02/20 03/21/22 Yes Dunn, Nedra Hai, [name]  tamsulosin (FLOMAX) 0.4 MG CAPS capsule Take 0.4 mg by mouth daily with breakfast.  01/17/17  Yes [provider]  acetaminophen (TYLENOL) 325 MG tablet Take 1 tablet by mouth every 6 (six) hours as needed for moderate pain. Patient not taking: Reported on 03/27/2021 07/22/14   [provider]  carvedilol (COREG) 3.125 MG tablet Take 1 tablet (3.125 mg total) by mouth 2 (two) times daily. Patient not taking: Reported on 03/27/2021 03/21/21 03/21/22  Larey Dresser, MD  ELIQUIS 5 MG TABS tablet TAKE 1 TABLET BY MOUTH TWICE A DAY Patient taking differently: Take 5 mg by mouth 2 (two) times daily. 05/10/20   Dunn, Nedra Hai, [name]  hydrALAZINE (APRESOLINE)  25 MG tablet Take 1 tablet (25 mg total) by mouth 3 (three) times daily. Patient not taking: Reported on 03/27/2021 03/21/21 03/21/22  Larey Dresser, MD    Inpatient Medications: Scheduled Meds:  amLODipine  10 mg Oral Daily   aspirin EC  81 mg Oral Daily   atorvastatin  40 mg Oral Daily   citalopram  20 mg Oral Daily   finasteride  5 mg Oral Daily   isosorbide mononitrate  30 mg Oral Once   [START ON 03/29/2021] isosorbide mononitrate  60 mg Oral Daily   polyethylene glycol  17 g Oral Daily   sacubitril-valsartan  1 tablet Oral BID   sodium chloride flush  3 mL Intravenous Q12H   spironolactone  25 mg Oral Daily   tamsulosin  0.4 mg Oral Q breakfast   Continuous Infusions:  sodium chloride     heparin 1,250 Units/hr (03/28/21 1043)   PRN Meds: sodium chloride, acetaminophen **OR** acetaminophen, albuterol, fentaNYL (SUBLIMAZE) injection, HYDROcodone-acetaminophen, ondansetron **OR** ondansetron (ZOFRAN) IV, sodium chloride flush  Allergies:    Allergies  Allergen Reactions   Adhesive [Tape] Other (See Comments)    SKIN SCARRING/IRRITATION. PAPER TAPE IS OKAY    Social History:   Social History   Socioeconomic History   Marital status: Married     Spouse name: Not on file   Number of children: 1   Years of education: Not on file   Highest education level: Not on file  Occupational History    Employer: [employer]  Tobacco Use   Smoking status: Former    Packs/day: 0.50    Years: 20.00    Pack years: 10.00    Types: Cigarettes    Quit date: 12/31/1998    Years since quitting: 22.2   Smokeless tobacco: Never  Vaping Use   Vaping Use: Never used  Substance and Sexual Activity   Alcohol use: Yes    Comment: 10/04/2012 "1 beer plus 2 mixed drinks once/month"   Drug use: Yes    Frequency: 14.0 times per week    Types: Marijuana    Comment: smokes 2 joints/day   Sexual activity: Yes  Other Topics Concern   Not on file  Social History Narrative   Not on file   Social Determinants of Health   Financial Resource Strain: Not on file  Food Insecurity: Not on file  Transportation Needs: Not on file  Physical Activity: Not on file  Stress: Not on file  Social Connections: Not on file  Intimate Partner Violence: Not on file    Family History:   Family History  Problem Relation Age of Onset   Atrial fibrillation Mother        irregular heart beats   Hypertension Mother    Diabetes type II Mother    CAD Maternal Grandfather    CAD Maternal Grandmother      ROS:  Please see the history of present illness.  All other ROS reviewed and negative.     Physical Exam/Data:   Vitals:   03/28/21 0714 03/28/21 0800 03/28/21 0900 03/28/21 1000  BP: (!) 166/93 139/75 (!) 166/106 (!) 144/85  Pulse: 61 (!) 56 (!) 56 (!) 59  Resp: 19 (!) 21 13 17   Temp:      TempSrc:      SpO2: 99% 95% 100% 95%  Weight: 92.5 kg     Height: 5\' 9"  (1.753 m)      No intake or output data in the 24  hours ending 03/28/21 1100 Last 3 Weights 03/28/2021 03/21/2021 02/03/2021  Weight (lbs) 204 lb 204 lb 9.6 oz 200 lb  Weight (kg) 92.534 kg 92.806 kg 90.719 kg     Body mass index is 30.13 kg/m.  General:  Well nourished, well developed, in no  acute distress HEENT: normal Neck: no JVD Vascular: No carotid bruits; Distal pulses 2+ bilaterally Cardiac:  normal S1, S2; RRR; soft murmur  Lungs:  clear to auscultation bilaterally, no wheezing, rhonchi or rales, tender lower left ribs Abd: soft, tender LUQ, no hepatomegaly  Ext: no edema Musculoskeletal:  No deformities, BUE and BLE strength normal and equal Skin: warm and dry  Neuro:  CNs 2-12 intact, no focal abnormalities noted Psych:  Normal affect   EKG:  The EKG was personally reviewed and demonstrates: 11/27 ECG is S brady, HR 50, abnl T waves, no sig change from 03/21/2021 Telemetry:  Telemetry was personally reviewed and demonstrates: Sinus bradycardia  Relevant CV Studies:  ECHO: ordered  CARDIAC CATH: 02/26/2017 Ost RCA to Prox RCA lesion, 40 %stenosed. Prox RCA lesion, 60 %stenosed. Mid RCA lesion, 100 %stenosed. 1st Mrg lesion, 30 %stenosed. Mid Cx lesion, 75 %stenosed. Ost LAD to Prox LAD lesion, 20 %stenosed. 1st Diag lesion, 60 %stenosed.   1. Triple vessel CAD 2. Patent stent proximal LAD with minimal in stent restenosis 3. Severe stenosis distal Circumflex artery 4. Successful PTCA/DES x 1 distal Circumflex 5. Patent proximal RCA stent followed by chronic total occlusion of RCA in the mid segment. The distal RCA fills from left to right collaterals.    Recommendations: Will continue DAPT with ASA and Plavix. Continue statin. He does not tolerate ARB,Ace-inh or beta blockers. Same day PCI discharge. Will need office follow up in 1-2 weeks.  Diagnostic Dominance: Right    ECHO: 08/16/2020  1. Left ventricular ejection fraction, by estimation, is 30 to 35%. The  left ventricle has moderately decreased function. The left ventricle  demonstrates regional wall motion abnormalities with mid to apical  anteroseptal and inferoseptal akinesis,  akinesis of the apical anterior wall, the apical inferior wall, the apical  lateral wall, and the true apex. Left  ventricular diastolic parameters are  consistent with Grade I diastolic dysfunction (impaired relaxation).   2. Right ventricular systolic function is normal. The right ventricular  size is normal. Tricuspid regurgitation signal is inadequate for assessing  PA pressure.   3. The mitral valve is normal in structure. No evidence of mitral valve  regurgitation.   4. The aortic valve is tricuspid. Aortic valve regurgitation is not  visualized. No aortic stenosis is present.   5. The inferior vena cava is normal in size with greater than 50%  respiratory variability, suggesting right atrial pressure of 3 mmHg.   Laboratory Data:  High Sensitivity Troponin:   Recent Labs  Lab 03/27/21 1726 03/27/21 1945 03/27/21 2334 03/28/21 0246 03/28/21 0914  TROPONINIHS 373* 449* 405* 494* 530*     Chemistry Recent Labs  Lab 03/21/21 1526 03/27/21 1726 03/28/21 0455  NA 142 139 139  K 3.9 3.7 3.3*  CL 107 108 108  CO2 28 25 24   GLUCOSE 82 128* 90  BUN 14 16 13   CREATININE 1.31* 1.10 0.95  CALCIUM 8.8* 8.9 8.5*  MG  --   --  2.0  GFRNONAA >60 >60 >60  ANIONGAP 7 6 7     Recent Labs  Lab 03/27/21 1726 03/28/21 0455  PROT 8.0 6.5  ALBUMIN 4.5 3.7  AST 28  24  ALT 18 16  ALKPHOS 71 60  BILITOT 0.6 0.7   Lipids  Recent Labs  Lab 03/28/21 0455  CHOL 110  TRIG 26  HDL 40*  LDLCALC 65  CHOLHDL 2.8    Hematology Recent Labs  Lab 03/27/21 1726 03/28/21 0455  WBC 7.3 7.8  RBC 5.56 5.05  HGB 13.0 11.9*  HCT 42.3 38.0*  MCV 76.1* 75.2*  MCH 23.4* 23.6*  MCHC 30.7 31.3  RDW 15.7* 15.8*  PLT 234 197   Thyroid  Recent Labs  Lab 03/28/21 0455  TSH 0.472    BNP Recent Labs  Lab 03/21/21 1526  BNP 125.9*    DDimer No results for input(s): DDIMER in the last 168 hours.  Lab Results  Component Value Date   HGBA1C 6.2 (H) 03/06/2017   Drugs of Abuse     Component Value Date/Time   LABOPIA NONE DETECTED 03/28/2021 0246   COCAINSCRNUR NONE DETECTED 03/28/2021  0246   LABBENZ NONE DETECTED 03/28/2021 0246   AMPHETMU NONE DETECTED 03/28/2021 0246   THCU POSITIVE (A) 03/28/2021 0246   LABBARB NONE DETECTED 03/28/2021 0246    Urinalysis    Component Value Date/Time   COLORURINE YELLOW 03/28/2021 0246   APPEARANCEUR CLEAR 03/28/2021 0246   LABSPEC >1.046 (H) 03/28/2021 0246   PHURINE 5.0 03/28/2021 0246   GLUCOSEU >=500 (A) 03/28/2021 0246   HGBUR NEGATIVE 03/28/2021 0246   BILIRUBINUR NEGATIVE 03/28/2021 0246   KETONESUR 20 (A) 03/28/2021 0246   PROTEINUR NEGATIVE 03/28/2021 0246   NITRITE NEGATIVE 03/28/2021 0246   LEUKOCYTESUR NEGATIVE 03/28/2021 0246    Radiology/Studies:  DG Chest 2 View  Result Date: 03/27/2021 CLINICAL DATA:  Sternal chest pain and LEFT flank pain, history MI, coronary stenting, hypertension, smoker EXAM: CHEST - 2 VIEW COMPARISON:  03/06/2017 FINDINGS: Borderline enlargement of cardiac silhouette. Mediastinal contours and pulmonary vascularity normal. Lungs clear. No pulmonary infiltrate, pleural effusion, or pneumothorax. Osseous structures unremarkable. IMPRESSION: No acute abnormalities. Electronically Signed   By: Lavonia Dana M.D.   On: 03/27/2021 19:03   CT ABDOMEN PELVIS W CONTRAST  Result Date: 03/28/2021 CLINICAL DATA:  Diverticulitis, complication suspected. EXAM: CT ABDOMEN AND PELVIS WITH CONTRAST TECHNIQUE: Multidetector CT imaging of the abdomen and pelvis was performed using the standard protocol following bolus administration of intravenous contrast. CONTRAST:  44mL OMNIPAQUE IOHEXOL 350 MG/ML SOLN COMPARISON:  12/08/2002. FINDINGS: Lower chest: The heart is normal in size and coronary artery calcifications are noted. Minimal atelectasis is present at the lung bases. Hepatobiliary: A subcentimeter hypodensity is present in the left lobe of the liver which is too small to further characterize. No biliary ductal dilatation. The gallbladder is without stones. Pancreas: Unremarkable. No pancreatic ductal  dilatation or surrounding inflammatory changes. Spleen: A wedge-shaped hypodensity is present in the posterior aspect of the spleen suggesting splenic infarcts of indeterminate age. Calcified granuloma are noted in the spleen. Adrenals/Urinary Tract: No adrenal nodule or mass. No renal calculus or hydronephrosis bilaterally. Hypodensities are present in the left kidney which can not be further characterized on this exam, the largest measuring 1.4 cm. The bladder is unremarkable. Stomach/Bowel: The stomach is unremarkable. No bowel obstruction, free air, or pneumatosis. A normal appendix is seen in the right lower quadrant. A few scattered diverticula are noted along the colon without evidence of diverticulitis. There is fatty infiltration of the walls of the descending colon suggesting chronic inflammatory changes. Vascular/Lymphatic: Aortic atherosclerosis. No enlarged abdominal or pelvic lymph nodes. Reproductive: The prostate gland is  normal in size. Other: Fat containing inguinal hernias are present bilaterally. There is a fat containing umbilical hernia. No free fluid. Musculoskeletal: No acute osseous abnormality. IMPRESSION: 1. Diverticulosis without diverticulitis. 2. Wedge shaped hypodensity in the posterior aspect of the spleen, possible infarct of indeterminate age. Findings may also be associated with laceration in the setting of trauma. 3. Coronary artery calcifications. 4. Indeterminate hypodensities in left kidney. Ultrasound is recommended for further evaluation. 5. Remaining chronic findings as described above. Electronically Signed   By: Brett Fairy M.D.   On: 03/28/2021 02:06   US RENAL  Result Date: 03/28/2021 CLINICAL DATA:  Abnormal findings on recent abdomen pelvis CT. EXAM: RENAL / URINARY TRACT ULTRASOUND COMPLETE COMPARISON:  Abdomen pelvis CT, dated March 28, 2021. FINDINGS: Right Kidney: Renal measurements: 10.1 cm x 4.9 cm x 4.9 cm = volume: 125.4 mL. Echogenicity within normal  limits. No mass or hydronephrosis visualized. Left Kidney: Renal measurements: 11.5 cm x 5.2 cm x 5.9 cm = volume: 182.6 mL. The left kidney is lobulated in appearance. No abnormal flow is seen within the renal parenchyma on color Doppler evaluation. Echogenicity within normal limits. No mass or hydronephrosis visualized. Bladder: Appears normal for degree of bladder distention. Other: None. IMPRESSION: 1. Lobulated left kidney without ultrasonographic findings that correspond to the indeterminate hypodensities seen within the left kidney on the prior abdomen and pelvis CT. Electronically Signed   By: Virgina Norfolk M.D.   On: 03/28/2021 02:51     Assessment and Plan:   Chest pain: -Happened in the setting of significant emotional stress and physical stress - Symptoms relieved with nitroglycerin - Some atypical features as well, with tenderness lower left ribs - Trop mildly trending up, but this is in the setting of physical stress and a known occluded RCA -At the time of his cath in 2018, he had moderate disease in his D1, CFX, and proximal RCA. -Until these last few days, he has not had chest pain in a very long time -Troponin elevation could be from demand ischemia - We will increase the Imdur 30-> 60 mg qd - Follow-up on echo - Discuss further evaluation with MD  2.  Abdominal pain, N/V - Symptoms have improved, but he still has LUQ Q abdominal tenderness - Possible splenic infarct seen on CT, no renal issues by ultrasound - Management per IM  3.  Chronic systolic CHF: - He is compliant with medications including Entresto 97-103 twice daily, Imdur 30 mg daily, amlodipine 10 mg daily, spironolactone 25 mg daily - Dr. Aundra Dubin had added Coreg 3.125 mg twice daily, but he was not taking it  4.  Chronic anticoagulation - Secondary to a history of LV thrombus - PTA on Eliquis 5 mg twice daily - Eliquis is on hold and he is on IV heparin at this time -Unclear how he could have a splenic  infarct on Eliquis - Discuss compliance with the patient   Risk Assessment/Risk Scores:     HEAR Score (for undifferentiated chest pain):  HEAR Score: 5  New York Heart Association (NYHA) Functional Class NYHA Class II     For questions or updates, please contact [facility] Please consult [email or web address] for contact info under    Signed, Rosaria Ferries, [name]  03/28/2021 11:00 AM

## 2021-03-28 NOTE — Assessment & Plan Note (Signed)
Chest pain with associated elevated troponin. High risk history. Patient is on Eliquis as an outpatient and has been transitioned to heparin IV inpatient. Cardiology consult and are recommending heart catheterization. Troponin continues to trend upwards. Repeat troponin still pending -Continue to trend Troponin until peak

## 2021-03-28 NOTE — Assessment & Plan Note (Signed)
-  Continue finasteride and Tamsulosin

## 2021-03-28 NOTE — Assessment & Plan Note (Addendum)
-  Continue amlodipine, spironolactone, Entresto, and Imdur

## 2021-03-28 NOTE — Consult Note (Signed)
Cardiology Consultation:   Patient ID: Austin Ingram. MRN: [id number]; DOB: [birth year]  Admit date: 03/27/2021 Date of Consult: 03/28/2021  PCP:  Alroy Dust, L.Marlou Sa, MD   [facility] Cardiologist:  Lauree Chandler, MD  Advanced Heart Failure:  Loralie Champagne, MD       Patient Profile:   Austin Ingram. is a 60 y.o. male with a hx of CAD s/p PCI RCA 2004, BMS LAD 2nd MI 2008, DES CFX w/ RCX 100% 2018, ICM w/ EF 30-35%, HTN, LV thrombus on Eliquis, HTN, HLD, OSA, sinus brady, depression, who is being seen 03/28/2021 for the evaluation of chest pain at the request of Dr. Lonny Prude.  History of Present Illness:   Austin Ingram was seen in consult by Dr Aundra Dubin on 11/21. ASA d/c'd since on Eliquis, started on Coreg (watch HR) and hydral 25 mg tid, cont spiro 25 and Entresto 97-103, recheck echo in 3 mo >> ICD if no better.   Pt was admitted w/ CP and abd pain w/ vomiting. Pain worse w/ deep inspiration. Trop peak 449, Cards asked to see.   Austin Ingram was in his usual state of health on Thanksgiving day.  A day later, he got in a severe argument with his son.  He had an episode of chest pain.  It lasted a while, but was eventually relieved with sublingual nitroglycerin.  He was fine until Sunday, when he was having chest pain and abdominal pain when he got up.  He vomited several times, but that did not help his abdominal pain.  His symptoms did not resolve with nitroglycerin.  He came to the emergency room, and he was given ASA 324 mg, fentanyl 100 mcg, Ativan 1 mg, Zofran 4 mg, IV heparin, and a liter of saline.  His chest pain has resolved and his abdominal pain is improved although his left abdomen is still tender.  The chest pain was his usual angina, but he has not had an episode in months.  He has a dog that he walks, and is active around the house.  He rides a bicycle upon occasion as well.  He has not had any chest pain with exertion.   Past Medical History:  Diagnosis Date    Allergic rhinitis    Anxiety    Arthritis    "lower back, right thumb, knees" (10/04/2012)   Asthma    "grew out of it" (10/04/2012)   CKD (chronic kidney disease), stage II    Coronary artery disease    a. s/p prior stent to LAD;  b. LHC 6/14: DES to pRCA. c. DES to dCx 01/2017 with residual disease.   Depression    Hyperlipidemia    Hypertension    Ischemic cardiomyopathy    LV (left ventricular) mural thrombus    MI (myocardial infarction) ([place]) 03/2007   OSA (obstructive sleep apnea)    mild   Pneumonia    "as a child" (10/04/2012)   Sinus bradycardia     Past Surgical History:  Procedure Laterality Date   ARTHROPLASTY Right 1993   'crushed; removed bone fragments" (10/04/2012)   CARDIAC CATHETERIZATION  2010   CORONARY ANGIOPLASTY WITH STENT PLACEMENT  2008; 10/04/2012   "1 + 1" (10/04/2012)   CORONARY STENT INTERVENTION N/A 02/26/2017   Procedure: CORONARY STENT INTERVENTION;  Surgeon: [facility] Blanks, MD;  Location: [facility];  Service: Cardiovascular;  Laterality: N/A;   EXPLORATORY LAPAROTOMY  1990's?   "went in to repair hernia; found fatty  tissue instead; no hernia repair" (10/04/2012)   INTRAVASCULAR PRESSURE WIRE/FFR STUDY N/A 02/26/2017   Procedure: INTRAVASCULAR PRESSURE WIRE/FFR STUDY;  Surgeon: [facility] Blanks, MD;  Location: [facility];  Service: Cardiovascular;  Laterality: N/A;   LEFT HEART CATH AND CORONARY ANGIOGRAPHY N/A 02/26/2017   Procedure: LEFT HEART CATH AND CORONARY ANGIOGRAPHY;  Surgeon: [facility] Blanks, MD;  Location: [facility];  Service: Cardiovascular;  Laterality: N/A;   LEFT HEART CATHETERIZATION WITH CORONARY ANGIOGRAM N/A 10/04/2012   Procedure: LEFT HEART CATHETERIZATION WITH CORONARY ANGIOGRAM;  Surgeon: Sherren Mocha, MD;  Location: [facility];  Service: Cardiovascular;  Laterality: N/A;     Home Medications:  Prior to Admission medications   Medication Sig Start Date End Date Taking?  Authorizing Provider  albuterol (VENTOLIN HFA) 108 (90 Base) MCG/ACT inhaler Inhale 2 puffs into the lungs every 4 (four) hours as needed (for cough).   Yes [provider]  amLODipine (NORVASC) 10 MG tablet Take 10 mg by mouth daily.   Yes [provider]  amoxicillin (AMOXIL) 500 MG capsule Take 2,000 mg by mouth See admin instructions. Take 2000mg  (4 capsules) by mouth one hour prior to dental appointment 12/10/18  Yes [provider]  atorvastatin (LIPITOR) 40 MG tablet Take 1 tablet (40 mg total) by mouth daily. 03/21/21  Yes Larey Dresser, MD  Azelastine HCl 137 MCG/SPRAY SOLN Place 1 spray into both nostrils 2 (two) times daily. As needed 12/23/18  Yes [provider]  citalopram (CELEXA) 40 MG tablet Take 0.5 tablets by mouth daily. 07/22/14  Yes [provider]  dapagliflozin propanediol (FARXIGA) 10 MG TABS tablet Take 1 tablet (10 mg total) by mouth daily before breakfast. 02/03/21  Yes McAlhany, Annita Brod, MD  ENTRESTO 97-103 MG TAKE 1 TABLET BY MOUTH TWICE A DAY Patient taking differently: Take by mouth 2 (two) times daily. 04/26/20  Yes Dunn, Dayna N, [name]  finasteride (PROSCAR) 5 MG tablet Take 5 mg by mouth daily. 02/13/17  Yes [provider]  isosorbide mononitrate (IMDUR) 30 MG 24 hr tablet Take 1 tablet (30 mg total) by mouth daily. 02/03/21  Yes Burnell Blanks, MD  nabumetone (RELAFEN) 500 MG tablet Take 500 mg by mouth daily.   Yes [provider]  nitroGLYCERIN (NITROSTAT) 0.4 MG SL tablet PLACE 1 TABLET UNDER THE TONGUE EVERY 5 MINUTES AS NEEDED FOR CHEST PAIN. Please schedule yearly appointment for future refills. Thank you Patient taking differently: Place 0.4mg  tablet under the tongue every 5 minutes as needed for chest pain. Please schedule yearly appointment for future refills. Thank you 07/05/20  Yes Burnell Blanks, MD  spironolactone (ALDACTONE) 25 MG tablet Take 1 tablet (25 mg total) by  mouth daily. 09/02/20 03/21/22 Yes Dunn, Nedra Hai, [name]  tamsulosin (FLOMAX) 0.4 MG CAPS capsule Take 0.4 mg by mouth daily with breakfast.  01/17/17  Yes [provider]  acetaminophen (TYLENOL) 325 MG tablet Take 1 tablet by mouth every 6 (six) hours as needed for moderate pain. Patient not taking: Reported on 03/27/2021 07/22/14   [provider]  carvedilol (COREG) 3.125 MG tablet Take 1 tablet (3.125 mg total) by mouth 2 (two) times daily. Patient not taking: Reported on 03/27/2021 03/21/21 03/21/22  Larey Dresser, MD  ELIQUIS 5 MG TABS tablet TAKE 1 TABLET BY MOUTH TWICE A DAY Patient taking differently: Take 5 mg by mouth 2 (two) times daily. 05/10/20   Dunn, Nedra Hai, [name]  hydrALAZINE (APRESOLINE)  25 MG tablet Take 1 tablet (25 mg total) by mouth 3 (three) times daily. Patient not taking: Reported on 03/27/2021 03/21/21 03/21/22  Larey Dresser, MD    Inpatient Medications: Scheduled Meds:  amLODipine  10 mg Oral Daily   aspirin EC  81 mg Oral Daily   atorvastatin  40 mg Oral Daily   citalopram  20 mg Oral Daily   finasteride  5 mg Oral Daily   isosorbide mononitrate  30 mg Oral Once   [START ON 03/29/2021] isosorbide mononitrate  60 mg Oral Daily   polyethylene glycol  17 g Oral Daily   sacubitril-valsartan  1 tablet Oral BID   sodium chloride flush  3 mL Intravenous Q12H   spironolactone  25 mg Oral Daily   tamsulosin  0.4 mg Oral Q breakfast   Continuous Infusions:  sodium chloride     heparin 1,250 Units/hr (03/28/21 1043)   PRN Meds: sodium chloride, acetaminophen **OR** acetaminophen, albuterol, fentaNYL (SUBLIMAZE) injection, HYDROcodone-acetaminophen, ondansetron **OR** ondansetron (ZOFRAN) IV, sodium chloride flush  Allergies:    Allergies  Allergen Reactions   Adhesive [Tape] Other (See Comments)    SKIN SCARRING/IRRITATION. PAPER TAPE IS OKAY    Social History:   Social History   Socioeconomic History   Marital status: Married     Spouse name: Not on file   Number of children: 1   Years of education: Not on file   Highest education level: Not on file  Occupational History    Employer: [employer]  Tobacco Use   Smoking status: Former    Packs/day: 0.50    Years: 20.00    Pack years: 10.00    Types: Cigarettes    Quit date: 12/31/1998    Years since quitting: 22.2   Smokeless tobacco: Never  Vaping Use   Vaping Use: Never used  Substance and Sexual Activity   Alcohol use: Yes    Comment: 10/04/2012 "1 beer plus 2 mixed drinks once/month"   Drug use: Yes    Frequency: 14.0 times per week    Types: Marijuana    Comment: smokes 2 joints/day   Sexual activity: Yes  Other Topics Concern   Not on file  Social History Narrative   Not on file   Social Determinants of Health   Financial Resource Strain: Not on file  Food Insecurity: Not on file  Transportation Needs: Not on file  Physical Activity: Not on file  Stress: Not on file  Social Connections: Not on file  Intimate Partner Violence: Not on file    Family History:   Family History  Problem Relation Age of Onset   Atrial fibrillation Mother        irregular heart beats   Hypertension Mother    Diabetes type II Mother    CAD Maternal Grandfather    CAD Maternal Grandmother      ROS:  Please see the history of present illness.  All other ROS reviewed and negative.     Physical Exam/Data:   Vitals:   03/28/21 0714 03/28/21 0800 03/28/21 0900 03/28/21 1000  BP: (!) 166/93 139/75 (!) 166/106 (!) 144/85  Pulse: 61 (!) 56 (!) 56 (!) 59  Resp: 19 (!) 21 13 17   Temp:      TempSrc:      SpO2: 99% 95% 100% 95%  Weight: 92.5 kg     Height: 5\' 9"  (1.753 m)      No intake or output data in the 24  hours ending 03/28/21 1100 Last 3 Weights 03/28/2021 03/21/2021 02/03/2021  Weight (lbs) 204 lb 204 lb 9.6 oz 200 lb  Weight (kg) 92.534 kg 92.806 kg 90.719 kg     Body mass index is 30.13 kg/m.  General:  Well nourished, well developed, in no  acute distress HEENT: normal Neck: no JVD Vascular: No carotid bruits; Distal pulses 2+ bilaterally Cardiac:  normal S1, S2; RRR; soft murmur  Lungs:  clear to auscultation bilaterally, no wheezing, rhonchi or rales, tender lower left ribs Abd: soft, tender LUQ, no hepatomegaly  Ext: no edema Musculoskeletal:  No deformities, BUE and BLE strength normal and equal Skin: warm and dry  Neuro:  CNs 2-12 intact, no focal abnormalities noted Psych:  Normal affect   EKG:  The EKG was personally reviewed and demonstrates: 11/27 ECG is S brady, HR 50, abnl T waves, no sig change from 03/21/2021 Telemetry:  Telemetry was personally reviewed and demonstrates: Sinus bradycardia  Relevant CV Studies:  ECHO: ordered  CARDIAC CATH: 02/26/2017 Ost RCA to Prox RCA lesion, 40 %stenosed. Prox RCA lesion, 60 %stenosed. Mid RCA lesion, 100 %stenosed. 1st Mrg lesion, 30 %stenosed. Mid Cx lesion, 75 %stenosed. Ost LAD to Prox LAD lesion, 20 %stenosed. 1st Diag lesion, 60 %stenosed.   1. Triple vessel CAD 2. Patent stent proximal LAD with minimal in stent restenosis 3. Severe stenosis distal Circumflex artery 4. Successful PTCA/DES x 1 distal Circumflex 5. Patent proximal RCA stent followed by chronic total occlusion of RCA in the mid segment. The distal RCA fills from left to right collaterals.    Recommendations: Will continue DAPT with ASA and Plavix. Continue statin. He does not tolerate ARB,Ace-inh or beta blockers. Same day PCI discharge. Will need office follow up in 1-2 weeks.  Diagnostic Dominance: Right    ECHO: 08/16/2020  1. Left ventricular ejection fraction, by estimation, is 30 to 35%. The  left ventricle has moderately decreased function. The left ventricle  demonstrates regional wall motion abnormalities with mid to apical  anteroseptal and inferoseptal akinesis,  akinesis of the apical anterior wall, the apical inferior wall, the apical  lateral wall, and the true apex. Left  ventricular diastolic parameters are  consistent with Grade I diastolic dysfunction (impaired relaxation).   2. Right ventricular systolic function is normal. The right ventricular  size is normal. Tricuspid regurgitation signal is inadequate for assessing  PA pressure.   3. The mitral valve is normal in structure. No evidence of mitral valve  regurgitation.   4. The aortic valve is tricuspid. Aortic valve regurgitation is not  visualized. No aortic stenosis is present.   5. The inferior vena cava is normal in size with greater than 50%  respiratory variability, suggesting right atrial pressure of 3 mmHg.   Laboratory Data:  High Sensitivity Troponin:   Recent Labs  Lab 03/27/21 1726 03/27/21 1945 03/27/21 2334 03/28/21 0246 03/28/21 0914  TROPONINIHS 373* 449* 405* 494* 530*     Chemistry Recent Labs  Lab 03/21/21 1526 03/27/21 1726 03/28/21 0455  NA 142 139 139  K 3.9 3.7 3.3*  CL 107 108 108  CO2 28 25 24   GLUCOSE 82 128* 90  BUN 14 16 13   CREATININE 1.31* 1.10 0.95  CALCIUM 8.8* 8.9 8.5*  MG  --   --  2.0  GFRNONAA >60 >60 >60  ANIONGAP 7 6 7     Recent Labs  Lab 03/27/21 1726 03/28/21 0455  PROT 8.0 6.5  ALBUMIN 4.5 3.7  AST 28  24  ALT 18 16  ALKPHOS 71 60  BILITOT 0.6 0.7   Lipids  Recent Labs  Lab 03/28/21 0455  CHOL 110  TRIG 26  HDL 40*  LDLCALC 65  CHOLHDL 2.8    Hematology Recent Labs  Lab 03/27/21 1726 03/28/21 0455  WBC 7.3 7.8  RBC 5.56 5.05  HGB 13.0 11.9*  HCT 42.3 38.0*  MCV 76.1* 75.2*  MCH 23.4* 23.6*  MCHC 30.7 31.3  RDW 15.7* 15.8*  PLT 234 197   Thyroid  Recent Labs  Lab 03/28/21 0455  TSH 0.472    BNP Recent Labs  Lab 03/21/21 1526  BNP 125.9*    DDimer No results for input(s): DDIMER in the last 168 hours.  Lab Results  Component Value Date   HGBA1C 6.2 (H) 03/06/2017   Drugs of Abuse     Component Value Date/Time   LABOPIA NONE DETECTED 03/28/2021 0246   COCAINSCRNUR NONE DETECTED 03/28/2021  0246   LABBENZ NONE DETECTED 03/28/2021 0246   AMPHETMU NONE DETECTED 03/28/2021 0246   THCU POSITIVE (A) 03/28/2021 0246   LABBARB NONE DETECTED 03/28/2021 0246    Urinalysis    Component Value Date/Time   COLORURINE YELLOW 03/28/2021 0246   APPEARANCEUR CLEAR 03/28/2021 0246   LABSPEC >1.046 (H) 03/28/2021 0246   PHURINE 5.0 03/28/2021 0246   GLUCOSEU >=500 (A) 03/28/2021 0246   HGBUR NEGATIVE 03/28/2021 0246   BILIRUBINUR NEGATIVE 03/28/2021 0246   KETONESUR 20 (A) 03/28/2021 0246   PROTEINUR NEGATIVE 03/28/2021 0246   NITRITE NEGATIVE 03/28/2021 0246   LEUKOCYTESUR NEGATIVE 03/28/2021 0246    Radiology/Studies:  DG Chest 2 View  Result Date: 03/27/2021 CLINICAL DATA:  Sternal chest pain and LEFT flank pain, history MI, coronary stenting, hypertension, smoker EXAM: CHEST - 2 VIEW COMPARISON:  03/06/2017 FINDINGS: Borderline enlargement of cardiac silhouette. Mediastinal contours and pulmonary vascularity normal. Lungs clear. No pulmonary infiltrate, pleural effusion, or pneumothorax. Osseous structures unremarkable. IMPRESSION: No acute abnormalities. Electronically Signed   By: Lavonia Dana M.D.   On: 03/27/2021 19:03   CT ABDOMEN PELVIS W CONTRAST  Result Date: 03/28/2021 CLINICAL DATA:  Diverticulitis, complication suspected. EXAM: CT ABDOMEN AND PELVIS WITH CONTRAST TECHNIQUE: Multidetector CT imaging of the abdomen and pelvis was performed using the standard protocol following bolus administration of intravenous contrast. CONTRAST:  69mL OMNIPAQUE IOHEXOL 350 MG/ML SOLN COMPARISON:  12/08/2002. FINDINGS: Lower chest: The heart is normal in size and coronary artery calcifications are noted. Minimal atelectasis is present at the lung bases. Hepatobiliary: A subcentimeter hypodensity is present in the left lobe of the liver which is too small to further characterize. No biliary ductal dilatation. The gallbladder is without stones. Pancreas: Unremarkable. No pancreatic ductal  dilatation or surrounding inflammatory changes. Spleen: A wedge-shaped hypodensity is present in the posterior aspect of the spleen suggesting splenic infarcts of indeterminate age. Calcified granuloma are noted in the spleen. Adrenals/Urinary Tract: No adrenal nodule or mass. No renal calculus or hydronephrosis bilaterally. Hypodensities are present in the left kidney which can not be further characterized on this exam, the largest measuring 1.4 cm. The bladder is unremarkable. Stomach/Bowel: The stomach is unremarkable. No bowel obstruction, free air, or pneumatosis. A normal appendix is seen in the right lower quadrant. A few scattered diverticula are noted along the colon without evidence of diverticulitis. There is fatty infiltration of the walls of the descending colon suggesting chronic inflammatory changes. Vascular/Lymphatic: Aortic atherosclerosis. No enlarged abdominal or pelvic lymph nodes. Reproductive: The prostate gland is  normal in size. Other: Fat containing inguinal hernias are present bilaterally. There is a fat containing umbilical hernia. No free fluid. Musculoskeletal: No acute osseous abnormality. IMPRESSION: 1. Diverticulosis without diverticulitis. 2. Wedge shaped hypodensity in the posterior aspect of the spleen, possible infarct of indeterminate age. Findings may also be associated with laceration in the setting of trauma. 3. Coronary artery calcifications. 4. Indeterminate hypodensities in left kidney. Ultrasound is recommended for further evaluation. 5. Remaining chronic findings as described above. Electronically Signed   By: Brett Fairy M.D.   On: 03/28/2021 02:06   US RENAL  Result Date: 03/28/2021 CLINICAL DATA:  Abnormal findings on recent abdomen pelvis CT. EXAM: RENAL / URINARY TRACT ULTRASOUND COMPLETE COMPARISON:  Abdomen pelvis CT, dated March 28, 2021. FINDINGS: Right Kidney: Renal measurements: 10.1 cm x 4.9 cm x 4.9 cm = volume: 125.4 mL. Echogenicity within normal  limits. No mass or hydronephrosis visualized. Left Kidney: Renal measurements: 11.5 cm x 5.2 cm x 5.9 cm = volume: 182.6 mL. The left kidney is lobulated in appearance. No abnormal flow is seen within the renal parenchyma on color Doppler evaluation. Echogenicity within normal limits. No mass or hydronephrosis visualized. Bladder: Appears normal for degree of bladder distention. Other: None. IMPRESSION: 1. Lobulated left kidney without ultrasonographic findings that correspond to the indeterminate hypodensities seen within the left kidney on the prior abdomen and pelvis CT. Electronically Signed   By: Virgina Norfolk M.D.   On: 03/28/2021 02:51     Assessment and Plan:   Chest pain: -Happened in the setting of significant emotional stress and physical stress - Symptoms relieved with nitroglycerin - Some atypical features as well, with tenderness lower left ribs - Trop mildly trending up, but this is in the setting of physical stress and a known occluded RCA -At the time of his cath in 2018, he had moderate disease in his D1, CFX, and proximal RCA. -Until these last few days, he has not had chest pain in a very long time -Troponin elevation could be from demand ischemia - We will increase the Imdur 30-> 60 mg qd - Follow-up on echo - Discuss further evaluation with MD  2.  Abdominal pain, N/V - Symptoms have improved, but he still has LUQ Q abdominal tenderness - Possible splenic infarct seen on CT, no renal issues by ultrasound - Management per IM  3.  Chronic systolic CHF: - He is compliant with medications including Entresto 97-103 twice daily, Imdur 30 mg daily, amlodipine 10 mg daily, spironolactone 25 mg daily - Dr. Aundra Dubin had added Coreg 3.125 mg twice daily, but he was not taking it  4.  Chronic anticoagulation - Secondary to a history of LV thrombus - PTA on Eliquis 5 mg twice daily - Eliquis is on hold and he is on IV heparin at this time -Unclear how he could have a splenic  infarct on Eliquis - Discuss compliance with the patient   Risk Assessment/Risk Scores:     HEAR Score (for undifferentiated chest pain):  HEAR Score: 5  New York Heart Association (NYHA) Functional Class NYHA Class II     For questions or updates, please contact [facility] Please consult [email or web address] for contact info under    Signed, Rosaria Ferries, [name]  03/28/2021 11:00 AM

## 2021-03-28 NOTE — Progress Notes (Signed)
I was called about Austin Ingram who is a 60 year old male with hypertension, coronary artery disease, ischemic cardiomyopathy and history of LV thrombus who presented to the emergency department at [facility] with abdominal pain and some chest discomfort.  By the time I was called, the patient was no longer having chest discomfort, however was still having abdominal pain.  He did have a elevated troponin in the 300s that overall were not markedly rising.  Given the story and lack of chest pain, I recommended admission to medicine with repeat echo in the morning to look for any frank abnormalities.  I do not believe from the story or laboratory findings that this is a true MI and I think that he should be worked up for other triggers of his abdominal pain.  However, should his symptoms change or worsen can redirect the plan.  I discussed this with the hospitalist team in the emergency department.  Gerre Scull, MD Cardiology fellow

## 2021-03-28 NOTE — Assessment & Plan Note (Signed)
Renal ultrasound without correlating findings for hypodensity seen on left kidney no CT abdomen/pelvis.

## 2021-03-28 NOTE — Hospital Course (Signed)
Austin Bose Jr. Is a 60 y.o. male with a history of ischemic cardiomyopathy, CAD, LV thrombus, hypertension. Patient presented secondary to abdominal and chest pain and found to have evidence of an NSTEMI. Cardiology consulted. Heparin drip initiated.

## 2021-03-28 NOTE — Progress Notes (Signed)
ANTICOAGULATION CONSULT NOTE   Pharmacy Consult for IV heparin Indication: chest pain/ACS  Allergies  Allergen Reactions   Adhesive [Tape] Other (See Comments)    SKIN SCARRING/IRRITATION. PAPER TAPE IS OKAY    Patient Measurements: Height: 5\' 9"  (175.3 cm) IBW/kg (Calculated) : 70.7 Heparin Dosing Weight: 89.7 kg  Vital Signs: Temp: 99 F (37.2 C) (11/27 1718) BP: 161/99 (11/28 0200) Pulse Rate: 60 (11/28 0200)  Labs: Recent Labs    03/27/21 1726 03/27/21 1945 03/27/21 2334 03/28/21 0247  HGB 13.0  --   --   --   HCT 42.3  --   --   --   PLT 234  --   --   --   APTT  --  28  --  82*  LABPROT  --  14.0  --   --   INR  --  1.1  --   --   HEPARINUNFRC  --  <0.10*  --  0.48  CREATININE 1.10  --   --   --   TROPONINIHS 373* 449* 405*  --      Estimated Creatinine Clearance: 82.3 mL/min (by C-G formula based on SCr of 1.1 mg/dL).   Medical History: Past Medical History:  Diagnosis Date   Allergic rhinitis    Anxiety    Arthritis    "lower back, right thumb, knees" (10/04/2012)   Asthma    "grew out of it" (10/04/2012)   CKD (chronic kidney disease), stage II    Coronary artery disease    a. s/p prior stent to LAD;  b. LHC 6/14: DES to pRCA. c. DES to dCx 01/2017 with residual disease.   Depression    Hyperlipidemia    Hypertension    Ischemic cardiomyopathy    LV (left ventricular) mural thrombus    MI (myocardial infarction) ([facility]) 03/2007   OSA (obstructive sleep apnea)    mild   Pneumonia    "as a child" (10/04/2012)   Sinus bradycardia     Assessment: 60 y/oF with PMH of LV mural thrombus on Apixaban who presents to [facility] ED with chest and flank pain. Troponin elevated. Pharmacy consulted for IV heparin dosing for ACS. Last dose of Apixaban 5mg  PO BID reported per patient as 11/26 at 1800. Hgb, Pltc WNL. Note that recent Apixaban use can falsely elevate heparin level.    Goal of Therapy:  Heparin level 0.3-0.7 units/ml aPTT 66-102 seconds Monitor  platelets by anticoagulation protocol: Yes   Patient reported last Apixaban dose was 11/26 @ 2100 however Baseline Heparin level < 0.1 and Baseline aPTT = 28 sec  03/28/21 Heparin level = 0.48 (therapeutic) with heparin gtt @ 1150 units/hr aPTT = 82 sec (therapeutic) CBC this AM not due to be drawn until 0500 As baseline HL undetectable, and current HL and aPTT correlate will monitor heparin therapy with only heparin levels.  Plan:  Continue heparin infusion at 1150 units/hour Repeat heparin level in 6 hr to confirm therapeutic dose Daily CBC, heparin level Monitor closely for s/sx of bleeding   12/26, PharmD 03/28/2021,4:31 AM
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
## 2021-03-29 ENCOUNTER — Encounter (HOSPITAL_COMMUNITY)
Admission: EM | Disposition: A | Discharge status: Home / Self Care | Payer: Self-pay | Source: Home / Self Care | Attending: Family Medicine

## 2021-03-29 ENCOUNTER — Inpatient Hospital Stay (HOSPITAL_COMMUNITY): Payer: Federal, State, Local not specified - PPO

## 2021-03-29 ENCOUNTER — Encounter (HOSPITAL_COMMUNITY): Payer: Self-pay | Admitting: Cardiology

## 2021-03-29 DIAGNOSIS — I513 Intracardiac thrombosis, not elsewhere classified: Secondary | ICD-10-CM | POA: Diagnosis not present

## 2021-03-29 DIAGNOSIS — I10 Essential (primary) hypertension: Secondary | ICD-10-CM

## 2021-03-29 DIAGNOSIS — I2510 Atherosclerotic heart disease of native coronary artery without angina pectoris: Secondary | ICD-10-CM

## 2021-03-29 DIAGNOSIS — I5022 Chronic systolic (congestive) heart failure: Secondary | ICD-10-CM

## 2021-03-29 HISTORY — PX: LEFT HEART CATH AND CORONARY ANGIOGRAPHY: CATH118249

## 2021-03-29 LAB — ECHOCARDIOGRAM LIMITED
Height: 69 in
Weight: 3056 oz

## 2021-03-29 LAB — BASIC METABOLIC PANEL
Anion gap: 6 (ref 5–15)
BUN: 13 mg/dL (ref 6–20)
CO2: 26 mmol/L (ref 22–32)
Calcium: 8.9 mg/dL (ref 8.9–10.3)
Chloride: 107 mmol/L (ref 98–111)
Creatinine, Ser: 1.16 mg/dL (ref 0.61–1.24)
GFR, Estimated: >60 mL/min (ref >60)
Glucose, Bld: 96 mg/dL (ref 70–99)
Potassium: 3.6 mmol/L (ref 3.5–5.1)
Sodium: 139 mmol/L (ref 135–145)

## 2021-03-29 LAB — CBC
HCT: 41.2 % (ref 39.0–52.0)
Hemoglobin: 12.9 g/dL — ABNORMAL LOW (ref 13.0–17.0)
MCH: 23.2 pg — ABNORMAL LOW (ref 26.0–34.0)
MCHC: 31.3 g/dL (ref 30.0–36.0)
MCV: 74 fL — ABNORMAL LOW (ref 80.0–100.0)
Platelets: 162 10*3/uL (ref 150–400)
RBC: 5.57 MIL/uL (ref 4.22–5.81)
RDW: 15.2 % (ref 11.5–15.5)
WBC: 5.8 10*3/uL (ref 4.0–10.5)
nRBC: 0 % (ref 0.0–0.2)

## 2021-03-29 LAB — ECHOCARDIOGRAM COMPLETE
Area-P 1/2: 2.95 cm2
Calc EF: 41.7 %
Height: 69 in
S' Lateral: 3.5 cm
Single Plane A2C EF: 30.5 %
Single Plane A4C EF: 51.3 %
Weight: 3264 oz

## 2021-03-29 LAB — HEMOGLOBIN A1C
Hgb A1c MFr Bld: 6.1 % — ABNORMAL HIGH (ref 4.8–5.6)
Mean Plasma Glucose: 128 mg/dL

## 2021-03-29 LAB — URINE CULTURE: Culture: <10000 — AB

## 2021-03-29 LAB — HEPARIN LEVEL (UNFRACTIONATED): Heparin Unfractionated: 0.45 IU/mL (ref 0.30–0.70)

## 2021-03-29 SURGERY — LEFT HEART CATH AND CORONARY ANGIOGRAPHY
Anesthesia: LOCAL

## 2021-03-29 MED ORDER — SODIUM CHLORIDE 0.9 % IV SOLN: 250.0000 mL | INTRAVENOUS | Status: DC | PRN

## 2021-03-29 MED ORDER — LIDOCAINE HCL (PF) 1 % IJ SOLN
INTRAMUSCULAR | Status: DC | PRN
  Administered 2021-03-29: 2 mL

## 2021-03-29 MED ORDER — SODIUM CHLORIDE 0.9% FLUSH: 3.0000 mL | INTRAVENOUS | Status: DC | PRN

## 2021-03-29 MED ORDER — HEPARIN SODIUM (PORCINE) 1000 UNIT/ML IJ SOLN
INTRAMUSCULAR | Status: AC
  Filled 2021-03-29: qty 10

## 2021-03-29 MED ORDER — MIDAZOLAM HCL 2 MG/2ML IJ SOLN
INTRAMUSCULAR | Status: DC | PRN
  Administered 2021-03-29: 2 mg via INTRAVENOUS

## 2021-03-29 MED ORDER — ASPIRIN 81 MG PO TBEC: 81.0000 mg | DELAYED_RELEASE_TABLET | Freq: Every day | ORAL | 11 refills | Status: DC

## 2021-03-29 MED ORDER — PERFLUTREN LIPID MICROSPHERE
1.0000 mL | INTRAVENOUS | Status: AC | PRN
  Administered 2021-03-29: 6 mL via INTRAVENOUS
  Filled 2021-03-29: qty 10

## 2021-03-29 MED ORDER — APIXABAN 5 MG PO TABS: 5.0000 mg | ORAL_TABLET | Freq: Two times a day (BID) | ORAL | Status: DC

## 2021-03-29 MED ORDER — FENTANYL CITRATE (PF) 100 MCG/2ML IJ SOLN
INTRAMUSCULAR | Status: AC
  Filled 2021-03-29: qty 2

## 2021-03-29 MED ORDER — SODIUM CHLORIDE 0.9 % WEIGHT BASED INFUSION
1.0000 mL/kg/h | INTRAVENOUS | Status: AC
  Administered 2021-03-29: 1 mL/kg/h via INTRAVENOUS

## 2021-03-29 MED ORDER — LIDOCAINE HCL (PF) 1 % IJ SOLN
INTRAMUSCULAR | Status: AC
  Filled 2021-03-29: qty 30

## 2021-03-29 MED ORDER — SODIUM CHLORIDE 0.9% FLUSH: 3.0000 mL | Freq: Two times a day (BID) | INTRAVENOUS | Status: DC

## 2021-03-29 MED ORDER — HEPARIN SODIUM (PORCINE) 1000 UNIT/ML IJ SOLN
INTRAMUSCULAR | Status: DC | PRN
  Administered 2021-03-29: 4500 [IU] via INTRAVENOUS

## 2021-03-29 MED ORDER — VERAPAMIL HCL 2.5 MG/ML IV SOLN
INTRAVENOUS | Status: AC
  Filled 2021-03-29: qty 2

## 2021-03-29 MED ORDER — FENTANYL CITRATE (PF) 100 MCG/2ML IJ SOLN
INTRAMUSCULAR | Status: DC | PRN
  Administered 2021-03-29: 25 ug via INTRAVENOUS

## 2021-03-29 MED ORDER — VERAPAMIL HCL 2.5 MG/ML IV SOLN
INTRAVENOUS | Status: DC | PRN
  Administered 2021-03-29: 10 mL via INTRA_ARTERIAL

## 2021-03-29 MED ORDER — IOHEXOL 350 MG/ML SOLN
INTRAVENOUS | Status: DC | PRN
  Administered 2021-03-29: 40 mL

## 2021-03-29 MED ORDER — ISOSORBIDE MONONITRATE ER 60 MG PO TB24: 60.0000 mg | ORAL_TABLET | Freq: Every day | ORAL | 1 refills | Status: DC

## 2021-03-29 MED ORDER — HEPARIN (PORCINE) IN NACL 1000-0.9 UT/500ML-% IV SOLN
INTRAVENOUS | Status: DC | PRN
  Administered 2021-03-29 (×3): 500 mL

## 2021-03-29 MED ORDER — MIDAZOLAM HCL 2 MG/2ML IJ SOLN
INTRAMUSCULAR | Status: AC
  Filled 2021-03-29: qty 2

## 2021-03-29 MED ORDER — HYDRALAZINE HCL 20 MG/ML IJ SOLN: 10.0000 mg | INTRAMUSCULAR | Status: AC | PRN

## 2021-03-29 SURGICAL SUPPLY — 9 items
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
## 2021-03-29 NOTE — Interval H&P Note (Signed)
History and Physical Interval Note:  03/29/2021 9:26 AM  Austin Carbo Jr.  has presented today for surgery, with the diagnosis of angina.  The various methods of treatment have been discussed with the patient and family. After consideration of risks, benefits and other options for treatment, the patient has consented to  Procedure(s): LEFT HEART CATH AND CORONARY ANGIOGRAPHY (N/A) as a surgical intervention.  The patient's history has been reviewed, patient examined, no change in status, stable for surgery.  I have reviewed the patient's chart and labs.  Questions were answered to the patient's satisfaction.   Cath Lab Visit (complete for each Cath Lab visit)  Clinical Evaluation Leading to the Procedure:   ACS: Yes.    Non-ACS:    Anginal Classification: CCS IV  Anti-ischemic medical therapy: Maximal Therapy (2 or more classes of medications)  Non-Invasive Test Results: No non-invasive testing performed  Prior CABG: No previous CABG        Theron Arista [facility] 03/29/2021 9:26 AM

## 2021-03-29 NOTE — Progress Notes (Signed)
[place] for IV heparin>>apixaban Indication: LV thrombus  Allergies  Allergen Reactions   Adhesive [Tape] Other (See Comments)    SKIN SCARRING/IRRITATION. PAPER TAPE IS OKAY    Patient Measurements: Height: 5\' 9"  (175.3 cm) Weight: 86.6 kg (191 lb) IBW/kg (Calculated) : 70.7 Heparin Dosing Weight: 89.7 kg  Vital Signs: Temp: 98.4 F (36.9 C) (11/29 1008) Temp Source: Oral (11/29 1008) BP: 128/90 (11/29 1008) Pulse Rate: 51 (11/29 1008)  Labs: Recent Labs    03/27/21 1726 03/27/21 1726 03/27/21 1945 03/27/21 2334 03/28/21 0246 03/28/21 0247 03/28/21 0455 03/28/21 0914 03/28/21 1624 03/29/21 0051 03/29/21 0229 03/29/21 0807  HGB 13.0  --   --   --   --   --  11.9*  --  12.7*  --  12.9*  --   HCT 42.3  --   --   --   --   --  38.0*  --  40.8  --  41.2  --   PLT 234  --   --   --   --   --  197  --  175  --  162  --   APTT  --   --  28  --   --  82*  --   --   --   --   --   --   LABPROT  --   --  14.0  --   --   --   --   --   --   --   --   --   INR  --   --  1.1  --   --   --   --   --   --   --   --   --   HEPARINUNFRC  --    < > <0.10*  --   --  0.48  --  0.34  --  0.45  --   --   CREATININE 1.10  --   --   --   --   --  0.95  --   --   --   --  1.16  TROPONINIHS 373*  --  449*   < > 494*  --   --  530* 345*  --   --   --    < > = values in this interval not displayed.     Estimated Creatinine Clearance: 75.7 mL/min (by C-G formula based on SCr of 1.16 mg/dL).  Medical History: Past Medical History:  Diagnosis Date   Allergic rhinitis    Anxiety    Arthritis    "lower back, right thumb, knees" (10/04/2012)   Asthma    "grew out of it" (10/04/2012)   CKD (chronic kidney disease), stage II    Coronary artery disease    a. s/p prior stent to LAD;  b. LHC 6/14: DES to pRCA. c. DES to dCx 01/2017 with residual disease.   Depression    Hyperlipidemia    Hypertension    Ischemic cardiomyopathy    LV (left  ventricular) mural thrombus    MI (myocardial infarction) ([place]) 03/2007   OSA (obstructive sleep apnea)    mild   Pneumonia    "as a child" (10/04/2012)   Sinus bradycardia     Assessment: 60 y/oF with PMH of LV mural thrombus on Apixaban who presents to [facility] ED with chest and flank pain. Troponin elevated. Pharmacy consulted for IV heparin dosing for  ACS. Last dose of Apixaban 5mg  PO BID reported per patient as 11/26 at 1800. Hgb, Pltc WNL. Note that recent Apixaban use can falsely elevate heparin level.   Pt is s/p cath with no new findings. Ok to resume apixaban this PM per cards and Dr. 12/26.    Goal of Therapy:  Monitor platelets by anticoagulation protocol: Yes  Plan:  Dc heparin Apixaban 5mg  PO BID Rx will follow peripherally  Jacqulyn Bath, PharmD, BCIDP, AAHIVP, CPP Infectious Disease Pharmacist 03/29/2021 10:40 AM

## 2021-03-29 NOTE — Discharge Instructions (Signed)

## 2021-03-29 NOTE — Progress Notes (Signed)
TR band removed. R radial site clean and dry. Will continue to monitor for signs of bleeding. Netta Corrigan, RN

## 2021-03-29 NOTE — Discharge Summary (Signed)
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 5.0 03/28/2021 0246   GLUCOSEU >=500 (A) 03/28/2021 0246   HGBUR NEGATIVE 03/28/2021 0246   BILIRUBINUR NEGATIVE 03/28/2021 0246   KETONESUR 20 (A) 03/28/2021 0246   PROTEINUR NEGATIVE 03/28/2021 0246   NITRITE NEGATIVE 03/28/2021 0246   LEUKOCYTESUR NEGATIVE 03/28/2021 0246   Sepsis Labs Invalid input(s): PROCALCITONIN,  WBC,  LACTICIDVEN Microbiology Recent Results (from the past 240 hour(s))  Resp Panel by RT-PCR (Flu A&B, Covid) Nasopharyngeal Swab     Status: None   Collection Time: 03/27/21  5:26 PM   Specimen: Nasopharyngeal Swab; Nasopharyngeal(NP) swabs in vial transport medium  Result Value Ref Range Status   SARS Coronavirus 2 by RT PCR NEGATIVE NEGATIVE Final    Comment: (NOTE) SARS-CoV-2 target nucleic acids are NOT DETECTED.  The SARS-CoV-2 RNA is generally detectable in upper  respiratory specimens during the acute phase of infection. The lowest concentration of SARS-CoV-2 viral copies this assay can detect is 138 copies/mL. A negative result does not preclude SARS-Cov-2 infection and should not be used as the sole basis for treatment or other patient management decisions. A negative result may occur with  improper specimen collection/handling, submission of specimen other than nasopharyngeal swab, presence of viral mutation(s) within the areas targeted by this assay, and inadequate number of viral copies(<138 copies/mL). A negative result must be combined with clinical observations, patient history, and epidemiological information. The  expected result is Negative.  Fact Sheet for Patients:  BloggerCourse.com  Fact Sheet for Healthcare Providers:  SeriousBroker.it  This test is no t yet approved or cleared by the [place] FDA and  has been authorized for detection and/or diagnosis of SARS-CoV-2 by FDA under an Emergency Use Authorization (EUA). This EUA will remain  in effect (meaning this test can be used) for the duration of the COVID-19 declaration under Section 564(b)(1) of the Act, 21 U.S.C.section 360bbb-3(b)(1), unless the authorization is terminated  or revoked sooner.       Influenza A by PCR NEGATIVE NEGATIVE Final   Influenza B by PCR NEGATIVE NEGATIVE Final    Comment: (NOTE) The Xpert Xpress SARS-CoV-2/FLU/RSV plus assay is intended as an aid in the diagnosis of influenza from Nasopharyngeal swab specimens and should not be used as a sole basis for treatment. Nasal washings and aspirates are unacceptable for Xpert Xpress SARS-CoV-2/FLU/RSV testing.  Fact Sheet for Patients: BloggerCourse.com  Fact Sheet for Healthcare Providers: SeriousBroker.it  This test is not yet approved or cleared by the [place] FDA and has been  authorized for detection and/or diagnosis of SARS-CoV-2 by FDA under an Emergency Use Authorization (EUA). This EUA will remain in effect (meaning this test can be used) for the duration of the COVID-19 declaration under Section 564(b)(1) of the Act, 21 U.S.C. section 360bbb-3(b)(1), unless the authorization is terminated or revoked.  Performed at [facility], [address] [place], [place] [address]   Urine Culture     Status: Abnormal   Collection Time: 03/28/21  2:46 AM   Specimen: Urine, Clean Catch  Result Value Ref Range Status   Specimen Description   Final    URINE, CLEAN CATCH Performed at [facility], [address] [place], [place] [address]    Special Requests   Final    NONE Performed at [facility], [address], [place], [place] [address]    Culture (A)  Final    <10,000 COLONIES/mL INSIGNIFICANT GROWTH Performed at [facility], [address], [place], [place] [address]    Report Status 03/29/2021 FINAL  Final     Time coordinating discharge: Over 30 minutes  SIGNED:   Hughie Closs, MD  [facility] 03/29/2021, 5:30 PM  If 7PM-7AM, please contact night-coverage [email or web address]

## 2021-03-29 NOTE — Progress Notes (Addendum)
Progress Note  Patient Name: Austin Ingram Austin Ingram. Date of Encounter: 03/29/2021  [facility] Cardiologist: Lauree Chandler, MD   Subjective   No acute events overnight. Reviewed results of echo. I personally reviewed and was concerned for LV thrombus. Dr. Harrington Challenger and I discussed, and it is hard to say whether it is thrombus or trabeculation based on the images. Reviewed the repeated images, suggests more likely trabeculation. Reviewed cath results today.   Inpatient Medications    Scheduled Meds:  amLODipine  10 mg Oral Daily   apixaban  5 mg Oral BID   aspirin EC  81 mg Oral Daily   atorvastatin  40 mg Oral Daily   citalopram  20 mg Oral Daily   finasteride  5 mg Oral Daily   isosorbide mononitrate  60 mg Oral Daily   polyethylene glycol  17 g Oral Daily   sacubitril-valsartan  1 tablet Oral BID   sodium chloride flush  3 mL Intravenous Q12H   sodium chloride flush  3 mL Intravenous Q12H   sodium chloride flush  3 mL Intravenous Q12H   spironolactone  25 mg Oral Daily   tamsulosin  0.4 mg Oral Q breakfast   Continuous Infusions:  sodium chloride     sodium chloride     PRN Meds: sodium chloride, sodium chloride, acetaminophen **OR** acetaminophen, albuterol, fentaNYL (SUBLIMAZE) injection, HYDROcodone-acetaminophen, ondansetron **OR** ondansetron (ZOFRAN) IV, sodium chloride flush, sodium chloride flush   Vital Signs    Vitals:   03/29/21 1008 03/29/21 1030 03/29/21 1100 03/29/21 1200  BP: 128/90 (!) 134/104 (!) 127/91 125/88  Pulse: (!) 51     Resp: 18     Temp: 98.4 F (36.9 C)     TempSrc: Oral     SpO2: 99%     Weight:      Height:        Intake/Output Summary (Last 24 hours) at 03/29/2021 1659 Last data filed at 03/29/2021 1500 Gross per 24 hour  Intake 604.68 ml  Output 400 ml  Net 204.68 ml   Last 3 Weights 03/29/2021 03/28/2021 03/28/2021  Weight (lbs) 191 lb 189 lb 6.4 oz 204 lb  Weight (kg) 86.637 kg 85.911 kg 92.534 kg      Telemetry     NSR with intermittent PVCs - Personally Reviewed  ECG    No new since 11/27 - Personally Reviewed  Physical Exam   GEN: No acute distress.   Neck: No JVD Cardiac: RRR, no murmurs, rubs, or gallops.  Respiratory: Clear to auscultation bilaterally. GI: Soft, nontender, non-distended  MS: No edema; No deformity. Neuro:  Nonfocal  Psych: Normal affect   Labs    High Sensitivity Troponin:   Recent Labs  Lab 03/27/21 1945 03/27/21 2334 03/28/21 0246 03/28/21 0914 03/28/21 1624  TROPONINIHS 449* 405* 494* 530* 345*     Chemistry Recent Labs  Lab 03/27/21 1726 03/28/21 0455 03/29/21 0807  NA 139 139 139  K 3.7 3.3* 3.6  CL 108 108 107  CO2 25 24 26   GLUCOSE 128* 90 96  BUN 16 13 13   CREATININE 1.10 0.95 1.16  CALCIUM 8.9 8.5* 8.9  MG  --  2.0  --   PROT 8.0 6.5  --   ALBUMIN 4.5 3.7  --   AST 28 24  --   ALT 18 16  --   ALKPHOS 71 60  --   BILITOT 0.6 0.7  --   GFRNONAA >60 >60 >60  ANIONGAP 6 7  6    Lipids  Recent Labs  Lab 03/28/21 0455  CHOL 110  TRIG 26  HDL 40*  LDLCALC 65  CHOLHDL 2.8    Hematology Recent Labs  Lab 03/28/21 0455 03/28/21 1624 03/29/21 0229  WBC 7.8 7.1 5.8  RBC 5.05 5.50 5.57  HGB 11.9* 12.7* 12.9*  HCT 38.0* 40.8 41.2  MCV 75.2* 74.2* 74.0*  MCH 23.6* 23.1* 23.2*  MCHC 31.3 31.1 31.3  RDW 15.8* 15.5 15.2  PLT 197 175 162   Thyroid  Recent Labs  Lab 03/28/21 0455  TSH 0.472    BNPNo results for input(s): BNP, PROBNP in the last 168 hours.  DDimer No results for input(s): DDIMER in the last 168 hours.   Radiology    DG Chest 2 View  Result Date: 03/27/2021 CLINICAL DATA:  Sternal chest pain and LEFT flank pain, history MI, coronary stenting, hypertension, smoker EXAM: CHEST - 2 VIEW COMPARISON:  03/06/2017 FINDINGS: Borderline enlargement of cardiac silhouette. Mediastinal contours and pulmonary vascularity normal. Lungs clear. No pulmonary infiltrate, pleural effusion, or pneumothorax. Osseous structures  unremarkable. IMPRESSION: No acute abnormalities. Electronically Signed   By: Lavonia Dana M.D.   On: 03/27/2021 19:03   CT ABDOMEN PELVIS W CONTRAST  Result Date: 03/28/2021 CLINICAL DATA:  Diverticulitis, complication suspected. EXAM: CT ABDOMEN AND PELVIS WITH CONTRAST TECHNIQUE: Multidetector CT imaging of the abdomen and pelvis was performed using the standard protocol following bolus administration of intravenous contrast. CONTRAST:  60mL OMNIPAQUE IOHEXOL 350 MG/ML SOLN COMPARISON:  12/08/2002. FINDINGS: Lower chest: The heart is normal in size and coronary artery calcifications are noted. Minimal atelectasis is present at the lung bases. Hepatobiliary: A subcentimeter hypodensity is present in the left lobe of the liver which is too small to further characterize. No biliary ductal dilatation. The gallbladder is without stones. Pancreas: Unremarkable. No pancreatic ductal dilatation or surrounding inflammatory changes. Spleen: A wedge-shaped hypodensity is present in the posterior aspect of the spleen suggesting splenic infarcts of indeterminate age. Calcified granuloma are noted in the spleen. Adrenals/Urinary Tract: No adrenal nodule or mass. No renal calculus or hydronephrosis bilaterally. Hypodensities are present in the left kidney which can not be further characterized on this exam, the largest measuring 1.4 cm. The bladder is unremarkable. Stomach/Bowel: The stomach is unremarkable. No bowel obstruction, free air, or pneumatosis. A normal appendix is seen in the right lower quadrant. A few scattered diverticula are noted along the colon without evidence of diverticulitis. There is fatty infiltration of the walls of the descending colon suggesting chronic inflammatory changes. Vascular/Lymphatic: Aortic atherosclerosis. No enlarged abdominal or pelvic lymph nodes. Reproductive: The prostate gland is normal in size. Other: Fat containing inguinal hernias are present bilaterally. There is a fat  containing umbilical hernia. No free fluid. Musculoskeletal: No acute osseous abnormality. IMPRESSION: 1. Diverticulosis without diverticulitis. 2. Wedge shaped hypodensity in the posterior aspect of the spleen, possible infarct of indeterminate age. Findings may also be associated with laceration in the setting of trauma. 3. Coronary artery calcifications. 4. Indeterminate hypodensities in left kidney. Ultrasound is recommended for further evaluation. 5. Remaining chronic findings as described above. Electronically Signed   By: Brett Fairy M.D.   On: 03/28/2021 02:06   CARDIAC CATHETERIZATION  Result Date: 03/29/2021   Ost RCA to Prox RCA lesion is 40% stenosed.   Prox RCA lesion is 50% stenosed.   RV Branch lesion is 80% stenosed.   Prox RCA to Mid RCA lesion is 100% stenosed.   Prox LAD  lesion is 25% stenosed.   Mid Cx to Dist Cx lesion is 10% stenosed.   1st Sept lesion is 80% stenosed.   LV end diastolic pressure is normal. Chronic occlusion of the mid RCA with good left to right and right to right collaterals. Patent stents in the proximal LAD, distal LCx and proximal RCA. Normal LVEDP 7 mm Hg Plan: no new disease to explain his clinical scenario. Recommend continued medical therapy. OK to resume Eliquis this PM.   US RENAL  Result Date: 03/28/2021 CLINICAL DATA:  Abnormal findings on recent abdomen pelvis CT. EXAM: RENAL / URINARY TRACT ULTRASOUND COMPLETE COMPARISON:  Abdomen pelvis CT, dated March 28, 2021. FINDINGS: Right Kidney: Renal measurements: 10.1 cm x 4.9 cm x 4.9 cm = volume: 125.4 mL. Echogenicity within normal limits. No mass or hydronephrosis visualized. Left Kidney: Renal measurements: 11.5 cm x 5.2 cm x 5.9 cm = volume: 182.6 mL. The left kidney is lobulated in appearance. No abnormal flow is seen within the renal parenchyma on color Doppler evaluation. Echogenicity within normal limits. No mass or hydronephrosis visualized. Bladder: Appears normal for degree of bladder  distention. Other: None. IMPRESSION: 1. Lobulated left kidney without ultrasonographic findings that correspond to the indeterminate hypodensities seen within the left kidney on the prior abdomen and pelvis CT. Electronically Signed   By: Aram Candela M.D.   On: 03/28/2021 02:51   ECHOCARDIOGRAM COMPLETE  Result Date: 03/29/2021    ECHOCARDIOGRAM REPORT   Patient Name:   [facility] Montez Hageman. Date of Exam: 03/28/2021 Medical Rec #:  [id number]       Height:       69.0 in Accession #:    [id number]      Weight:       204.0 lb Date of Birth:  Nov 16, 1960       BSA:          2.083 m Patient Age:    60 years        BP:           150/92 mmHg Patient Gender: M               HR:           62 bpm. Exam Location:  Inpatient Procedure: 2D Echo, Cardiac Doppler, Color Doppler and Intracardiac            Opacification Agent                                MODIFIED REPORT:      This report was modified by Dietrich Pates MD on 03/29/2021 due to Modify                                   impression.  Indications:     122-I22.9 Subsequent ST elevation (STEM) and non-ST elevation                  (NSTEMI) myocardial infarction  History:         Patient has prior history of Echocardiogram examinations, most                  recent 08/16/2020. CAD and Previous Myocardial Infarction,                  Signs/Symptoms:Chest Pain; Risk Factors:Dyslipidemia, Sleep  Apnea and Hypertension.  Sonographer:     Roseanna Rainbow RDCS Referring Phys:  [place] Diagnosing Phys: Dorris Carnes MD  Sonographer Comments: Technically difficult study due to poor echo windows. IMPRESSIONS  1. Hypokinesis of the inferoseptal, inferor, distal lateral walls; apical akinesis. . Left ventricular ejection fraction, by estimation, is 30%%. There is severe asymmetric left ventricular hypertrophy. Left ventricular diastolic parameters are consistent with Grade I diastolic dysfunction (impaired relaxation).  2. With Definity, prominent trabeculations  seen at apex, cannot completely exclude thrombus. Consider f/u evaluations over time  3. Right ventricular systolic function is low normal. The right ventricular size is normal.  4. The mitral valve is normal in structure. No evidence of mitral valve regurgitation.  5. The aortic valve is normal in structure. Aortic valve regurgitation is not visualized.  6. The inferior vena cava is normal in size with greater than 50% respiratory variability, suggesting right atrial pressure of 3 mmHg. Comparison(s): The left ventricular function is unchanged. FINDINGS  Left Ventricle: Hypokinesis of the inferoseptal, inferor, distal lateral walls; apical akinesis. Left ventricular ejection fraction, by estimation, is 30%%. Definity contrast agent was given IV to delineate the left ventricular endocardial borders. The left ventricular internal cavity size was normal in size. There is severe asymmetric left ventricular hypertrophy. Left ventricular diastolic parameters are consistent with Grade I diastolic dysfunction (impaired relaxation). Right Ventricle: The right ventricular size is normal. Right vetricular wall thickness was not assessed. Right ventricular systolic function is low normal. Left Atrium: Left atrial size was normal in size. Right Atrium: Right atrial size was normal in size. Pericardium: There is no evidence of pericardial effusion. Mitral Valve: The mitral valve is normal in structure. No evidence of mitral valve regurgitation. Tricuspid Valve: The tricuspid valve is normal in structure. Tricuspid valve regurgitation is trivial. Aortic Valve: The aortic valve is normal in structure. Aortic valve regurgitation is not visualized. Pulmonic Valve: The pulmonic valve was normal in structure. Pulmonic valve regurgitation is trivial. Aorta: The aortic root and ascending aorta are structurally normal, with no evidence of dilitation. Venous: The inferior vena cava is normal in size with greater than 50% respiratory  variability, suggesting right atrial pressure of 3 mmHg. IAS/Shunts: No atrial level shunt detected by color flow Doppler.  LEFT VENTRICLE PLAX 2D LVIDd:         4.70 cm      Diastology LVIDs:         3.50 cm      LV e' medial:    3.90 cm/s LV PW:         1.50 cm      LV E/e' medial:  15.6 LV IVS:        1.80 cm      LV e' lateral:   3.83 cm/s LVOT diam:     2.10 cm      LV E/e' lateral: 15.9 LV SV:         51 LV SV Index:   24 LVOT Area:     3.46 cm  LV Volumes (MOD) LV vol d, MOD A2C: 131.0 ml LV vol d, MOD A4C: 224.0 ml LV vol s, MOD A2C: 91.1 ml LV vol s, MOD A4C: 109.0 ml LV SV MOD A2C:     39.9 ml LV SV MOD A4C:     224.0 ml LV SV MOD BP:      72.5 ml RIGHT VENTRICLE             IVC  RV S prime:     19.50 cm/s  IVC diam: 2.50 cm LEFT ATRIUM             Index        RIGHT ATRIUM           Index LA diam:        3.20 cm 1.54 cm/m   RA Area:     12.80 cm LA Vol (A2C):   34.0 ml 16.32 ml/m  RA Volume:   30.40 ml  14.59 ml/m LA Vol (A4C):   40.7 ml 19.53 ml/m LA Biplane Vol: 38.4 ml 18.43 ml/m  AORTIC VALVE LVOT Vmax:   84.90 cm/s LVOT Vmean:  58.500 cm/s LVOT VTI:    0.146 m  AORTA Ao Root diam: 3.70 cm Ao Asc diam:  3.30 cm MITRAL VALVE MV Area (PHT): 2.95 cm    SHUNTS MV Decel Time: 257 msec    Systemic VTI:  0.15 m MV E velocity: 60.80 cm/s  Systemic Diam: 2.10 cm MV A velocity: 83.60 cm/s MV E/A ratio:  0.73 Dorris Carnes MD Electronically signed by Dorris Carnes MD Signature Date/Time: 03/28/2021/4:55:09 PM    Final (Updated)    ECHOCARDIOGRAM LIMITED  Result Date: 03/29/2021    ECHOCARDIOGRAM LIMITED REPORT   Patient Name:   [facility] Date of Exam: 03/29/2021 Medical Rec #:  [id number]       Height:       69.0 in Accession #:    [id number]      Weight:       191.0 lb Date of Birth:  August 13, 1960       BSA:          2.026 m Patient Age:    76 years        BP:           125/88 mmHg Patient Gender: M               HR:           56 bpm. Exam Location:  Inpatient Procedure: Limited Echo and Intracardiac  Opacification Agent Indications:    Thrombus in heart chamber [id number]  History:        Patient has prior history of Echocardiogram examinations, most                 recent 03/28/2021. CAD and Previous Myocardial Infarction,                 Arrythmias:Bradycardia; Risk Factors:Hypertension and                 Dyslipidemia. Chronic kidney disease.  Sonographer:    Darlina Sicilian RDCS Referring Phys: [id number] [place]  1. No definitive LV thrombus on contrast imaging. Appears to be trabeculation in the LV apex. WMA consistent with prior LAD infarction. Left ventricular ejection fraction, by estimation, is 30 to 35%. The left ventricle has moderately decreased function. The left ventricle demonstrates regional wall motion abnormalities (see scoring diagram/findings for description).  2. Right ventricular systolic function is normal. The right ventricular size is normal. FINDINGS  Left Ventricle: No definitive LV thrombus on contrast imaging. Appears to be trabeculation in the LV apex. WMA consistent with prior LAD infarction. Left ventricular ejection fraction, by estimation, is 30 to 35%. The left ventricle has moderately decreased function. The left ventricle demonstrates regional wall motion abnormalities. Definity contrast agent was given IV to delineate the left ventricular endocardial borders.  LV Wall Scoring:  The apical septal segment, apical anterior segment, apical inferior segment, and apex are akinetic. Right Ventricle: The right ventricular size is normal. No increase in right ventricular wall thickness. Right ventricular systolic function is normal. Pericardium: Trivial pericardial effusion is present. Presence of epicardial fat layer. Eleonore Chiquito MD Electronically signed by Eleonore Chiquito MD Signature Date/Time: 03/29/2021/1:11:51 PM    Final     Cardiac Studies   Echo 03/28/21  1. Hypokinesis of the inferoseptal, inferor, distal lateral walls; apical  akinesis. . Left  ventricular ejection fraction, by estimation, is 30%%.  There is severe asymmetric left ventricular hypertrophy. Left ventricular  diastolic parameters are  consistent with Grade I diastolic dysfunction (impaired relaxation).   2. With Definity, prominent trabeculations seen at apex, cannot  completely exclude thrombus. Consider f/u evaluations over time   3. Right ventricular systolic function is low normal. The right  ventricular size is normal.   4. The mitral valve is normal in structure. No evidence of mitral valve  regurgitation.   5. The aortic valve is normal in structure. Aortic valve regurgitation is  not visualized.   6. The inferior vena cava is normal in size with greater than 50%  respiratory variability, suggesting right atrial pressure of 3 mmHg.   Comparison(s): The left ventricular function is unchanged.   Patient Profile     60 y.o. male with PMH CAD with prior PCIs, ischemic cardiomyopathy, history of LV thrombus now on apixaban, hypertension, hyperlipidemia, sinus bradycardia who presented with two episodes of chest pain. Cardiology consulted for possible NSTEMI.  Assessment & Plan    CAD Chest pain Elevated troponins, peak 530 and downtrending Hyperlipidemia -suggestive of NSTEMI -echo with unchanged function/wall motion -I am concerned about LV thrombus on echo, see below. However repeat images appear more consistent with trabeculation -no change on his angiography -continue aspirin, imdur -continue atorvastatin -added aspirin back (recently held given apixaban and no recent interventions). Would continue at discharge  Ischemic cardiomyopathy History of LV thrombus, on apixaban Splenic infarct, age indeterminate Hypertension -no beta blocker given bradycardia (was not taking carvedilol at home) -continue entresto 97-103 mg twice daily  -imdur as above -on farxiga -continue amlodipine, spironolactone -reports adherence with apixaban at home. Would  restart post cath  [facility] will sign off.   Medication Recommendations:    Aspirin 81 mg daily (restart at discharge) Amlodipine 10 mg daily Apixaban 5 mg twice daily Atorvastatin 40 mg daily Dapagliflozin 10 mg daily Entresto 97-103 mg BID Hydralazine 25 mg TID Imdur 60 mg daily (increase from prior) Spironolactone 25 mg daily  Would NOT restart carvedilol at discharge  Other recommendations (labs, testing, etc):  none Follow up as an outpatient:  Keep follow up with Dr. Aundra Dubin on 05/23/21   For questions or updates, please contact [facility] Please consult [email or web address] for contact info under        Signed, Buford Dresser, MD  03/29/2021, 4:59 PM

## 2021-03-31 NOTE — Progress Notes (Signed)
PCP: Clovis Riley, L.[name], MD Cardiology: Dr. Clifton James HF Cardiology: Dr. Shirlee Latch  HPI:    60 y.o. with history of HTN, ischemic cardiomyopathy, CAD, and LV thrombus was referred by Dr. Clifton James for evaluation of CHF.  Patient had initial PCI in 2004 to proximal RCA.  In 2008, had had anterior MI with BMS to LAD.  In 01/2017, he had DES to distal LCx (RCA noted to be chronically occluded).  He has chronic systolic CHF and has been noted to have an LV thrombus in the past.  Most recent echo in 07/2020 showed EF 30-35%, normal RV.   Echo 03/29/21 EF 30%. Right ventricular systolic function is low normal. The right  ventricular size is normal.   At clinic visit 03/21/21 Patient was doing well symptomatically.  He had mild dyspnea walking fast up stairs, no dyspnea walking on flat ground. He reported that he walks for exercise.  No orthopnea/PND. No chest pain.  No palpitations.  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     He presented to the ED on 03/27/21 secondary to abdominal and chest pain and was found to have evidence of an NSTEMI. He was transferred to [facility], underwent cardiac catheterization 03/29/21 with no changes in findings compared to 2018. He was managed medically.  Carvedilol was discontinued on discharge due to bradycardia. The rest of his heart failure medications remained the same except Imdur was increased to 60 mg daily. Aspirin was restarted, now on dual therapy with aspirin and Eliquis.   Today he returns to HF clinic for pharmacist medication titration. At last visit with MD hydralazine 25 mg TID and carvedilol 3.125 mg BID were initiated. Atorvastatin was increased to 40 mg daily. As above, carvedilol was discontinued due to bradycardia during NSTEMI admission. Overall he is feeling well today. No dizziness, lightheadedness, chest pain or palpitations. Has been more fatigued since  recent admission. Gets SOB with moderate activity or when going up stairs or hills. Weight has been stable, 203 lbs. Does not take any loop diuretic. No LEE, PND or orthopnea.   HF Medications: Entresto 97/103 mg BID Spironolactone 25 mg daily Farxiga 10 mg daily Hydralazine 25 mg TID Imdur 60 mg daily  Has the patient been experiencing any side effects to the medications prescribed?  no  Does the patient have any problems obtaining medications due to transportation or finances?   No; [employer]  Understanding of regimen: good Understanding of indications: good Potential of compliance: good Patient understands to avoid NSAIDs. Patient understands to avoid decongestants.    Pertinent Lab Values: 03/29/21: Serum creatinine 1.16, BUN 13, Potassium 3.6, Sodium 139  Vital Signs: Weight: 203.2 lbs (last clinic weight: 204.6 lbs) Blood pressure: 150/64  Heart rate: 48   Assessment/Plan: 1. CAD: No recent chest pain.  Has history of anterior MI in 2008. Has CTO RCA known from 01/2017 cath. Recent NSTEMI 03/27/21, managed medically.   -Continue aspirin given recent NSTEMI. - Continue atorvastatin 40 mg daily. Lipids/LFTs in 2 months.  2. Chronic systolic CHF: Ischemic cardiomyopathy.  Echo in 03/2021 with EF 30-35% and peri-apical wall motion abnormalities.  - Carvedilol recently discontinued due to bradycardia - Continue Entresto 97/103 mg BID.  - Increase hydralazine to 50 mg TID and continue Imdur 60 mg daily.                           -  Continue dapagliflozin 10 mg daily.  - Continue spironolactone 25 mg daily.   - Repeat echo in 3 months to see if EF has improved further.  If EF remains low, he will need an ICD (not CRT candidate with narrow QRS).  3. LV thrombus: Present in the past. Not seen on limited echo 03/29/21.  - Continue Eliquis.   Follow up 6 weeks with Dr. Jonn Shingles, PharmD, BCPS, [facility], CPP Heart Failure Clinic Pharmacist [phone]

## 2021-04-07 DIAGNOSIS — I2510 Atherosclerotic heart disease of native coronary artery without angina pectoris: Secondary | ICD-10-CM | POA: Diagnosis not present

## 2021-04-07 DIAGNOSIS — I509 Heart failure, unspecified: Secondary | ICD-10-CM | POA: Diagnosis not present

## 2021-04-07 DIAGNOSIS — I10 Essential (primary) hypertension: Secondary | ICD-10-CM | POA: Diagnosis not present

## 2021-04-07 DIAGNOSIS — R7309 Other abnormal glucose: Secondary | ICD-10-CM | POA: Diagnosis not present

## 2021-04-11 ENCOUNTER — Ambulatory Visit (HOSPITAL_COMMUNITY)
Admission: RE | Admit: 2021-04-11 | Discharge: 2021-04-11 | Disposition: A | Discharge status: Home / Self Care | Payer: Federal, State, Local not specified - PPO | Source: Ambulatory Visit | Attending: Internal Medicine | Admitting: Internal Medicine

## 2021-04-11 ENCOUNTER — Other Ambulatory Visit: Payer: Self-pay

## 2021-04-11 VITALS — BP 150/64 | HR 48 | Wt 203.2 lb

## 2021-04-11 DIAGNOSIS — I5022 Chronic systolic (congestive) heart failure: Secondary | ICD-10-CM

## 2021-04-11 DIAGNOSIS — Z86718 Personal history of other venous thrombosis and embolism: Secondary | ICD-10-CM | POA: Diagnosis not present

## 2021-04-11 DIAGNOSIS — Z79899 Other long term (current) drug therapy: Secondary | ICD-10-CM | POA: Insufficient documentation

## 2021-04-11 DIAGNOSIS — I2510 Atherosclerotic heart disease of native coronary artery without angina pectoris: Secondary | ICD-10-CM | POA: Insufficient documentation

## 2021-04-11 DIAGNOSIS — I255 Ischemic cardiomyopathy: Secondary | ICD-10-CM | POA: Insufficient documentation

## 2021-04-11 DIAGNOSIS — I252 Old myocardial infarction: Secondary | ICD-10-CM | POA: Diagnosis not present

## 2021-04-11 DIAGNOSIS — Z7901 Long term (current) use of anticoagulants: Secondary | ICD-10-CM | POA: Diagnosis not present

## 2021-04-11 DIAGNOSIS — I110 Hypertensive heart disease with heart failure: Secondary | ICD-10-CM | POA: Insufficient documentation

## 2021-04-11 MED ORDER — HYDRALAZINE HCL 50 MG PO TABS: 50.0000 mg | ORAL_TABLET | Freq: Three times a day (TID) | ORAL | 11 refills | Status: DC

## 2021-04-11 NOTE — Patient Instructions (Signed)
It was a pleasure seeing you today!  MEDICATIONS: -We are changing your medications today -Increase hydralazine to 50 mg (1 tablet) three times daily -Call if you have questions about your medications.   NEXT APPOINTMENT: Return to clinic in 6 weeks with Dr. Shirlee Latch.  In general, to take care of your heart failure: -Limit your fluid intake to 2 Liters (half-gallon) per day.   -Limit your salt intake to ideally 2-3 grams (2000-3000 mg) per day. -Weigh yourself daily and record, and bring that "weight diary" to your next appointment.  (Weight gain of 2-3 pounds in 1 day typically means fluid weight.) -The medications for your heart are to help your heart and help you live longer.   -Please contact us before stopping any of your heart medications.  Call the clinic at [phone] with questions or to reschedule future appointments.

## 2021-04-22 ENCOUNTER — Other Ambulatory Visit (HOSPITAL_COMMUNITY): Payer: Self-pay | Admitting: Cardiology

## 2021-04-29 ENCOUNTER — Other Ambulatory Visit: Payer: Self-pay | Admitting: Cardiovascular Disease

## 2021-05-20 ENCOUNTER — Encounter (HOSPITAL_COMMUNITY): Payer: Self-pay

## 2021-05-20 NOTE — Progress Notes (Unsigned)
Vm left for patient as a reminder for appointment on January 23,2023 @10 :40

## 2021-05-23 ENCOUNTER — Encounter (HOSPITAL_COMMUNITY): Payer: Self-pay | Admitting: Cardiology

## 2021-05-23 ENCOUNTER — Other Ambulatory Visit: Payer: Self-pay

## 2021-05-23 ENCOUNTER — Ambulatory Visit (HOSPITAL_COMMUNITY)
Admission: RE | Admit: 2021-05-23 | Discharge: 2021-05-23 | Disposition: A | Discharge status: Home / Self Care | Payer: Federal, State, Local not specified - PPO | Source: Ambulatory Visit | Attending: Cardiology | Admitting: Cardiology

## 2021-05-23 VITALS — BP 146/98 | HR 57 | Wt 203.4 lb

## 2021-05-23 DIAGNOSIS — I252 Old myocardial infarction: Secondary | ICD-10-CM | POA: Diagnosis not present

## 2021-05-23 DIAGNOSIS — I255 Ischemic cardiomyopathy: Secondary | ICD-10-CM | POA: Diagnosis not present

## 2021-05-23 DIAGNOSIS — Z955 Presence of coronary angioplasty implant and graft: Secondary | ICD-10-CM | POA: Diagnosis not present

## 2021-05-23 DIAGNOSIS — I2510 Atherosclerotic heart disease of native coronary artery without angina pectoris: Secondary | ICD-10-CM | POA: Insufficient documentation

## 2021-05-23 DIAGNOSIS — I5022 Chronic systolic (congestive) heart failure: Secondary | ICD-10-CM | POA: Diagnosis not present

## 2021-05-23 DIAGNOSIS — I110 Hypertensive heart disease with heart failure: Secondary | ICD-10-CM | POA: Insufficient documentation

## 2021-05-23 DIAGNOSIS — Z79899 Other long term (current) drug therapy: Secondary | ICD-10-CM | POA: Diagnosis not present

## 2021-05-23 DIAGNOSIS — Z7901 Long term (current) use of anticoagulants: Secondary | ICD-10-CM | POA: Diagnosis not present

## 2021-05-23 LAB — BASIC METABOLIC PANEL
Anion gap: 6 (ref 5–15)
BUN: 14 mg/dL (ref 6–20)
CO2: 26 mmol/L (ref 22–32)
Calcium: 8.8 mg/dL — ABNORMAL LOW (ref 8.9–10.3)
Chloride: 110 mmol/L (ref 98–111)
Creatinine, Ser: 1.21 mg/dL (ref 0.61–1.24)
GFR, Estimated: >60 mL/min (ref >60)
Glucose, Bld: 90 mg/dL (ref 70–99)
Potassium: 3.8 mmol/L (ref 3.5–5.1)
Sodium: 142 mmol/L (ref 135–145)

## 2021-05-23 LAB — LIPID PANEL
Cholesterol: 121 mg/dL (ref 0–200)
HDL: 43 mg/dL (ref >40)
LDL Cholesterol: 70 mg/dL (ref 0–99)
Total CHOL/HDL Ratio: 2.8 RATIO
Triglycerides: 38 mg/dL (ref <150)
VLDL: 8 mg/dL (ref 0–40)

## 2021-05-23 NOTE — Patient Instructions (Signed)
Medication Changes:  No changes in medications  Lab Work:  Labs done today, your results will be available in MyChart, we will contact you for abnormal readings.   Testing/Procedures:  Your physician has requested that you have an echocardiogram. Echocardiography is a painless test that uses sound waves to create images of your heart. It provides your doctor with information about the size and shape of your heart and how well your hearts chambers and valves are working. This procedure takes approximately one hour. There are no restrictions for this procedure.   Referrals:  none  Special Instructions // Education:  none  Follow-Up in: 3 months with the clinic and 6 months with Dr. Aundra Dubin (July 2023) ** Call office in May to make appointment**  At the [facility], you and your health needs are our priority. We have a designated team specialized in the treatment of Heart Failure. This Care Team includes your primary Heart Failure Specialized Cardiologist (physician), Advanced Practice Providers (APPs- Physician Assistants and Nurse Practitioners), and Pharmacist who all work together to provide you with the care you need, when you need it.   You may see any of the following providers on your designated Care Team at your next follow up:  Dr Glori Bickers Dr Haynes Kerns, NP Lyda Jester, [place] [facility] [place], [place] Audry Riles, PharmD   Please be sure to bring in all your medications bottles to every appointment.   Need to Contact us:  If you have any questions or concerns before your next appointment please send [place] a message through [place] or call our office at [phone].    TO LEAVE A MESSAGE FOR THE NURSE SELECT OPTION 2, PLEASE LEAVE A MESSAGE INCLUDING: YOUR NAME DATE OF BIRTH CALL BACK NUMBER REASON FOR CALL**this is important as we prioritize the call backs  YOU WILL RECEIVE A CALL BACK THE SAME DAY AS LONG AS  YOU CALL BEFORE 4:00 PM

## 2021-05-23 NOTE — Progress Notes (Signed)
PCP: Clovis Riley, L.[name], MD Cardiology: Dr. Clifton James HF Cardiology: Dr. Shirlee Latch  61 y.o. with history of HTN, ischemic cardiomyopathy, CAD, and LV thrombus was referred by Dr. Clifton James for evaluation of CHF.  Patient had initial PCI in 2004 to proximal RCA.  In 2008, had had anterior MI with BMS to LAD.  In 10/18, he had DES to distal LCx (RCA noted to be chronically occluded).  He has chronic systolic CHF and has been noted to have an LV thrombus in the past.  Echo in 4/22 showed EF 30-35%, normal RV.   He was admitted with chest pain in 11/22, this showed chronic total occlusion of RCA with collaterals, patent stents in the LAD, LCx, proximal RCA.  Echo in 11/22 showed EF 30-35%, normal RV size and systolic function, no LV thrombus.   Patient returns for followup of CHF.  He has had no chest pain.  He has been walking a lot, no dyspnea with walks.  He can climb a flight of stairs without problems.  No lightheadedness.  No BRBPR/melena. No orthopnea/PND.  Weight is stable. BP is elevated today but he has not taken his morning meds (has not eaten yet).  SBP 120s-130s when he checks at home. He does note some "indigestion" when he takes hydralazine.   ECG (personally reviewed): NSR, old ASMI.   Labs (10/22): LDL 84, HDl 42, K 4.4, creatinine 2.45 Labs (12/22): K 4.3, creatinine 1.33  PMH:  1. HTN 2. Hyperlipidemia 3. H/o LV thrombus 4. CAD:  - DES to proximal RCA in 2004 - Anterior MI in 2008 with BMS to LAD.  - DES to distal LCx in 10/18, also noted to have CTO RCA.  - Cardiolite (4/22): EF 31%, peri-apical fixed defect with no ischemia.  - Echo (11/22): Chronic total occlusion of RCA with collaterals, patent stents in the LAD, LCx, proximal RCA 5. Chronic systolic CHF: Ischemic cardiomyopathy.   - Echo (11/20): EF 40-45%, apical thrombus.  - Echo (6/21): EF 35-40%, normal RV - Echo (4/22): EF 30-35%, LAD territory WMAs, normal RV.  - Echo (11/22): EF 30-35%, normal RV size and systolic  function, no LV thrombus.  SH: Married, retired from the post office, occasional marijuana, occasional ETOH, remote smoker.   Family History  Problem Relation Age of Onset   Atrial fibrillation Mother        irregular heart beats   Hypertension Mother    Diabetes type II Mother    CAD Maternal Grandfather    CAD Maternal Grandmother    ROS: All systems reviewed and negative except as per HPI.   Current Outpatient Medications  Medication Sig Dispense Refill   acetaminophen (TYLENOL) 325 MG tablet Take 1 tablet by mouth every 6 (six) hours as needed for moderate pain.     albuterol (VENTOLIN HFA) 108 (90 Base) MCG/ACT inhaler Inhale 2 puffs into the lungs every 4 (four) hours as needed (for cough).     amLODipine (NORVASC) 10 MG tablet Take 10 mg by mouth daily.     aspirin EC 81 MG EC tablet Take 1 tablet (81 mg total) by mouth daily. Swallow whole. 30 tablet 11   atorvastatin (LIPITOR) 40 MG tablet Take 1 tablet (40 mg total) by mouth daily. 90 tablet 3   Azelastine HCl 137 MCG/SPRAY SOLN Place 1 spray into both nostrils 2 (two) times daily. As needed     citalopram (CELEXA) 20 MG tablet Take 1 tablet by mouth daily.     dapagliflozin propanediol (  FARXIGA) 10 MG TABS tablet Take 1 tablet (10 mg total) by mouth daily before breakfast. 30 tablet 11   ELIQUIS 5 MG TABS tablet TAKE 1 TABLET BY MOUTH TWICE A DAY (Patient taking differently: Take 5 mg by mouth 2 (two) times daily.) 60 tablet 5   ENTRESTO 97-103 MG TAKE 1 TABLET BY MOUTH TWICE A DAY (Patient taking differently: Take by mouth 2 (two) times daily.) 180 tablet 2   finasteride (PROSCAR) 5 MG tablet Take 5 mg by mouth daily.     hydrALAZINE (APRESOLINE) 50 MG tablet Take 1 tablet (50 mg total) by mouth 3 (three) times daily. 90 tablet 11   isosorbide mononitrate (IMDUR) 60 MG 24 hr tablet Take 1 tablet (60 mg total) by mouth daily. 30 tablet 1   nabumetone (RELAFEN) 500 MG tablet Take 500 mg by mouth daily.     spironolactone  (ALDACTONE) 25 MG tablet Take 1 tablet (25 mg total) by mouth daily. 90 tablet 3   tamsulosin (FLOMAX) 0.4 MG CAPS capsule Take 0.4 mg by mouth daily with breakfast.      nitroGLYCERIN (NITROSTAT) 0.4 MG SL tablet PLACE 1 TABLET UNDER THE TONGUE EVERY 5 MINUTES AS NEEDED FOR CHEST PAIN. PLEASE SCHEDULE YEARLY APPOINTMENT FOR FUTURE REFILLS. THANK YOU (Patient not taking: Reported on 05/23/2021) 75 tablet 0   No current facility-administered medications for this encounter.   Facility-Administered Medications Ordered in Other Encounters  Medication Dose Route Frequency Provider Last Rate Last Admin   regadenoson (LEXISCAN) injection SOLN 0.4 mg  0.4 mg Intravenous Once Lewayne Bunting, MD       technetium tetrofosmin (TC-MYOVIEW) injection 30.9 millicurie  30.9 millicurie Intravenous Once PRN Lewayne Bunting, MD       BP (!) 146/98    Pulse (!) 57    Wt 92.3 kg (203 lb 6.4 oz)    SpO2 98%    BMI 30.04 kg/m  General: NAD Neck: No JVD, no thyromegaly or thyroid nodule.  Lungs: Clear to auscultation bilaterally with normal respiratory effort. CV: Nondisplaced PMI.  Heart regular S1/S2, no S3/S4, no murmur.  No peripheral edema.  No carotid bruit.  Normal pedal pulses.  Abdomen: Soft, nontender, no hepatosplenomegaly, no distention.  Skin: Intact without lesions or rashes.  Neurologic: Alert and oriented x 3.  Psych: Normal affect. Extremities: No clubbing or cyanosis.  HEENT: Normal.   Assessment/Plan: 1. CAD: No recent chest pain.  Has history of anterior MI in 2008. Has CTO RCA known from 10/18 cath.  Repeat cath for chest pain in 11/22 showed chronic total occlusion of RCA with collaterals, patent stents in the LAD, LCx, proximal RCA.  No further chest pain.  - Given Eliquis use, he is not on ASA.  - Continue atorvastatin, check lipids/LFTs today.   2. Chronic systolic CHF: Ischemic cardiomyopathy.  Echo in 11/22 with EF 30-35% and peri-apical wall motion abnormalities. NYHA class I-II,  not volume overloaded on exam.  - Continue hydralazine 25 tid and Imdur 30 mg daily, will not increase hydralazine as he gets "indigestion" when he takes it.  Would try over the counter pantoprazole to see if this helps.               - Continue dapagliflozin 10 mg daily.  - Continue spironolactone 25 daily.  BMET today.  - Continue Entresto 97/103 bid.  - HR 50s, think in the future he could probably take low dose Coreg 3.125 mg bid.  - EF remains <  35%, narrow QRS.  ICD candidate but not CRT candidate.  We had a long discussion today about an ICD, he is not interested at this time.  Will repeat echo in 6 months and re-discuss ICD.   3. LV thrombus: Present in the past.  Continue Eliquis.   Followup 3 months with APP and 6 months with me with echo.   Marca Ancona 05/23/2021

## 2021-06-19 ENCOUNTER — Other Ambulatory Visit: Payer: Self-pay | Admitting: Physician Assistant

## 2021-07-04 DIAGNOSIS — F329 Major depressive disorder, single episode, unspecified: Secondary | ICD-10-CM | POA: Diagnosis not present

## 2021-07-04 DIAGNOSIS — R45851 Suicidal ideations: Secondary | ICD-10-CM | POA: Diagnosis not present

## 2021-07-13 DIAGNOSIS — H524 Presbyopia: Secondary | ICD-10-CM | POA: Diagnosis not present

## 2021-07-13 DIAGNOSIS — H5211 Myopia, right eye: Secondary | ICD-10-CM | POA: Diagnosis not present

## 2021-07-13 DIAGNOSIS — H52203 Unspecified astigmatism, bilateral: Secondary | ICD-10-CM | POA: Diagnosis not present

## 2021-07-14 DIAGNOSIS — E7800 Pure hypercholesterolemia, unspecified: Secondary | ICD-10-CM | POA: Diagnosis not present

## 2021-07-14 DIAGNOSIS — D649 Anemia, unspecified: Secondary | ICD-10-CM | POA: Diagnosis not present

## 2021-07-14 DIAGNOSIS — F329 Major depressive disorder, single episode, unspecified: Secondary | ICD-10-CM | POA: Diagnosis not present

## 2021-07-14 DIAGNOSIS — N400 Enlarged prostate without lower urinary tract symptoms: Secondary | ICD-10-CM | POA: Diagnosis not present

## 2021-07-14 DIAGNOSIS — J309 Allergic rhinitis, unspecified: Secondary | ICD-10-CM | POA: Diagnosis not present

## 2021-07-14 DIAGNOSIS — Z125 Encounter for screening for malignant neoplasm of prostate: Secondary | ICD-10-CM | POA: Diagnosis not present

## 2021-07-14 DIAGNOSIS — Z0000 Encounter for general adult medical examination without abnormal findings: Secondary | ICD-10-CM | POA: Diagnosis not present

## 2021-07-14 DIAGNOSIS — I10 Essential (primary) hypertension: Secondary | ICD-10-CM | POA: Diagnosis not present

## 2021-07-18 NOTE — Progress Notes (Signed)
? ? ?Chief Complaint  ?Patient presents with  ? Follow-up  ?  CAD  ?  ?History of Present Illness: 61 yo male with history of CAD, ischemic cardiomyopathy, LV thrombus, chronic combined CHF, HTN, HLD, sinus bradycardia, stage 2 CKD and sleep apnea who is here today for cardiac follow up. He had a bare metal stent placed in the LAD in 2008. Cardiac cath in June 2014 and he had patent LAD stent, 50% mid Circumflex stenosis and severe disease in the RCA. A drug eluting stent was placed in the proximal RCA. Echo in 2009 with LVEF=35-40%, periapical AK, ant HK, mild LAE. He did not tolerate Ace-inhibitors due to cough. He is not on a beta blocker due to bradycardia. He did not tolerate ARB secondary to dizziness. I saw him 06/05/13 and he c/o SOB and sharp left sided chest pains as well as fatigue. I arranged a stress myoview on 06/24/13 which did not show ischemia, LVEF=34%. I saw him on 02/01/17 and he had c/o chest pain and dyspnea on exertion. Nuclear stress test 02/16/17 with scar in the anteroseptal and apical walls with ischemia noted. Cardiac cath 02/26/17 with chronic occlusion RCA, patent proximal LAD stent and severe stenosis distal Circumflex artery. A drug eluting stent was placed in the distal Circumflex artery. He was seen in our office October 2020 with increasing dyspnea on exertion but no chest pain. Echo 03/06/19 with LVEF=35-40% with apical akinesis. F/U with [facility] showed LV thrombus. He was started on Eliquis and Entresto. Repeat echo June 2021 with LVEF=35-40% and apical thrombus. No valve disease. He has not tolerated beta blockers due to bradycardia. Echo 2022 with LVEF=30-35%. Nuclear stress test April 2022 with no ischemia. He was admitted to [facility] November 2022 with a NSTEMI. Echo November 2022 with LVEF=30% and no LV thrombus. Cardiac cath November 2022 with CTO of the mid RCA and patent stents in the proximal LAD, distal Circumflex and proximal RCA. He was seen in the Advanced Heart Failure  clinic 05/23/21 by Dr. Aundra Dubin. ICD was discussed but the patient did not wish to consider at that time. No changes were made in his medical therapy.  ? ?He is here today for follow up of his CAD. The patient denies any chest pain, dyspnea, palpitations, lower extremity edema, orthopnea, PND, dizziness, near syncope or syncope. He is feeling well overall. Some fatigue with heavy exertion.  ?  ? ?Primary Care Physician: Alroy Dust, L.Marlou Sa, MD ? ?Past Medical History:  ?Diagnosis Date  ? Allergic rhinitis   ? Anxiety   ? Arthritis   ? "lower back, right thumb, knees" (10/04/2012)  ? Asthma   ? "grew out of it" (10/04/2012)  ? CKD (chronic kidney disease), stage II   ? Coronary artery disease   ? a. s/p prior stent to LAD;  b. LHC 6/14: DES to pRCA. c. DES to dCx 01/2017 with residual disease.  ? Depression   ? Hyperlipidemia   ? Hypertension   ? Ischemic cardiomyopathy   ? LV (left ventricular) mural thrombus   ? MI (myocardial infarction) ([place]) 03/2007  ? OSA (obstructive sleep apnea)   ? mild  ? Pneumonia   ? "as a child" (10/04/2012)  ? Sinus bradycardia   ? ? ?Past Surgical History:  ?Procedure Laterality Date  ? ARTHROPLASTY Right 1993  ? 'crushed; removed bone fragments" (10/04/2012)  ? CARDIAC CATHETERIZATION  2010  ? CORONARY ANGIOPLASTY WITH STENT PLACEMENT  2008; 10/04/2012  ? "1 + 1" (10/04/2012)  ?  CORONARY STENT INTERVENTION N/A 02/26/2017  ? Procedure: CORONARY STENT INTERVENTION;  Surgeon: Burnell Blanks, MD;  Location: [place] CV LAB;  Service: Cardiovascular;  Laterality: N/A;  ? EXPLORATORY LAPAROTOMY  1990's?  ? "went in to repair hernia; found fatty tissue instead; no hernia repair" (10/04/2012)  ? INTRAVASCULAR PRESSURE WIRE/FFR STUDY N/A 02/26/2017  ? Procedure: INTRAVASCULAR PRESSURE WIRE/FFR STUDY;  Surgeon: Burnell Blanks, MD;  Location: [place] CV LAB;  Service: Cardiovascular;  Laterality: N/A;  ? LEFT HEART CATH AND CORONARY ANGIOGRAPHY N/A 02/26/2017  ? Procedure: LEFT HEART CATH  AND CORONARY ANGIOGRAPHY;  Surgeon: Burnell Blanks, MD;  Location: [place] CV LAB;  Service: Cardiovascular;  Laterality: N/A;  ? LEFT HEART CATH AND CORONARY ANGIOGRAPHY N/A 03/29/2021  ? Procedure: LEFT HEART CATH AND CORONARY ANGIOGRAPHY;  Surgeon: Martinique, Peter M, MD;  Location: [place] CV LAB;  Service: Cardiovascular;  Laterality: N/A;  ? LEFT HEART CATHETERIZATION WITH CORONARY ANGIOGRAM N/A 10/04/2012  ? Procedure: LEFT HEART CATHETERIZATION WITH CORONARY ANGIOGRAM;  Surgeon: Sherren Mocha, MD;  Location: [facility] C[facility] LAB;  Service: Cardiovascular;  Laterality: N/A;  ? ? ?Current Outpatient Medications  ?Medication Sig Dispense Refill  ? acetaminophen (TYLENOL) 325 MG tablet Take 1 tablet by mouth every 6 (six) hours as needed for moderate pain.    ? albuterol (VENTOLIN HFA) 108 (90 Base) MCG/ACT inhaler Inhale 2 puffs into the lungs every 4 (four) hours as needed (for cough).    ? amLODipine (NORVASC) 10 MG tablet Take 10 mg by mouth daily.    ? aspirin EC 81 MG EC tablet Take 1 tablet (81 mg total) by mouth daily. Swallow whole. 30 tablet 11  ? atorvastatin (LIPITOR) 40 MG tablet Take 1 tablet (40 mg total) by mouth daily. 90 tablet 3  ? Azelastine HCl 137 MCG/SPRAY SOLN Place 1 spray into both nostrils 2 (two) times daily. As needed    ? citalopram (CELEXA) 20 MG tablet Take 1 tablet by mouth daily.    ? dapagliflozin propanediol (FARXIGA) 10 MG TABS tablet Take 1 tablet (10 mg total) by mouth daily before breakfast. 30 tablet 11  ? ELIQUIS 5 MG TABS tablet TAKE 1 TABLET BY MOUTH TWICE A DAY (Patient taking differently: Take 5 mg by mouth 2 (two) times daily.) 60 tablet 5  ? finasteride (PROSCAR) 5 MG tablet Take 5 mg by mouth daily.    ? hydrALAZINE (APRESOLINE) 50 MG tablet Take 1 tablet (50 mg total) by mouth 3 (three) times daily. 90 tablet 11  ? isosorbide mononitrate (IMDUR) 60 MG 24 hr tablet Take 1 tablet (60 mg total) by mouth daily. 30 tablet 1  ? nabumetone (RELAFEN) 500 MG tablet  Take 500 mg by mouth daily.    ? nitroGLYCERIN (NITROSTAT) 0.4 MG SL tablet PLACE 1 TABLET UNDER THE TONGUE EVERY 5 MINUTES AS NEEDED FOR CHEST PAIN. PLEASE SCHEDULE YEARLY APPOINTMENT FOR FUTURE REFILLS. THANK YOU 75 tablet 0  ? sacubitril-valsartan (ENTRESTO) 97-103 MG Take 1 tablet by mouth 2 (two) times daily. 180 tablet 0  ? spironolactone (ALDACTONE) 25 MG tablet Take 1 tablet (25 mg total) by mouth daily. 90 tablet 3  ? tamsulosin (FLOMAX) 0.4 MG CAPS capsule Take 0.4 mg by mouth daily with breakfast.     ? ?No current facility-administered medications for this visit.  ? ?Facility-Administered Medications Ordered in Other Visits  ?Medication Dose Route Frequency Provider Last Rate Last Admin  ? regadenoson (LEXISCAN) injection SOLN 0.4 mg  0.4  mg Intravenous Once Lelon Perla, MD      ? technetium tetrofosmin (TC-MYOVIEW) injection 123456 millicurie  123456 millicurie Intravenous Once PRN Lelon Perla, MD      ? ? ?Allergies  ?Allergen Reactions  ? Adhesive [Tape] Other (See Comments)  ?  SKIN SCARRING/IRRITATION. PAPER TAPE IS OKAY  ? ? ?Social History  ? ?Socioeconomic History  ? Marital status: Married  ?  Spouse name: Not on file  ? Number of children: 1  ? Years of education: Not on file  ? Highest education level: Not on file  ?Occupational History  ?  Employer: [place] [employer]  ?Tobacco Use  ? Smoking status: Former  ?  Packs/day: 0.50  ?  Years: 20.00  ?  Pack years: 10.00  ?  Types: Cigarettes  ?  Quit date: 12/31/1998  ?  Years since quitting: 22.5  ? Smokeless tobacco: Never  ?Vaping Use  ? Vaping Use: Never used  ?Substance and Sexual Activity  ? Alcohol use: Yes  ?  Comment: 10/04/2012 "1 beer plus 2 mixed drinks once/month"  ? Drug use: Yes  ?  Frequency: 14.0 times per week  ?  Types: Marijuana  ?  Comment: smokes 2 joints/day  ? Sexual activity: Yes  ?Other Topics Concern  ? Not on file  ?Social History Narrative  ? Not on file  ? ?Social Determinants of Health  ? ?Financial Resource Strain:  Not on file  ?Food Insecurity: Not on file  ?Transportation Needs: Not on file  ?Physical Activity: Not on file  ?Stress: Not on file  ?Social Connections: Not on file  ?Intimate Partner Violence: Not on fi

## 2021-07-19 ENCOUNTER — Ambulatory Visit: Payer: Federal, State, Local not specified - PPO | Admitting: Cardiovascular Disease

## 2021-07-19 ENCOUNTER — Other Ambulatory Visit: Payer: Self-pay

## 2021-07-19 ENCOUNTER — Encounter: Payer: Self-pay | Admitting: Cardiovascular Disease

## 2021-07-19 VITALS — BP 160/86 | HR 53 | Ht 69.0 in | Wt 203.4 lb

## 2021-07-19 DIAGNOSIS — I25118 Atherosclerotic heart disease of native coronary artery with other forms of angina pectoris: Secondary | ICD-10-CM

## 2021-07-19 DIAGNOSIS — I513 Intracardiac thrombosis, not elsewhere classified: Secondary | ICD-10-CM

## 2021-07-19 DIAGNOSIS — I255 Ischemic cardiomyopathy: Secondary | ICD-10-CM

## 2021-07-19 DIAGNOSIS — M2142 Flat foot [pes planus] (acquired), left foot: Secondary | ICD-10-CM | POA: Diagnosis not present

## 2021-07-19 DIAGNOSIS — F1210 Cannabis abuse, uncomplicated: Secondary | ICD-10-CM | POA: Diagnosis not present

## 2021-07-19 DIAGNOSIS — M2141 Flat foot [pes planus] (acquired), right foot: Secondary | ICD-10-CM | POA: Diagnosis not present

## 2021-07-19 DIAGNOSIS — E7800 Pure hypercholesterolemia, unspecified: Secondary | ICD-10-CM

## 2021-07-19 DIAGNOSIS — F331 Major depressive disorder, recurrent, moderate: Secondary | ICD-10-CM | POA: Diagnosis not present

## 2021-07-19 DIAGNOSIS — I10 Essential (primary) hypertension: Secondary | ICD-10-CM

## 2021-07-19 NOTE — Patient Instructions (Signed)
Medication Instructions:  Your physician recommends that you continue on your current medications as directed. Please refer to the Current Medication list given to you today.  *If you need a refill on your cardiac medications before your next appointment, please call your pharmacy*   Lab Work: NONE If you have labs (blood work) drawn today and your tests are completely normal, you will receive your results only by: MyChart Message (if you have MyChart) OR A paper copy in the mail If you have any lab test that is abnormal or we need to change your treatment, we will call you to review the results.   Testing/Procedures: NONE   Follow-Up: At [facility], you and your health needs are our priority.  As part of our continuing mission to provide you with exceptional heart care, we have created designated Provider Care Teams.  These Care Teams include your primary Cardiologist (physician) and Advanced Practice Providers (APPs -  Physician Assistants and Nurse Practitioners) who all work together to provide you with the care you need, when you need it.  We recommend signing up for the patient portal called "MyChart".  Sign up information is provided on this After Visit Summary.  MyChart is used to connect with patients for Virtual Visits (Telemedicine).  Patients are able to view lab/test results, encounter notes, upcoming appointments, etc.  Non-urgent messages can be sent to your provider as well.   To learn more about what you can do with MyChart, go to [email or web address].    Your next appointment:   1 year(s)  The format for your next appointment:   In Person  Provider:   Christopher McAlhany, MD {   

## 2021-08-05 NOTE — Progress Notes (Signed)
PCP: Clovis Riley, L.[name], MD ?Cardiology: Dr. Clifton James ?HF Cardiology: Dr. Shirlee Latch ? ?61 y.o. with history of HTN, ischemic cardiomyopathy, CAD, and LV thrombus was referred by Dr. Clifton James for evaluation of CHF.  Patient had initial PCI in 2004 to proximal RCA.  In 2008, had had anterior MI with BMS to LAD.  In 10/18, he had DES to distal LCx (RCA noted to be chronically occluded).  He has chronic systolic CHF and has been noted to have an LV thrombus in the past.  Echo in 4/22 showed EF 30-35%, normal RV.  ? ?He was admitted with chest pain in 11/22, this showed chronic total occlusion of RCA with collaterals, patent stents in the LAD, LCx, proximal RCA.  Echo in 11/22 showed EF 30-35%, normal RV size and systolic function, no LV thrombus.  ? ?Follow up 1/23, stable NYHA I-II symptoms and euvolemic. Discussed ICD however patient declined.  ? ?Today he returns for HF follow up with his wife. Overall feeling fine. He has mild dyspnea with increased physical exertion. Denies abnormal bleeding, palpitations, CP, dizziness, edema, or PND/Orthopnea. Appetite ok. No fever or chills. Weight at home 200-205 pounds. Taking all medications. Not interested in ICD. ? ?ECG (personally reviewed): none ordered today. ? ?Labs (10/22): LDL 84, HDl 42, K 4.4, creatinine 1.61 ?Labs (12/22): K 4.3, creatinine 1.33 ?Labs (1/23): K 3.8, creatinine 1.21, LDL 70, HDL 43 ? ?PMH:  ?1. HTN ?2. Hyperlipidemia ?3. H/o LV thrombus ?4. CAD:  ?- DES to proximal RCA in 2004 ?- Anterior MI in 2008 with BMS to LAD.  ?- DES to distal LCx in 10/18, also noted to have CTO RCA.  ?- Cardiolite (4/22): EF 31%, peri-apical fixed defect with no ischemia.  ?- Echo (11/22): Chronic total occlusion of RCA with collaterals, patent stents in the LAD, LCx, proximal RCA ?5. Chronic systolic CHF: Ischemic cardiomyopathy.   ?- Echo (11/20): EF 40-45%, apical thrombus.  ?- Echo (6/21): EF 35-40%, normal RV ?- Echo (4/22): EF 30-35%, LAD territory WMAs, normal RV.  ?-  Echo (11/22): EF 30-35%, normal RV size and systolic function, no LV thrombus. ? ?SH: Married, retired from the post [employer], occasional marijuana, occasional ETOH, remote smoker.  ? ?Family History  ?Problem Relation Age of Onset  ? Atrial fibrillation Mother   ?     irregular heart beats  ? Hypertension Mother   ? Diabetes type II Mother   ? CAD Maternal Grandfather   ? CAD Maternal Grandmother   ? ?ROS: All systems reviewed and negative except as per HPI.  ? ?Current Outpatient Medications  ?Medication Sig Dispense Refill  ? acetaminophen (TYLENOL) 325 MG tablet Take 1 tablet by mouth every 6 (six) hours as needed for moderate pain.    ? albuterol (VENTOLIN HFA) 108 (90 Base) MCG/ACT inhaler Inhale 2 puffs into the lungs every 4 (four) hours as needed (for cough).    ? amLODipine (NORVASC) 10 MG tablet Take 10 mg by mouth daily.    ? aspirin EC 81 MG EC tablet Take 1 tablet (81 mg total) by mouth daily. Swallow whole. 30 tablet 11  ? atorvastatin (LIPITOR) 40 MG tablet Take 1 tablet (40 mg total) by mouth daily. 90 tablet 3  ? Azelastine HCl 137 MCG/SPRAY SOLN Place 1 spray into both nostrils 2 (two) times daily. As needed    ? citalopram (CELEXA) 20 MG tablet Take 1 tablet by mouth daily.    ? dapagliflozin propanediol (FARXIGA) 10 MG TABS tablet Take 1  tablet (10 mg total) by mouth daily before breakfast. 30 tablet 11  ? ELIQUIS 5 MG TABS tablet TAKE 1 TABLET BY MOUTH TWICE A DAY 60 tablet 5  ? finasteride (PROSCAR) 5 MG tablet Take 5 mg by mouth daily.    ? hydrALAZINE (APRESOLINE) 50 MG tablet Take 1 tablet (50 mg total) by mouth 3 (three) times daily. 90 tablet 11  ? isosorbide mononitrate (IMDUR) 60 MG 24 hr tablet Take 1 tablet (60 mg total) by mouth daily. 30 tablet 1  ? nabumetone (RELAFEN) 500 MG tablet Take 500 mg by mouth daily.    ? nitroGLYCERIN (NITROSTAT) 0.4 MG SL tablet PLACE 1 TABLET UNDER THE TONGUE EVERY 5 MINUTES AS NEEDED FOR CHEST PAIN. PLEASE SCHEDULE YEARLY APPOINTMENT FOR FUTURE REFILLS.  THANK YOU 75 tablet 0  ? sacubitril-valsartan (ENTRESTO) 97-103 MG Take 1 tablet by mouth 2 (two) times daily. 180 tablet 0  ? spironolactone (ALDACTONE) 25 MG tablet Take 1 tablet (25 mg total) by mouth daily. 90 tablet 3  ? tamsulosin (FLOMAX) 0.4 MG CAPS capsule Take 0.4 mg by mouth daily with breakfast.     ? ?No current facility-administered medications for this encounter.  ? ?Facility-Administered Medications Ordered in Other Encounters  ?Medication Dose Route Frequency Provider Last Rate Last Admin  ? regadenoson (LEXISCAN) injection SOLN 0.4 mg  0.4 mg Intravenous Once Lewayne Bunting, MD      ? technetium tetrofosmin (TC-MYOVIEW) injection 30.9 millicurie  30.9 millicurie Intravenous Once PRN Lewayne Bunting, MD      ? ?Wt Readings from Last 3 Encounters:  ?08/08/21 89.8 kg (198 lb)  ?07/19/21 92.3 kg (203 lb 6.4 oz)  ?05/23/21 92.3 kg (203 lb 6.4 oz)  ? ?BP 124/78   Pulse (!) 52   Wt 89.8 kg (198 lb)   SpO2 98%   BMI 29.24 kg/m?  ?Physical Exam: ?General:  NAD. No resp difficulty ?HEENT: Normal ?Neck: Supple. No JVD. Carotids 2+ bilat; no bruits. No lymphadenopathy or thryomegaly appreciated. ?Cor: PMI nondisplaced. Brady rate & rhythm. No rubs, gallops or murmurs. ?Lungs: Clear ?Abdomen: Soft, nontender, nondistended. No hepatosplenomegaly. No bruits or masses. Good bowel sounds. ?Extremities: No cyanosis, clubbing, rash, edema ?Neuro: Alert & oriented x 3, cranial nerves grossly intact. Moves all 4 extremities w/o difficulty. Affect pleasant. ? ?Assessment/Plan: ?1. CAD: No recent chest pain.  Has history of anterior MI in 2008. Has CTO RCA known from 10/18 cath.  Repeat cath for chest pain in 11/22 showed chronic total occlusion of RCA with collaterals, patent stents in the LAD, LCx, proximal RCA.  No further chest pain.  ?- Given Eliquis use, he is not on ASA.  ?- Continue atorvastatin, check LFTs today. Good lipids 1/23. ?2. Chronic systolic CHF: Ischemic cardiomyopathy.  Echo in 11/22 with EF  30-35% and peri-apical wall motion abnormalities. NYHA class I-II, not volume overloaded on exam.  ?- Continue hydralazine 25 mg tid + Imdur 30 mg daily, will not increase hydralazine as he gets "indigestion" when he takes it.  Would try over the counter pantoprazole to see if this helps.               ?- Continue dapagliflozin 10 mg daily.  ?- Continue spironolactone 25 daily.  BMET today.  ?- Continue Entresto 97/103 mg bid.  ?- HR 50s, think in the future he could probably take low dose Coreg 3.125 mg bid.  ?- EF remains < 35%, narrow QRS.  ICD candidate but not CRT candidate.  Discussed ICD again today, he is not interested at this time.  Will repeat echo in 6 months and re-discuss ICD.   ?3. LV thrombus: Present in the past.  Continue Eliquis.  ? ?Follow up in 3-4 months with Dr. Shirlee Latch + echo. ? ?Jacklynn Ganong, FNP-BC ?08/08/2021 ? ?

## 2021-08-08 ENCOUNTER — Ambulatory Visit (HOSPITAL_COMMUNITY)
Admission: RE | Admit: 2021-08-08 | Discharge: 2021-08-08 | Disposition: A | Discharge status: Home / Self Care | Payer: Federal, State, Local not specified - PPO | Source: Ambulatory Visit | Attending: Family Medicine | Admitting: Family Medicine

## 2021-08-08 ENCOUNTER — Encounter (HOSPITAL_COMMUNITY): Payer: Self-pay

## 2021-08-08 VITALS — BP 124/78 | HR 52 | Wt 198.0 lb

## 2021-08-08 DIAGNOSIS — Z955 Presence of coronary angioplasty implant and graft: Secondary | ICD-10-CM | POA: Insufficient documentation

## 2021-08-08 DIAGNOSIS — I513 Intracardiac thrombosis, not elsewhere classified: Secondary | ICD-10-CM

## 2021-08-08 DIAGNOSIS — Z79899 Other long term (current) drug therapy: Secondary | ICD-10-CM | POA: Insufficient documentation

## 2021-08-08 DIAGNOSIS — Z7901 Long term (current) use of anticoagulants: Secondary | ICD-10-CM | POA: Insufficient documentation

## 2021-08-08 DIAGNOSIS — I110 Hypertensive heart disease with heart failure: Secondary | ICD-10-CM | POA: Diagnosis not present

## 2021-08-08 DIAGNOSIS — I255 Ischemic cardiomyopathy: Secondary | ICD-10-CM | POA: Diagnosis not present

## 2021-08-08 DIAGNOSIS — I25118 Atherosclerotic heart disease of native coronary artery with other forms of angina pectoris: Secondary | ICD-10-CM | POA: Diagnosis not present

## 2021-08-08 DIAGNOSIS — I2510 Atherosclerotic heart disease of native coronary artery without angina pectoris: Secondary | ICD-10-CM | POA: Insufficient documentation

## 2021-08-08 DIAGNOSIS — I252 Old myocardial infarction: Secondary | ICD-10-CM | POA: Diagnosis not present

## 2021-08-08 DIAGNOSIS — R079 Chest pain, unspecified: Secondary | ICD-10-CM | POA: Insufficient documentation

## 2021-08-08 DIAGNOSIS — I5022 Chronic systolic (congestive) heart failure: Secondary | ICD-10-CM | POA: Diagnosis not present

## 2021-08-08 LAB — COMPREHENSIVE METABOLIC PANEL
ALT: 17 U/L (ref 0–44)
AST: 23 U/L (ref 15–41)
Albumin: 3.8 g/dL (ref 3.5–5.0)
Alkaline Phosphatase: 67 U/L (ref 38–126)
Anion gap: 5 (ref 5–15)
BUN: 19 mg/dL (ref 6–20)
CO2: 26 mmol/L (ref 22–32)
Calcium: 9 mg/dL (ref 8.9–10.3)
Chloride: 110 mmol/L (ref 98–111)
Creatinine, Ser: 1.26 mg/dL — ABNORMAL HIGH (ref 0.61–1.24)
GFR, Estimated: >60 mL/min (ref >60)
Glucose, Bld: 120 mg/dL — ABNORMAL HIGH (ref 70–99)
Potassium: 4.3 mmol/L (ref 3.5–5.1)
Sodium: 141 mmol/L (ref 135–145)
Total Bilirubin: 0.4 mg/dL (ref 0.3–1.2)
Total Protein: 6.6 g/dL (ref 6.5–8.1)

## 2021-08-08 MED ORDER — ATORVASTATIN CALCIUM 40 MG PO TABS: 40.0000 mg | ORAL_TABLET | Freq: Every day | ORAL | 1 refills | Status: DC

## 2021-08-08 NOTE — Patient Instructions (Signed)
Thank you for coming in today ? ?Labs were done today, if any labs are abnormal the clinic will call you ?No news is good news ? ?We refilled you Atorvastatin ? ?Your physician recommends that you schedule a follow-up appointment in:  ?4 month with Dr. Aundra Dubin with echocardiogram ? ?Your physician has requested that you have an echocardiogram. Echocardiography is a painless test that uses sound waves to create images of your heart. It provides your doctor with information about the size and shape of your heart and how well your heart?s chambers and valves are working. This procedure takes approximately one hour. There are no restrictions for this procedure. ?  ?At the [facility], you and your health needs are our priority. As part of our continuing mission to provide you with exceptional heart care, we have created designated Provider Care Teams. These Care Teams include your primary Cardiologist (physician) and Advanced Practice Providers (APPs- Physician Assistants and Nurse Practitioners) who all work together to provide you with the care you need, when you need it.  ? ?You may see any of the following providers on your designated Care Team at your next follow up: ?Dr Glori Bickers ?Dr Loralie Champagne ?Darrick Grinder, NP ?Lyda Jester, PA ?Jessica Milford,NP ?Marlyce Huge, PA ?Audry Riles, PharmD ? ? ?Please be sure to bring in all your medications bottles to every appointment.  ? ?If you have any questions or concerns before your next appointment please send [place] a message through [place] or call our office at [phone].   ? ?TO LEAVE A MESSAGE FOR THE NURSE SELECT OPTION 2, PLEASE LEAVE A MESSAGE INCLUDING: ?YOUR NAME ?DATE OF BIRTH ?CALL BACK NUMBER ?REASON FOR CALL**this is important as we prioritize the call backs ? ?YOU WILL RECEIVE A CALL BACK THE SAME DAY AS LONG AS YOU CALL BEFORE 4:00 PM ? ?

## 2021-08-08 NOTE — Addendum Note (Signed)
Encounter addended by: Payton Mccallum, RN on: 08/08/2021 11:58 AM ? Actions taken: Order list changed, Diagnosis association updated, Clinical Note Signed, Charge Capture section accepted, Flowsheet accepted

## 2021-09-07 DIAGNOSIS — F4310 Post-traumatic stress disorder, unspecified: Secondary | ICD-10-CM | POA: Diagnosis not present

## 2021-09-15 ENCOUNTER — Telehealth: Payer: Self-pay

## 2021-09-15 DIAGNOSIS — F99 Mental disorder, not otherwise specified: Secondary | ICD-10-CM | POA: Diagnosis not present

## 2021-09-15 NOTE — Telephone Encounter (Signed)
**Note De-Identified [name]** Marcelline Deist PA started through covermymeds and was approved By [employer] as follows: Phone: [phone] Fax: [phone] Date: 09/15/2021 To: Dr. Clifton James Fax #: [phone] From: [facility] Re: Austin Ingram, Dec 06, 1960,  Message: The Prior Authorization request has been approved for Farxiga 10MG  OR TABS. The authorization is valid from 08/16/2021 through 09/15/2022. A letter of explanation will also be mailed to the patient.  I have notified [id number] - [place], [place] - [address] (Ph: 09/17/2022) of this approval.

## 2021-09-19 ENCOUNTER — Other Ambulatory Visit: Payer: Self-pay | Admitting: Cardiovascular Disease

## 2021-09-21 DIAGNOSIS — F99 Mental disorder, not otherwise specified: Secondary | ICD-10-CM | POA: Diagnosis not present

## 2021-09-26 IMAGING — US US ABDOMEN COMPLETE
1 series · 14 of 25 positions shown · non-contrast
Comparison: None.

CLINICAL DATA: Abdominal pain for 2 months

EXAM:
ABDOMEN ULTRASOUND COMPLETE

[Series 1: us abdomen complete · 0.23mm/px · 14 of 83 slices shown]
[im 1/83]
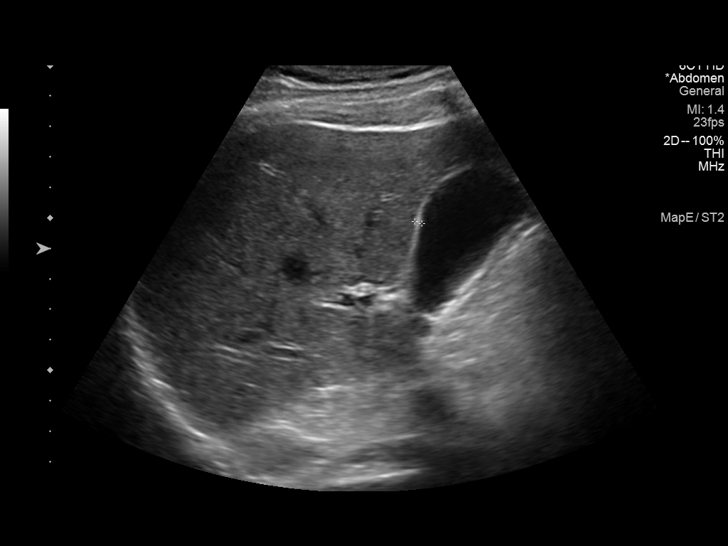
[im 7/83]
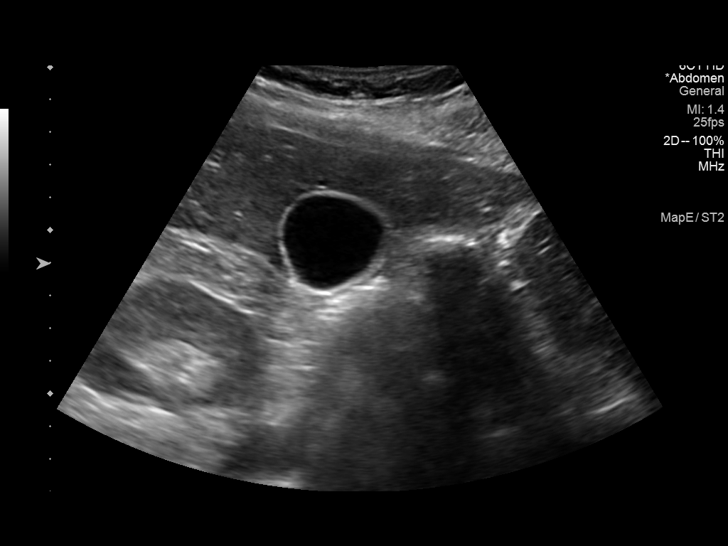
[im 14/83]
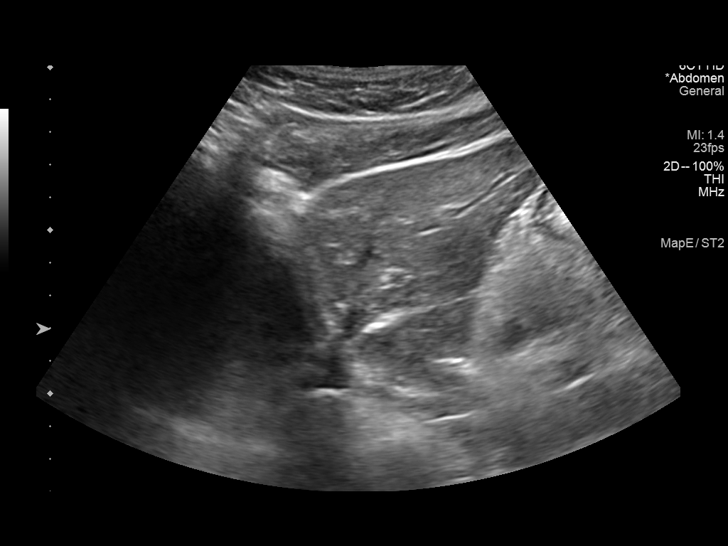
[im 21/83]
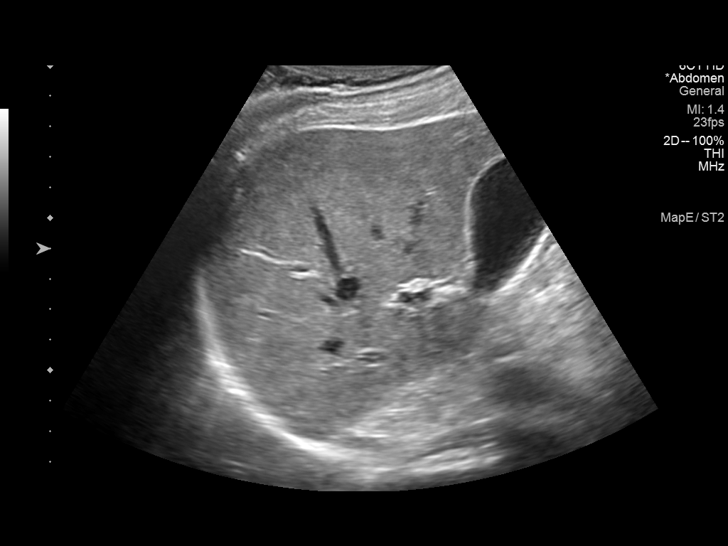
[im 28/83]
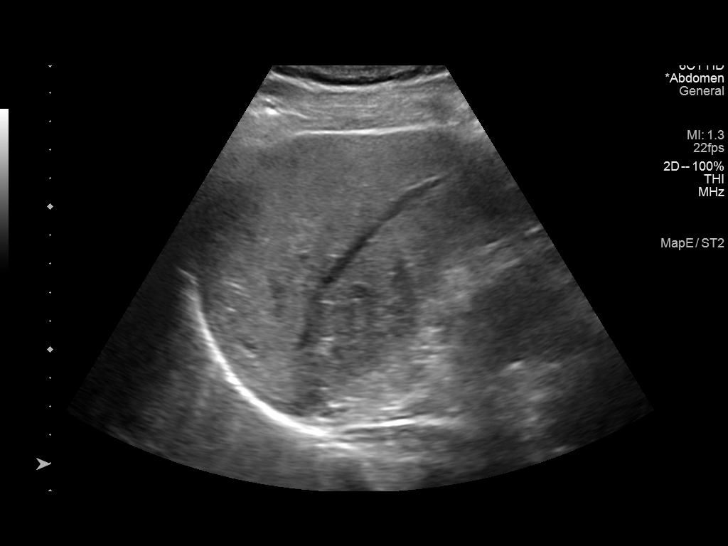
[im 31/83]
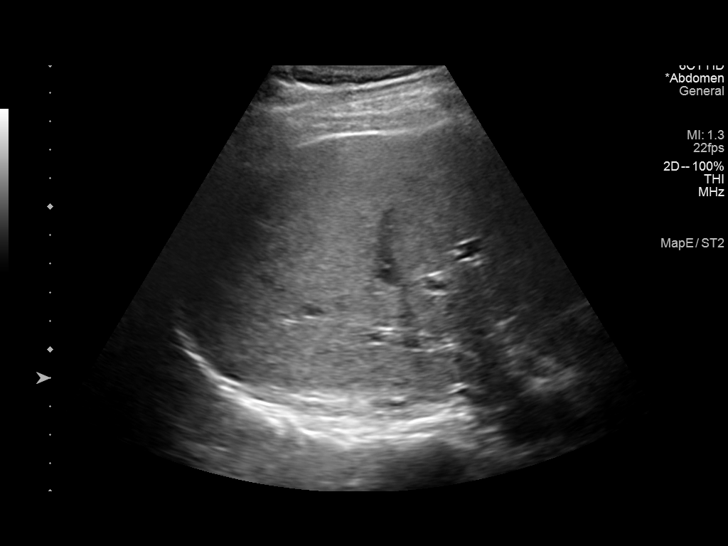
[im 38/83]
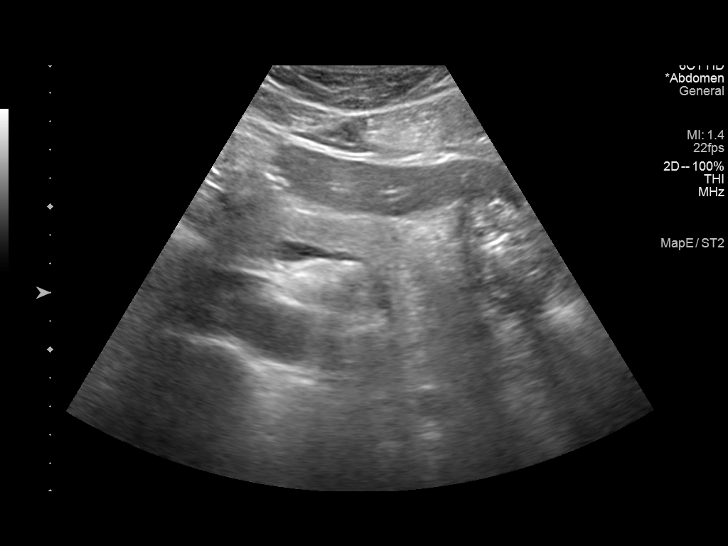
[im 45/83]
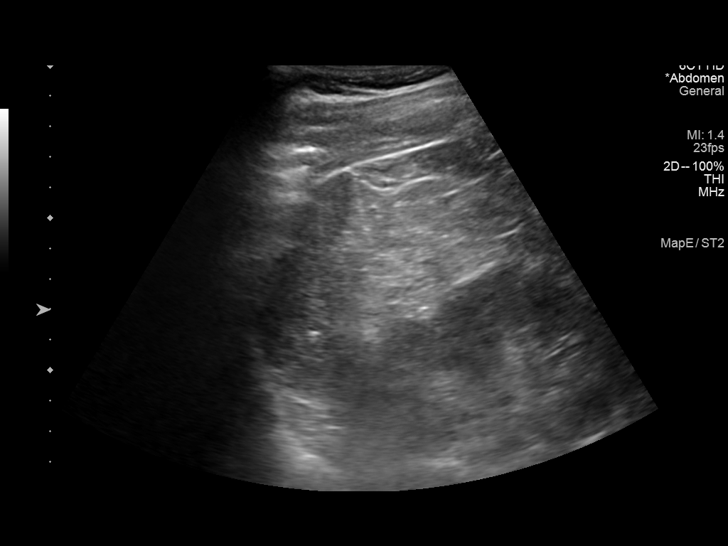
[im 52/83]
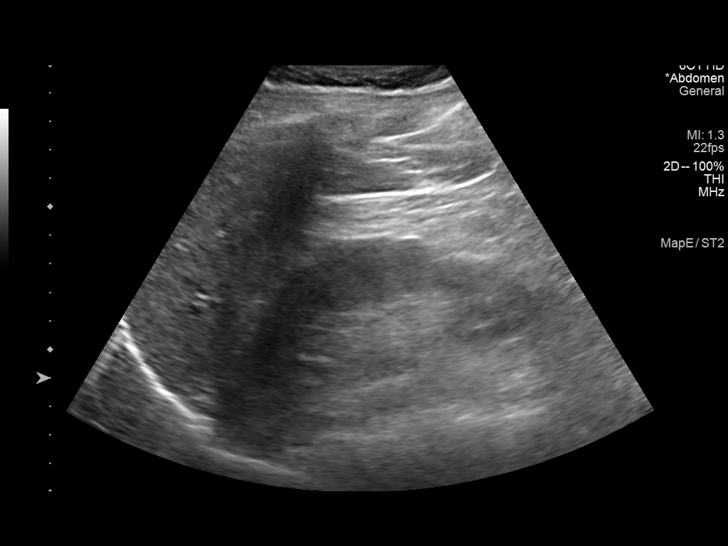
[im 55/83]
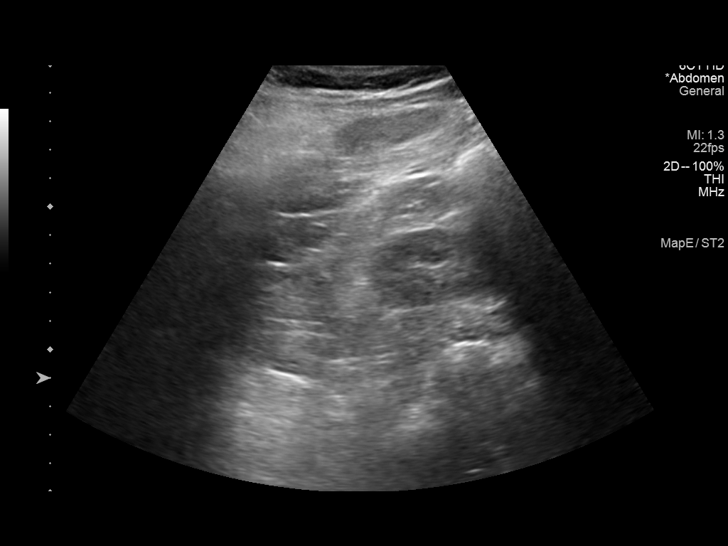
[im 62/83]
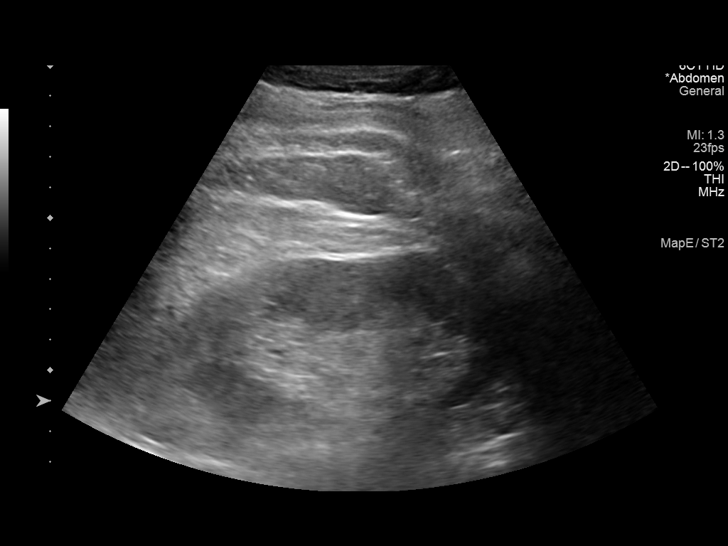
[im 69/83]
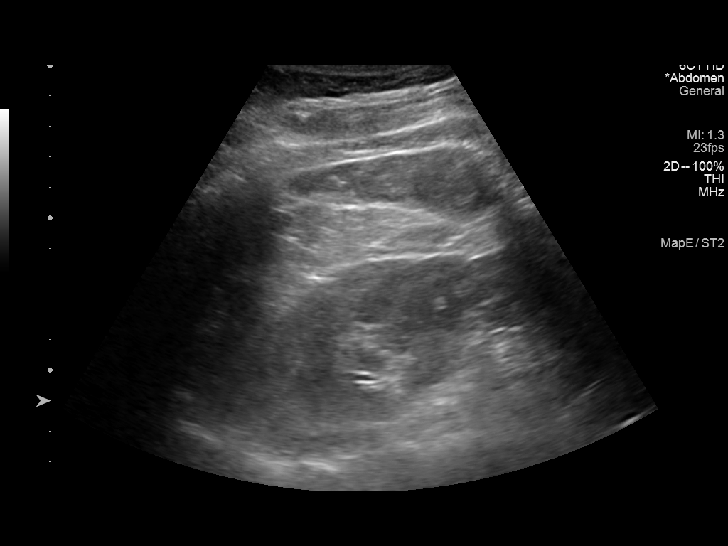
[im 76/83]
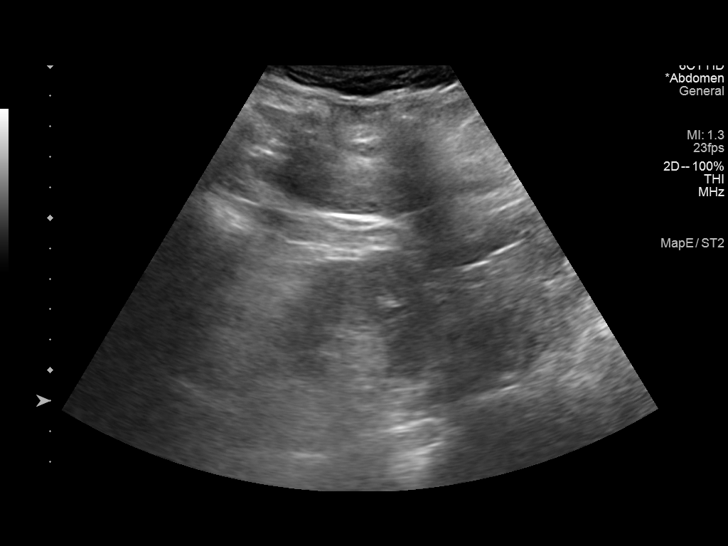
[im 83/83]
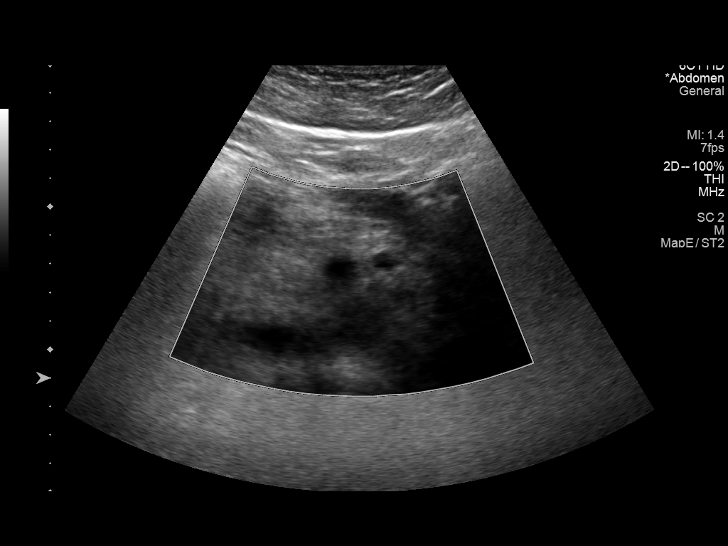

[14 of 25 positions shown; findings below may reference images not displayed]

FINDINGS: Gallbladder: No gallstones or wall thickening visualized. No
sonographic Murphy sign noted by sonographer.

Common bile duct: Diameter: 2 mm

Liver: Echogenicity within normal limits. No focal parenchymal
abnormality. Portal vein is patent on color Doppler imaging with
normal direction of blood flow towards the liver.

IVC: No abnormality visualized.

Pancreas: Visualized portion unremarkable.

Spleen: Size and appearance within normal limits.

Right Kidney: Length: 10.7 cm. Echogenicity within normal limits. No
mass or hydronephrosis visualized.

Left Kidney: Length: 11 cm. Echogenicity within normal limits. No
mass or hydronephrosis visualized.

Abdominal aorta: No aneurysm visualized.

Other findings: None.
IMPRESSION: Negative abdominal ultrasound

## 2021-09-29 DIAGNOSIS — F99 Mental disorder, not otherwise specified: Secondary | ICD-10-CM | POA: Diagnosis not present

## 2021-10-13 DIAGNOSIS — F99 Mental disorder, not otherwise specified: Secondary | ICD-10-CM | POA: Diagnosis not present

## 2021-10-24 DIAGNOSIS — I252 Old myocardial infarction: Secondary | ICD-10-CM | POA: Diagnosis not present

## 2021-10-24 DIAGNOSIS — E669 Obesity, unspecified: Secondary | ICD-10-CM | POA: Diagnosis not present

## 2021-10-24 DIAGNOSIS — I2510 Atherosclerotic heart disease of native coronary artery without angina pectoris: Secondary | ICD-10-CM | POA: Diagnosis not present

## 2021-10-24 DIAGNOSIS — I480 Paroxysmal atrial fibrillation: Secondary | ICD-10-CM | POA: Diagnosis not present

## 2021-10-24 DIAGNOSIS — I10 Essential (primary) hypertension: Secondary | ICD-10-CM | POA: Diagnosis not present

## 2021-10-24 DIAGNOSIS — F32A Depression, unspecified: Secondary | ICD-10-CM | POA: Diagnosis not present

## 2021-10-28 DIAGNOSIS — F331 Major depressive disorder, recurrent, moderate: Secondary | ICD-10-CM | POA: Diagnosis not present

## 2021-11-10 DIAGNOSIS — F99 Mental disorder, not otherwise specified: Secondary | ICD-10-CM | POA: Diagnosis not present

## 2021-11-16 DIAGNOSIS — Z7189 Other specified counseling: Secondary | ICD-10-CM | POA: Diagnosis not present

## 2021-11-21 ENCOUNTER — Other Ambulatory Visit: Payer: Self-pay | Admitting: Physician Assistant

## 2021-11-24 DIAGNOSIS — F99 Mental disorder, not otherwise specified: Secondary | ICD-10-CM | POA: Diagnosis not present

## 2021-11-29 DIAGNOSIS — Z1211 Encounter for screening for malignant neoplasm of colon: Secondary | ICD-10-CM | POA: Diagnosis not present

## 2021-12-08 DIAGNOSIS — F99 Mental disorder, not otherwise specified: Secondary | ICD-10-CM | POA: Diagnosis not present

## 2021-12-22 DIAGNOSIS — F339 Major depressive disorder, recurrent, unspecified: Secondary | ICD-10-CM | POA: Diagnosis not present

## 2021-12-22 DIAGNOSIS — F99 Mental disorder, not otherwise specified: Secondary | ICD-10-CM | POA: Diagnosis not present

## 2021-12-27 DIAGNOSIS — F339 Major depressive disorder, recurrent, unspecified: Secondary | ICD-10-CM | POA: Diagnosis not present

## 2021-12-29 DIAGNOSIS — F339 Major depressive disorder, recurrent, unspecified: Secondary | ICD-10-CM | POA: Diagnosis not present

## 2022-01-03 DIAGNOSIS — Z653 Problems related to other legal circumstances: Secondary | ICD-10-CM | POA: Diagnosis not present

## 2022-01-05 DIAGNOSIS — F339 Major depressive disorder, recurrent, unspecified: Secondary | ICD-10-CM | POA: Diagnosis not present

## 2022-01-19 DIAGNOSIS — F99 Mental disorder, not otherwise specified: Secondary | ICD-10-CM | POA: Diagnosis not present

## 2022-01-19 DIAGNOSIS — F339 Major depressive disorder, recurrent, unspecified: Secondary | ICD-10-CM | POA: Diagnosis not present

## 2022-01-31 DIAGNOSIS — M545 Low back pain, unspecified: Secondary | ICD-10-CM | POA: Diagnosis not present

## 2022-01-31 DIAGNOSIS — I2510 Atherosclerotic heart disease of native coronary artery without angina pectoris: Secondary | ICD-10-CM | POA: Diagnosis not present

## 2022-01-31 DIAGNOSIS — I10 Essential (primary) hypertension: Secondary | ICD-10-CM | POA: Diagnosis not present

## 2022-01-31 DIAGNOSIS — E7800 Pure hypercholesterolemia, unspecified: Secondary | ICD-10-CM | POA: Diagnosis not present

## 2022-02-14 ENCOUNTER — Other Ambulatory Visit: Payer: Self-pay | Admitting: Cardiovascular Disease

## 2022-03-08 DIAGNOSIS — J329 Chronic sinusitis, unspecified: Secondary | ICD-10-CM | POA: Diagnosis not present

## 2022-03-16 ENCOUNTER — Other Ambulatory Visit: Payer: Self-pay | Admitting: Cardiovascular Disease

## 2022-04-21 ENCOUNTER — Other Ambulatory Visit (HOSPITAL_COMMUNITY): Payer: Self-pay | Admitting: Cardiology

## 2022-05-12 ENCOUNTER — Telehealth: Payer: Self-pay | Admitting: *Deleted

## 2022-05-12 ENCOUNTER — Other Ambulatory Visit: Payer: Self-pay | Admitting: Gastroenterology

## 2022-05-12 DIAGNOSIS — K5790 Diverticulosis of intestine, part unspecified, without perforation or abscess without bleeding: Secondary | ICD-10-CM | POA: Diagnosis not present

## 2022-05-12 DIAGNOSIS — I10 Essential (primary) hypertension: Secondary | ICD-10-CM | POA: Diagnosis not present

## 2022-05-12 NOTE — Telephone Encounter (Addendum)
Patient with diagnosis of LV thrombus on Eliquis for anticoagulation.    Procedure: Colonoscopy  Date of procedure: 06/06/2022   CrCl 69 ml/min (SrCr 1.27 01/2022 from [facility]) Platelet count: need updated lab - last CBC 03/29/2021  LV thrombus diagnosed- nov 2020. ECHO on Nov 2022 reports no thrombus  Per office protocol, patient can hold Eliquis for 1 day prior to procedure.   Patient will not need bridging with Lovenox (enoxaparin) around procedure.  **This guidance is not considered finalized until pre-operative APP has relayed final recommendations.**

## 2022-05-12 NOTE — Telephone Encounter (Signed)
   Pre-operative Risk Assessment    Patient Name: Austin Ingram.  DOB: [birth year] MRN: [id number]      Request for Surgical Clearance    Procedure:   COLONOSCOPY  Date of Surgery:  Clearance 06/06/22                                 Surgeon:  DR. [facility] Surgeon's Group or Practice Name:  [facility] Phone number:  [phone] Fax number:  [phone]   Type of Clearance Requested:   - Pharmacy:  Hold Apixaban (Eliquis) NOT INDICATED HOW LONG   Type of Anesthesia:  Not Indicated   Additional requests/questions:    Astrid Divine   05/12/2022, 2:53 PM

## 2022-05-15 NOTE — Telephone Encounter (Signed)
   Name: Austin Ingram.  DOB: 1960-09-04  MRN: [id number]  Primary Cardiologist: Lauree Chandler, MD   Preoperative team, please contact this patient and set up a phone call appointment for further preoperative risk assessment. Please obtain consent and complete medication review. Thank you for your help.  CrCl 69 ml/min (SrCr 1.27 01/2022 from [facility]) Platelet count: need updated lab - last CBC 03/29/2021   LV thrombus diagnosed- nov 2020. ECHO on Nov 2022 reports no thrombus   Per office protocol, patient can hold Eliquis for 1 day prior to procedure.   Patient will not need bridging with Lovenox (enoxaparin) around procedure  I confirm that guidance regarding antiplatelet and oral anticoagulation therapy has been completed and, if necessary, noted below.   Mable Fill, Marissa Nestle, NP 05/15/2022, 7:42 AM [place]

## 2022-05-15 NOTE — Telephone Encounter (Signed)
   Patient Name: Austin Ingram.  DOB: 1960/06/18 MRN: [id number]  Primary Cardiologist: Lauree Chandler, MD  Chart reviewed as part of pre-operative protocol coverage.   Preop team please arrange updated CBC for patient per pharmacy prior to clearance being granted.  Please let me know if you have any additional questions.   Mable Fill, Marissa Nestle, NP 05/15/2022, 7:40 AM

## 2022-05-15 NOTE — Telephone Encounter (Signed)
Left message for the patient to contact the office. 

## 2022-05-18 ENCOUNTER — Telehealth: Payer: Self-pay | Admitting: *Deleted

## 2022-05-18 DIAGNOSIS — Z01818 Encounter for other preprocedural examination: Secondary | ICD-10-CM

## 2022-05-18 NOTE — Addendum Note (Signed)
Addended by: Gaetano Net on: 05/18/2022 08:17 AM   Modules accepted: Orders

## 2022-05-18 NOTE — Telephone Encounter (Signed)
Pt will come in Monday, 05/22/22, for CBC.

## 2022-05-18 NOTE — Telephone Encounter (Signed)
Pt has been scheduled for a tele visit, 05/22/22 2:40 and will come in 05/22/22 for a CBC.   Consent on file / medications reconciled.

## 2022-05-18 NOTE — Telephone Encounter (Signed)
Pt has been scheduled for a tele visit, 05/22/22, 2:40.  Consent on file / medications reconciled.    Patient Consent for Virtual Visit        Austin Ingram Austin Ingram. has provided verbal consent on 05/18/2022 for a virtual visit (video or telephone).   CONSENT FOR VIRTUAL VISIT FOR:  Austin Gentle Jr.  By participating in this virtual visit I agree to the following:  I hereby voluntarily request, consent and authorize [facility] and its employed or contracted physicians, physician assistants, nurse practitioners or other licensed health care professionals (the Practitioner), to provide me with telemedicine health care services (the "Services") as deemed necessary by the treating Practitioner. I acknowledge and consent to receive the Services by the Practitioner via telemedicine. I understand that the telemedicine visit will involve communicating with the Practitioner through live audiovisual communication technology and the disclosure of certain medical information by electronic transmission. I acknowledge that I have been given the opportunity to request an in-person assessment or other available alternative prior to the telemedicine visit and am voluntarily participating in the telemedicine visit.  I understand that I have the right to withhold or withdraw my consent to the use of telemedicine in the course of my care at any time, without affecting my right to future care or treatment, and that the Practitioner or I may terminate the telemedicine visit at any time. I understand that I have the right to inspect all information obtained and/or recorded in the course of the telemedicine visit and may receive copies of available information for a reasonable fee.  I understand that some of the potential risks of receiving the Services via telemedicine include:  Delay or interruption in medical evaluation due to technological equipment failure or disruption; Information transmitted may not be sufficient  (e.g. poor resolution of images) to allow for appropriate medical decision making by the Practitioner; and/or  In rare instances, security protocols could fail, causing a breach of personal health information.  Furthermore, I acknowledge that it is my responsibility to provide information about my medical history, conditions and care that is complete and accurate to the best of my ability. I acknowledge that Practitioner's advice, recommendations, and/or decision may be based on factors not within their control, such as incomplete or inaccurate data provided by me or distortions of diagnostic images or specimens that may result from electronic transmissions. I understand that the practice of medicine is not an exact science and that Practitioner makes no warranties or guarantees regarding treatment outcomes. I acknowledge that a copy of this consent can be made available to me via my patient portal ([place]), or I can request a printed copy by calling the office of [facility].    I understand that my insurance will be billed for this visit.   I have read or had this consent read to me. I understand the contents of this consent, which adequately explains the benefits and risks of the Services being provided via telemedicine.  I have been provided ample opportunity to ask questions regarding this consent and the Services and have had my questions answered to my satisfaction. I give my informed consent for the services to be provided through the use of telemedicine in my medical care

## 2022-05-22 ENCOUNTER — Other Ambulatory Visit: Payer: Federal, State, Local not specified - PPO

## 2022-05-22 ENCOUNTER — Encounter: Payer: Self-pay | Admitting: Nurse Practitioner

## 2022-05-22 ENCOUNTER — Ambulatory Visit: Payer: Federal, State, Local not specified - PPO | Attending: Internal Medicine | Admitting: Nurse Practitioner

## 2022-05-22 DIAGNOSIS — Z01810 Encounter for preprocedural cardiovascular examination: Secondary | ICD-10-CM | POA: Diagnosis not present

## 2022-05-22 NOTE — Progress Notes (Signed)
Virtual Visit via Telephone Note   Because of Austin Boroff Jr.'s co-morbid illnesses, he is at least at moderate risk for complications without adequate follow up.  This format is felt to be most appropriate for this patient at this time.  The patient did not have access to video technology/had technical difficulties with video requiring transitioning to audio format only (telephone).  All issues noted in this document were discussed and addressed.  No physical exam could be performed with this format.  Please refer to the patient's chart for his consent to telehealth for [facility].  Evaluation Performed:  Preoperative cardiovascular risk assessment _____________   Date:  05/22/2022   Patient ID:  Austin Pew Mcduffie Jr., DOB November 06, 1960, MRN [id number] Patient Location:  Home Provider location:   Office  Primary Care Provider:  Alroy Dust, L.Marlou Sa, MD Primary Cardiologist:  Lauree Chandler, MD  Chief Complaint / Patient Profile   62 y.o. y/o male with a h/o CAD s/p BMS-LAD in 2008, DES-P RCA in 2014, DES-LCx in 2018, ICM, chronic combined systolic and diastolic heart failure, LV thrombus, sinus bradycardia, hypertension, hyperlipidemia, CKD stage II, and OSA who is pending colonoscopy on 06/06/2022 with Dr. Therisa Doyne of [facility] and presents today for telephonic preoperative cardiovascular risk assessment.  History of Present Illness    Austin Ingram. is a 62 y.o. male who presents via audio/video conferencing for a telehealth visit today.  Pt was last seen in cardiology clinic on 07/19/2021 by Dr. Angelena Form.  At that time Austin Ingram. was doing well.  The patient is now pending procedure as outlined above. Since his last visit, he has done well from a cardiac standpoint.   He denies chest pain, palpitations, dyspnea, pnd, orthopnea, n, v, dizziness, syncope, edema, weight gain, or early satiety. All other systems reviewed and are otherwise negative except as noted above.   Past  Medical History    Past Medical History:  Diagnosis Date   Allergic rhinitis    Anxiety    Arthritis    "lower back, right thumb, knees" (10/04/2012)   Asthma    "grew out of it" (10/04/2012)   CKD (chronic kidney disease), stage II    Coronary artery disease    a. s/p prior stent to LAD;  b. LHC 6/14: DES to pRCA. c. DES to dCx 01/2017 with residual disease.   Depression    Hyperlipidemia    Hypertension    Ischemic cardiomyopathy    LV (left ventricular) mural thrombus    MI (myocardial infarction) ([place]) 03/2007   OSA (obstructive sleep apnea)    mild   Pneumonia    "as a child" (10/04/2012)   Sinus bradycardia    Past Surgical History:  Procedure Laterality Date   ARTHROPLASTY Right 1993   'crushed; removed bone fragments" (10/04/2012)   CARDIAC CATHETERIZATION  2010   CORONARY ANGIOPLASTY WITH STENT PLACEMENT  2008; 10/04/2012   "1 + 1" (10/04/2012)   CORONARY STENT INTERVENTION N/A 02/26/2017   Procedure: CORONARY STENT INTERVENTION;  Surgeon: Burnell Blanks, MD;  Location: [place] CV [facility];  Service: Cardiovascular;  Laterality: N/A;   EXPLORATORY LAPAROTOMY  1990's?   "went in to repair hernia; found fatty tissue instead; no hernia repair" (10/04/2012)   INTRAVASCULAR PRESSURE WIRE/FFR STUDY N/A 02/26/2017   Procedure: INTRAVASCULAR PRESSURE WIRE/FFR STUDY;  Surgeon: Burnell Blanks, MD;  Location: [place] [facility];  Service: Cardiovascular;  Laterality: N/A;   LEFT HEART CATH AND CORONARY ANGIOGRAPHY N/A 02/26/2017  Procedure: LEFT HEART CATH AND CORONARY ANGIOGRAPHY;  Surgeon: Kathleene Hazel, MD;  Location: [facility] INVASIVE CV LAB;  Service: Cardiovascular;  Laterality: N/A;   LEFT HEART CATH AND CORONARY ANGIOGRAPHY N/A 03/29/2021   Procedure: LEFT HEART CATH AND CORONARY ANGIOGRAPHY;  Surgeon: Swaziland, Peter M, MD;  Location: [facility] INVASIVE CV LAB;  Service: Cardiovascular;  Laterality: N/A;   LEFT HEART CATHETERIZATION WITH CORONARY ANGIOGRAM N/A 10/04/2012    Procedure: LEFT HEART CATHETERIZATION WITH CORONARY ANGIOGRAM;  Surgeon: Tonny Bollman, MD;  Location: [facility] CATH LAB;  Service: Cardiovascular;  Laterality: N/A;    Allergies  Allergies  Allergen Reactions   Adhesive [Tape] Other (See Comments)    SKIN SCARRING/IRRITATION. PAPER TAPE IS OKAY    Home Medications    Prior to Admission medications   Medication Sig Start Date End Date Taking? Authorizing Provider  acetaminophen (TYLENOL) 325 MG tablet Take 1 tablet by mouth every 6 (six) hours as needed for moderate pain. 07/22/14   [provider]  albuterol (VENTOLIN HFA) 108 (90 Base) MCG/ACT inhaler Inhale 2 puffs into the lungs every 4 (four) hours as needed (for cough).    [provider]  amLODipine (NORVASC) 10 MG tablet Take 10 mg by mouth daily.    [provider]  aspirin EC 81 MG EC tablet Take 1 tablet (81 mg total) by mouth daily. Swallow whole. 03/30/21   Hughie Closs, MD  atorvastatin (LIPITOR) 40 MG tablet Take 1 tablet (40 mg total) by mouth daily. 08/08/21   [place], Anderson Malta, FNP  Azelastine HCl 137 MCG/SPRAY SOLN Place 1 spray into both nostrils 2 (two) times daily. As needed 12/23/18   [provider]  citalopram (CELEXA) 20 MG tablet Take 1 tablet by mouth daily. 07/22/14   [provider]  ELIQUIS 5 MG TABS tablet TAKE 1 TABLET BY MOUTH TWICE A DAY 05/10/20   Dunn, Dayna N, [name]  ENTRESTO 97-103 MG TAKE 1 TABLET BY MOUTH TWICE A DAY 09/19/21   McAlhany, Nile Dear, MD  FARXIGA 10 MG TABS tablet TAKE 1 TABLET BY MOUTH DAILY BEFORE BREAKFAST. 02/14/22   Kathleene Hazel, MD  finasteride (PROSCAR) 5 MG tablet Take 5 mg by mouth daily. 02/13/17   [provider]  hydrALAZINE (APRESOLINE) 50 MG tablet TAKE 1 TABLET BY MOUTH THREE TIMES A DAY 04/21/22   Laurey Morale, MD  isosorbide mononitrate (IMDUR) 60 MG 24 hr tablet Take 1 tablet (60 mg total) by mouth daily. 03/30/21   Hughie Closs, MD  nabumetone  (RELAFEN) 500 MG tablet Take 500 mg by mouth daily.    [provider]  nitroGLYCERIN (NITROSTAT) 0.4 MG SL tablet PLACE 1 TABLET UNDER THE TONGUE EVERY 5 MINUTES AS NEEDED FOR CHEST PAIN. PLEASE SCHEDULE YEARLY APPOINTMENT FOR FUTURE REFILLS. THANK YOU 04/29/21   Kathleene Hazel, MD  spironolactone (ALDACTONE) 25 MG tablet Take 1 tablet (25 mg total) by mouth daily. 11/21/21   Kathleene Hazel, MD  tamsulosin (FLOMAX) 0.4 MG CAPS capsule Take 0.4 mg by mouth daily with breakfast.  01/17/17   [provider]    Physical Exam    Vital Signs:  Effrey Gjerde Jr. does not have vital signs available for review today.  Given telephonic nature of communication, physical exam is limited. AAOx3. NAD. Normal affect.  Speech and respirations are unlabored.  Accessory Clinical Findings    None  Assessment & Plan    1.  Preoperative Cardiovascular Risk Assessment:  According to the  Revised Cardiac Risk Index (RCRI), his Perioperative Risk of Major Cardiac Event is (%): 6.6. His Functional Capacity in METs is: 8.97 according to the Duke Activity Status Index (DASI).Therefore, based on ACC/AHA guidelines, patient would be at acceptable risk for the planned procedure without further cardiovascular testing.  The patient was advised that if he develops new symptoms prior to surgery to contact our office to arrange for a follow-up visit, and he verbalized understanding.  Per office protocol, patient can hold Eliquis for 1 day prior to procedure.   Patient will not need bridging with Lovenox (enoxaparin) around procedure.  Please resume Eliquis as soon as possible postprocedure, the discretion of the surgeon.  A copy of this note will be routed to requesting surgeon.  Time:   Today, I have spent 5 minutes with the patient with telehealth technology discussing medical history, symptoms, and management plan.     Lenna Sciara, NP  05/22/2022, 3:27 PM

## 2022-05-26 DIAGNOSIS — I10 Essential (primary) hypertension: Secondary | ICD-10-CM | POA: Diagnosis not present

## 2022-05-26 DIAGNOSIS — M25511 Pain in right shoulder: Secondary | ICD-10-CM | POA: Diagnosis not present

## 2022-05-30 ENCOUNTER — Encounter (HOSPITAL_COMMUNITY): Payer: Self-pay | Admitting: Gastroenterology

## 2022-05-30 NOTE — Progress Notes (Signed)
Attempted to obtain medical history via telephone, unable to reach at this time. HIPAA compliant voicemail message left requesting return call to pre surgical testing department.  

## 2022-06-01 ENCOUNTER — Other Ambulatory Visit: Payer: Self-pay

## 2022-06-01 ENCOUNTER — Emergency Department (HOSPITAL_COMMUNITY): Payer: Federal, State, Local not specified - PPO

## 2022-06-01 ENCOUNTER — Observation Stay (HOSPITAL_COMMUNITY)
Admission: EM | Admit: 2022-06-01 | Discharge: 2022-06-02 | Disposition: A | Discharge status: Home Health | Payer: Federal, State, Local not specified - PPO | Attending: Family Medicine | Admitting: Family Medicine

## 2022-06-01 ENCOUNTER — Encounter (HOSPITAL_COMMUNITY): Payer: Self-pay | Admitting: Student

## 2022-06-01 ENCOUNTER — Emergency Department (HOSPITAL_BASED_OUTPATIENT_CLINIC_OR_DEPARTMENT_OTHER): Payer: Federal, State, Local not specified - PPO

## 2022-06-01 ENCOUNTER — Observation Stay (HOSPITAL_COMMUNITY): Payer: Federal, State, Local not specified - PPO

## 2022-06-01 DIAGNOSIS — I10 Essential (primary) hypertension: Secondary | ICD-10-CM | POA: Diagnosis present

## 2022-06-01 DIAGNOSIS — I5189 Other ill-defined heart diseases: Secondary | ICD-10-CM | POA: Diagnosis not present

## 2022-06-01 DIAGNOSIS — R29810 Facial weakness: Secondary | ICD-10-CM | POA: Diagnosis not present

## 2022-06-01 DIAGNOSIS — R29818 Other symptoms and signs involving the nervous system: Secondary | ICD-10-CM | POA: Diagnosis not present

## 2022-06-01 DIAGNOSIS — I130 Hypertensive heart and chronic kidney disease with heart failure and stage 1 through stage 4 chronic kidney disease, or unspecified chronic kidney disease: Secondary | ICD-10-CM | POA: Insufficient documentation

## 2022-06-01 DIAGNOSIS — N182 Chronic kidney disease, stage 2 (mild): Secondary | ICD-10-CM

## 2022-06-01 DIAGNOSIS — I639 Cerebral infarction, unspecified: Secondary | ICD-10-CM | POA: Diagnosis not present

## 2022-06-01 DIAGNOSIS — Z79899 Other long term (current) drug therapy: Secondary | ICD-10-CM | POA: Insufficient documentation

## 2022-06-01 DIAGNOSIS — Z7982 Long term (current) use of aspirin: Secondary | ICD-10-CM | POA: Diagnosis not present

## 2022-06-01 DIAGNOSIS — I5042 Chronic combined systolic (congestive) and diastolic (congestive) heart failure: Secondary | ICD-10-CM | POA: Diagnosis not present

## 2022-06-01 DIAGNOSIS — I2510 Atherosclerotic heart disease of native coronary artery without angina pectoris: Secondary | ICD-10-CM | POA: Diagnosis present

## 2022-06-01 DIAGNOSIS — G459 Transient cerebral ischemic attack, unspecified: Secondary | ICD-10-CM | POA: Diagnosis not present

## 2022-06-01 DIAGNOSIS — R001 Bradycardia, unspecified: Secondary | ICD-10-CM

## 2022-06-01 DIAGNOSIS — I6389 Other cerebral infarction: Secondary | ICD-10-CM

## 2022-06-01 DIAGNOSIS — Z7901 Long term (current) use of anticoagulants: Secondary | ICD-10-CM | POA: Diagnosis not present

## 2022-06-01 DIAGNOSIS — R531 Weakness: Secondary | ICD-10-CM | POA: Diagnosis not present

## 2022-06-01 DIAGNOSIS — E785 Hyperlipidemia, unspecified: Secondary | ICD-10-CM | POA: Diagnosis present

## 2022-06-01 DIAGNOSIS — I6601 Occlusion and stenosis of right middle cerebral artery: Secondary | ICD-10-CM | POA: Diagnosis not present

## 2022-06-01 LAB — COMPREHENSIVE METABOLIC PANEL
ALT: 13 U/L (ref 0–44)
AST: 22 U/L (ref 15–41)
Albumin: 3.7 g/dL (ref 3.5–5.0)
Alkaline Phosphatase: 56 U/L (ref 38–126)
Anion gap: 8 (ref 5–15)
BUN: 18 mg/dL (ref 6–20)
CO2: 24 mmol/L (ref 22–32)
Calcium: 8.8 mg/dL — ABNORMAL LOW (ref 8.9–10.3)
Chloride: 109 mmol/L (ref 98–111)
Creatinine, Ser: 1.51 mg/dL — ABNORMAL HIGH (ref 0.61–1.24)
GFR, Estimated: 53 mL/min — ABNORMAL LOW (ref >60)
Glucose, Bld: 121 mg/dL — ABNORMAL HIGH (ref 70–99)
Potassium: 3.5 mmol/L (ref 3.5–5.1)
Sodium: 141 mmol/L (ref 135–145)
Total Bilirubin: 0.4 mg/dL (ref 0.3–1.2)
Total Protein: 6.5 g/dL (ref 6.5–8.1)

## 2022-06-01 LAB — DIFFERENTIAL
Abs Immature Granulocytes: 0.03 10*3/uL (ref 0.00–0.07)
Basophils Absolute: 0 10*3/uL (ref 0.0–0.1)
Basophils Relative: 0 %
Eosinophils Absolute: 0.3 10*3/uL (ref 0.0–0.5)
Eosinophils Relative: 3 %
Immature Granulocytes: 0 %
Lymphocytes Relative: 23 %
Lymphs Abs: 2.3 10*3/uL (ref 0.7–4.0)
Monocytes Absolute: 0.6 10*3/uL (ref 0.1–1.0)
Monocytes Relative: 6 %
Neutro Abs: 6.8 10*3/uL (ref 1.7–7.7)
Neutrophils Relative %: 68 %

## 2022-06-01 LAB — APTT: aPTT: 25 seconds (ref 24–36)

## 2022-06-01 LAB — URINALYSIS, ROUTINE W REFLEX MICROSCOPIC
Bilirubin Urine: NEGATIVE
Glucose, UA: NEGATIVE mg/dL
Hgb urine dipstick: NEGATIVE
Ketones, ur: 5 mg/dL — AB
Leukocytes,Ua: NEGATIVE
Nitrite: NEGATIVE
Protein, ur: NEGATIVE mg/dL
Specific Gravity, Urine: 1.018 (ref 1.005–1.030)
pH: 8 (ref 5.0–8.0)

## 2022-06-01 LAB — I-STAT CHEM 8, ED
BUN: 20 mg/dL (ref 6–20)
Calcium, Ion: 1.12 mmol/L — ABNORMAL LOW (ref 1.15–1.40)
Chloride: 106 mmol/L (ref 98–111)
Creatinine, Ser: 1.4 mg/dL — ABNORMAL HIGH (ref 0.61–1.24)
Glucose, Bld: 104 mg/dL — ABNORMAL HIGH (ref 70–99)
HCT: 44 % (ref 39.0–52.0)
Hemoglobin: 15 g/dL (ref 13.0–17.0)
Potassium: 3.7 mmol/L (ref 3.5–5.1)
Sodium: 143 mmol/L (ref 135–145)
TCO2: 28 mmol/L (ref 22–32)

## 2022-06-01 LAB — HEMOGLOBIN A1C
Hgb A1c MFr Bld: 5.9 % — ABNORMAL HIGH (ref 4.8–5.6)
Mean Plasma Glucose: 122.63 mg/dL

## 2022-06-01 LAB — CBC
HCT: 44.4 % (ref 39.0–52.0)
Hemoglobin: 13.6 g/dL (ref 13.0–17.0)
MCH: 23.3 pg — ABNORMAL LOW (ref 26.0–34.0)
MCHC: 30.6 g/dL (ref 30.0–36.0)
MCV: 76.2 fL — ABNORMAL LOW (ref 80.0–100.0)
Platelets: 231 10*3/uL (ref 150–400)
RBC: 5.83 MIL/uL — ABNORMAL HIGH (ref 4.22–5.81)
RDW: 16 % — ABNORMAL HIGH (ref 11.5–15.5)
WBC: 10.1 10*3/uL (ref 4.0–10.5)
nRBC: 0 % (ref 0.0–0.2)

## 2022-06-01 LAB — RAPID URINE DRUG SCREEN, HOSP PERFORMED
Amphetamines: NOT DETECTED
Barbiturates: NOT DETECTED
Benzodiazepines: NOT DETECTED
Cocaine: NOT DETECTED
Opiates: NOT DETECTED
Tetrahydrocannabinol: POSITIVE — AB

## 2022-06-01 LAB — ETHANOL: Alcohol, Ethyl (B): <10 mg/dL (ref <10)

## 2022-06-01 LAB — ECHOCARDIOGRAM COMPLETE BUBBLE STUDY
Area-P 1/2: 2.12 cm2
S' Lateral: 3.7 cm

## 2022-06-01 LAB — MAGNESIUM: Magnesium: 2.1 mg/dL (ref 1.7–2.4)

## 2022-06-01 LAB — CBG MONITORING, ED: Glucose-Capillary: 138 mg/dL — ABNORMAL HIGH (ref 70–99)

## 2022-06-01 LAB — PROTIME-INR
INR: 1 (ref 0.8–1.2)
Prothrombin Time: 13.3 seconds (ref 11.4–15.2)

## 2022-06-01 MED ORDER — IOHEXOL 350 MG/ML SOLN
75.0000 mL | Freq: Once | INTRAVENOUS | Status: AC | PRN
  Administered 2022-06-01: 75 mL via INTRAVENOUS

## 2022-06-01 MED ORDER — APIXABAN 5 MG PO TABS
5.0000 mg | ORAL_TABLET | Freq: Two times a day (BID) | ORAL | Status: DC
  Administered 2022-06-01 – 2022-06-02 (×2): 5 mg via ORAL
  Filled 2022-06-01 (×2): qty 1

## 2022-06-01 MED ORDER — CITALOPRAM HYDROBROMIDE 40 MG PO TABS
40.0000 mg | ORAL_TABLET | Freq: Every day | ORAL | Status: DC
  Administered 2022-06-02: 40 mg via ORAL
  Filled 2022-06-01: qty 1

## 2022-06-01 MED ORDER — TAMSULOSIN HCL 0.4 MG PO CAPS
0.4000 mg | ORAL_CAPSULE | Freq: Every day | ORAL | Status: DC
  Filled 2022-06-01: qty 1

## 2022-06-01 MED ORDER — ISOSORBIDE MONONITRATE ER 30 MG PO TB24: 30.0000 mg | ORAL_TABLET | Freq: Every day | ORAL | Status: DC

## 2022-06-01 MED ORDER — POTASSIUM CHLORIDE 10 MEQ/100ML IV SOLN
10.0000 meq | INTRAVENOUS | Status: AC
  Administered 2022-06-01 (×3): 10 meq via INTRAVENOUS
  Filled 2022-06-01 (×3): qty 100

## 2022-06-01 MED ORDER — ATORVASTATIN CALCIUM 40 MG PO TABS
40.0000 mg | ORAL_TABLET | Freq: Every day | ORAL | Status: DC
  Administered 2022-06-02: 40 mg via ORAL
  Filled 2022-06-01: qty 1

## 2022-06-01 MED ORDER — FINASTERIDE 5 MG PO TABS
5.0000 mg | ORAL_TABLET | Freq: Every day | ORAL | Status: DC
  Filled 2022-06-01: qty 1

## 2022-06-01 MED ORDER — PERFLUTREN LIPID MICROSPHERE
1.0000 mL | INTRAVENOUS | Status: AC | PRN
  Administered 2022-06-01: 4 mL via INTRAVENOUS

## 2022-06-01 MED ORDER — AMLODIPINE BESYLATE 10 MG PO TABS: 10.0000 mg | ORAL_TABLET | Freq: Every day | ORAL | Status: DC

## 2022-06-01 MED ORDER — ASPIRIN 81 MG PO TBEC
81.0000 mg | DELAYED_RELEASE_TABLET | Freq: Every day | ORAL | Status: DC
  Administered 2022-06-02: 81 mg via ORAL
  Filled 2022-06-01: qty 1

## 2022-06-01 MED ORDER — STROKE: EARLY STAGES OF RECOVERY BOOK
Freq: Once | Status: AC
  Administered 2022-06-02: 1
  Filled 2022-06-01: qty 1

## 2022-06-01 MED ORDER — HYDRALAZINE HCL 50 MG PO TABS
50.0000 mg | ORAL_TABLET | Freq: Three times a day (TID) | ORAL | Status: DC
  Administered 2022-06-01: 50 mg via ORAL
  Filled 2022-06-01: qty 1

## 2022-06-01 NOTE — Assessment & Plan Note (Addendum)
Around 34 in ED. EKG with sinus rhythm and prolonged PR. ICD was discussed with HF team 08/08/2021 however patient declined at that time. -avoid beta blockers -monitor on tele

## 2022-06-01 NOTE — Assessment & Plan Note (Addendum)
299B -716R systolics. Permissive HTN ok but gradually normalize in 5-7 days  -Held home amlodipine 10 mg daily

## 2022-06-01 NOTE — Assessment & Plan Note (Addendum)
EF 30 to 35% with apical akinesis 06/01/2022.  Followed by cardiology and heart failure outpatient. -Continue Hydralazine 3 times daily -Continue Imdur 30 mg daily -Hold Spironolactone, Entresto, farxiga given rise in Cr

## 2022-06-01 NOTE — Assessment & Plan Note (Deleted)
Prior echo showed 11/22 apical akinesis which could cause embolic stroke

## 2022-06-01 NOTE — Plan of Care (Signed)

## 2022-06-01 NOTE — H&P (Signed)
Hospital Admission History and Physical Service Pager: [phone]  Patient name: Austin Ingram. Medical record number: [id number] Date of Birth: 1960/11/29 Age: 62 y.o. Gender: male  Primary Care Provider: Alroy Dust, L.Marlou Sa, MD Consultants: neurology Code Status: FULL  Preferred Emergency Contact:  Contact Information     Name Relation Home Work Mobile   [place] Spouse [phone]                      Chief Complaint: left-sided facial weakness, arm weakness, leg weakness   Assessment and Plan: Austin Lininger Brooke Ingram. is a 62 y.o. male PMH of LV mural thrombus on eliquis, HTN, HLD, CKD, HF (last echo 2022 with EF 30-35%, no thrombus but had apical akinesis), CAD s/p PCI 2004 RCA and 2008 LAD, OSA presenting with left-sided facial weakness, arm weakness, leg weakness.  Differential for presentation of this includes TIA vs stroke (possibly thromboembolic from apical akinesis).   * TIA (transient ischemic attack) Lasted approximately 90 minutes with full resolution by admission. CT head with hypoattenuation of right insula - possible artifact. ABCD2 score 2-Day Stroke Risk: 4.1%, 7-Day Stroke Risk: 5.9%, 90-Day Stroke Risk: 9.8%. - Admit to FMTS, progrssive, attending Dr. Ardelia Mems - Neurology following, appreciate recs - PT/OT consulted, appreciate recs - F/u MRI, CT angio - F/u echo w/ bubble study - F/u risk stratification labs  - Passed bedside swallow  - Telemetry - Neuro checks q2h  Cardiac akinesia Echo in 11/22 patient with apical akinesis with no thrombus. Possible cardioembolic source of TIA. Cardiology notes recommend continuing Eliquis 07/19/2021. - Continue Eliquis 5 mg daily - F/u echo w/ bubble study  Chronic combined systolic and diastolic heart failure (HCC) Last EF 30 to 35% with apical akinesis 2022.  Followed by cardiology and heart failure outpatient. -Continue Hydralazine 3 times daily -Continue Imdur 30 mg daily -Hold  Spironolactone, Entresto, farxiga given rise in Cr  CAD (coronary artery disease) Hx of anterior MI 2008, cath RCA 10/18 and repeat cath 11/22 with total occlusion of RCA-patent stents in LAD, Lcx and prox RCA. -continue ASA 81 mg -continue atorvastatin 40 mg daily    Sinus bradycardia Around 49 in ED. EKG with sinus rhythm and prolonged PR. ICD was discussed with HF team 08/08/2021 however patient declined at that time. -avoid beta blockers -monitor on tele  HTN (hypertension) 710G-269S systolics here.  -Continue home amlodipine 10 mg daily  CKD (chronic kidney disease), stage II Cr 1.4 on admission. Last Cr in 2023 was 1.21. - Avoid nephrotoxic agents - Encourage increased PO intake after passing swallow - F/u repeat BMP in AM   Other chronic problems: OSA HLD  FEN/GI: NPO pending swallow eval VTE Prophylaxis: Eliquis  Disposition: Progressive  History of Present Illness:  Austin Jun Brooke Ingram. is a 62 y.o. male presenting with PMHx CAD s/p BMS-LAD in 2008, DES-P RCA in 2014, DES-LCx in 2018, ICM, chronic combined systolic and diastolic heart failure, LV thrombus on eliquis, sinus bradycardia, hypertension, hyperlipidemia, CKD stage II, and OSA   Patient says that around 1030 this morning he said that he took a hot shower and when he was getting out he got his washcloth and was trying to pass it to his left hand but could not grip it with his left hand and dropped it.  He said that he tried to bend over and grab it but then he started feeling dizzy and he could not grab it with his left hand.  He  then tried to step out of the shower and was able to step out with his right foot but the left foot stumbled over the shower and he fell down.  He did not lose consciousness.  He called his wife and his wife came back to the house and he was on the floor for around 30 to 40 minutes. At that time they called EMS. His wife said his voice was slurred while he was on the phone.  He said the whole  left side of his body felt numb including his tongue, face, left arm and left leg.  By the time he got to the hospital he felt like all of his symptoms had resolved.  He attests to taking all of his medications this morning and says that he has not missed any doses of his Eliquis.  In the ED, patient was back to baseline. His symptoms reportedly resolved en-route to the hospital. Code stroke was called. He was ineligible for TNK given rapid resolution of symptoms and negative head imaging. He is admitted for continued TIA workup per neurology.  Review Of Systems: Per HPI  Pertinent Past Medical History: Remainder reviewed in history tab.   Pertinent Past Surgical History:  Remainder reviewed in history tab.   Pertinent Social History: Tobacco use: Former smoker (1-2 years) Alcohol use: recreational, occasionally, last drink past weekend Other Substance use: no IVDU, occasional [place] with wife, at baseline able to do everything on own including walking  Pertinent Family History: Remainder reviewed in history tab.   Important Outpatient Medications: Confirmed in room with patient Amlodipine 10  today ASA 81 mg daily Atorvastatin 40 mg daily Celexa 40 mg Eliquis 5 mg BID Entresto BID Etodolac 400 mg BID-shoulder pain   Farxiga 10 mg daily Finasteride Flonase Hydralazine 10 TID  Imdur unsure of dosage (last HF note says 30 mg) Spirinolactone 25 mg daily Tamsulosin  Remainder reviewed in medication history.   Objective: BP (!) 163/97 (BP Location: Left Arm)   Pulse 72   Temp 98.7 F (37.1 C) (Oral)   Resp 14   Wt 90 kg   SpO2 100%   BMI 29.30 kg/m  Exam: General: well-appearing and in no acute distress HEENT: normocephalic and atraumatic Respiratory: CTAB anteriorly, CTAB posteriorly, normal respiratory effort, and on RA Extremities: moving all extremities spontaneously, palpable pulses on UEs, and palpable pulses on LEs Gastrointestinal: non-tender,  non-distended, and no rebound tenderness or guarding Cardiovascular: bradycardic, sinus, no m/r/g Neuro: CN II: PERRL CN III, IV,VI: EOMI CV V: Normal sensation in V1, V2, V3 (says slightly diminished on left V1) CVII: Symmetric smile and brow raise CN VIII: Normal hearing CN IX,X: Symmetric palate raise  CN XI: 5/5 shoulder shrug CN XII: Symmetric tongue protrusion  UE and LE strength 5/5 Normal sensation in UE and LE bilaterally  No ataxia with finger to nose  Labs:  CBC BMET  Recent Labs  Lab 06/01/22 1213 06/01/22 1250  WBC 10.1  --   HGB 13.6 15.0  HCT 44.4 44.0  PLT 231  --    Recent Labs  Lab 06/01/22 1213 06/01/22 1250  NA 141 143  K 3.5 3.7  CL 109 106  CO2 24  --   BUN 18 20  CREATININE 1.51* 1.40*  GLUCOSE 121* 104*  CALCIUM 8.8*  --      EKG: sinus rhythm, prolonged PR interval, L axis deviation  Imaging Studies Performed: CT head code stroke (2/1) IMPRESSION: 1. Possible hypoattenuation in  the posterior aspect of the right insula, which could represent acute or subacute infarct in the correct clinical setting. MRI could further evaluate if clinically warranted. 2. ASPECTS is 9  MR brain w/o contrast pending CT angio head and neck w w/o contrast read pending Echo w/ bubble study pending  Gerrit Heck, MD 06/01/2022, 5:05 PM PGY-2, [place] Intern pager: [phone], text pages welcome Secure chat group [place]

## 2022-06-01 NOTE — Assessment & Plan Note (Addendum)
Lasted approximately 90 minutes with full resolution by admission. MRI brain small acute v subacute infarct of posterior limb of internal capsule. CT angio shows occlusion of a right M3 branch in the insula. Per neuro continue Eliquis vs switch to coumadin pending cardiology input. ABCD2 score 2-Day Stroke Risk: 4.1%, 7-Day Stroke Risk: 5.9%, 90-Day Stroke Risk: 9.8%. - Admit to FMTS, progrssive, attending Dr. Ardelia Mems - Neurology following, appreciate recs - PT/OT consulted, appreciate recs - F/u risk stratification labs  - Telemetry - Neuro checks q4h

## 2022-06-01 NOTE — Assessment & Plan Note (Deleted)
Echo in 11/22 showed EF 30%

## 2022-06-01 NOTE — Hospital Course (Addendum)
Austin Ingram. is a 62 y.o. male PMH of LV mural thrombus on eliquis, HTN, HLD, CKD, HF (last echo 2022 with EF 30-35%, no thrombus but had apical akinesis), CAD s/p PCI 2004 RCA and 2008 LAD, OSA presenting with left-sided facial weakness, arm weakness, leg weakness.   Stroke, likely cardioembolic Cardiac akinesia  Pt presented w/ ~51min of  sided facial, arm, and leg weakness that was fully resolved by admission. Neurology was consulted. CT head and MRI brain showed acute vs subacute infarct of posterior limb of internal capsule. CT angio head and neck showed Occlusion of a right M3 branch in the insula. Echo showed EF 30 - 35% with apical akinesis. Per neurology and cardiolgy recs, was transitioned from home apixaban to lovenox bridge to warfarin. Followed by cardiology and heart failure outpatient.  Combined Systolic and Diastolic Heart Failure (EF 30-35%)  Echo showed EF 30-35% and apical akinesis, similar to prior. Held spironolactone, Entresto, and Farxiga due to Cr rise. Hydralazine and Imdur continued during hospitalization.  All other conditions chronic and stable  CAD Present on admission. On discharge aspirin and plavix were discontinued. Patient to be started on warfarin.  HLD LDL 137 during admission, home atorvastatin dose increased to 80 mg, with LDL goal < 70.  CKD Cr 1.4 on admission, downtrended to 1.25 on discharge. Baseline Cr 2023 was 1.21.  Other chronic problems Sinus bradycardia HTN OSA  PCP Follow-up: Be sure patient has f/u w/ Neurology, patient d/c before was scheduled Recheck Cr, was elevated during admission  Strict BP and lipids control s/p stroke Recheck INR

## 2022-06-01 NOTE — Assessment & Plan Note (Addendum)
Hx of anterior MI 2008, cath RCA 10/18 and repeat cath 11/22 with total occlusion of RCA-patent stents in LAD, Lcx and prox RCA. -continue ASA 81 mg -increased atorvastatin from 40 to 80 mg daily

## 2022-06-01 NOTE — Progress Notes (Signed)
  Echocardiogram 2D Echocardiogram has been performed.  [name] 06/01/2022, 4:08 PM

## 2022-06-01 NOTE — ED Notes (Signed)
Family brought up to 3W waiting area

## 2022-06-01 NOTE — Assessment & Plan Note (Deleted)
Multiple prior stenting procedures

## 2022-06-01 NOTE — Assessment & Plan Note (Addendum)
Echo 06/01/2022 apical akinesis. Possible cardioembolic source of TIA. Cardiology notes recommend continuing Eliquis 07/19/2021. Per neuro continue Eliquis vs switch to coumadin pending cardiology input. - Continue Eliquis 5 mg daily

## 2022-06-01 NOTE — ED Triage Notes (Signed)
Pt BIB by [facility] from home. Pt was in the shower and dropped his wash cloth, when he tried to pick it up and grab it pt "felt like his brain could not tell his hand to grab the washcloth." Pt experienced left arm and leg weakness, as well as facial droop on the left side. Pt last known fell was 10:30am. Pt called his wife & EMS at 1050. Pt was feeling nauseous and had 4mg  of zofran. Pt has an 18g in RAC.   LVS  156/90  98% SATs  153 cbg  HR 52

## 2022-06-01 NOTE — ED Provider Notes (Addendum)
[facility] Provider Note   CSN: [id number] Arrival date & time: 06/01/22  1153  An emergency department physician performed an initial assessment on this suspected stroke patient at 1154.  History  No chief complaint on file.   Austin Schwartz [name]. is a 62 y.o. male.  Patient here as a code stroke with left-sided facial weakness, arm weakness, leg weakness that started at 10:30 AM that resolved after 90 minutes.  Patient on Eliquis due to LV mural thrombus, history of hypertension, high cholesterol.  He is asymptomatic now.  Neurology at the bedside upon arrival.  Patient did not notice any sensation issues or speech issues or vision issues during his episode.  No history of stroke.  The history is provided by the patient.       Home Medications Prior to Admission medications   Medication Sig Start Date End Date Taking? Authorizing Provider  acetaminophen (TYLENOL) 325 MG tablet Take 1 tablet by mouth every 6 (six) hours as needed for moderate pain. 07/22/14   [provider]  albuterol (VENTOLIN HFA) 108 (90 Base) MCG/ACT inhaler Inhale 2 puffs into the lungs every 4 (four) hours as needed (for cough).    [provider]  amLODipine (NORVASC) 10 MG tablet Take 10 mg by mouth daily.    [provider]  aspirin EC 81 MG EC tablet Take 1 tablet (81 mg total) by mouth daily. Swallow whole. 03/30/21   Darliss Cheney, MD  atorvastatin (LIPITOR) 40 MG tablet Take 1 tablet (40 mg total) by mouth daily. 08/08/21   [place], Maricela Bo, FNP  Azelastine HCl 137 MCG/SPRAY SOLN Place 1 spray into both nostrils 2 (two) times daily. As needed 12/23/18   [provider]  citalopram (CELEXA) 20 MG tablet Take 1 tablet by mouth daily. 07/22/14   [provider]  ELIQUIS 5 MG TABS tablet TAKE 1 TABLET BY MOUTH TWICE A DAY 05/10/20   Dunn, Dayna N, [name]  ENTRESTO 97-103 MG TAKE 1 TABLET BY MOUTH TWICE A DAY 09/19/21   McAlhany,  Annita Brod, MD  FARXIGA 10 MG TABS tablet TAKE 1 TABLET BY MOUTH DAILY BEFORE BREAKFAST. 02/14/22   Burnell Blanks, MD  finasteride (PROSCAR) 5 MG tablet Take 5 mg by mouth daily. 02/13/17   [provider]  hydrALAZINE (APRESOLINE) 50 MG tablet TAKE 1 TABLET BY MOUTH THREE TIMES A DAY 04/21/22   Larey Dresser, MD  isosorbide mononitrate (IMDUR) 60 MG 24 hr tablet Take 1 tablet (60 mg total) by mouth daily. 03/30/21   Darliss Cheney, MD  nabumetone (RELAFEN) 500 MG tablet Take 500 mg by mouth daily.    [provider]  nitroGLYCERIN (NITROSTAT) 0.4 MG SL tablet PLACE 1 TABLET UNDER THE TONGUE EVERY 5 MINUTES AS NEEDED FOR CHEST PAIN. PLEASE SCHEDULE YEARLY APPOINTMENT FOR FUTURE REFILLS. [place] YOU 04/29/21   Burnell Blanks, MD  spironolactone (ALDACTONE) 25 MG tablet Take 1 tablet (25 mg total) by mouth daily. 11/21/21   Burnell Blanks, MD  tamsulosin (FLOMAX) 0.4 MG CAPS capsule Take 0.4 mg by mouth daily with breakfast.  01/17/17   [provider]      Allergies    Adhesive [tape]    Review of Systems   Review of Systems  Physical Exam Updated Vital Signs BP 124/77 (BP Location: Left Arm)   Temp 97.8 F (36.6 C) (Oral)   Resp 12   Wt 90 kg   SpO2 99%  BMI 29.30 kg/m  Physical Exam Vitals and nursing note reviewed.  Constitutional:      General: He is not in acute distress.    Appearance: He is well-developed. He is not ill-appearing.  HENT:     Head: Normocephalic and atraumatic.     Nose: Nose normal.     Mouth/Throat:     Mouth: Mucous membranes are moist.  Eyes:     Extraocular Movements: Extraocular movements intact.     Conjunctiva/sclera: Conjunctivae normal.     Pupils: Pupils are equal, round, and reactive to light.  Cardiovascular:     Rate and Rhythm: Normal rate and regular rhythm.     Pulses: Normal pulses.     Heart sounds: Normal heart sounds. No murmur heard. Pulmonary:     Effort: Pulmonary  effort is normal. No respiratory distress.     Breath sounds: Normal breath sounds.  Abdominal:     Palpations: Abdomen is soft.     Tenderness: There is no abdominal tenderness.  Musculoskeletal:        General: No swelling.     Cervical back: Normal range of motion and neck supple.  Skin:    General: Skin is warm and dry.     Capillary Refill: Capillary refill takes less than 2 seconds.  Neurological:     General: No focal deficit present.     Mental Status: He is alert and oriented to person, place, and time.     Cranial Nerves: No cranial nerve deficit.     Sensory: No sensory deficit.     Motor: No weakness.     Coordination: Coordination normal.  Psychiatric:        Mood and Affect: Mood normal.     ED Results / Procedures / Treatments   Labs (all labs ordered are listed, but only abnormal results are displayed) Labs Reviewed  CBC - Abnormal; Notable for the following components:      Result Value   RBC 5.83 (*)    MCV 76.2 (*)    MCH 23.3 (*)    RDW 16.0 (*)    All other components within normal limits  I-STAT CHEM 8, ED - Abnormal; Notable for the following components:   Creatinine, Ser 1.40 (*)    Glucose, Bld 104 (*)    Calcium, Ion 1.12 (*)    All other components within normal limits  CBG MONITORING, ED - Abnormal; Notable for the following components:   Glucose-Capillary 138 (*)    All other components within normal limits  DIFFERENTIAL  ETHANOL  COMPREHENSIVE METABOLIC PANEL  RAPID URINE DRUG SCREEN, HOSP PERFORMED  URINALYSIS, ROUTINE W REFLEX MICROSCOPIC  HEMOGLOBIN A1C  PROTIME-INR  APTT    EKG EKG Interpretation  Date/Time:  Thursday June 01 2022 12:09:34 EST Ventricular Rate:  52 PR Interval:  211 QRS Duration: 89 QT Interval:  417 QTC Calculation: 388 R Axis:   -37 Text Interpretation: Sinus rhythm Prolonged PR interval Left axis deviation Confirmed by Lennice Sites ([id number]) on 06/01/2022 12:19:22 PM  Radiology CT HEAD CODE STROKE  WO CONTRAST  Result Date: 06/01/2022 CLINICAL DATA:  Code stroke.  Neuro deficit, acute, stroke suspected EXAM: CT HEAD WITHOUT CONTRAST TECHNIQUE: Contiguous axial images were obtained from the base of the skull through the vertex without intravenous contrast. RADIATION DOSE REDUCTION: This exam was performed according to the departmental dose-optimization program which includes automated exposure control, adjustment of the mA and/or kV according to patient size and/or use of iterative  reconstruction technique. COMPARISON:  None Available. FINDINGS: Brain: Possible hypoattenuation in the posterior aspect of the right insula. No evidence of acute hemorrhage, mass lesion, midline shift or hydrocephalus. Vascular: No hyperdense vessel identified. Skull: No acute fracture. Sinuses/Orbits: Sinuses are largely clear. No acute orbital findings. Other: No mastoid effusions. ASPECTS [facility] Stroke Program Early CT Score) total score (0-10 with 10 being normal): 9. IMPRESSION: 1. Possible hypoattenuation in the posterior aspect of the right insula, which could represent acute or subacute infarct in the correct clinical setting. MRI could further evaluate if clinically warranted. 2. ASPECTS is 9 Code stroke imaging results were communicated on 06/01/2022 at 12:09 pm to provider Dr. Lorrin Goodell via telephone, who verbally acknowledged these results. Electronically Signed   By: Margaretha Sheffield M.D.   On: 06/01/2022 12:09    Procedures Procedures    Medications Ordered in ED Medications   stroke: early stages of recovery book (has no administration in time range)    ED Course/ Medical Decision Making/ A&P                             Medical Decision Making Amount and/or Complexity of Data Reviewed Labs: ordered. Radiology: ordered.  Risk Decision regarding hospitalization.   Austin Edgington [name]. is here as a code stroke.  Normal vitals.  No fever.  Left-sided weakness of his face, arm, leg for 90 minutes that  started at 10:30 AM.  Resolved now.  Got better and route with EMS.  Code stroke workup still initiated.  Head CT per neurology review is unremarkable.  He is on Eliquis.  Would not get TNK as he took Eliquis this morning.  However if he redevelops symptoms may need thrombectomy candidate.  Basic labs were collected and per my review interpretation are unremarkable.  No significant anemia, electrolyte abnormality, kidney injury.  EKG per my review interpretation shows sinus rhythm.  No ischemic changes.  Patient with multiple stroke risk factors.  Will be admitted to medicine per neurology recommendations for TIA workup.  Hemodynamically stable and neurologically stable throughout my care.   This chart was dictated using voice recognition software.  Despite best efforts to proofread,  errors can occur which can change the documentation meaning.     Final Clinical Impression(s) / ED Diagnoses Final diagnoses:  TIA (transient ischemic attack)    Rx / DC Orders ED Discharge Orders     None         Lennice Sites, DO 06/01/22 [place], [place], DO 06/01/22 1307

## 2022-06-01 NOTE — Progress Notes (Signed)
Pt admitted to 3W4.  A&O x 4. Telemetry placed on patient, CCMD called and second verification completed.  Passed Yale swallow.  Denies pain.  Family at bedside.  Bed alarm on, call bell within reach and verbalizes understanding to call before attempting to get OOB.

## 2022-06-01 NOTE — Assessment & Plan Note (Addendum)
Cr 1.4 on admission. Last Cr in 2023 was 1.21. Now 1.25 and closer to baseline. - Avoid nephrotoxic agents - Encourage increased PO intake

## 2022-06-01 NOTE — Consult Note (Signed)
Neurology Consultation  Reason for Consult:  Code Stroke Referring Physician: Lockie Mola  CC: Left sided weakness  History is obtained from:Patient   HPI: Austin Legendre Montez Hageman. is a 62 y.o. male with a past medical history of CAD s/p BMS-LAD in 2008, DES-P RCA in 2014, DES-LCx in 2018, ICM, chronic combined systolic and diastolic heart failure, LV thrombus on eliquis, sinus bradycardia, hypertension, hyperlipidemia, CKD stage II, and OSA. He went to take a shower at 1030. While he was in the shower when he dropped the wash cloth and then noticed weakness on the left side. On EMS arrival he had a facial droop and left sided weakness in both his upper and lower extremity. He did take all of his medication today including his Eliquis.    LKW: 1030 tpa given?: no, on Eliquis  Premorbid modified Rankin scale (mRS):  0-Completely asymptomatic and back to baseline post-stroke  ROS: Full ROS was performed and is negative except as noted in the HPI.   Past Medical History:  Diagnosis Date   Allergic rhinitis    Anxiety    Arthritis    "lower back, right thumb, knees" (10/04/2012)   Asthma    "grew out of it" (10/04/2012)   CKD (chronic kidney disease), stage II    Coronary artery disease    a. s/p prior stent to LAD;  b. LHC 6/14: DES to pRCA. c. DES to dCx 01/2017 with residual disease.   Depression    Hyperlipidemia    Hypertension    Ischemic cardiomyopathy    LV (left ventricular) mural thrombus    MI (myocardial infarction) (HCC) 03/2007   OSA (obstructive sleep apnea)    mild   Pneumonia    "as a child" (10/04/2012)   Sinus bradycardia      Family History  Problem Relation Age of Onset   Atrial fibrillation Mother        irregular heart beats   Hypertension Mother    Diabetes type II Mother    CAD Maternal Grandfather    CAD Maternal Grandmother      Social History:   reports that he quit smoking about 23 years ago. His smoking use included cigarettes. He has a 10.00 pack-year  smoking history. He has never used smokeless tobacco. He reports current alcohol use. He reports current drug use. Frequency: 14.00 times per week. Drug: Marijuana.  Medications No current facility-administered medications for this encounter.  Current Outpatient Medications:    acetaminophen (TYLENOL) 325 MG tablet, Take 1 tablet by mouth every 6 (six) hours as needed for moderate pain., Disp: , Rfl:    albuterol (VENTOLIN HFA) 108 (90 Base) MCG/ACT inhaler, Inhale 2 puffs into the lungs every 4 (four) hours as needed (for cough)., Disp: , Rfl:    amLODipine (NORVASC) 10 MG tablet, Take 10 mg by mouth daily., Disp: , Rfl:    aspirin EC 81 MG EC tablet, Take 1 tablet (81 mg total) by mouth daily. Swallow whole., Disp: 30 tablet, Rfl: 11   atorvastatin (LIPITOR) 40 MG tablet, Take 1 tablet (40 mg total) by mouth daily., Disp: 90 tablet, Rfl: 1   Azelastine HCl 137 MCG/SPRAY SOLN, Place 1 spray into both nostrils 2 (two) times daily. As needed, Disp: , Rfl:    citalopram (CELEXA) 20 MG tablet, Take 1 tablet by mouth daily., Disp: , Rfl:    ELIQUIS 5 MG TABS tablet, TAKE 1 TABLET BY MOUTH TWICE A DAY, Disp: 60 tablet, Rfl: 5   ENTRESTO  97-103 MG, TAKE 1 TABLET BY MOUTH TWICE A DAY, Disp: 180 tablet, Rfl: 3   FARXIGA 10 MG TABS tablet, TAKE 1 TABLET BY MOUTH DAILY BEFORE BREAKFAST., Disp: 30 tablet, Rfl: 4   finasteride (PROSCAR) 5 MG tablet, Take 5 mg by mouth daily., Disp: , Rfl:    hydrALAZINE (APRESOLINE) 50 MG tablet, TAKE 1 TABLET BY MOUTH THREE TIMES A DAY, Disp: 270 tablet, Rfl: 3   isosorbide mononitrate (IMDUR) 60 MG 24 hr tablet, Take 1 tablet (60 mg total) by mouth daily., Disp: 30 tablet, Rfl: 1   nabumetone (RELAFEN) 500 MG tablet, Take 500 mg by mouth daily., Disp: , Rfl:    nitroGLYCERIN (NITROSTAT) 0.4 MG SL tablet, PLACE 1 TABLET UNDER THE TONGUE EVERY 5 MINUTES AS NEEDED FOR CHEST PAIN. PLEASE SCHEDULE YEARLY APPOINTMENT FOR FUTURE REFILLS. THANK YOU, Disp: 75 tablet, Rfl: 0    spironolactone (ALDACTONE) 25 MG tablet, Take 1 tablet (25 mg total) by mouth daily., Disp: 90 tablet, Rfl: 2   tamsulosin (FLOMAX) 0.4 MG CAPS capsule, Take 0.4 mg by mouth daily with breakfast. , Disp: , Rfl:   Facility-Administered Medications Ordered in Other Encounters:    regadenoson (LEXISCAN) injection SOLN 0.4 mg, 0.4 mg, Intravenous, Once, Crenshaw, Denice Bors, MD   technetium tetrofosmin (TC-MYOVIEW) injection 43.1 millicurie, 54.0 millicurie, Intravenous, Once PRN, Stanford Breed, Denice Bors, MD   Exam: Current vital signs: There were no vitals taken for this visit. Vital signs in last 24 hours:    GENERAL: Awake, alert in NAD HEENT: - Normocephalic and atraumatic, dry mm, no LN++, no Thyromegally LUNGS - Clear to auscultation bilaterally with no wheezes CV - S1S2 RRR, no m/r/g, equal pulses bilaterally. ABDOMEN - Soft, nontender, nondistended with normoactive BS Ext: warm, well perfused, intact peripheral pulses, no edema  NEURO:  Mental Status: AA&Ox3  Language: speech is clear.  Naming, repetition, fluency, and comprehension intact. Cranial Nerves: PERRL 3 mm/brisk. EOMI, visual fields full, no facial asymmetry, facial sensation intact, hearing intact, tongue/uvula/soft palate midline, normal sternocleidomastoid and trapezius muscle strength. No evidence of tongue atrophy or fasciculations Motor:  Tone: is normal and bulk is normal Sensation- Intact to light touch bilaterally Coordination: FTN intact bilaterally, no ataxia in BLE. Gait- deferred  NIHSS components Score: Comment  1a Level of Conscious 0[x]  1[]  2[]  3[]         1b LOC Questions 0[x]  1[]  2[]           1c LOC Commands 0[x]  1[]  2[]           2 Best Gaze 0[x]  1[]  2[]           3 Visual 0[x]  1[]  2[]  3[]         4 Facial Palsy 0[x]  1[]  2[]  3[]         5a Motor Arm - left 0[x]  1[]  2[]  3[]  4[]  UN[]     5b Motor Arm - Right 0[x]  1[]  2[]  3[]  4[]  UN[]     6a Motor Leg - Left 0[x]  1[]  2[]  3[]  4[]  UN[]     6b Motor Leg - Right  0[x]  1[]  2[]  3[]  4[]  UN[]     7 Limb Ataxia 0[x]  1[]  2[]  3[]  UN[]       8 Sensory 0[x]  1[]  2[]  UN[]         9 Best Language 0[x]  1[]  2[]  3[]         10 Dysarthria 0[x]  1[]  2[]  UN[]         11 Extinct. and Inattention 0[x]  1[]  2[]   TOTAL: 0        Labs I have reviewed labs in epic and the results pertinent to this consultation are:   CBC    Component Value Date/Time   WBC 5.8 03/29/2021 0229   RBC 5.57 03/29/2021 0229   HGB 12.9 (L) 03/29/2021 0229   HGB 13.3 08/12/2020 1028   HCT 41.2 03/29/2021 0229   HCT 43.2 08/12/2020 1028   PLT 162 03/29/2021 0229   PLT 269 08/12/2020 1028   MCV 74.0 (L) 03/29/2021 0229   MCV 73 (L) 08/12/2020 1028   MCH 23.2 (L) 03/29/2021 0229   MCHC 31.3 03/29/2021 0229   RDW 15.2 03/29/2021 0229   RDW 15.7 (H) 08/12/2020 1028   LYMPHSABS 2.2 03/28/2021 0455   MONOABS 0.6 03/28/2021 0455   EOSABS 0.0 03/28/2021 0455   BASOSABS 0.0 03/28/2021 0455    CMP     Component Value Date/Time   NA 141 08/08/2021 1200   NA 141 01/31/2021 1102   K 4.3 08/08/2021 1200   CL 110 08/08/2021 1200   CO2 26 08/08/2021 1200   GLUCOSE 120 (H) 08/08/2021 1200   BUN 19 08/08/2021 1200   BUN 15 01/31/2021 1102   CREATININE 1.26 (H) 08/08/2021 1200   CALCIUM 9.0 08/08/2021 1200   PROT 6.6 08/08/2021 1200   PROT 6.3 01/31/2021 1102   ALBUMIN 3.8 08/08/2021 1200   ALBUMIN 4.2 01/31/2021 1102   AST 23 08/08/2021 1200   ALT 17 08/08/2021 1200   ALKPHOS 67 08/08/2021 1200   BILITOT 0.4 08/08/2021 1200   BILITOT 0.2 01/31/2021 1102   GFRNONAA >60 08/08/2021 1200   GFRAA 72 04/10/2019 1538    Lipid Panel     Component Value Date/Time   CHOL 121 05/23/2021 1150   CHOL 136 01/31/2021 1102   TRIG 38 05/23/2021 1150   HDL 43 05/23/2021 1150   HDL 42 01/31/2021 1102   CHOLHDL 2.8 05/23/2021 1150   VLDL 8 05/23/2021 1150   LDLCALC 70 05/23/2021 1150   LDLCALC 84 01/31/2021 1102   LDLDIRECT 59.9 06/03/2007 1114     Imaging I have reviewed the  images obtained:  CT-head 1. Possible hypoattenuation in the posterior aspect of the right insula, which could represent acute or subacute infarct in the correct clinical setting. - artifact? 2. ASPECTS is 9  Assessment:  62 y.o. male with a past medical history of CAD s/p BMS-LAD in 2008, DES-P RCA in 2014, DES-LCx in 2018, ICM, chronic combined systolic and diastolic heart failure, LV thrombus on eliquis, sinus bradycardia, hypertension, hyperlipidemia, CKD stage II, and OSA. He went to take a shower at 1030. While he was in the shower when he dropped the wash cloth and then noticed weakness on the left side. On EMS arrival he had a facial droop and left sided weakness in both his upper and lower extremity. He did take all of his medication today including his Eliquis. Symptoms resolved in route to the hospital. He should be admitted for a TIA work up and closely monitored given his stroke risk factors.   Impression: TIA  Recommendations: - HgbA1c, fasting lipid panel - MRI of the brain without contrast - CTA head and neck  - Frequent neuro checks - Echocardiogram with bubble - Continue Eliquis  - Risk factor modification - Telemetry monitoring - PT consult, OT consult, Speech consult - Stroke team to follow    Patient seen and examined by NP/APP with MD. MD to update note as needed.  Janine Ores, DNP, FNP-BC [facility] Pager: [phone]   NEUROHOSPITALIST ADDENDUM Performed a face to face diagnostic evaluation.   I have reviewed the contents of history and physical exam as documented by PA/ARNP/Resident and agree with above documentation.  I have discussed and formulated the above plan as documented. Edits to the note have been made as needed.  Impression/Key exam findings/Plan: 58F with hx of LV thrombus on eliquis and compliant and took last dose this AM. He presents with sudden onset L sided weakness and numbness with L facial droop. Symptoms started in  the shower. He immediately called 911 and was noted to be weak on the left. He was brought in as a code stroke. Symptoms had completely resolved before arrival to the ED. Lasted between an hour and a hour and half and now back to baseline.  Symptoms concerning for a TIA. Not a candidate for tnkase or thrombectomy 2/2 resolution of symptoms. He is also on eliquis which makes him ineligible for tnkase.  I spoke with patient in detail at the bedside and asked him to call the RN right away if he has recurrence of symptoms. Recommend medicine admission with stroke workup.  Donnetta Simpers, MD [facility] [phone]   If 7pm to 7am, please call on call as listed on AMION.

## 2022-06-01 NOTE — Progress Notes (Signed)
[facility] Interim Progress Note  S:Night rounded w/ Dr. Rock Nephew. Pt was alert and no complaints. Feels like he is back to normal. Face symmetrical.  O: BP 125/80 (BP Location: Left Arm)   Pulse (!) 55   Temp 99.1 F (37.3 C) (Oral)   Resp 16   Wt 90 kg   SpO2 99%   BMI 29.30 kg/m     A/P: Pt is stable and currently w/o neuro deficit. Pending MRI results. - Rest of plan per day team.  Arlyce Dice, MD 06/01/2022, 8:13 PM PGY-1, [facility] pager [phone]

## 2022-06-01 NOTE — ED Notes (Signed)
ED TO INPATIENT HANDOFF REPORT  ED Nurse Name and Phone #: Hansel Starling [phone]  S Name/Age/Gender Austin Flash Spackman Jr. 62 y.o. male Room/Bed: 038C/038C  Code Status   Code Status: Prior  Home/SNF/Other Home Patient oriented to: self, place, time, and situation Is this baseline? Yes      Chief Complaint TIA (transient ischemic attack) [G45.9]  Triage Note Pt BIB by [facility] from home. Pt was in the shower and dropped his wash cloth, when he tried to pick it up and grab it pt "felt like his brain could not tell his hand to grab the washcloth." Pt experienced left arm and leg weakness, as well as facial droop on the left side. Pt last known fell was 10:30am. Pt called his wife & EMS at 1050. Pt was feeling nauseous and had 4mg  of zofran. Pt has an 18g in RAC.   LVS  156/90  98% SATs  153 cbg  HR 52   Allergies Allergies  Allergen Reactions   Adhesive [Tape] Other (See Comments)    SKIN SCARRING/IRRITATION. PAPER TAPE IS OKAY    Level of Care/Admitting Diagnosis ED Disposition     ED Disposition  Admit   Condition  --   Comment  Hospital Area: [facility] [[id number]]  Level of Care: Telemetry Medical [104]  May place patient in observation at [facility] or [facility] if equivalent level of care is available:: No  Covid Evaluation: Asymptomatic - no recent exposure (last 10 days) testing not required  Diagnosis: TIA (transient ischemic attack) [id number]  Admitting Physician: [[id number]] Levin Erp  Attending Physician: [[id number]] [phone]          B Medical/Surgery History Past Medical History:  Diagnosis Date   Allergic rhinitis    Anxiety    Arthritis    "lower back, right thumb, knees" (10/04/2012)   Asthma    "grew out of it" (10/04/2012)   CKD (chronic kidney disease), stage II    Coronary artery disease    a. s/p prior stent to LAD;  b. LHC 6/14: DES to pRCA. c. DES to dCx 01/2017 with residual disease.   Depression     Hyperlipidemia    Hypertension    Ischemic cardiomyopathy    LV (left ventricular) mural thrombus    MI (myocardial infarction) (HCC) 03/2007   OSA (obstructive sleep apnea)    mild   Pneumonia    "as a child" (10/04/2012)   Sinus bradycardia    Past Surgical History:  Procedure Laterality Date   ARTHROPLASTY Right 1993   'crushed; removed bone fragments" (10/04/2012)   CARDIAC CATHETERIZATION  2010   CORONARY ANGIOPLASTY WITH STENT PLACEMENT  2008; 10/04/2012   "1 + 1" (10/04/2012)   CORONARY STENT INTERVENTION N/A 02/26/2017   Procedure: CORONARY STENT INTERVENTION;  Surgeon: 02/28/2017, MD;  [facility]: [facility] INVAS[facility];  Service: Cardiovascular;  Laterality: N/A;   EXPLORATORY LAPAROTOMY  1990's?   "went in to repair hernia; found fatty tissue instead; no hernia repair" (10/04/2012)   INTRAVASCULAR PRESSURE WIRE/FFR STUDY N/A 02/26/2017   Procedure: INTRAVASCULAR PRESSURE WIRE/FFR STUDY;  Surgeon: 02/28/2017, MD;  Location: [facility]VAS[facility] CV LAB;  Service: Cardiovascular;  Laterality: N/A;   LEFT HEART CATH AND CORONARY ANGIOGRAPHY N/A 02/26/2017   Procedure: LEFT HEART CATH AND CORONARY ANGIOGRAPHY;  Surgeon: 02/28/2017, MD;  Location: [facility];  Service: Cardiovascular;  Laterality: N/A;   LEFT HEART CATH AND CORONARY ANGIOGRAPHY N/A 03/29/2021  Procedure: LEFT HEART CATH AND CORONARY ANGIOGRAPHY;  Surgeon: Martinique, Peter M, MD;  Location: [facility] CV LAB;  Service: Cardiovascular;  Laterality: N/A;   LEFT HEART CATHETERIZATION WITH CORONARY ANGIOGRAM N/A 10/04/2012   Procedure: LEFT HEART CATHETERIZATION WITH CORONARY ANGIOGRAM;  Surgeon: Sherren Mocha, MD;  Location: [facility] CATH LAB;  Service: Cardiovascular;  Laterality: N/A;     A IV Location/Drains/Wounds Patient Lines/Drains/Airways Status     Active Line/Drains/Airways     Name Placement date Placement time Site Days   Peripheral IV 06/01/22 18 G Anterior;Distal;Right;Upper Arm  06/01/22  1207  Arm  less than 1   Peripheral IV 06/01/22 20 G Left Antecubital 06/01/22  1207  Antecubital  less than 1   Sheath 02/26/17 Right Arterial;Radial 02/26/17  0743  Arterial;Radial  1921            Intake/Output Last 24 hours No intake or output data in the 24 hours ending 06/01/22 1405  Labs/Imaging Results for orders placed or performed during the hospital encounter of 06/01/22 (from the past 48 hour(s))  CBG monitoring, ED     Status: Abnormal   Collection Time: 06/01/22 11:56 AM  Result Value Ref Range   Glucose-Capillary 138 (H) 70 - 99 mg/dL    Comment: Glucose reference range applies only to samples taken after fasting for at least 8 hours.  Ethanol     Status: None   Collection Time: 06/01/22 12:13 PM  Result Value Ref Range   Alcohol, Ethyl (B) <10 <10 mg/dL    Comment: (NOTE) Lowest detectable limit for serum alcohol is 10 mg/dL.  For medical purposes only. Performed at [facility], [place] [address] [place], [place] [address]   CBC     Status: Abnormal   Collection Time: 06/01/22 12:13 PM  Result Value Ref Range   WBC 10.1 4.0 - 10.5 K/uL   RBC 5.83 (H) 4.22 - 5.81 MIL/uL   Hemoglobin 13.6 13.0 - 17.0 g/dL   HCT 44.4 39.0 - 52.0 %   MCV 76.2 (L) 80.0 - 100.0 fL   MCH 23.3 (L) 26.0 - 34.0 pg   MCHC 30.6 30.0 - 36.0 g/dL   RDW 16.0 (H) 11.5 - 15.5 %   Platelets 231 150 - 400 K/uL   nRBC 0.0 0.0 - 0.2 %    Comment: Performed at [facility], [place] [address], [place], [place] [address]  Differential     Status: None   Collection Time: 06/01/22 12:13 PM  Result Value Ref Range   Neutrophils Relative % 68 %   Neutro Abs 6.8 1.7 - 7.7 K/uL   Lymphocytes Relative 23 %   Lymphs Abs 2.3 0.7 - 4.0 K/uL   Monocytes Relative 6 %   Monocytes Absolute 0.6 0.1 - 1.0 K/uL   Eosinophils Relative 3 %   Eosinophils Absolute 0.3 0.0 - 0.5 K/uL   Basophils Relative 0 %   Basophils Absolute 0.0 0.0 - 0.1 K/uL   Immature Granulocytes 0 %   Abs  Immature Granulocytes 0.03 0.00 - 0.07 K/uL    Comment: Performed at [facility] [address] [place], [place] [address]  Comprehensive metabolic panel     Status: Abnormal   Collection Time: 06/01/22 12:13 PM  Result Value Ref Range   Sodium 141 135 - 145 mmol/L   Potassium 3.5 3.5 - 5.1 mmol/L   Chloride 109 98 - 111 mmol/L   CO2 24 22 - 32 mmol/L  Glucose, Bld 121 (H) 70 - 99 mg/dL    Comment: Glucose reference range applies only to samples taken after fasting for at least 8 hours.   BUN 18 6 - 20 mg/dL   Creatinine, Ser 1.51 (H) 0.61 - 1.24 mg/dL   Calcium 8.8 (L) 8.9 - 10.3 mg/dL   Total Protein 6.5 6.5 - 8.1 g/dL   Albumin 3.7 3.5 - 5.0 g/dL   AST 22 15 - 41 U/L   ALT 13 0 - 44 U/L   Alkaline Phosphatase 56 38 - 126 U/L   Total Bilirubin 0.4 0.3 - 1.2 mg/dL   GFR, Estimated 53 (L) >60 mL/min    Comment: (NOTE) Calculated using the CKD-EPI Creatinine Equation (2021)    Anion gap 8 5 - 15    Comment: Performed at [facility] [address] [place], [place] [address]  Hemoglobin A1c     Status: Abnormal   Collection Time: 06/01/22 12:13 PM  Result Value Ref Range   Hgb A1c MFr Bld 5.9 (H) 4.8 - 5.6 %    Comment: (NOTE) Pre diabetes:          5.7%-6.4%  Diabetes:              >6.4%  Glycemic control for   <7.0% adults with diabetes    Mean Plasma Glucose 122.63 mg/dL    Comment: Performed at [facility] [address] [place], [place] [address]  I-stat chem 8, ED     Status: Abnormal   Collection Time: 06/01/22 12:50 PM  Result Value Ref Range   Sodium 143 135 - 145 mmol/L   Potassium 3.7 3.5 - 5.1 mmol/L   Chloride 106 98 - 111 mmol/L   BUN 20 6 - 20 mg/dL   Creatinine, Ser 1.40 (H) 0.61 - 1.24 mg/dL   Glucose, Bld 104 (H) 70 - 99 mg/dL    Comment: Glucose reference range applies only to samples taken after fasting for at least 8 hours.   Calcium, Ion 1.12 (L) 1.15 - 1.40 mmol/L   TCO2 28 22 - 32 mmol/L   Hemoglobin 15.0 13.0 - 17.0  g/dL   HCT 44.0 39.0 - 52.0 %  Protime-INR     Status: None   Collection Time: 06/01/22  1:00 PM  Result Value Ref Range   Prothrombin Time 13.3 11.4 - 15.2 seconds   INR 1.0 0.8 - 1.2    Comment: (NOTE) INR goal varies based on device and disease states. Performed at [facility], [place] [address], [place], [place] [address]   APTT     Status: None   Collection Time: 06/01/22  1:00 PM  Result Value Ref Range   aPTT 25 24 - 36 seconds    Comment: Performed at [facility] [address], [place], [place] [address]   CT HEAD CODE STROKE WO CONTRAST  Result Date: 06/01/2022 CLINICAL DATA:  Code stroke.  Neuro deficit, acute, stroke suspected EXAM: CT HEAD WITHOUT CONTRAST TECHNIQUE: Contiguous axial images were obtained from the base of the skull through the vertex without intravenous contrast. RADIATION DOSE REDUCTION: This exam was performed according to the departmental dose-optimization program which includes automated exposure control, adjustment of the mA and/or kV according to patient size and/or use of iterative reconstruction technique. COMPARISON:  None Available. FINDINGS: Brain: Possible hypoattenuation in the posterior aspect of the right insula. No evidence of acute hemorrhage, mass lesion, midline shift or hydrocephalus. Vascular:  No hyperdense vessel identified. Skull: No acute fracture. Sinuses/Orbits: Sinuses are largely clear. No acute orbital findings. Other: No mastoid effusions. ASPECTS [facility] Stroke Program Early CT Score) total score (0-10 with 10 being normal): 9. IMPRESSION: 1. Possible hypoattenuation in the posterior aspect of the right insula, which could represent acute or subacute infarct in the correct clinical setting. MRI could further evaluate if clinically warranted. 2. ASPECTS is 9 Code stroke imaging results were communicated on 06/01/2022 at 12:09 pm to provider Dr. Lorrin Goodell via telephone, who verbally acknowledged these results. Electronically  Signed   By: Margaretha Sheffield M.D.   On: 06/01/2022 12:09    Pending Labs Unresulted Labs (From admission, onward)     Start     Ordered   06/02/22 0500  Lipid panel  (Labs)  Tomorrow morning,   R       Comments: Fasting    06/01/22 1211   06/01/22 1155  Urine rapid drug screen (hosp performed)  Once,   STAT        06/01/22 1155   06/01/22 1155  Urinalysis, Routine w reflex microscopic -Urine, Clean Catch  Once,   URGENT       Question:  Specimen Source  Answer:  Urine, Clean Catch   06/01/22 1155            Vitals/Pain Today's Vitals   06/01/22 1200 06/01/22 1211 06/01/22 1315  BP: (!) 150/86 124/77 (!) 131/93  Pulse:   (!) 49  Resp:  12 13  Temp:  97.8 F (36.6 C)   TempSrc:  Oral   SpO2:  99% 98%  Weight: 90 kg      Isolation Precautions No active isolations  Medications Medications   stroke: early stages of recovery book (has no administration in time range)  iohexol (OMNIPAQUE) 350 MG/ML injection 75 mL (75 mLs Intravenous Contrast Given 06/01/22 1334)    Mobility Have not ambulated pt      Focused Assessments    R Recommendations: See Admitting Provider Note  Report given to:   Additional Notes: NIH q 2 hours next one due at 1600

## 2022-06-01 NOTE — Code Documentation (Signed)
Stroke Response Nurse Documentation Code Documentation  Austin Ingram. is a 62 y.o. male arriving to [facility]  via  [facility]  on 06/01/2022 with past medical hx significant of obstructive sleep apnea, hyperlipidemia, hypertension, MI, CAD, depression, Anxiety, ischemic cardiomyopathy, CKD stage 2, and LV mural thrombus.. On Eliquis (apixaban) daily. Code stroke was activated by EMS.   Patient from home where he was LKW at 1030 when he was taking a shower, dropped his washcloth and fell. Noted left sided weakness and slurred speech. EMS reports symptoms resolving.   Stroke team at the bedside on patient arrival. Labs drawn and patient cleared for CT by EDP. Patient to CT with team. NIHSS 0, see documentation for details and code stroke times. The following imaging was completed:  CT Head. Patient is not a candidate for IV Thrombolytic due to anticoagulants and symptoms resolved. Patient is not a candidate for IR due to symptoms resolved.   Care Plan: Q2 VS and NIH. Activate code stroke if symptoms return.    Bedside handoff with ED RN Herbert Spires.    Leverne Humbles Stroke Response RN

## 2022-06-02 DIAGNOSIS — G459 Transient cerebral ischemic attack, unspecified: Secondary | ICD-10-CM | POA: Diagnosis not present

## 2022-06-02 DIAGNOSIS — I639 Cerebral infarction, unspecified: Secondary | ICD-10-CM | POA: Diagnosis not present

## 2022-06-02 LAB — CBC
HCT: 48.4 % (ref 39.0–52.0)
Hemoglobin: 14.7 g/dL (ref 13.0–17.0)
MCH: 23.3 pg — ABNORMAL LOW (ref 26.0–34.0)
MCHC: 30.4 g/dL (ref 30.0–36.0)
MCV: 76.7 fL — ABNORMAL LOW (ref 80.0–100.0)
Platelets: 216 10*3/uL (ref 150–400)
RBC: 6.31 MIL/uL — ABNORMAL HIGH (ref 4.22–5.81)
RDW: 17.2 % — ABNORMAL HIGH (ref 11.5–15.5)
WBC: 6.7 10*3/uL (ref 4.0–10.5)
nRBC: 0 % (ref 0.0–0.2)

## 2022-06-02 LAB — BASIC METABOLIC PANEL
Anion gap: 9 (ref 5–15)
BUN: 15 mg/dL (ref 6–20)
CO2: 23 mmol/L (ref 22–32)
Calcium: 9.1 mg/dL (ref 8.9–10.3)
Chloride: 107 mmol/L (ref 98–111)
Creatinine, Ser: 1.25 mg/dL — ABNORMAL HIGH (ref 0.61–1.24)
GFR, Estimated: >60 mL/min (ref >60)
Glucose, Bld: 91 mg/dL (ref 70–99)
Potassium: 4.3 mmol/L (ref 3.5–5.1)
Sodium: 139 mmol/L (ref 135–145)

## 2022-06-02 LAB — LIPID PANEL
Cholesterol: 197 mg/dL (ref 0–200)
HDL: 45 mg/dL (ref >40)
LDL Cholesterol: 137 mg/dL — ABNORMAL HIGH (ref 0–99)
Total CHOL/HDL Ratio: 4.4 RATIO
Triglycerides: 73 mg/dL (ref <150)
VLDL: 15 mg/dL (ref 0–40)

## 2022-06-02 LAB — HIV ANTIBODY (ROUTINE TESTING W REFLEX): HIV Screen 4th Generation wRfx: NONREACTIVE

## 2022-06-02 MED ORDER — WARFARIN SODIUM 7.5 MG PO TABS
7.5000 mg | ORAL_TABLET | Freq: Once | ORAL | Status: AC
  Administered 2022-06-02: 7.5 mg via ORAL
  Filled 2022-06-02: qty 1

## 2022-06-02 MED ORDER — WARFARIN - PHARMACIST DOSING INPATIENT: Freq: Every day | Status: DC

## 2022-06-02 MED ORDER — ATORVASTATIN CALCIUM 80 MG PO TABS: 80.0000 mg | ORAL_TABLET | Freq: Every day | ORAL | 0 refills | Status: DC

## 2022-06-02 MED ORDER — ATORVASTATIN CALCIUM 80 MG PO TABS: 80.0000 mg | ORAL_TABLET | Freq: Every day | ORAL | Status: DC

## 2022-06-02 MED ORDER — WARFARIN SODIUM 5 MG PO TABS: ORAL_TABLET | ORAL | 0 refills | Status: DC

## 2022-06-02 MED ORDER — HYDRALAZINE HCL 50 MG PO TABS: 50.0000 mg | ORAL_TABLET | Freq: Three times a day (TID) | ORAL | Status: DC

## 2022-06-02 MED ORDER — ENOXAPARIN SODIUM 100 MG/ML IJ SOSY
90.0000 mg | PREFILLED_SYRINGE | Freq: Two times a day (BID) | INTRAMUSCULAR | Status: DC
  Administered 2022-06-02: 90 mg via SUBCUTANEOUS
  Filled 2022-06-02 (×2): qty 0.9

## 2022-06-02 MED ORDER — ISOSORBIDE MONONITRATE ER 30 MG PO TB24: 30.0000 mg | ORAL_TABLET | Freq: Every day | ORAL | Status: DC

## 2022-06-02 MED ORDER — ENOXAPARIN SODIUM 100 MG/ML IJ SOSY: 90.0000 mg | PREFILLED_SYRINGE | Freq: Two times a day (BID) | INTRAMUSCULAR | 0 refills | Status: DC

## 2022-06-02 MED ORDER — AMLODIPINE BESYLATE 10 MG PO TABS: 10.0000 mg | ORAL_TABLET | Freq: Every day | ORAL | Status: DC

## 2022-06-02 NOTE — Consult Note (Signed)
ELECTROPHYSIOLOGY CONSULT NOTE    Patient ID: Austin Pew Almendariz Jr. MRN: [id number], DOB/AGE: 10-07-1960 62 y.o.  Admit date: 06/01/2022 Date of Consult: 06/02/2022  Primary Physician: Alroy Dust, L.Marlou Sa, MD Primary Cardiologist: Lauree Chandler, MD  Electrophysiologist: New   Referring Provider: Dr. Leonie Man  Patient Profile: Austin Pew Gianfrancesco [name]. is a 62 y.o. male with a history of Chronic systolic CHF, LV thrombus managed on Eliquis, CAD s/p multiple PCIs and stenting in 2004, 2008, and 2018 who is being seen today for recommendations regarding cardiac monitoring and possible defibrillator in pt with [place] and medical history above at the request of Dr. Leonie Man.  HPI:  Kerney Grieder [name]. is a 62 y.o. male with medical history as above.   Followed by CHF team. Echo 03/06/19 with LVEF=35-40% with apical akinesis. F/U with [facility] showed LV thrombus. He was started on Eliquis and Entresto. Repeat echo June 2021 with LVEF 35-40% and apical thrombus. Nuclear stress test April 2022 with no ischemia.  Echo November 2022 with LVEF=30% and no LV thrombus.  Cardiac cath November 2022 with CTO of the mid RCA and patent stents in the proximal LAD, distal Circumflex and proximal RCA.  Most recently seen in HF clinic 07/2021. Had plans to follow back up with repeat Echo. Had previously discussed ICD and pt refused.   Pt presented 06/01/2022 with left sided weakness and facial droop. Brought in via EMS and found to have CVA.  Stroke work up includes Code Stroke CT head: Possible hypoattenuation in the posterior aspect of the right insula, which could represent acute or subacute infarct in the correct clinical setting. MRI could further evaluate if clinically warranted CTA head & neck: Occlusion of a right M3 branch in the insula. No other intracranial vascular occlusion or significant stenosis. MRI: Small acute or subacute infarct in the posterior limb of the right internal capsule and adjacent subinsular white  matter. This correlates with the abnormality seen on same day CT head. 2D Echo - LVEF 00-86%, Grade I diastolic dysfunction; Akinesis of left ventricular, mid-apical anteroseptal wall, anterolateral wall, apical segment, inferolateral wall, inferoseptal wall and inferior wall. Trivial MVR.  LDL 137 HgbA1c 5.9  EP asked to see for recommendations regarding cardiac monitoring or ICD given potential failure of Eliquis and longstanding LV dysfunction.   Pt is currently in bed. Wife at bedside. Pt anxious to go home. Pt denies syncope or medication non-compliance.  He still does not wish to consider ICD. He verbalizes understanding of suspected Eliquis failure and concerning for ongoing LV thrombus risk.   Labs Potassium4.3 (02/02 7619) Magnesium  2.1 (02/01 1300) Creatinine, ser  1.25* (02/02 0622) PLT  216 (02/02 0622) HGB  14.7 (02/02 0622) WBC 6.7 (02/02 0622)   Medications Prior to Admission  Medication Sig Dispense Refill Last Dose   amLODipine (NORVASC) 10 MG tablet Take 10 mg by mouth daily.   06/01/2022   aspirin EC 81 MG EC tablet Take 1 tablet (81 mg total) by mouth daily. Swallow whole. (Patient taking differently: Take 81 mg by mouth daily.) 30 tablet 11 06/01/2022   atorvastatin (LIPITOR) 40 MG tablet Take 1 tablet (40 mg total) by mouth daily. 90 tablet 1 06/01/2022   citalopram (CELEXA) 40 MG tablet Take 40 mg by mouth daily.   06/01/2022   diclofenac Sodium (VOLTAREN) 1 % GEL Apply 1 Application topically 2 (two) times daily as needed (pain).   05/31/2022   ELIQUIS 5 MG TABS tablet TAKE 1 TABLET BY MOUTH TWICE A DAY (  Patient taking differently: Take 5 mg by mouth 2 (two) times daily.) 60 tablet 5 06/01/2022 at 0830   ENTRESTO 97-103 MG TAKE 1 TABLET BY MOUTH TWICE A DAY (Patient taking differently: Take 1 tablet by mouth 2 (two) times daily.) 180 tablet 3 06/01/2022   etodolac (LODINE) 400 MG tablet Take 400 mg by mouth 2 (two) times daily as needed for mild pain or moderate pain.    06/01/2022   FARXIGA 10 MG TABS tablet TAKE 1 TABLET BY MOUTH DAILY BEFORE BREAKFAST. 30 tablet 4 06/01/2022   finasteride (PROSCAR) 5 MG tablet Take 5 mg by mouth daily.   06/01/2022   fluticasone (FLONASE) 50 MCG/ACT nasal spray Place 1-2 sprays into both nostrils daily as needed for allergies.   Past Month   hydrALAZINE (APRESOLINE) 50 MG tablet TAKE 1 TABLET BY MOUTH THREE TIMES A DAY 270 tablet 3 06/01/2022   isosorbide mononitrate (IMDUR) 30 MG 24 hr tablet Take 30 mg by mouth daily.      nitroGLYCERIN (NITROSTAT) 0.4 MG SL tablet PLACE 1 TABLET UNDER THE TONGUE EVERY 5 MINUTES AS NEEDED FOR CHEST PAIN. PLEASE SCHEDULE YEARLY APPOINTMENT FOR FUTURE REFILLS. THANK YOU (Patient taking differently: Place 0.4 mg under the tongue every 5 (five) minutes as needed for chest pain.) 75 tablet 0 UNK   spironolactone (ALDACTONE) 25 MG tablet Take 1 tablet (25 mg total) by mouth daily. 90 tablet 2 06/01/2022   tamsulosin (FLOMAX) 0.4 MG CAPS capsule Take 0.4 mg by mouth daily with breakfast.    06/01/2022   isosorbide mononitrate (IMDUR) 60 MG 24 hr tablet Take 1 tablet (60 mg total) by mouth daily. (Patient not taking: Reported on 06/01/2022) 30 tablet 1 Not Taking    Inpatient Medications:   aspirin EC  81 mg Oral Daily   [START ON 06/03/2022] atorvastatin  80 mg Oral Daily   citalopram  40 mg Oral Daily   finasteride  5 mg Oral Daily   tamsulosin  0.4 mg Oral Q breakfast    Allergies:  Allergies  Allergen Reactions   Adhesive [Tape] Other (See Comments)    SKIN SCARRING/IRRITATION. PAPER TAPE IS OKAY    Family History  Problem Relation Age of Onset   Atrial fibrillation Mother        irregular heart beats   Hypertension Mother    Diabetes type II Mother    CAD Maternal Grandfather    CAD Maternal Grandmother      Physical Exam: Vitals:   06/02/22 0339 06/02/22 0343 06/02/22 0851 06/02/22 1128  BP: (!) 141/90   (!) 179/92  Pulse: (!) 105   (!) 54  Resp: 14 20  18   Temp: 97.7 F (36.5 C)    98.7 F (37.1 C)  TempSrc: Oral   Oral  SpO2: 100%   97%  Weight:      Height:   5\' 9"  (1.753 m)     GEN- NAD, A&O x 3, normal affect HEENT: Normocephalic, atraumatic Lungs- CTAB, Normal effort.  Heart- Regular rate and rhythm, No M/G/R.  GI- Soft, NT, ND.  Extremities- No clubbing, cyanosis, or edema   EKG:on arrival shows sinus brady at 52 bpm (personally reviewed)  TELEMETRY: Sinus brady/ NSR 50-70s (personally reviewed)  Assessment/Plan: 1.  CVA 2. H/o LV thrombus In the setting of persistent LV dysfunction (<35%) and on-going apical akinesis, we would recommend on-going anticoagulation.  If he is felt to have failed Eliquis, he would need to be switched to coumadin.  No indication for loop given [place] use.  Management per Primary team and Neurology.   3. Chronic systolic CHF Pt has previously been offered ICD consideration and refused.  He is still not interested in ICD consideration, nor is he acutely a candidate for one given CVA and h/o LV thrombus.  Reasonable to consider cMRI in a patient with apical akinesis, persistent LV dysfunction, and CVA if need to fully r/o LV thrombus presence.  We will set up EP follow up as an outpatient.  Will need HF clinic follow up for on-going LV dysfunction.   EP to see as needed while remains here. Please call back with questions.   For questions or updates, please contact [place] Please consult [email or web address] for contact info under Cardiology/STEMI.  Jacalyn Lefevre, [name]  06/02/2022 2:16 PM

## 2022-06-02 NOTE — Discharge Instructions (Addendum)
Dear Austin Ingram.,  Thank you for letting us participate in your care. You were hospitalized and diagnosed with Stroke [facility]). You will want to follow up with your neurologist outpatient.  POST-HOSPITAL & CARE INSTRUCTIONS Take Warfarin and Lovenox as prescribed Warfarin 7.5 mg on 06/03/22 Warfarin 5 mg there after Make appointment with [facility] Follow up with cardiology for your Warfarin clinic Go to your follow up appointments (listed below)   DOCTOR'S APPOINTMENT   Future Appointments  Date Time Provider [place]  06/07/2022  2:30 PM [facility] None  08/02/2022  3:00 PM Vickie Epley, MD [facility]  08/29/2022  9:15 AM Dunn, Nedra Hai, [name] [facility]     Take care and be well!  [facility]  [place], [place] [address] [phone]

## 2022-06-02 NOTE — Plan of Care (Signed)

## 2022-06-02 NOTE — Discharge Summary (Addendum)
[facility] Discharge Summary  Patient name: Austin Ingram. Medical record number: [id number] Date of birth: [birth year] Age: 62 y.o. Gender: male Date of Admission: 06/01/2022  Date of Discharge: 06/02/22 Admitting Physician: Gerrit Heck, MD  Primary Care Provider: Alroy Dust, Carlean Jews.Marlou Sa, MD Consultants: Neurology, Cardiology  Indication for Hospitalization: Stroke  Discharge Diagnoses/Problem List:  Principal Problem for Admission:  Other Problems addressed during stay:  Principal Problem:   Stroke [facility]) Active Problems:   Chronic combined systolic and diastolic heart failure (HCC)   Cardiac akinesia   CAD (coronary artery disease)   Sinus bradycardia   HTN (hypertension)   CKD (chronic kidney disease), stage II   HLD (hyperlipidemia)   TIA (transient ischemic attack)    Brief Hospital Course:  Austin Ingram. is a 62 y.o. male PMH of LV mural thrombus on eliquis, HTN, HLD, CKD, HF (last echo 2022 with EF 30-35%, no thrombus but had apical akinesis), CAD s/p PCI 2004 RCA and 2008 LAD, OSA presenting with left-sided facial weakness, arm weakness, leg weakness.   Stroke, likely cardioembolic Cardiac akinesia  Pt presented w/ ~21min of  sided facial, arm, and leg weakness that was fully resolved by admission. Neurology was consulted. CT head and MRI brain showed acute vs subacute infarct of posterior limb of internal capsule. CT angio head and neck showed Occlusion of a right M3 branch in the insula. Echo showed EF 30 - 35% with apical akinesis. Given embolic stroke while compliant with eliquis, was deemed Eliquis failure. Per neurology and cardiology recs, was transitioned from home apixaban to warfarin, with 3-day lovenox bridge. Followed by cardiology and heart failure outpatient.  Combined Systolic and Diastolic Heart Failure (EF 30-35%)  Echo showed EF 30-35% and apical akinesis, similar to prior. Initially held spironolactone, Entresto, and Farxiga  due to Cr rise. Hydralazine and Imdur continued during hospitalization. All medications resumed on discharge.  All other conditions chronic and stable  CAD Present on admission. Continued aspirin.  HLD LDL 137 during admission, home atorvastatin dose increased to 80 mg, with LDL goal < 70.  CKD Cr 1.4 on admission, downtrended to 1.25 on discharge. Baseline Cr 2023 was 1.21.  Other chronic problems Sinus bradycardia HTN OSA  PCP Follow-up: Be sure patient has f/u w/ Neurology, patient d/c before was scheduled Recheck Cr, was elevated during admission  Strict BP and lipids control s/p stroke Recheck INR  Disposition: Home  Discharge Condition: Improved   Discharge Exam:  Vitals:   06/02/22 1527 06/02/22 1530  BP: (!) 149/99 (!) 149/99  Pulse: 64 64  Resp: 18 18  Temp:  98.2 F (36.8 C)  SpO2: 99% 99%   Physical Exam: General: well-appearing and in no acute distress HEENT: normocephalic and atraumatic Respiratory: normal respiratory effort and on RA Extremities: moving all extremities spontaneously Gastrointestinal: non-tender and non-distended Cardiovascular: regular rate   Significant Procedures: None  Significant Labs and Imaging:   MRI: small acute or subacute infarct posterior limb R internal capsule   CTA head/neck: occlusion of R M3 branch of insula    Recent Labs  Lab 06/01/22 1213 06/01/22 1250 06/02/22 0622  WBC 10.1  --  6.7  HGB 13.6 15.0 14.7  HCT 44.4 44.0 48.4  PLT 231  --  216   Recent Labs  Lab 06/01/22 1213 06/01/22 1250 06/01/22 1300 06/02/22 0622  NA 141 143  --  139  K 3.5 3.7  --  4.3  CL 109 106  --  107  CO2 24  --   --  23  GLUCOSE 121* 104*  --  91  BUN 18 20  --  15  CREATININE 1.51* 1.40*  --  1.25*  CALCIUM 8.8*  --   --  9.1  MG  --   --  2.1  --   ALKPHOS 56  --   --   --   AST 22  --   --   --   ALT 13  --   --   --   ALBUMIN 3.7  --   --   --        Results/Tests Pending at Time of Discharge:  None  Discharge Medications:  Allergies as of 06/02/2022       Reactions   Adhesive [tape] Other (See Comments)   SKIN SCARRING/IRRITATION. PAPER TAPE IS OKAY        Medication List     STOP taking these medications    Eliquis 5 MG Tabs tablet Generic drug: apixaban       TAKE these medications    amLODipine 10 MG tablet Commonly known as: NORVASC Take 10 mg by mouth daily.   aspirin EC 81 MG tablet Take 1 tablet (81 mg total) by mouth daily. Swallow whole. What changed: additional instructions   atorvastatin 80 MG tablet Commonly known as: LIPITOR Take 1 tablet (80 mg total) by mouth daily. Start taking on: June 03, 2022 What changed:  medication strength how much to take   citalopram 40 MG tablet Commonly known as: CELEXA Take 40 mg by mouth daily.   enoxaparin 100 MG/ML injection Commonly known as: LOVENOX Inject 0.9 mLs (90 mg total) into the skin every 12 (twelve) hours for 3 days. Start taking on: June 03, 2022   Entresto 97-103 MG Generic drug: sacubitril-valsartan TAKE 1 TABLET BY MOUTH TWICE A DAY   etodolac 400 MG tablet Commonly known as: LODINE Take 400 mg by mouth 2 (two) times daily as needed for mild pain or moderate pain.   Farxiga 10 MG Tabs tablet Generic drug: dapagliflozin propanediol TAKE 1 TABLET BY MOUTH DAILY BEFORE BREAKFAST.   finasteride 5 MG tablet Commonly known as: PROSCAR Take 5 mg by mouth daily.   fluticasone 50 MCG/ACT nasal spray Commonly known as: FLONASE Place 1-2 sprays into both nostrils daily as needed for allergies.   hydrALAZINE 50 MG tablet Commonly known as: APRESOLINE TAKE 1 TABLET BY MOUTH THREE TIMES A DAY   isosorbide mononitrate 30 MG 24 hr tablet Commonly known as: IMDUR Take 30 mg by mouth daily.   isosorbide mononitrate 60 MG 24 hr tablet Commonly known as: IMDUR Take 1 tablet (60 mg total) by mouth daily.   nitroGLYCERIN 0.4 MG SL tablet Commonly known as: NITROSTAT PLACE 1  TABLET UNDER THE TONGUE EVERY 5 MINUTES AS NEEDED FOR CHEST PAIN. PLEASE SCHEDULE YEARLY APPOINTMENT FOR FUTURE REFILLS. THANK YOU What changed: See the new instructions.   spironolactone 25 MG tablet Commonly known as: ALDACTONE Take 1 tablet (25 mg total) by mouth daily.   tamsulosin 0.4 MG Caps capsule Commonly known as: FLOMAX Take 0.4 mg by mouth daily with breakfast.   Voltaren 1 % Gel Generic drug: diclofenac Sodium Apply 1 Application topically 2 (two) times daily as needed (pain).   warfarin 5 MG tablet Commonly known as: Coumadin Take 1.5 tablets (7.5 mg total) by mouth daily for 1 day, THEN 1 tablet (5 mg total) daily for 4 days. Start  taking on: June 03, 2022        Discharge Instructions: Please refer to Patient Instructions section of EMR for full details.  Patient was counseled important signs and symptoms that should prompt return to medical care, changes in medications, dietary instructions, activity restrictions, and follow up appointments.   Follow-Up Appointments:  Follow-up Information     Alroy Dust, L.Marlou Sa, MD. Schedule an appointment as soon as possible for a visit in 1 week(s).   Specialty: Family Medicine Contact information: [address] [place] [address] [phone]         [facility]. Go on 06/07/2022.   Specialty: Cardiology Why: 2:30pm Contact information: [address] [place] [id number] [place]                Holley Bouche, MD 06/02/2022, 8:50 PM PGY-2, [place]

## 2022-06-02 NOTE — Progress Notes (Signed)
OT Cancellation Note  Patient Details Name: Lenell Crossan [name]. MRN: [id number] DOB: [birth year]   Cancelled Treatment:    Reason Eval/Treat Not Completed: OT screened, no needs identified, will sign off. PT saw pt and reports pt is functioning at baseline for ADL and IADL. No further acute needs identified.   Elder Cyphers, OTR/L [facility] Acute Rehabilitation Office: [phone]   Magnus Ivan 06/02/2022, 9:40 AM

## 2022-06-02 NOTE — Progress Notes (Addendum)
STROKE TEAM PROGRESS NOTE   INTERVAL HISTORY His wife is at the bedside.  No acute events overnight.   Slight left facial droop, decreased L hand grip and fine motor.   Discussed [place] with patient. Austin Ingram has been on Eliquis since 2020 per cardiology notes. Patient states Austin Ingram has not missed any doses. ?Eliquis failure.  Discussed case with EPS. Recommend changing OAC to Coumadin given on-going apical akinesis and EF <35%. and outpatient consideration for ICD   Vitals:   06/02/22 0339 06/02/22 0343 06/02/22 0851 06/02/22 1128  BP: (!) 141/90   (!) 179/92  Pulse: (!) 105   (!) 54  Resp: 14 20  18   Temp: 97.7 F (36.5 C)   98.7 F (37.1 C)  TempSrc: Oral   Oral  SpO2: 100%   97%  Weight:      Height:   5\' 9"  (1.753 m)    CBC:  Recent Labs  Lab 06/01/22 1213 06/01/22 1250 06/02/22 0622  WBC 10.1  --  6.7  NEUTROABS 6.8  --   --   HGB 13.6 15.0 14.7  HCT 44.4 44.0 48.4  MCV 76.2*  --  76.7*  PLT 231  --  235   Basic Metabolic Panel:  Recent Labs  Lab 06/01/22 1213 06/01/22 1250 06/01/22 1300 06/02/22 0622  NA 141 143  --  139  K 3.5 3.7  --  4.3  CL 109 106  --  107  CO2 24  --   --  23  GLUCOSE 121* 104*  --  91  BUN 18 20  --  15  CREATININE 1.51* 1.40*  --  1.25*  CALCIUM 8.8*  --   --  9.1  MG  --   --  2.1  --    Lipid Panel:  Recent Labs  Lab 06/02/22 0622  CHOL 197  TRIG 73  HDL 45  CHOLHDL 4.4  VLDL 15  LDLCALC 137*   HgbA1c:  Recent Labs  Lab 06/01/22 1213  HGBA1C 5.9*   Urine Drug Screen:  Recent Labs  Lab 06/01/22 1725  LABOPIA NONE DETECTED  COCAINSCRNUR NONE DETECTED  LABBENZ NONE DETECTED  AMPHETMU NONE DETECTED  THCU POSITIVE*  LABBARB NONE DETECTED    Alcohol Level  Recent Labs  Lab 06/01/22 1213  [place] <10    IMAGING past 24 hours ECHOCARDIOGRAM COMPLETE BUBBLE STUDY  Result Date: 06/01/2022    ECHOCARDIOGRAM REPORT   Patient Name:   Austin Pew Poma Jr. Date of Exam: 06/01/2022 Medical Rec #:  [id number]       Height:       69.0  in Accession #:    [id number]      Weight:       198.4 lb Date of Birth:  [birth year]       BSA:          2.059 m Patient Age:    62 years        BP:           158/114 mmHg Patient Gender: M               HR:           49 bpm. Exam Location:  Inpatient Procedure: 2D Echo, Cardiac Doppler, Color Doppler, Intracardiac Opacification            Agent and Saline Contrast Bubble Study Indications:    Stroke 434.91 / I63.9  History:        Patient  has prior history of Echocardiogram examinations, most                 recent 03/29/2021. CHF and Cardiomyopathy, CAD and Previous                 Myocardial Infarction, Stroke, Arrythmias:Bradycardia; Risk                 Factors:Hypertension, Dyslipidemia, Former Smoker and Sleep                 Apnea.  Sonographer:    Greer Pickerel Referring Phys: [id number] DEVON SHAFER  Sonographer Comments: Image acquisition challenging due to respiratory motion. IMPRESSIONS  1. Left ventricular ejection fraction, by estimation, is 30 to 35%. The left ventricle has moderately decreased function. Left ventricular endocardial border not optimally defined to evaluate regional wall motion. The left ventricular internal cavity size was mildly dilated. Left ventricular diastolic parameters are consistent with Grade I diastolic dysfunction (impaired relaxation). There is akinesis of the left ventricular, mid-apical anteroseptal wall, anterolateral wall, apical segment, inferolateral wall, inferoseptal wall and inferior wall.  2. Right ventricular systolic function is normal. The right ventricular size is normal.  3. Left atrial size was moderately dilated.  4. The mitral valve is normal in structure. Trivial mitral valve regurgitation.  5. The aortic valve is tricuspid. Aortic valve regurgitation is not visualized. FINDINGS  Left Ventricle: Left ventricular ejection fraction, by estimation, is 30 to 35%. The left ventricle has moderately decreased function. Left ventricular endocardial border not  optimally defined to evaluate regional wall motion. Definity contrast agent was given IV to delineate the left ventricular endocardial borders. The left ventricular internal cavity size was mildly dilated. There is no left ventricular hypertrophy. Left ventricular diastolic parameters are consistent with Grade I diastolic dysfunction (impaired relaxation). Right Ventricle: The right ventricular size is normal. No increase in right ventricular wall thickness. Right ventricular systolic function is normal. Left Atrium: Left atrial size was moderately dilated. Right Atrium: Right atrial size was normal in size. Pericardium: There is no evidence of pericardial effusion. Mitral Valve: The mitral valve is normal in structure. Trivial mitral valve regurgitation. Tricuspid Valve: The tricuspid valve is normal in structure. Tricuspid valve regurgitation is trivial. Aortic Valve: The aortic valve is tricuspid. Aortic valve regurgitation is not visualized. Pulmonic Valve: The pulmonic valve was normal in structure. Pulmonic valve regurgitation is trivial. Aorta: The aortic root was not well visualized. IAS/Shunts: The interatrial septum was not well visualized. Agitated saline contrast was given intravenously to evaluate for intracardiac shunting.  LEFT VENTRICLE PLAX 2D LVIDd:         5.50 cm   Diastology LVIDs:         3.70 cm   LV e' medial:    5.44 cm/s LV PW:         1.50 cm   LV E/e' medial:  10.8 LV IVS:        1.20 cm   LV e' lateral:   4.24 cm/s LVOT diam:     2.20 cm   LV E/e' lateral: 13.8 LV SV:         81 LV SV Index:   39 LVOT Area:     3.80 cm  RIGHT VENTRICLE RV S prime:     18.20 cm/s TAPSE (M-mode): 2.2 cm LEFT ATRIUM             Index        RIGHT ATRIUM  Index LA diam:        3.70 cm 1.80 cm/m   RA Area:     13.00 cm LA Vol (A2C):   64.1 ml 31.13 ml/m  RA Volume:   27.40 ml  13.31 ml/m LA Vol (A4C):   65.9 ml 32.01 ml/m LA Biplane Vol: 66.5 ml 32.30 ml/m  AORTIC VALVE             PULMONIC  VALVE LVOT Vmax:   81.30 cm/s  PR End Diast Vel: 2.78 msec LVOT Vmean:  59.000 cm/s LVOT VTI:    0.213 m  AORTA Ao Root diam: 3.70 cm Ao Asc diam:  3.50 cm MITRAL VALVE MV Area (PHT): 2.12 cm    SHUNTS MV Decel Time: 357 msec    Systemic VTI:  0.21 m MV E velocity: 58.70 cm/s  Systemic Diam: 2.20 cm MV A velocity: 72.30 cm/s MV E/A ratio:  0.81 Glori Bickers MD Electronically signed by Glori Bickers MD Signature Date/Time: 06/01/2022/6:45:03 PM    Final    MR BRAIN WO CONTRAST  Result Date: 06/01/2022 CLINICAL DATA:  Stroke, follow up EXAM: MRI HEAD WITHOUT CONTRAST TECHNIQUE: Multiplanar, multiecho pulse sequences of the brain and surrounding structures were obtained without intravenous contrast. COMPARISON:  Same day CT head. FINDINGS: Brain: Small acute or subacute infarct in the posterior limb of the right internal capsule and adjacent subinsular white matter. Slight associated edema. No significant mass effect. No evidence of acute hemorrhage, mass lesion, midline shift or hydrocephalus. Vascular: Better evaluated on same day CTA. Skull and upper cervical spine: Normal marrow signal. Sinuses/Orbits: Clear sinuses.  No acute orbital findings. Other: No mastoid effusions. IMPRESSION: Small acute or subacute infarct in the posterior limb of the right internal capsule and adjacent subinsular white matter. This correlates with the abnormality seen on same day CT head. Electronically Signed   By: Margaretha Sheffield M.D.   On: 06/01/2022 15:02   CT ANGIO HEAD NECK W WO CM  Result Date: 06/01/2022 CLINICAL DATA:  Stroke suspected EXAM: CT ANGIOGRAPHY HEAD AND NECK TECHNIQUE: Multidetector CT imaging of the head and neck was performed using the standard protocol during bolus administration of intravenous contrast. Multiplanar CT image reconstructions and MIPs were obtained to evaluate the vascular anatomy. Carotid stenosis measurements (when applicable) are obtained utilizing NASCET criteria, using the distal  internal carotid diameter as the denominator. RADIATION DOSE REDUCTION: This exam was performed according to the departmental dose-optimization program which includes automated exposure control, adjustment of the mA and/or kV according to patient size and/or use of iterative reconstruction technique. CONTRAST:  30mL OMNIPAQUE IOHEXOL 350 MG/ML SOLN COMPARISON:  None Available. FINDINGS: CT HEAD FINDINGS For noncontrast findings, please see same day CT head. CTA NECK FINDINGS Aortic arch: 4 vessel arch, with average orange of the left subclavian, which passes posterior to the esophagus. Imaged portion shows no evidence of aneurysm or dissection. No significant stenosis of the major arch vessel origins. Right carotid system: No evidence of dissection, occlusion, or hemodynamically significant stenosis (greater than 50%). Atherosclerotic disease at the bifurcation and in the proximal ICA is not hemodynamically significant. Left carotid system: No evidence of dissection, occlusion, or hemodynamically significant stenosis (greater than 50%). Atherosclerotic disease at the bifurcation and in the proximal ICA is not hemodynamically significant. Vertebral arteries: No evidence of dissection, occlusion, or hemodynamically significant stenosis (greater than 50%). Skeleton: No acute osseous abnormality. Other neck: No acute finding. Upper chest: No focal pulmonary opacity or pleural effusion. Review of  the MIP images confirms the above findings CTA HEAD FINDINGS Anterior circulation: Both internal carotid arteries are patent to the termini, with calcifications but without significant stenosis. A1 segments patent. Normal anterior communicating artery. Anterior cerebral arteries are patent to their distal aspects. No M1 stenosis or occlusion. Occlusion of a right M3 branch in the insula (series 7, images 102-106). MCA branches otherwise perfused to their distal aspects. Posterior circulation: Vertebral arteries patent to the  vertebrobasilar junction without stenosis. Posterior inferior cerebellar arteries patent proximally. Basilar patent to its distal aspect. Superior cerebellar arteries patent proximally. Patent P1 segments. PCAs perfused to their distal aspects without stenosis. The bilateral posterior communicating arteries are patent. Venous sinuses: As permitted by contrast timing, patent. Anatomic variants: None significant. Review of the MIP images confirms the above findings IMPRESSION: 1. Occlusion of a right M3 branch in the insula. No other intracranial vascular occlusion or significant stenosis. 2. No hemodynamically significant stenosis in the neck. Imaging results were communicated on 06/01/2022 at 1:42 pm to provider Dr. Derry Lory via secure text paging. Electronically Signed   By: Wiliam Ke M.D.   On: 06/01/2022 13:54    PHYSICAL EXAM  GENERAL: Awake, alert in NAD HEENT: - Normocephalic and atraumatic, dry mm LUNGS - unlabored breathing, no supplemental oxygen needed CV - S1S2 RRR, equal pulses bilaterally. ABDOMEN - Soft, nontender, nondistended  Ext: warm, well perfused, intact peripheral pulses, no edema   NEURO:  Mental Status: AA&Ox3  Language: speech is clear.  Naming, repetition, fluency, and comprehension intact. Cranial Nerves: PERRL 3 mm/brisk. EOMI, visual fields full, slight left facial droop, facial sensation intact, hearing intact, tongue midline, normal sternocleidomastoid and trapezius muscle strength.   Motor:  5/5 in all extremities. Decreased grip L hand.  Diminished fine finger movements on the left.  Orbits right over left upper extremity. Tone: is normal and bulk is normal Sensation- Intact to light touch bilaterally Coordination: FTN intact Orbiting seen L hand. No ataxia in BLE.  Gait- deferred  ASSESSMENT/PLAN Austin Ingram. is a 62 y.o. male with a past medical history of CAD s/p BMS-LAD in 2008, DES-P RCA in 2014, DES-LCx in 2018, ICM, chronic combined systolic  and diastolic heart failure, LV thrombus on eliquis, sinus bradycardia, hypertension, hyperlipidemia, CKD stage II, and OSA. Austin Ingram went to take a shower at 1030. While Austin Ingram was in the shower when Austin Ingram dropped the wash cloth and then noticed weakness on the left side. On EMS arrival Austin Ingram had a facial droop and left sided weakness in both his upper and lower extremity. Austin Ingram did take all of his medication today including his Eliquis.   LKW: 1030 06/01/22 tpa given?: no, on Eliquis  Premorbid modified Rankin scale (mRS):  0-Completely asymptomatic and back to baseline post-stroke   Acute Ischemic Stroke _right internal capsule Etiology:  ?cardioembolic from history of LV thrombus with eliquis failure   Code Stroke CT head: Possible hypoattenuation in the posterior aspect of the right insula, which could represent acute or subacute infarct in the correct clinical setting. MRI could further evaluate if clinically warranted ASPECTS 9   CTA head & neck:  Occlusion of a right M3 branch in the insula. No other intracranial vascular occlusion or significant stenosis. No hemodynamically significant stenosis in the neck.  MRI: Small acute or subacute infarct in the posterior limb of the right internal capsule and adjacent subinsular white matter. This correlates with the abnormality seen on same day CT head.  2D Echo  LVEF 30-35%,  Grade I diastolic dysfunction Akinesis of left ventricular, mid-apical anteroseptal wall, anterolateral wall, apical segment, inferolateral wall, inferoseptal wall and inferior wall  Trivial MVR.   LDL 137 HgbA1c 5.9 VTE prophylaxis - Eliquis    Diet   Diet Heart Room service appropriate? Yes; Fluid consistency: Thin   aspirin 81 mg daily and Eliquis (apixaban) daily prior to admission, now on aspirin 81 mg daily and Eliquis (apixaban) daily. Recommend changing to Coumadin given on-going apical akinesis and EF <35%. and outpatient consideration for ICD. Will need to bridge with  lovenox for 3-5 days until INR is in therapeutic range.  Therapy recommendations:  no follow-up Disposition:  home  Hypertension Home meds:  Norvasc 10mg , Hydralazine 50mg  Permissive hypertension (OK if < 220/120) but gradually normalize in 5-7 days Long-term BP goal normotensive  Hyperlipidemia Home meds:  Lipitor 40mg , LDL 137, goal < 70 Increase to Lipitor 80mg  Continue statin at discharge  Diabetes type II  Home meds:  none, no history HgbA1c 5.9, goal < 7.0 CBGs Recent Labs    06/01/22 1156  GLUCAP 138*    SSI  Other Stroke Risk Factors Former Cigarette smoker Current ETOH use, alcohol level <10, advised to drink no more than 1-2 drink(s) a day Substance abuse - UDS:  THC POSITIVE. Patient advised to stop using due to stroke risk. Coronary artery disease S/p LAD stent DES to pRCA DES to dCx 2018 with residual disease Home Meds: Imdur,  Obstructive sleep apnea Congestive heart failure   Other Active Problems Asthma CKD, Stage II Arthritis Anxiety/Depression Home meds: Celexa  Hospital day # 0   Pt seen by Neuro NP/APP and later by MD. Note/plan to be edited by MD as needed.    , DNP, AGACNP-BC [facility] Please use AMION for pager and EPIC for messaging  STROKE MD NOTE :  I have personally obtained history,examined this patient, reviewed notes, independently viewed imaging studies, participated in medical decision making and plan of care.ROS completed by me personally and pertinent positives fully documented  I have made any additions or clarifications directly to the above note. Agree with note above.  Patient presented with left hemiparesis secondary to small right subcortical infarct.  Austin Ingram has history of known LV thrombus and has been on Eliquis which she was compliant with.  Recommend discussed with cardiology need for further anticoagulation and if so may consider switching Eliquis to warfarin due to Eliquis failure.  Patient  counseled to quit using marijuana and smoking.  Aggressive risk factor modification.  Long discussion with patient and wife and answered questions.  Discussed with Dr. and cardiology team.  Greater than 50% time during this 50-minute visit was spent in counseling and coordination of care about his stroke and cardiac clot and discussion about anticoagulation and risk-benefit and answering questions  07/31/22, MD Medical Director [facility] Pager: [phone] 06/02/2022 3:38 PM   To contact Stroke Continuity provider, please refer to Delia Heady. After hours, contact General Neurology

## 2022-06-02 NOTE — Evaluation (Signed)
Physical Therapy Evaluation Patient Details Name: Austin Ingram. MRN: [id number] DOB: October 08, 1960 Today's Date: 06/02/2022  History of Present Illness  Patient is a 62 y/o male who presents on 2/1 with left sided weakness and facial droop. Brain MRI- small acute or subacute infarct in posterior limb of right internal capsule. PMH includes CAD, depression, CKD, HTN, MI, OSA, LV (left ventricular) mural thrombus on anticoagulation.  Clinical Impression  Patient tolerated transfers, ambulation and stair training without difficulty or LOB independently. Scored 24/24 on DGI indicating not at risk for falls. Pt is independent with ADLs/IADLs at baseline and reports all symptoms have resolved.  Tolerated head turns, changes in direction, walking backwards, picking objects off floor etc without difficulty or issues. Wife reports pt's cognition is baseline and WFL. Education re: BeFAST. Encouraged walking while in the hospital. Does not require skilled therapy services as pt functioning at baseline. All education completed. Discharge from therapy.     Recommendations for follow up therapy are one component of a multi-disciplinary discharge planning process, led by the attending physician.  Recommendations may be updated based on patient status, additional functional criteria and insurance authorization.  Follow Up Recommendations No PT follow up      Assistance Recommended at Discharge None  Patient can return home with the following       Equipment Recommendations None recommended by PT  Recommendations for Other Services       Functional Status Assessment Patient has not had a recent decline in their functional status     Precautions / Restrictions Precautions Precautions: None Restrictions Weight Bearing Restrictions: No      Mobility  Bed Mobility Overal bed mobility: Independent                  Transfers Overall transfer level: Independent                 General  transfer comment: Stood from EOB  x1, no difficulties.    Ambulation/Gait Ambulation/Gait assistance: Modified independent (Device/Increase time) Gait Distance (Feet): 400 Feet Assistive device: None Gait Pattern/deviations: Step-through pattern   Gait velocity interpretation: >2.62 ft/sec, indicative of community ambulatory   General Gait Details: Steady gait, tolerating higher level balance challenges without difficulty or LOB. See balance section for details.  Stairs Stairs: Yes Stairs assistance: Independent Stair Management: Alternating pattern Number of Stairs: 5 (x4) General stair comments: Good technique no use of rails needed.  Wheelchair Mobility    Modified Rankin (Stroke Patients Only) Modified Rankin (Stroke Patients Only) Pre-Morbid Rankin Score: No symptoms Modified Rankin: No symptoms     Balance Overall balance assessment: Needs assistance Sitting-balance support: Feet supported, No upper extremity supported Sitting balance-Leahy Scale: Normal     Standing balance support: During functional activity Standing balance-Leahy Scale: Normal                   Standardized Balance Assessment Standardized Balance Assessment : Dynamic Gait Index   Dynamic Gait Index Level Surface: Normal Change in Gait Speed: Normal Gait with Horizontal Head Turns: Normal Gait with Vertical Head Turns: Normal Gait and Pivot Turn: Normal Step Over Obstacle: Normal Step Around Obstacles: Normal [place]: Normal Total Score: 24       Pertinent Vitals/Pain Pain Assessment Pain Assessment: No/denies pain    Home Living Family/patient expects to be discharged to:: Private residence Living Arrangements: Spouse/significant other Available Help at Discharge: Family;Available PRN/intermittently Type of Home: House Home Access: Stairs to enter   [employer] of  [place]: 1 Alternate Level Stairs-Number of [place]: 1 flight Home Layout: Two level;Bed/bath  upstairs Home Equipment: None      Prior Function Prior Level of Function : Independent/Modified Independent             Mobility Comments: Independent, active, bikes, runs etc, does not work ADLs Comments: independent     Hand Dominance   Dominant Hand: Right    Extremity/Trunk Assessment   Upper Extremity Assessment Upper Extremity Assessment: Overall WFL for tasks assessed    Lower Extremity Assessment Lower Extremity Assessment: Overall WFL for tasks assessed    Cervical / Trunk Assessment Cervical / Trunk Assessment: Normal  Communication   Communication: No difficulties  Cognition Arousal/Alertness: Awake/alert Behavior During Therapy: WFL for tasks assessed/performed Overall Cognitive Status: Within Functional Limits for tasks assessed                                          General Comments General comments (skin integrity, edema, etc.): Wife present during session.    Exercises     Assessment/Plan    PT Assessment Patient does not need any further PT services  PT Problem List         PT Treatment Interventions      PT Goals (Current goals can be found in the Care Plan section)  Acute Rehab PT Goals Patient Stated Goal: to go home PT Goal Formulation: All assessment and education complete, DC therapy    Frequency       Co-evaluation               AM-PAC PT "6 Clicks" Mobility  Outcome Measure Help needed turning from your back to your side while in a flat bed without using bedrails?: None Help needed moving from lying on your back to sitting on the side of a flat bed without using bedrails?: None Help needed moving to and from a bed to a chair (including a wheelchair)?: None Help needed standing up from a chair using your arms (e.g., wheelchair or bedside chair)?: None Help needed to walk in hospital room?: None Help needed climbing 3-5 [place] with a railing? : None 6 Click Score: 24    End of Session    Activity Tolerance: Patient tolerated treatment well Patient left: in bed;with call bell/phone within reach Nurse Communication: Mobility status PT Visit Diagnosis: Unsteadiness on feet (R26.81)    Time: 4268-3419 PT Time Calculation (min) (ACUTE ONLY): 14 min   Charges:   PT Evaluation $PT Eval Low Complexity: 1 Low          Marisa Severin, PT, DPT Acute Rehabilitation Services Secure chat preferred Office [place] 06/02/2022, 8:30 AM

## 2022-06-02 NOTE — Progress Notes (Signed)
  Transition of Care [facility]) Screening Note   Patient Details  Name: Austin Ingram. Date of Birth: 05-24-1960   Transition of Care [facility]) CM/SW Contact:    Pollie Friar, RN Phone Number: 06/02/2022, 4:14 PM   Pt is from home with spouse. Transition of Care Department [facility]) has reviewed patient. We will continue to monitor patient advancement through interdisciplinary progression rounds. If new patient transition needs arise, please place a TOC consult.

## 2022-06-02 NOTE — Progress Notes (Signed)
Pt requested to shower. Per Dr. Marcina Millard, okay to shower.

## 2022-06-02 NOTE — Progress Notes (Addendum)
ANTICOAGULATION CONSULT NOTE - Initial Consult  Pharmacy Consult for warfarin Indication:  hx LV thrombus, apical kinesis, CVA  Allergies  Allergen Reactions   Adhesive [Tape] Other (See Comments)    SKIN SCARRING/IRRITATION. PAPER TAPE IS OKAY    Patient Measurements: Height: 5\' 9"  (175.3 cm) Weight: 90 kg (198 lb 6.6 oz) IBW/kg (Calculated) : 70.7  Vital Signs: Temp: 98.7 F (37.1 C) (02/02 1128) Temp Source: Oral (02/02 1128) BP: 179/92 (02/02 1128) Pulse Rate: 54 (02/02 1128)  Labs: Recent Labs    06/01/22 1213 06/01/22 1250 06/01/22 1300 06/02/22 0622  HGB 13.6 15.0  --  14.7  HCT 44.4 44.0  --  48.4  PLT 231  --   --  216  APTT  --   --  25  --   LABPROT  --   --  13.3  --   INR  --   --  1.0  --   CREATININE 1.51* 1.40*  --  1.25*    Estimated Creatinine Clearance: 70.6 mL/min (A) (by C-G formula based on SCr of 1.25 mg/dL (H)).   Medical History: Past Medical History:  Diagnosis Date   Allergic rhinitis    Anxiety    Arthritis    "lower back, right thumb, knees" (10/04/2012)   Asthma    "grew out of it" (10/04/2012)   CKD (chronic kidney disease), stage II    Coronary artery disease    a. s/p prior stent to LAD;  b. LHC 6/14: DES to pRCA. c. DES to dCx 01/2017 with residual disease.   Depression    Hyperlipidemia    Hypertension    Ischemic cardiomyopathy    LV (left ventricular) mural thrombus    MI (myocardial infarction) ([place]) 03/2007   OSA (obstructive sleep apnea)    mild   Pneumonia    "as a child" (10/04/2012)   Sinus bradycardia     Medications:  Scheduled:   aspirin EC  81 mg Oral Daily   [START ON 06/03/2022] atorvastatin  80 mg Oral Daily   citalopram  40 mg Oral Daily   finasteride  5 mg Oral Daily   tamsulosin  0.4 mg Oral Q breakfast    Assessment: 62 yo M on apixaban PTA for hx of LV thrombus.  Pt admitted with new CVA (symptoms now resolved).  Discussion between cardiology and neuro has agreed up switching patient to  warfarin.  Baseline INR 1.  Goal of Therapy:  INR 2-3 Monitor platelets by anticoagulation protocol: Yes   Plan:  Warfarin 7.5mg  PO x 1 tonight. Daily INR   [employer], Pharm.D., BCPS Clinical Pharmacist Clinical phone for 06/02/2022 from 7:30-3:00 is [phone].  **Pharmacist phone directory can be found on [email or web address] listed under [place].  06/02/2022 2:23 PM  ___________________________________________________________  Addendum:  Will add Lovenox for bridging while INR subtherapeutic.    Start Lovenox 90mg  SQ q12h - first dose tonight 2200.  [employer], Pharm.D., BCPS Clinical Pharmacist Clinical phone for 06/02/2022 from 7:30-3:00 is [phone].  **Pharmacist phone directory can be found on [email or web address] listed under [place].  06/02/2022 2:53 PM

## 2022-06-02 NOTE — Progress Notes (Signed)
SLP Cancellation Note  Patient Details Name: Austin Ingram. MRN: [id number] DOB: 12/04/1960   Cancelled treatment:       Reason Eval/Treat Not Completed: SLP screened, no needs identified, will sign off   Ouita Nish, Katherene Ponto 06/02/2022, 11:54 AM

## 2022-06-02 NOTE — Progress Notes (Addendum)
Daily Progress Note Intern Pager: [phone]  Patient name: Austin Ingram. Medical record number: [id number] Date of birth: May 12, 1960 Age: 62 y.o. Gender: male  Primary Care Provider: Alroy Ingram, L.Marlou Sa, MD Consultants: neurology Code Status: FULL  Pt Overview and Major Events to Date:  2/1 admitted  Assessment and Plan: Austin Ingram. is a 62 y.o. male PMH of LV mural thrombus on eliquis, HTN, HLD, CKD, HF (last echo 2022 with EF 30-35%, no thrombus but had apical akinesis), CAD s/p PCI 2004 RCA and 2008 LAD, OSA presenting with left-sided facial weakness, arm weakness, leg weakness.   Differential for presentation of this includes TIA vs stroke (possibly thromboembolic from apical akinesis).    * Stroke ([place]) Lasted approximately 90 minutes with full resolution by admission. MRI brain small acute v subacute infarct of posterior limb of internal capsule. CT angio shows occlusion of a right M3 branch in the insula. Per neuro continue Eliquis vs switch to coumadin pending cardiology input. ABCD2 score 2-Day Stroke Risk: 4.1%, 7-Day Stroke Risk: 5.9%, 90-Day Stroke Risk: 9.8%. - Admit to FMTS, progrssive, attending Dr. Ardelia Ingram - Neurology following, appreciate recs - PT/OT consulted, appreciate recs - F/u risk stratification labs  - Telemetry - Neuro checks q4h  Cardiac akinesia Echo 06/01/2022 apical akinesis. Possible cardioembolic source of TIA. Cardiology notes recommend continuing Eliquis 07/19/2021. Per neuro continue Eliquis vs switch to coumadin pending cardiology input. - Continue Eliquis 5 mg daily  Chronic combined systolic and diastolic heart failure (HCC) EF 30 to 35% with apical akinesis 06/01/2022.  Followed by cardiology and heart failure outpatient. -Continue Hydralazine 3 times daily -Continue Imdur 30 mg daily -Hold Spironolactone, Entresto, farxiga given rise in Cr  CAD (coronary artery disease) Hx of anterior MI 2008, cath RCA 10/18 and repeat cath 11/22 with  total occlusion of RCA-patent stents in LAD, Lcx and prox RCA. -continue ASA 81 mg -increased atorvastatin from 40 to 80 mg daily  Sinus bradycardia Around 49 in ED. EKG with sinus rhythm and prolonged PR. ICD was discussed with HF team 08/08/2021 however patient declined at that time. -avoid beta blockers -monitor on tele  HTN (hypertension) 638V -564P systolics. Permissive HTN ok but gradually normalize in 5-7 days  -Held home amlodipine 10 mg daily  CKD (chronic kidney disease), stage II Cr 1.4 on admission. Last Cr in 2023 was 1.21. Now 1.25 and closer to baseline. - Avoid nephrotoxic agents - Encourage increased PO intake  Other chronic problems: OSA HLD - increased atorvastatin from 40 to 80 mg for LDL goal < 70 MDD  FEN/GI: regular PPx: apixaban Dispo:Home  pending neuro clearance .  Subjective:  Patient feels well and feels ready to go home. He has no somatic complaints. Informed patient we are waiting for neuro clearance before we can send him home.  Objective: Temp:  [97.7 F (36.5 C)-99.1 F (37.3 C)] 98.7 F (37.1 C) (02/02 1128) Pulse Rate:  [52-105] 54 (02/02 1128) Resp:  [14-20] 18 (02/02 1128) BP: (125-179)/(79-114) 179/92 (02/02 1128) SpO2:  [97 %-100 %] 97 % (02/02 1128) Physical Exam: General: well-appearing and in no acute distress HEENT: normocephalic and atraumatic Respiratory: normal respiratory effort and on RA Extremities: moving all extremities spontaneously Gastrointestinal: non-tender and non-distended Cardiovascular: regular rate  Laboratory: Most recent CBC Lab Results  Component Value Date   WBC 6.7 06/02/2022   HGB 14.7 06/02/2022   HCT 48.4 06/02/2022   MCV 76.7 (L) 06/02/2022   PLT 216 06/02/2022   Most recent BMP  Latest Ref Rng & Units 06/02/2022    6:22 AM  BMP  Glucose 70 - 99 mg/dL 91   BUN 6 - 20 mg/dL 15   Creatinine 0.61 - 1.24 mg/dL 1.25   Sodium 135 - 145 mmol/L 139   Potassium 3.5 - 5.1 mmol/L 4.3    Chloride 98 - 111 mmol/L 107   CO2 22 - 32 mmol/L 23   Calcium 8.9 - 10.3 mg/dL 9.1    Imaging/Diagnostic Tests: CT head code stroke (2/1) IMPRESSION: 1. Possible hypoattenuation in the posterior aspect of the right insula, which could represent acute or subacute infarct in the correct clinical setting. MRI could further evaluate if clinically warranted. 2. ASPECTS is 9   MR brain w/o contrast (2/1) IMPRESSION: Small acute or subacute infarct in the posterior limb of the right internal capsule and adjacent subinsular white matter. This correlates with the abnormality seen on same day CT head.  CT angio head and neck w w/o contrast (2/1) IMPRESSION: 1. Occlusion of a right M3 branch in the insula. No other intracranial vascular occlusion or significant stenosis. 2. No hemodynamically significant stenosis in the neck.  Echo w/ bubble (2/1) IMPRESSION:  1. Left ventricular ejection fraction, by estimation, is 30 to 35%. The  left ventricle has moderately decreased function. Left ventricular  endocardial border not optimally defined to evaluate regional wall motion.  The left ventricular internal cavity  size was mildly dilated. Left ventricular diastolic parameters are  consistent with Grade I diastolic dysfunction (impaired relaxation). There  is akinesis of the left ventricular, mid-apical anteroseptal wall,  anterolateral wall, apical segment,  inferolateral wall, inferoseptal wall and inferior wall.   2. Right ventricular systolic function is normal. The right ventricular  size is normal.   3. Left atrial size was moderately dilated.   4. The mitral valve is normal in structure. Trivial mitral valve  regurgitation.   5. The aortic valve is tricuspid. Aortic valve regurgitation is not  visualized.   Austin Phenes, MD 06/02/2022, 1:17 PM  PGY-1, [place] Intern pager: [phone], text pages welcome Secure chat group [place]

## 2022-06-03 ENCOUNTER — Other Ambulatory Visit: Payer: Self-pay | Admitting: Cardiovascular Disease

## 2022-06-05 ENCOUNTER — Other Ambulatory Visit: Payer: Self-pay

## 2022-06-05 MED ORDER — ISOSORBIDE MONONITRATE ER 60 MG PO TB24: 60.0000 mg | ORAL_TABLET | Freq: Every day | ORAL | 3 refills | Status: DC

## 2022-06-05 MED ORDER — ISOSORBIDE MONONITRATE ER 30 MG PO TB24: 30.0000 mg | ORAL_TABLET | Freq: Every day | ORAL | 3 refills | Status: DC

## 2022-06-05 NOTE — Telephone Encounter (Signed)
Pt's medications were sent to pt's pharmacy as requested. Confirmation received.  

## 2022-06-06 ENCOUNTER — Ambulatory Visit (HOSPITAL_COMMUNITY): Admit: 2022-06-06 | Payer: Federal, State, Local not specified - PPO | Admitting: Gastroenterology

## 2022-06-06 ENCOUNTER — Encounter (HOSPITAL_COMMUNITY): Payer: Self-pay

## 2022-06-06 SURGERY — COLONOSCOPY WITH PROPOFOL
Anesthesia: Monitor Anesthesia Care

## 2022-06-07 ENCOUNTER — Ambulatory Visit: Payer: Federal, State, Local not specified - PPO | Attending: Cardiovascular Disease

## 2022-06-07 DIAGNOSIS — Z7901 Long term (current) use of anticoagulants: Secondary | ICD-10-CM | POA: Diagnosis not present

## 2022-06-07 DIAGNOSIS — I639 Cerebral infarction, unspecified: Secondary | ICD-10-CM

## 2022-06-07 DIAGNOSIS — M25511 Pain in right shoulder: Secondary | ICD-10-CM | POA: Diagnosis not present

## 2022-06-07 DIAGNOSIS — I513 Intracardiac thrombosis, not elsewhere classified: Secondary | ICD-10-CM

## 2022-06-07 DIAGNOSIS — I429 Cardiomyopathy, unspecified: Secondary | ICD-10-CM | POA: Diagnosis not present

## 2022-06-07 LAB — POCT INR: INR: 1.1 — AB (ref 2.0–3.0)

## 2022-06-07 NOTE — Patient Instructions (Addendum)
Description   Take 1.5 tablets today and then START taking Warfarin 1 tablet daily EXCEPT 1.5 tablets on Mondays.   Stay consistent with greens each week (3-4 times per week)   Coumadin Clinic [phone]     A full discussion of the nature of anticoagulants has been carried out.  A benefit risk analysis has been presented to the patient, so that they understand the justification for choosing anticoagulation at this time. The need for frequent and regular monitoring, precise dosage adjustment and compliance is stressed.  Side effects of potential bleeding are discussed.  The patient should avoid any OTC items containing aspirin or ibuprofen, and should avoid great swings in general diet.  Avoid alcohol consumption.  Call if any signs of abnormal bleeding.

## 2022-06-09 DIAGNOSIS — M25511 Pain in right shoulder: Secondary | ICD-10-CM | POA: Diagnosis not present

## 2022-06-15 ENCOUNTER — Ambulatory Visit: Payer: Federal, State, Local not specified - PPO | Attending: Cardiology

## 2022-06-25 ENCOUNTER — Other Ambulatory Visit: Payer: Self-pay | Admitting: Student

## 2022-08-01 NOTE — Progress Notes (Unsigned)
  Electrophysiology Office Follow up Visit Note:    Date:  08/01/2022   ID:  Austin Pew Sparr Jr., DOB [birth year], MRN [id number]  PCP:  Alroy Dust, L.Marlou Sa, MD  [facility] HeartCare Cardiologist:  Lauree Chandler, MD  [facility] HeartCare Electrophysiologist:  Vickie Epley, MD    Interval History:    Austin Ingram. is a 62 y.o. male who presents for a follow up visit.   I saw the patient June 02, 2022 when he was hospitalized.  Cardioembolic stroke.  There is a longstanding history of chronic systolic heart failure he has a history of LV thrombus.  When I met him in the hospital, we discussed ICD implant but the patient declined.  He was started on anticoagulation during the hospitalization.  Loop recorder was not implanted because there would be an ongoing need for anticoagulation.      Past medical, surgical, social and family history were reviewed.  ROS:   Please see the history of present illness.    All other systems reviewed and are negative.  EKGs/Labs/Other Studies Reviewed:    The following studies were reviewed today:     Physical Exam:    VS:  There were no vitals taken for this visit.    Wt Readings from Last 3 Encounters:  06/01/22 198 lb 6.6 oz (90 kg)  08/08/21 198 lb (89.8 kg)  07/19/21 203 lb 6.4 oz (92.3 kg)     GEN: *** Well nourished, well developed in no acute distress CARDIAC: ***RRR, no murmurs, rubs, gallops RESPIRATORY:  Clear to auscultation without rales, wheezing or rhonchi       ASSESSMENT:    No diagnosis found. PLAN:    In order of problems listed above:  #Cardioembolic stroke #LV thrombus On Coumadin  #Chronic systolic heart failure NYHA class II.  Follows with the heart failure clinic.  I have met the patient previously and discussed ICD therapy but the patient has always declined.  He declines again today.  EP will see on an as-needed basis.       Signed, Lars Mage, MD, [facility], [facility] 08/01/2022 9:46 PM     Electrophysiology [facility]

## 2022-08-02 ENCOUNTER — Encounter: Payer: Self-pay | Admitting: Cardiology

## 2022-08-02 ENCOUNTER — Ambulatory Visit: Payer: Federal, State, Local not specified - PPO | Attending: Cardiology | Admitting: Cardiology

## 2022-08-02 VITALS — BP 112/74 | HR 54 | Ht 69.0 in | Wt 193.0 lb

## 2022-08-02 DIAGNOSIS — I5022 Chronic systolic (congestive) heart failure: Secondary | ICD-10-CM

## 2022-08-02 DIAGNOSIS — I513 Intracardiac thrombosis, not elsewhere classified: Secondary | ICD-10-CM | POA: Diagnosis not present

## 2022-08-02 DIAGNOSIS — I639 Cerebral infarction, unspecified: Secondary | ICD-10-CM

## 2022-08-02 NOTE — Patient Instructions (Signed)
Medication Instructions:  Your physician recommends that you continue on your current medications as directed. Please refer to the Current Medication list given to you today. *If you need a refill on your cardiac medications before your next appointment, please call your pharmacy*   Follow-Up: At [facility], you and your health needs are our priority.  As part of our continuing mission to provide you with exceptional heart care, we have created designated Provider Care Teams.  These Care Teams include your primary Cardiologist (physician) and Advanced Practice Providers (APPs -  Physician Assistants and Nurse Practitioners) who all work together to provide you with the care you need, when you need it.  We recommend signing up for the patient portal called "MyChart".  Sign up information is provided on this After Visit Summary.  MyChart is used to connect with patients for Virtual Visits (Telemedicine).  Patients are able to view lab/test results, encounter notes, upcoming appointments, etc.  Non-urgent messages can be sent to your provider as well.   To learn more about what you can do with MyChart, go to [email or web address].    Your next appointment:   As Needed  Provider:   Lars Mage, MD

## 2022-08-02 NOTE — Progress Notes (Signed)
  Electrophysiology Office Follow up Visit Note:    Date:  08/02/2022   ID:  Austin Pew Henandez Jr., DOB 1960-12-21, MRN [id number]  PCP:  Alroy Dust, L.Marlou Sa, MD  [facility] HeartCare Cardiologist:  Lauree Chandler, MD  [facility] HeartCare Electrophysiologist:  Vickie Epley, MD    Interval History:    Austin Pew Polzin [name]. is a 62 y.o. male who presents for a follow up visit.   I saw the patient June 02, 2022 when he was hospitalized.  Cardioembolic stroke.  There is a longstanding history of chronic systolic heart failure he has a history of LV thrombus.  When I met him in the hospital, we discussed ICD implant but the patient declined.  He was started on anticoagulation during the hospitalization.  Loop recorder was not implanted because there would be an ongoing need for anticoagulation.  Today, he appears well. We revisited discussion of ICD implant. He still would like to defer this for now.  He denies any palpitations, chest pain, shortness of breath, or peripheral edema. No lightheadedness, headaches, syncope, orthopnea, or PND.      Past medical, surgical, social and family history were reviewed.  ROS:   Please see the history of present illness.    All other systems reviewed and are negative.  EKGs/Labs/Other Studies Reviewed:    The following studies were reviewed today:    Physical Exam:    VS:  BP 112/74   Pulse (!) 54   Ht 5\' 9"  (1.753 m)   Wt 193 lb (87.5 kg)   SpO2 99%   BMI 28.50 kg/m     Wt Readings from Last 3 Encounters:  08/02/22 193 lb (87.5 kg)  06/01/22 198 lb 6.6 oz (90 kg)  08/08/21 198 lb (89.8 kg)     GEN: Well nourished, well developed in no acute distress CARDIAC: RRR, no murmurs, rubs, gallops RESPIRATORY:  Clear to auscultation without rales, wheezing or rhonchi       ASSESSMENT:    1. Acute CVA (cerebrovascular accident)   2. LV (left ventricular) mural thrombus   3. Chronic systolic CHF (congestive heart failure)    PLAN:    In order  of problems listed above:  #Cardioembolic stroke #LV thrombus On Coumadin  #Chronic systolic heart failure NYHA class II.  Follows with the heart failure clinic.  I have met the patient previously and discussed ICD therapy but the patient has always declined.  He declines again today.  EP will see on an as-needed basis.   I,Mathew Stumpf,acting as a Education administrator for Vickie Epley, MD.,have documented all relevant documentation on the behalf of Vickie Epley, MD,as directed by  Vickie Epley, MD while in the presence of Vickie Epley, MD.  I, Vickie Epley, MD, have reviewed all documentation for this visit. The documentation on 08/02/22 for the exam, diagnosis, procedures, and orders are all accurate and complete.  Signed, Lars Mage, MD, [facility], [facility] 08/02/2022 6:57 PM    Electrophysiology  [facility] Heart[facility]

## 2022-08-22 ENCOUNTER — Telehealth: Payer: Self-pay

## 2022-08-22 NOTE — Telephone Encounter (Signed)
Ronie Spies, PA called Coumadin Clinic requesting we reach out to pt regarding missed appt on 06/15/22, 1 week follow-up.  We did call pt and LMOM on day pt no showed for his appt.   Attempted to call pt to reschedule missed follow-up appt, did not get an answer on his mobile number, had to leave a voicemail message.  Per DPR attempted to contact spouse at home number, no answer and voicemail was full and I was unable to leave a message.  Will await call back.

## 2022-08-23 NOTE — Telephone Encounter (Signed)
Pt called back and scheduled an appt for 08/28/22 at 815am. Advised pt it is important to keep the upcoming appt and adhere to scheduled appts.

## 2022-08-27 NOTE — Progress Notes (Unsigned)
Cardiology Office Note:    Date:  08/29/2022   ID:  Austin Flash Pettingill Montez Hageman., DOB 09/09/1960, MRN [id number]  PCP:  Clovis Riley, L.[name], MD   [facility] Providers Cardiologist:  Verne Carrow, MD Electrophysiologist:  Lanier Prude, MD  Advanced Heart Failure:  Marca Ancona, MD {   Referring MD: Clovis Riley, Elbert Ewings.[name], MD   Chief Complaint  Patient presents with   Follow-up    CHF, LV thrombus    History of Present Illness:    Austin Raborn Montez Hageman. is a 62 y.o. male with a hx of ischemic cardiomyopathy, CAD, LV thrombus  previously on Eliquis, recent stroke. Patient is followed by the advanced heart failure clinic, Dr. Lalla Brothers with EP, and Dr. Clifton James and presents today for a 4 month follow up appointment.   Per chart review, patient has at least a 20 year history of CAD, and had PCI to the proximal RCA in 2004. In 2008, he had an anterior MI and was treated with BMS to the LAD. In 01/2027, cardiac catheterization showed triple vessel CAD with chronic total occlusion of the mid RCA. The previously placed stent in the proximal LAD was patent with minimal in-stent restenosis, and there was severe stenosis of the distal Circumflex Artery. He underwent PTCA/DES x1 to the distal circumflex, and his medical therapy was titrated. EF was 35-40% at that time. Echocardiogram on 03/06/19 showed EF 35-40%, with apical dyskinesis, grade II diastolic dysfunction, normal RV systolic function. Study could not rule out apical thrombus, so patient had a limited echocardiogram on 03/12/19 that showed an apical thrombus. Patient was started on eliquis.   Patient was later admitted to the hospital in 03/2021 for evaluation of chest pain.  Echocardiogram on 03/28/2021 showed EF 30% with hypokinesis of the inferoseptal, inferior, distal lateral walls and apical akinesis.  Also showed severe asymmetric LVH, low normal RV systolic function.  Study could not rule out apical thrombus.  Limited echo on 03/29/2021  showed no definitive LV thrombus on contrast imaging.  Wall motion abnormalities were consistent with prior LAD infarct.  Left heart catheterization on 03/29/2021 showed chronic occlusion of the mid RCA with good left-to-right and right to right collaterals, patent stents in the proximal LAD, distal left circumflex, proximal RCA.  There was no new disease to explain chest pain.  Patient was referred to the Advanced Heart Failure clinic on 05/23/21. Was treated with hydralazine and imdur, dapagliflozin, spironolactone. It was noted that patient was an ICD candidate, but patient was not interested at that time. He also has a history of bradycardia therefore not on BB.  Patient was admitted to [facility] from 2/1 - 06/02/2022 for treatment of CVA.  Underwent echocardiogram on 06/01/22 showed EF 30-35%, grade 1 diastolic dysfunction, normal RV systolic function.  Patient was seen by EP for consideration of loop recorder/ICD.  As patient was already on anticoagulation for LV thrombus, it was determined that patient did not need a loop recorder.  Given his CVA despite compliance with Eliquis, it was considered that patient had failed Eliquis and was transitioned to Coumadin per neurology recommendation. Of note, patient only attended one coumadin visit after discharge. He was contacted for repeat INR, which was 1.0.   Today, patient reports that he has been doing well from a cardiovascular standpoint.  He is not as active as he used to be, but he still likes to exercise by walking his dog.  He does not have chest pain or excessive shortness of breath on  exertion.  Denies orthopnea, ankle edema, palpitations, syncope, near syncope, dizziness.  Regarding stroke in 06/2022, patient does report at that time that he was taking a lot of pain medicine for torn rotator cuff, and he believes that this contributed to his symptoms.  Prior to his stroke, patient reports that he was compliant with Eliquis and had not missed  any doses. We discussed his poor compliance with INR follow-up. Reports that his brother and his mother both got sick and were in hospitals in [place] in [place].  He was traveling a lot so he missed some of his Coumadin appointments.  Patient is determined to be compliant with Coumadin moving forward and was seen yesterday for dose adjustment.    Past Medical History:  Diagnosis Date   Allergic rhinitis    Anxiety    Arthritis    "lower back, right thumb, knees" (10/04/2012)   Asthma    "grew out of it" (10/04/2012)   CKD (chronic kidney disease), stage II    Coronary artery disease    a. s/p prior stent to LAD;  b. LHC 6/14: DES to pRCA. c. DES to dCx 01/2017 with residual disease.   Depression    Hyperlipidemia    Hypertension    Ischemic cardiomyopathy    LV (left ventricular) mural thrombus    MI (myocardial infarction) (HCC) 03/2007   OSA (obstructive sleep apnea)    mild   Pneumonia    "as a child" (10/04/2012)   Sinus bradycardia     Past Surgical History:  Procedure Laterality Date   ARTHROPLASTY Right 1993   'crushed; removed bone fragments" (10/04/2012)   CARDIAC CATHETERIZATION  2010   CORONARY ANGIOPLASTY WITH STENT PLACEMENT  2008; 10/04/2012   "1 + 1" (10/04/2012)   CORONARY PRESSURE/FFR STUDY N/A 02/26/2017   Procedure: INTRAVASCULAR PRESSURE WIRE/FFR STUDY;  Surgeon: Kathleene Hazel, MD;  Location: [facility] INVASIVE CV LAB;  Service: Cardiovascular;  Laterality: N/A;   CORONARY STENT INTERVENTION N/A 02/26/2017   Procedure: CORONARY STENT INTERVENTION;  Surgeon: Kathleene Hazel, MD;  Location: [facility] INVASIVE CV LAB;  Service: Cardiovascular;  Laterality: N/A;   EXPLORATORY LAPAROTOMY  1990's?   "went in to repair hernia; found fatty tissue instead; no hernia repair" (10/04/2012)   LEFT HEART CATH AND CORONARY ANGIOGRAPHY N/A 02/26/2017   Procedure: LEFT HEART CATH AND CORONARY ANGIOGRAPHY;  Surgeon: Kathleene Hazel, MD;  Location: [facility] INVASIVE CV LAB;   Service: Cardiovascular;  Laterality: N/A;   LEFT HEART CATH AND CORONARY ANGIOGRAPHY N/A 03/29/2021   Procedure: LEFT HEART CATH AND CORONARY ANGIOGRAPHY;  Surgeon: Swaziland, Peter M, MD;  Location[facility]IVE CV LAB;  Service: Cardiovascular;  Laterality: N/A;   LEFT HEART CATHETERIZATION WITH CORONARY ANGIOGRAM N/A 10/04/2012   Procedure: LEFT HEART CATHETERIZATION WITH CORONARY ANGIOGRAM;  Surgeon: Tonny Bollman, MD;  Location: [facility] CATH LAB;  Service: Cardiovascular;  Laterality: N/A;    Current Medications: Current Meds  Medication Sig   amLODipine (NORVASC) 10 MG tablet Take 10 mg by mouth daily.   amoxicillin-clavulanate (AUGMENTIN) 875-125 MG tablet Take 1 tablet by mouth as needed. For Dental Visits   aspirin EC 81 MG EC tablet Take 1 tablet (81 mg total) by mouth daily. Swallow whole. (Patient taking differently: Take 81 mg by mouth daily.)   atorvastatin (LIPITOR) 80 MG tablet TAKE 1 TABLET BY MOUTH EVERY DAY   azelastine (ASTELIN) 0.1 % nasal spray Place 1 spray into both nostrils as needed for allergies.   citalopram (CELEXA)  40 MG tablet Take 40 mg by mouth daily.   diclofenac Sodium (VOLTAREN) 1 % GEL Apply 1 Application topically 2 (two) times daily as needed (pain).   ENTRESTO 97-103 MG TAKE 1 TABLET BY MOUTH TWICE A DAY (Patient taking differently: Take 1 tablet by mouth 2 (two) times daily.)   etodolac (LODINE) 400 MG tablet Take 400 mg by mouth 2 (two) times daily as needed for mild pain or moderate pain.   FARXIGA 10 MG TABS tablet TAKE 1 TABLET BY MOUTH DAILY BEFORE BREAKFAST.   finasteride (PROSCAR) 5 MG tablet Take 5 mg by mouth daily.   fluticasone (FLONASE) 50 MCG/ACT nasal spray Place 1-2 sprays into both nostrils daily as needed for allergies.   hydrALAZINE (APRESOLINE) 50 MG tablet TAKE 1 TABLET BY MOUTH THREE TIMES A DAY   isosorbide mononitrate (IMDUR) 30 MG 24 hr tablet Take 1 tablet (30 mg total) by mouth daily. Taking a total of 90 mg.   nitroGLYCERIN  (NITROSTAT) 0.4 MG SL tablet PLACE 1 TABLET UNDER THE TONGUE EVERY 5 MINUTES AS NEEDED FOR CHEST PAIN. PLEASE SCHEDULE YEARLY APPOINTMENT FOR FUTURE REFILLS. THANK YOU (Patient taking differently: Place 0.4 mg under the tongue every 5 (five) minutes as needed for chest pain.)   spironolactone (ALDACTONE) 25 MG tablet Take 1 tablet (25 mg total) by mouth daily.   tamsulosin (FLOMAX) 0.4 MG CAPS capsule Take 0.4 mg by mouth daily with breakfast.    warfarin (COUMADIN) 5 MG tablet Take 1.5 tablets (7.5 mg total) by mouth daily for 1 day, THEN 1 tablet (5 mg total) daily for 4 days. (Patient taking differently: Take 1 tablet daily EXCEPT 1.5 tablets Monday, Wednesday and Friday.)     Allergies:   Adhesive [tape]   Social History   Socioeconomic History   Marital status: Married    Spouse name: Not on file   Number of children: 1   Years of education: Not on file   Highest education level: Not on file  Occupational History    Employer: [employer]  Tobacco Use   Smoking status: Former    Packs/day: 0.50    Years: 20.00    Additional pack years: 0.00    Total pack years: 10.00    Types: Cigarettes    Quit date: 12/31/1998    Years since quitting: 23.6   Smokeless tobacco: Never  Vaping Use   Vaping Use: Never used  Substance and Sexual Activity   Alcohol use: Yes    Comment: 10/04/2012 "1 beer plus 2 mixed drinks once/month"   Drug use: Yes    Frequency: 14.0 times per week    Types: Marijuana    Comment: smokes 2 joints/day   Sexual activity: Yes  Other Topics Concern   Not on file  Social History Narrative   Not on file   Social Determinants of Health   Financial Resource Strain: Not on file  Food Insecurity: No Food Insecurity (06/01/2022)   Hunger Vital Sign    Worried About Running Out of Food in the Last Year: Never true    Ran Out of Food in the Last Year: Never true  Transportation Needs: No Transportation Needs (06/01/2022)   PRAPARE - Scientist, research (physical sciences) (Medical): No    Lack of Transportation (Non-Medical): No  Physical Activity: Inactive (09/20/2017)   Exercise Vital Sign    Days of Exercise per Week: 0 days    Minutes of Exercise per Session: 0 min  Stress: Stress Concern Present (09/20/2017)   [employer] of Occupational Health - Occupational Stress Questionnaire    Feeling of Stress : Very much  Social Connections: Not on file     Family History: The patient's family history includes Atrial fibrillation in his mother; CAD in his maternal grandfather and maternal grandmother; Diabetes type II in his mother; Hypertension in his mother.  ROS:   Please see the history of present illness.     All other systems reviewed and are negative.  EKGs/Labs/Other Studies Reviewed:    The following studies were reviewed today: Cardiac Studies & Procedures   CARDIAC CATHETERIZATION  CARDIAC CATHETERIZATION 03/29/2021  Narrative   Ost RCA to Prox RCA lesion is 40% stenosed.   Prox RCA lesion is 50% stenosed.   RV Branch lesion is 80% stenosed.   Prox RCA to Mid RCA lesion is 100% stenosed.   Prox LAD lesion is 25% stenosed.   Mid Cx to Dist Cx lesion is 10% stenosed.   1st Sept lesion is 80% stenosed.   LV end diastolic pressure is normal.  Chronic occlusion of the mid RCA with good left to right and right to right collaterals. Patent stents in the proximal LAD, distal LCx and proximal RCA. Normal LVEDP 7 mm Hg  Plan: no new disease to explain his clinical scenario. Recommend continued medical therapy. OK to resume Eliquis this PM.  Findings Coronary Findings Diagnostic  Dominance: Right  Left Main Vessel was injected. Vessel is normal in caliber. Vessel is angiographically normal.  Left Anterior Descending Prox LAD lesion is 25% stenosed. The lesion was previously treated using a bare metal stent over 2 years ago.  First Septal Branch 1st Sept lesion is 80% stenosed.  Left Circumflex Mid Cx to Dist Cx  lesion is 10% stenosed. The lesion was previously treated using a drug eluting stent over 2 years ago.  Right Coronary Artery Ost RCA to Prox RCA lesion is 40% stenosed. The lesion was previously treated . Prox RCA lesion is 50% stenosed. Prox RCA to Mid RCA lesion is 100% stenosed.  Acute Marginal Branch Collaterals Acute Mrg filled by collaterals from RV Branch.  Right Ventricular Branch RV Branch lesion is 80% stenosed.  Right Posterior Descending Artery Collaterals RPDA filled by collaterals from Dist LAD.  Intervention  No interventions have been documented.   CARDIAC CATHETERIZATION  CARDIAC CATHETERIZATION 02/26/2017  Narrative  Ost RCA to Prox RCA lesion, 40 %stenosed.  Prox RCA lesion, 60 %stenosed.  Mid RCA lesion, 100 %stenosed.  1st Mrg lesion, 30 %stenosed.  Mid Cx lesion, 75 %stenosed.  Ost LAD to Prox LAD lesion, 20 %stenosed.  1st Diag lesion, 60 %stenosed.  1. Triple vessel CAD 2. Patent stent proximal LAD with minimal in stent restenosis 3. Severe stenosis distal Circumflex artery 4. Successful PTCA/DES x 1 distal Circumflex 5. Patent proximal RCA stent followed by chronic total occlusion of RCA in the mid segment. The distal RCA fills from left to right collaterals.  Recommendations: Will continue DAPT with ASA and Plavix. Continue statin. He does not tolerate ARB,Ace-inh or beta blockers. Same day PCI discharge. Will need office follow up in 1-2 weeks.  Findings Coronary Findings Diagnostic  Dominance: Right  Left Anterior Descending Vessel is large. The lesion was previously treated using a bare metal stent over 2 years ago.  First Diagonal Branch Vessel is small in size.  First Septal Branch Vessel is small in size.  Second Diagonal Branch Vessel is small in size.  Second Septal Branch Vessel is small in size.  Third Diagonal Branch Vessel is small in size.  Third Septal Branch Vessel is small in size.  Ramus  Intermedius Vessel is small.  Left Circumflex Vessel is large. Pressure wire/FFR was performed on the lesion. FFR: 0.82.  First Obtuse Marginal Branch Vessel is moderate in size.  Second Obtuse Marginal Branch Vessel is small in size.  Third Obtuse Marginal Branch Vessel is moderate in size.  Right Coronary Artery Vessel is large. The lesion was previously treated using a drug eluting stent over 2 years ago.  The lesion is chronically occluded.  Right Posterior Descending Artery Collaterals RPDA filled by collaterals from 3rd Sept.  Intervention  No interventions have been documented.   STRESS TESTS  MYOCARDIAL PERFUSION IMAGING 08/16/2020  Narrative  The left ventricular ejection fraction is moderately decreased (30-44%).  Nuclear stress EF: 31%.  There was no ST segment deviation noted during stress.  Defect 1: There is a large defect of severe severity present in the mid anteroseptal, apical anterior, apical septal, apical inferior, apical lateral and apex location.  Findings consistent with prior myocardial infarction.  This is a high risk study.  Abnormal, high risk stress nuclear study with prior distal anteroseptal and apical infarct but no ischemia.  Gated ejection fraction 31% with global hypokinesis and akinesis of the apex.  Severe left ventricular enlargement.  Study interpreted as high risk due to reduced LV function.   ECHOCARDIOGRAM  ECHOCARDIOGRAM COMPLETE BUBBLE STUDY 06/01/2022  Narrative ECHOCARDIOGRAM REPORT    Patient Name:   Glendon Gwin Montez Hageman. Date of Exam: 06/01/2022 Medical Rec #:  [id number]       Height:       69.0 in Accession #:    [id number]      Weight:       198.4 lb Date of Birth:  1960/05/27       BSA:          2.059 m Patient Age:    61 years        BP:           158/114 mmHg Patient Gender: M               HR:           49 bpm. Exam Location:  Inpatient  Procedure: 2D Echo, Cardiac Doppler, Color Doppler, Intracardiac  Opacification Agent and Saline Contrast Bubble Study  Indications:    Stroke 434.91 / I63.9  History:        Patient has prior history of Echocardiogram examinations, most recent 03/29/2021. CHF and Cardiomyopathy, CAD and Previous Myocardial Infarction, Stroke, Arrythmias:Bradycardia; Risk Factors:Hypertension, Dyslipidemia, Former Smoker and Sleep Apnea.  Sonographer:    Aron Baba Referring Phys: [id number] DEVON SHAFER   Sonographer Comments: Image acquisition challenging due to respiratory motion. IMPRESSIONS   1. Left ventricular ejection fraction, by estimation, is 30 to 35%. The left ventricle has moderately decreased function. Left ventricular endocardial border not optimally defined to evaluate regional wall motion. The left ventricular internal cavity size was mildly dilated. Left ventricular diastolic parameters are consistent with Grade I diastolic dysfunction (impaired relaxation). There is akinesis of the left ventricular, mid-apical anteroseptal wall, anterolateral wall, apical segment, inferolateral wall, inferoseptal wall and inferior wall. 2. Right ventricular systolic function is normal. The right ventricular size is normal. 3. Left atrial size was moderately dilated. 4. The mitral valve is normal in structure. Trivial mitral valve regurgitation. 5. The aortic valve is tricuspid.  Aortic valve regurgitation is not visualized.  FINDINGS Left Ventricle: Left ventricular ejection fraction, by estimation, is 30 to 35%. The left ventricle has moderately decreased function. Left ventricular endocardial border not optimally defined to evaluate regional wall motion. Definity contrast agent was given IV to delineate the left ventricular endocardial borders. The left ventricular internal cavity size was mildly dilated. There is no left ventricular hypertrophy. Left ventricular diastolic parameters are consistent with Grade I diastolic dysfunction (impaired relaxation).  Right  Ventricle: The right ventricular size is normal. No increase in right ventricular wall thickness. Right ventricular systolic function is normal.  Left Atrium: Left atrial size was moderately dilated.  Right Atrium: Right atrial size was normal in size.  Pericardium: There is no evidence of pericardial effusion.  Mitral Valve: The mitral valve is normal in structure. Trivial mitral valve regurgitation.  Tricuspid Valve: The tricuspid valve is normal in structure. Tricuspid valve regurgitation is trivial.  Aortic Valve: The aortic valve is tricuspid. Aortic valve regurgitation is not visualized.  Pulmonic Valve: The pulmonic valve was normal in structure. Pulmonic valve regurgitation is trivial.  Aorta: The aortic root was not well visualized.  IAS/Shunts: The interatrial septum was not well visualized. Agitated saline contrast was given intravenously to evaluate for intracardiac shunting.   LEFT VENTRICLE PLAX 2D LVIDd:         5.50 cm   Diastology LVIDs:         3.70 cm   LV e' medial:    5.44 cm/s LV PW:         1.50 cm   LV E/e' medial:  10.8 LV IVS:        1.20 cm   LV e' lateral:   4.24 cm/s LVOT diam:     2.20 cm   LV E/e' lateral: 13.8 LV SV:         81 LV SV Index:   39 LVOT Area:     3.80 cm   RIGHT VENTRICLE RV S prime:     18.20 cm/s TAPSE (M-mode): 2.2 cm  LEFT ATRIUM             Index        RIGHT ATRIUM           Index LA diam:        3.70 cm 1.80 cm/m   RA Area:     13.00 cm LA Vol (A2C):   64.1 ml 31.13 ml/m  RA Volume:   27.40 ml  13.31 ml/m LA Vol (A4C):   65.9 ml 32.01 ml/m LA Biplane Vol: 66.5 ml 32.30 ml/m AORTIC VALVE             PULMONIC VALVE LVOT Vmax:   81.30 cm/s  PR End Diast Vel: 2.78 msec LVOT Vmean:  59.000 cm/s LVOT VTI:    0.213 m  AORTA Ao Root diam: 3.70 cm Ao Asc diam:  3.50 cm  MITRAL VALVE MV Area (PHT): 2.12 cm    SHUNTS MV Decel Time: 357 msec    Systemic VTI:  0.21 m MV E velocity: 58.70 cm/s  Systemic Diam: 2.20  cm MV A velocity: 72.30 cm/s MV E/A ratio:  0.81  Arvilla Meres MD Electronically signed by Arvilla Meres MD Signature Date/Time: 06/01/2022/6:45:03 PM    Final              Recent Labs: 06/01/2022: ALT 13; Magnesium 2.1 06/02/2022: BUN 15; Creatinine, Ser 1.25; Hemoglobin 14.7; Platelets 216; Potassium 4.3; Sodium 139  Recent Lipid Panel    Component Value Date/Time   CHOL 197 06/02/2022 0622   CHOL 136 01/31/2021 1102   TRIG 73 06/02/2022 0622   HDL 45 06/02/2022 0622   HDL 42 01/31/2021 1102   CHOLHDL 4.4 06/02/2022 0622   VLDL 15 06/02/2022 0622   LDLCALC 137 (H) 06/02/2022 0622   LDLCALC 84 01/31/2021 1102   LDLDIRECT 59.9 06/03/2007 1114     Risk Assessment/Calculations:                Physical Exam:    VS:  BP 136/82   Pulse 96   Ht 5\' 9"  (1.753 m)   Wt 192 lb (87.1 kg)   SpO2 94%   BMI 28.35 kg/m     Wt Readings from Last 3 Encounters:  08/29/22 192 lb (87.1 kg)  08/02/22 193 lb (87.5 kg)  06/01/22 198 lb 6.6 oz (90 kg)     GEN: Well nourished, well developed in no acute distress. Sitting comfortably on the exam table  HEENT: Normal NECK: No JVD; No carotid bruits CARDIAC: RRR, no murmurs, rubs, gallops. Radial pulses 2+ bilaterally  RESPIRATORY:  Clear to auscultation without rales, wheezing or rhonchi. Normal work of breathing on room air  ABDOMEN: Soft, non-tender, non-distended MUSCULOSKELETAL:  No edema in BLE; No deformity  SKIN: Warm and dry NEUROLOGIC:  Alert and oriented x 3 PSYCHIATRIC:  Normal affect   ASSESSMENT:    1. Chronic combined systolic and diastolic CHF (congestive heart failure) (HCC)   2. Ischemic cardiomyopathy   3. LV (left ventricular) mural thrombus   4. History of TIA (transient ischemic attack)   5. CAD in native artery   6. Hyperlipidemia LDL goal <70    PLAN:    In order of problems listed above:  Chronic combined systolic and diastolic heart failure  Ischemic cardiomyopathy  - Most recent  echocardiogram from 06/01/22 showed EF 30-35% with akinesis of the left ventricular, mid apical anteroseptal wall, anterolateral wall, apical segment, inferolateral wall, inferoseptal wall and inferior wall. There was also grade I diastolic dysfunction  - Current GDMT includes entresto 97-103 mg BID, farxiga 10 mg daily, hydralazine 50 mg daily, imdur 90 mg daily, spironolactone 25 mg daily. 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 He is now retired  - Ordered CMP to monitor renal function and electrolytes on spiro, entresto   - He previously was followed by the Advanced heart failure clinic, but has not seen them in several months. Arranged an appointment in 1 month   History of LV thrombus Recent CVA - Patient had an LV thrombus in 03/2019 and was treated with eliquis - Echo from 03/2021 showed no definitive LV thrombus. However, given ongoing apical akinesis, he needs to remain on anticoagulation  - Had a CVA in 06/2022 despite compliance with eliquis, and it was thought that patient had failed Eliqius. He is now on coumadin. We originally discussed with Dr. Clifton James pre-visit whether it would be reasonable to try an alternate DOAC in case patient had in  fact missed dosing but the patient reports strict adherence to his Eliquis prior to stroke therefore will continue with plan for Coumadin - Patient has only attended one coumadin appointment since discharge, and he had to be contacted for a repeat INR. Collected 4/29 with INR 1. He reports that he has been traveling to see  sick/hospitalized family members, so he has missed his coumadin clinic appointments  - He assures me today that he plans to take his coumadin as prescribed and follow up with the coumadin clinic regularly. I did emphasize that given his history of LV thrombus, he is at a high risk of stroke, and compliance with coumadin is necessary to prevent stroke  - He has close follow up with the coumadin clinic on 5/6   CAD  - Patient previously had DES to the proximal RCA in 2004, BMS to the LAD in 2008, and DES to distal Lcx in 2018. Found to have CTO of the RCA in 2018 - Most recent cardiac catheterization from 03/2021 showed chronic occlusion of the mid RCA with good left-to-right and right-to-right collaterals. There were patents stents in the proximal LAD, distal Lcx, and proximal RCA. No new disease noted - No chest pain or dyspnea on exertion  - Continue lipitor 80 mg daily  - He has been maintained on ASA 81 mg daily  - Not on BB due to history of bradycardia    HLD  - Goal LDL <70  - Lipid panel from 06/2022 showed LDL 137, HDL 45, triglycerides 73, total cholesterol 197. LDL was previously well controlled, so question compliance with lipitor  - In 06/2022, lipitor dose was increased to 80 mg daily  - Check Lipid panel and CMP (liver function)- Patient is not fasting today, plans to come in for labs on Tuesday 5/7    Medication Adjustments/Labs and Tests Ordered: Current medicines are reviewed at length with the patient today.  Concerns regarding medicines are outlined above.  Orders Placed This Encounter  Procedures   Comp Met (CMET)   Lipid Profile   No orders of the defined types were placed in this encounter.   There are no Patient Instructions on file for this visit.   Signed, Jonita Albee, [name]  08/29/2022 10:06 AM    [facility]

## 2022-08-28 ENCOUNTER — Ambulatory Visit: Payer: Federal, State, Local not specified - PPO | Attending: Cardiovascular Disease | Admitting: *Deleted

## 2022-08-28 ENCOUNTER — Other Ambulatory Visit (HOSPITAL_COMMUNITY): Payer: Self-pay | Admitting: Family Medicine

## 2022-08-28 DIAGNOSIS — I639 Cerebral infarction, unspecified: Secondary | ICD-10-CM

## 2022-08-28 DIAGNOSIS — I513 Intracardiac thrombosis, not elsewhere classified: Secondary | ICD-10-CM

## 2022-08-28 DIAGNOSIS — Z7901 Long term (current) use of anticoagulants: Secondary | ICD-10-CM | POA: Diagnosis not present

## 2022-08-28 LAB — POCT INR: INR: 1 — AB (ref 2.0–3.0)

## 2022-08-28 NOTE — Patient Instructions (Addendum)
Description   PLEASE ADHERE TO APPOINTMENTS.  Take 1.5 tablets today and 1.5 tablets of tomorrow then START taking Warfarin 1 tablet daily EXCEPT 1.5 tablets on Mondays, Wednesdays, and Fridays.  Stay consistent with greens each week (3-4 times per week)  Recheck INR in 1 week.  Coumadin Clinic [phone]

## 2022-08-29 ENCOUNTER — Ambulatory Visit: Payer: Federal, State, Local not specified - PPO | Admitting: Cardiology

## 2022-08-29 ENCOUNTER — Encounter: Payer: Self-pay | Admitting: Cardiology

## 2022-08-29 VITALS — BP 136/82 | HR 96 | Ht 69.0 in | Wt 192.0 lb

## 2022-08-29 DIAGNOSIS — I255 Ischemic cardiomyopathy: Secondary | ICD-10-CM | POA: Diagnosis not present

## 2022-08-29 DIAGNOSIS — I513 Intracardiac thrombosis, not elsewhere classified: Secondary | ICD-10-CM | POA: Diagnosis not present

## 2022-08-29 DIAGNOSIS — E785 Hyperlipidemia, unspecified: Secondary | ICD-10-CM

## 2022-08-29 DIAGNOSIS — Z8673 Personal history of transient ischemic attack (TIA), and cerebral infarction without residual deficits: Secondary | ICD-10-CM

## 2022-08-29 DIAGNOSIS — I5042 Chronic combined systolic (congestive) and diastolic (congestive) heart failure: Secondary | ICD-10-CM

## 2022-08-29 DIAGNOSIS — I2510 Atherosclerotic heart disease of native coronary artery without angina pectoris: Secondary | ICD-10-CM

## 2022-08-29 NOTE — Patient Instructions (Addendum)
Medication Instructions:   Your physician recommends that you continue on your current medications as directed. Please refer to the Current Medication list given to you today.   *If you need a refill on your cardiac medications before your next appointment, please call your pharmacy*   Lab Work:  Your physician recommends that you return for a FASTING lipid profile/cmet on Tuesday, May 8. You can come in on the day of your appointment anytime between 7:30-4:30 fasting from midnight the night before.    If you have labs (blood work) drawn today and your tests are completely normal, you will receive your results only by: MyChart Message (if you have MyChart) OR A paper copy in the mail If you have any lab test that is abnormal or we need to change your treatment, we will call you to review the results.   Testing/Procedures:  None ordered.   Follow-Up: At [facility], you and your health needs are our priority.  As part of our continuing mission to provide you with exceptional heart care, we have created designated Provider Care Teams.  These Care Teams include your primary Cardiologist (physician) and Advanced Practice Providers (APPs -  Physician Assistants and Nurse Practitioners) who all work together to provide you with the care you need, when you need it.  We recommend signing up for the patient portal called "MyChart".  Sign up information is provided on this After Visit Summary.  MyChart is used to connect with patients for Virtual Visits (Telemedicine).  Patients are able to view lab/test results, encounter notes, upcoming appointments, etc.  Non-urgent messages can be sent to your provider as well.   To learn more about what you can do with MyChart, go to [email or web address].    Your next appointment:   3 month(s)  Provider:   Ronie Spies, [name]         Other Instructions   Parking code for [facility] 1995.

## 2022-08-30 DIAGNOSIS — F331 Major depressive disorder, recurrent, moderate: Secondary | ICD-10-CM | POA: Diagnosis not present

## 2022-08-30 DIAGNOSIS — F1020 Alcohol dependence, uncomplicated: Secondary | ICD-10-CM | POA: Diagnosis not present

## 2022-08-30 DIAGNOSIS — F1210 Cannabis abuse, uncomplicated: Secondary | ICD-10-CM | POA: Diagnosis not present

## 2022-09-04 ENCOUNTER — Ambulatory Visit: Payer: Federal, State, Local not specified - PPO

## 2022-09-05 ENCOUNTER — Ambulatory Visit: Payer: Federal, State, Local not specified - PPO | Attending: Physician Assistant

## 2022-09-05 DIAGNOSIS — I5042 Chronic combined systolic (congestive) and diastolic (congestive) heart failure: Secondary | ICD-10-CM | POA: Diagnosis not present

## 2022-09-05 DIAGNOSIS — I2510 Atherosclerotic heart disease of native coronary artery without angina pectoris: Secondary | ICD-10-CM | POA: Diagnosis not present

## 2022-09-05 DIAGNOSIS — E785 Hyperlipidemia, unspecified: Secondary | ICD-10-CM

## 2022-09-05 DIAGNOSIS — Z8673 Personal history of transient ischemic attack (TIA), and cerebral infarction without residual deficits: Secondary | ICD-10-CM

## 2022-09-05 DIAGNOSIS — I255 Ischemic cardiomyopathy: Secondary | ICD-10-CM

## 2022-09-05 DIAGNOSIS — I513 Intracardiac thrombosis, not elsewhere classified: Secondary | ICD-10-CM | POA: Diagnosis not present

## 2022-09-05 LAB — LIPID PANEL
Chol/HDL Ratio: 2.4 ratio (ref 0.0–5.0)
Cholesterol, Total: 110 mg/dL (ref 100–199)
HDL: 45 mg/dL (ref >39)
LDL Chol Calc (NIH): 52 mg/dL (ref 0–99)
Triglycerides: 59 mg/dL (ref 0–149)
VLDL Cholesterol Cal: 13 mg/dL (ref 5–40)

## 2022-09-05 LAB — COMPREHENSIVE METABOLIC PANEL
ALT: 20 IU/L (ref 0–44)
AST: 22 IU/L (ref 0–40)
Albumin/Globulin Ratio: 2 (ref 1.2–2.2)
Albumin: 4.1 g/dL (ref 3.8–4.9)
Alkaline Phosphatase: 77 IU/L (ref 44–121)
BUN/Creatinine Ratio: 12 (ref 9–20)
BUN: 15 mg/dL (ref 6–24)
Bilirubin Total: 0.3 mg/dL (ref 0.0–1.2)
CO2: 26 mmol/L (ref 20–29)
Calcium: 9 mg/dL (ref 8.7–10.2)
Chloride: 105 mmol/L (ref 96–106)
Creatinine, Ser: 1.24 mg/dL (ref 0.76–1.27)
Globulin, Total: 2.1 g/dL (ref 1.5–4.5)
Glucose: 87 mg/dL (ref 70–99)
Potassium: 3.7 mmol/L (ref 3.5–5.2)
Sodium: 142 mmol/L (ref 134–144)
Total Protein: 6.2 g/dL (ref 6.0–8.5)
eGFR: 67 mL/min/{1.73_m2} (ref >59)

## 2022-09-06 ENCOUNTER — Ambulatory Visit: Payer: Federal, State, Local not specified - PPO | Admitting: Cardiovascular Disease

## 2022-09-07 ENCOUNTER — Ambulatory Visit: Payer: Federal, State, Local not specified - PPO | Attending: Cardiology

## 2022-09-07 DIAGNOSIS — Z7901 Long term (current) use of anticoagulants: Secondary | ICD-10-CM

## 2022-09-07 DIAGNOSIS — I513 Intracardiac thrombosis, not elsewhere classified: Secondary | ICD-10-CM

## 2022-09-07 DIAGNOSIS — I639 Cerebral infarction, unspecified: Secondary | ICD-10-CM | POA: Diagnosis not present

## 2022-09-07 LAB — POCT INR: INR: 1 — AB (ref 2.0–3.0)

## 2022-09-07 NOTE — Patient Instructions (Addendum)
Description   PLEASE ADHERE TO APPOINTMENTS.  Take an extra tablet today and then START taking Warfarin 1 tablet daily EXCEPT 2 tablets on Mondays, Wednesdays, Fridays and Saturday Stay consistent with greens each week (3-4 times per week)   Recheck INR in 1 week.  Coumadin Clinic [phone]

## 2022-09-12 ENCOUNTER — Ambulatory Visit: Payer: Federal, State, Local not specified - PPO

## 2022-09-12 ENCOUNTER — Telehealth: Payer: Self-pay | Admitting: *Deleted

## 2022-09-12 NOTE — Telephone Encounter (Signed)
Called pt since he missed his appt. There was no answer and no voicemail. Will await a call back and follow up.

## 2022-09-13 ENCOUNTER — Ambulatory Visit: Payer: Federal, State, Local not specified - PPO | Attending: Cardiovascular Disease | Admitting: *Deleted

## 2022-09-13 ENCOUNTER — Other Ambulatory Visit: Payer: Self-pay | Admitting: *Deleted

## 2022-09-13 DIAGNOSIS — I513 Intracardiac thrombosis, not elsewhere classified: Secondary | ICD-10-CM

## 2022-09-13 DIAGNOSIS — Z7901 Long term (current) use of anticoagulants: Secondary | ICD-10-CM | POA: Diagnosis not present

## 2022-09-13 DIAGNOSIS — I639 Cerebral infarction, unspecified: Secondary | ICD-10-CM | POA: Diagnosis not present

## 2022-09-13 LAB — POCT INR: INR: 1 — AB (ref 2.0–3.0)

## 2022-09-13 MED ORDER — WARFARIN SODIUM 5 MG PO TABS: ORAL_TABLET | ORAL | 0 refills | Status: DC

## 2022-09-13 NOTE — Telephone Encounter (Signed)
Warfarin 5mg  refill LV Thrombus, CVA Last INR visit today Last OV 08/29/22

## 2022-09-13 NOTE — Patient Instructions (Signed)
Description   PLEASE ADHERE TO APPOINTMENTS.  Take an extra tablet today and 2.5 tablets tomorrow then START taking Warfarin 2 tablets. Stay consistent with greens each week (3-4 times per week)   Recheck INR in 1 week.  Coumadin Clinic [phone]

## 2022-09-15 ENCOUNTER — Telehealth (HOSPITAL_COMMUNITY): Payer: Self-pay | Admitting: Cardiology

## 2022-09-15 ENCOUNTER — Encounter (HOSPITAL_COMMUNITY): Payer: Self-pay

## 2022-09-15 ENCOUNTER — Ambulatory Visit (HOSPITAL_COMMUNITY)
Admission: RE | Admit: 2022-09-15 | Discharge: 2022-09-15 | Disposition: A | Discharge status: Home / Self Care | Payer: Federal, State, Local not specified - PPO | Source: Ambulatory Visit | Attending: Physician Assistant | Admitting: Physician Assistant

## 2022-09-15 VITALS — BP 180/100 | HR 52 | Wt 199.4 lb

## 2022-09-15 DIAGNOSIS — Z791 Long term (current) use of non-steroidal anti-inflammatories (NSAID): Secondary | ICD-10-CM | POA: Diagnosis not present

## 2022-09-15 DIAGNOSIS — I110 Hypertensive heart disease with heart failure: Secondary | ICD-10-CM | POA: Insufficient documentation

## 2022-09-15 DIAGNOSIS — I255 Ischemic cardiomyopathy: Secondary | ICD-10-CM | POA: Insufficient documentation

## 2022-09-15 DIAGNOSIS — Z87891 Personal history of nicotine dependence: Secondary | ICD-10-CM | POA: Diagnosis not present

## 2022-09-15 DIAGNOSIS — I2510 Atherosclerotic heart disease of native coronary artery without angina pectoris: Secondary | ICD-10-CM | POA: Diagnosis not present

## 2022-09-15 DIAGNOSIS — I513 Intracardiac thrombosis, not elsewhere classified: Secondary | ICD-10-CM

## 2022-09-15 DIAGNOSIS — Z7982 Long term (current) use of aspirin: Secondary | ICD-10-CM | POA: Diagnosis not present

## 2022-09-15 DIAGNOSIS — I5022 Chronic systolic (congestive) heart failure: Secondary | ICD-10-CM

## 2022-09-15 DIAGNOSIS — I252 Old myocardial infarction: Secondary | ICD-10-CM | POA: Insufficient documentation

## 2022-09-15 DIAGNOSIS — Z79899 Other long term (current) drug therapy: Secondary | ICD-10-CM | POA: Diagnosis not present

## 2022-09-15 DIAGNOSIS — Z7901 Long term (current) use of anticoagulants: Secondary | ICD-10-CM | POA: Diagnosis not present

## 2022-09-15 DIAGNOSIS — E785 Hyperlipidemia, unspecified: Secondary | ICD-10-CM | POA: Insufficient documentation

## 2022-09-15 DIAGNOSIS — Z7984 Long term (current) use of oral hypoglycemic drugs: Secondary | ICD-10-CM | POA: Insufficient documentation

## 2022-09-15 DIAGNOSIS — Z955 Presence of coronary angioplasty implant and graft: Secondary | ICD-10-CM | POA: Diagnosis not present

## 2022-09-15 MED ORDER — ENTRESTO 97-103 MG PO TABS: 1.0000 | ORAL_TABLET | Freq: Two times a day (BID) | ORAL | 11 refills | Status: DC

## 2022-09-15 MED ORDER — NITROGLYCERIN 0.4 MG SL SUBL: SUBLINGUAL_TABLET | SUBLINGUAL | 0 refills | Status: AC

## 2022-09-15 MED ORDER — HYDRALAZINE HCL 100 MG PO TABS: 100.0000 mg | ORAL_TABLET | Freq: Three times a day (TID) | ORAL | 11 refills | Status: DC

## 2022-09-15 MED ORDER — SPIRONOLACTONE 25 MG PO TABS: 25.0000 mg | ORAL_TABLET | Freq: Every day | ORAL | 3 refills | Status: DC

## 2022-09-15 NOTE — Patient Instructions (Addendum)
Thank you for coming in today  If you had labs drawn today, any labs that are abnormal the clinic will call you No news is good news  Medications: INCREASE Hydralazine to 100 mg 1 tablet 3 times daily   Follow up appointments:  Your physician recommends that you schedule a follow-up appointment in:  4 weeks in Pharmacy 4 months with Dr. Shirlee Latch     Do the following things EVERYDAY: Weigh yourself in the morning before breakfast. Write it down and keep it in a log. Take your medicines as prescribed Eat low salt foods--Limit salt (sodium) to 2000 mg per day.  Stay as active as you can everyday Limit all fluids for the day to less than 2 liters   At the [facility], you and your health needs are our priority. As part of our continuing mission to provide you with exceptional heart care, we have created designated Provider Care Teams. These Care Teams include your primary Cardiologist (physician) and Advanced Practice Providers (APPs- Physician Assistants and Nurse Practitioners) who all work together to provide you with the care you need, when you need it.   You may see any of the following providers on your designated Care Team at your next follow up: Dr Arvilla Meres Dr Marca Ancona Dr. Marcos Eke, NP Robbie Lis, [place] [facility] [place], [place] Brynda Peon, NP Karle Plumber, PharmD   Please be sure to bring in all your medications bottles to every appointment.    Thank you for choosing [facility]  If you have any questions or concerns before your next appointment please send [place] a message through [place] or call our office at [phone].    TO LEAVE A MESSAGE FOR THE NURSE SELECT OPTION 2, PLEASE LEAVE A MESSAGE INCLUDING: YOUR NAME DATE OF BIRTH CALL BACK NUMBER REASON FOR CALL**this is important as we prioritize the call backs  YOU WILL RECEIVE A CALL BACK THE SAME DAY AS LONG  AS YOU CALL BEFORE 4:00 PM

## 2022-09-15 NOTE — Progress Notes (Addendum)
PCP: Austin Ingram, L.[name], MD Cardiology: Dr. Clifton James HF Cardiology: Dr. Shirlee Latch  62 y.o. with history of HTN, ischemic cardiomyopathy, chronic systolic CHF, CAD, and LV thrombus.  Patient had initial PCI in 2004 to proximal RCA.  In 2008, had had anterior MI with BMS to LAD.  In 10/18, he had DES to distal LCx (RCA noted to be chronically occluded).  Echo in 4/22 showed EF 30-35%, normal RV.   He was admitted with chest pain in 11/22, this showed chronic total occlusion of RCA with collaterals, patent stents in the LAD, LCx, proximal RCA.  Echo in 11/22 showed EF 30-35%, normal RV size and systolic function, no LV thrombus.   Last seen for follow-up 04/23. Declined ICD.   Admitted 02/24 with acute stroke. CT head and MRI brain showed acute vs subacute infarct posterior limb of internal capsule. CTA head and neck with occlusion right M3 branch of insulin. Echo: EF 30-35%, apical akinesis. He reported compliance with anticoagulation. Eliquis switched to Warfarin and bridged with lovenox.   He has been stable from HF perspective. Denies CP, shortness of breath and palpitations. No orthopnea, PND or lower extremity edema. Weight has been stable, but has actually lost about 30-40 lbs over the last few years. Weighed 220-230 for years, now 190 lb at home. Walks his dogs and rides a bike without significant limitation. No dizziness or syncope. BP significantly elevated. Reports adherence with meds, has not taken any yet for today.  He has retired. He is veteran and retired Paramedic.   PMH:  1. HTN 2. Hyperlipidemia 3. H/o LV thrombus 4. CAD:  - DES to proximal RCA in 2004 - Anterior MI in 2008 with BMS to LAD.  - DES to distal LCx in 10/18, also noted to have CTO RCA.  - Cardiolite (4/22): EF 31%, peri-apical fixed defect with no ischemia.  - Echo (11/22): Chronic total occlusion of RCA with collaterals, patent stents in the LAD, LCx, proximal RCA 5. Chronic systolic CHF: Ischemic  cardiomyopathy.   - Echo (11/20): EF 40-45%, apical thrombus.  - Echo (6/21): EF 35-40%, normal RV - Echo (4/22): EF 30-35%, LAD territory WMAs, normal RV.  - Echo (11/22): EF 30-35%, normal RV size and systolic function, no LV thrombus. - Echo (02/24): EF 30-35%, LAD territory WMAs, RV okay, moderate LAE 6. CVA 02/24  SH: Married, retired from the post office, occasional marijuana, occasional ETOH, remote smoker.   Family History  Problem Relation Age of Onset   Atrial fibrillation Mother        irregular heart beats   Hypertension Mother    Diabetes type II Mother    CAD Maternal Grandfather    CAD Maternal Grandmother    ROS: All systems reviewed and negative except as per HPI.   Current Outpatient Medications  Medication Sig Dispense Refill   amLODipine (NORVASC) 10 MG tablet Take 10 mg by mouth daily.     amoxicillin-clavulanate (AUGMENTIN) 875-125 MG tablet Take 1 tablet by mouth as needed. For Dental Visits     aspirin EC 81 MG EC tablet Take 1 tablet (81 mg total) by mouth daily. Swallow whole. 30 tablet 11   atorvastatin (LIPITOR) 80 MG tablet TAKE 1 TABLET BY MOUTH EVERY DAY 90 tablet 1   azelastine (ASTELIN) 0.1 % nasal spray Place 1 spray into both nostrils as needed for allergies.     citalopram (CELEXA) 40 MG tablet Take 40 mg by mouth daily.     diclofenac Sodium (VOLTAREN)  1 % GEL Apply 1 Application topically 2 (two) times daily as needed (pain).     ENTRESTO 97-103 MG TAKE 1 TABLET BY MOUTH TWICE A DAY (Patient taking differently: Take 1 tablet by mouth 2 (two) times daily.) 180 tablet 3   etodolac (LODINE) 400 MG tablet Take 400 mg by mouth 2 (two) times daily as needed for mild pain or moderate pain.     FARXIGA 10 MG TABS tablet TAKE 1 TABLET BY MOUTH DAILY BEFORE BREAKFAST. 30 tablet 4   finasteride (PROSCAR) 5 MG tablet Take 5 mg by mouth daily.     fluticasone (FLONASE) 50 MCG/ACT nasal spray Place 1-2 sprays into both nostrils daily as needed for allergies.      hydrALAZINE (APRESOLINE) 100 MG tablet Take 1 tablet (100 mg total) by mouth 3 (three) times daily. 90 tablet 11   isosorbide mononitrate (IMDUR) 30 MG 24 hr tablet Take 1 tablet (30 mg total) by mouth daily. Taking a total of 90 mg. 90 tablet 3   isosorbide mononitrate (IMDUR) 60 MG 24 hr tablet Take 1 tablet (60 mg total) by mouth daily. Taking total of 90 mg. 90 tablet 3   tamsulosin (FLOMAX) 0.4 MG CAPS capsule Take 0.4 mg by mouth daily with breakfast.      warfarin (COUMADIN) 5 MG tablet Take 2 tablets by mouth daily or as directed by Anticoagulation Clinic. (Patient taking differently: Take 2.5 tablets by mouth daily or as directed by Anticoagulation Clinic.) 65 tablet 0   nitroGLYCERIN (NITROSTAT) 0.4 MG SL tablet Place 1 tablet under tongue as needed for chest pain. 25 tablet 0   spironolactone (ALDACTONE) 25 MG tablet Take 1 tablet (25 mg total) by mouth daily. 90 tablet 3   No current facility-administered medications for this encounter.   Facility-Administered Medications Ordered in Other Encounters  Medication Dose Route Frequency Provider Last Rate Last Admin   regadenoson (LEXISCAN) injection SOLN 0.4 mg  0.4 mg Intravenous Once Lewayne Bunting, MD       technetium tetrofosmin (TC-MYOVIEW) injection 30.9 millicurie  30.9 millicurie Intravenous Once PRN Lewayne Bunting, MD       Wt Readings from Last 3 Encounters:  09/15/22 90.4 kg (199 lb 6.4 oz)  08/29/22 87.1 kg (192 lb)  08/02/22 87.5 kg (193 lb)   BP (!) 180/100   Pulse (!) 52   Wt 90.4 kg (199 lb 6.4 oz)   SpO2 99%   BMI 29.45 kg/m  General:  Well appearing. Ambulated into clinic. HEENT: normal Neck: supple. no JVD. Carotids 2+ bilat; no bruits.  Cor: PMI nondisplaced. Regular rate & rhythm. No rubs, gallops or murmurs. Lungs: clear Abdomen: soft, nontender, nondistended.  Extremities: no cyanosis, clubbing, rash, edema Neuro: alert & orientedx3. Affect pleasant   Assessment/Plan: 1. CAD: No recent  chest pain.  Has history of anterior MI in 2008. Has CTO RCA known from 10/18 cath.  Repeat cath for chest pain in 11/22 showed chronic total occlusion of RCA with collaterals, patent stents in the LAD, LCx, proximal RCA.  No further chest pain.  - On warfarin + aspirin - Continue atorvastatin. Last LDL 137, had been 70. ? If had been taking Atorvastatin regularly. Check lipid panel next visit. 2. Chronic systolic CHF: Ischemic cardiomyopathy.  Echo in 11/22 with EF 30-35% and peri-apical wall motion abnormalities. NYHA I-II, not volume overloaded on exam.  - BP significantly elevated. Reports adherence with meds.  - Increase hydralazine to 50 mg tid. Continue Imdur  90 mg daily - Continue dapagliflozin 10 mg daily.  - Continue spironolactone 25 daily.    - Continue Entresto 97/103 mg bid.  - HR 50s, think in the future he could probably take low dose Coreg 3.125 mg bid.  - EF remains < 35%, narrow QRS.  ICD candidate but not CRT candidate. He is not interested in ICD. - Labs stable last week 3. LV thrombus: On echo in 2020 - Now on warfarin d/t recent CVA while compliant with Eliquis 4. CVA: Cardioembolic -02/24 while on Eliquis. No LV thrombus noted but anticoagulation recommended d/t EF < 35% and apical akinesis on echo. Now on Warfarin. Has missed several coumadin clinic appointments. Has been rescheduled to 05/22. May need to consider alternative anticoagulant if compliance remains an issue.  ADDENDUM After visit the patient's pharmacy updated clinic that patient had not filled Entresto since 2023. This is likely why blood pressure is elevated. He will be contacted at home to restart the medication.  Follow-up: 4 weeks with pharmD for further med titration if BP uncontrolled, Dr. Shirlee Latch in 4 months  [facility], Dalbert Garnet, [name] 09/15/2022

## 2022-09-15 NOTE — Telephone Encounter (Signed)
Per Anna Genre, PA Pt will need to restart entresto Per pharmacy last fill date 2023   Detailed message left on VM with instructions to restart Refill sent as well    -will follow up later to confirm message received

## 2022-09-18 NOTE — Telephone Encounter (Signed)
Attempted to call pt, lm refill sent in call back for questions, mychart mess sent

## 2022-09-20 ENCOUNTER — Other Ambulatory Visit (HOSPITAL_COMMUNITY): Payer: Self-pay

## 2022-09-20 ENCOUNTER — Telehealth: Payer: Self-pay

## 2022-09-20 ENCOUNTER — Ambulatory Visit: Payer: Federal, State, Local not specified - PPO

## 2022-09-20 NOTE — Telephone Encounter (Signed)
Pharmacy Patient Advocate Encounter   Received notification from [facility] that prior authorization for [facility] is needed.    PA submitted on 09/20/22 Key [id number] Status is pending  Haze Rushing, CPhT Pharmacy Patient Advocate Specialist Direct Number: [phone] Fax: [phone]

## 2022-09-21 ENCOUNTER — Ambulatory Visit: Payer: Federal, State, Local not specified - PPO | Attending: Cardiology | Admitting: *Deleted

## 2022-09-21 DIAGNOSIS — I639 Cerebral infarction, unspecified: Secondary | ICD-10-CM

## 2022-09-21 DIAGNOSIS — Z5181 Encounter for therapeutic drug level monitoring: Secondary | ICD-10-CM

## 2022-09-21 DIAGNOSIS — I513 Intracardiac thrombosis, not elsewhere classified: Secondary | ICD-10-CM

## 2022-09-21 DIAGNOSIS — Z7901 Long term (current) use of anticoagulants: Secondary | ICD-10-CM

## 2022-09-21 LAB — POCT INR: POC INR: 1.1

## 2022-09-21 NOTE — Patient Instructions (Signed)
Description   Take 3 tablets of warfarin today and tomorrow  START taking Warfarin 2 tablets daily except for 3 tablets on Sunday and Thursdays. Stay consistent with greens each week (3-4 times per week)   Recheck INR in 1 week.  Coumadin Clinic [phone]

## 2022-09-22 ENCOUNTER — Other Ambulatory Visit (HOSPITAL_COMMUNITY): Payer: Self-pay

## 2022-09-22 ENCOUNTER — Telehealth: Payer: Self-pay

## 2022-09-22 MED ORDER — EMPAGLIFLOZIN 10 MG PO TABS: 10.0000 mg | ORAL_TABLET | Freq: Every day | ORAL | 5 refills | Status: DC

## 2022-09-22 NOTE — Telephone Encounter (Signed)
Called pt to advise him that his insurance does not cover [place] anymore and instead prefers Jardiance. I have updated his med list and sent in new rx for Jardiance. Call went straight to voicemail, I have left a message. Pt will need to be advised of med change. He can also sign up for copay card if needed.

## 2022-09-22 NOTE — Telephone Encounter (Signed)
   APPROVAL DATES FOR JARDIANCE  4.24.24 TO 5.24.25

## 2022-09-22 NOTE — Telephone Encounter (Signed)
I'm okay with whatever is cheapest for him.

## 2022-09-22 NOTE — Telephone Encounter (Signed)
Pharmacy Patient Advocate Encounter  Received notification from [facility] that the request for prior authorization for FARXIGA has been denied due to [facility] PREFERRED NOT TRIED FIRST, MUST FAIL JARDIANCE  PLEASE ADVISE  Haze Rushing, CPhT Pharmacy Patient Advocate Specialist Direct Number: [phone] Fax: [phone]

## 2022-09-22 NOTE — Addendum Note (Signed)
Addended by: Tobin Witucki E on: 09/22/2022 02:24 PM   Modules accepted: Orders

## 2022-09-26 NOTE — Telephone Encounter (Signed)
I called and left detailed message per DPR about the change. Left call back number incase he had any questions.

## 2022-09-28 ENCOUNTER — Ambulatory Visit: Payer: Federal, State, Local not specified - PPO | Attending: Cardiology

## 2022-09-28 DIAGNOSIS — I513 Intracardiac thrombosis, not elsewhere classified: Secondary | ICD-10-CM | POA: Diagnosis not present

## 2022-09-28 DIAGNOSIS — Z7901 Long term (current) use of anticoagulants: Secondary | ICD-10-CM

## 2022-09-28 DIAGNOSIS — I639 Cerebral infarction, unspecified: Secondary | ICD-10-CM | POA: Diagnosis not present

## 2022-09-28 LAB — POCT INR: INR: 5.6 — AB (ref 2.0–3.0)

## 2022-09-28 NOTE — Patient Instructions (Signed)
Description   HOLD today's dose and tomorrow's dose and then continue taking Warfarin 2 tablets daily except for 3 tablets on Sunday and Thursdays.  Stay consistent with greens each week (3-4 times per week)   Recheck INR in 1 week.  Coumadin Clinic [phone]

## 2022-10-04 ENCOUNTER — Other Ambulatory Visit: Payer: Self-pay | Admitting: Cardiovascular Disease

## 2022-10-05 ENCOUNTER — Ambulatory Visit: Payer: Federal, State, Local not specified - PPO | Attending: Cardiology

## 2022-10-05 DIAGNOSIS — Z7901 Long term (current) use of anticoagulants: Secondary | ICD-10-CM | POA: Diagnosis not present

## 2022-10-05 DIAGNOSIS — I513 Intracardiac thrombosis, not elsewhere classified: Secondary | ICD-10-CM | POA: Diagnosis not present

## 2022-10-05 DIAGNOSIS — I639 Cerebral infarction, unspecified: Secondary | ICD-10-CM | POA: Diagnosis not present

## 2022-10-05 LAB — POCT INR: INR: 1.8 — AB (ref 2.0–3.0)

## 2022-10-05 NOTE — Patient Instructions (Signed)
Description   Take 3.5 tablets today and then continue taking Warfarin 2 tablets daily except for 3 tablets on Sunday and Thursdays.  Stay consistent with greens each week (3-4 times per week)   Recheck INR in 1 week.  Coumadin Clinic [phone]

## 2022-10-11 ENCOUNTER — Other Ambulatory Visit: Payer: Self-pay | Admitting: Pharmacist

## 2022-10-11 DIAGNOSIS — I513 Intracardiac thrombosis, not elsewhere classified: Secondary | ICD-10-CM

## 2022-10-11 MED ORDER — WARFARIN SODIUM 5 MG PO TABS
ORAL_TABLET | ORAL | 0 refills | Status: DC
Start: 2022-10-11 — End: 2022-10-16

## 2022-10-12 ENCOUNTER — Ambulatory Visit: Payer: Federal, State, Local not specified - PPO | Attending: Cardiovascular Disease | Admitting: *Deleted

## 2022-10-12 DIAGNOSIS — I513 Intracardiac thrombosis, not elsewhere classified: Secondary | ICD-10-CM | POA: Diagnosis not present

## 2022-10-12 DIAGNOSIS — I639 Cerebral infarction, unspecified: Secondary | ICD-10-CM

## 2022-10-12 DIAGNOSIS — Z7901 Long term (current) use of anticoagulants: Secondary | ICD-10-CM

## 2022-10-12 LAB — POCT INR: POC INR: 1.9

## 2022-10-12 NOTE — Patient Instructions (Signed)
Description   START taking warfarin 2 tablets daily except for 1 tablet on Monday, Wednesday and Fridays.  Stay consistent with greens each week (3-4 times per week)   Recheck INR in 1 week.  Coumadin Clinic [phone]

## 2022-10-15 ENCOUNTER — Other Ambulatory Visit: Payer: Self-pay | Admitting: Cardiology

## 2022-10-15 DIAGNOSIS — I513 Intracardiac thrombosis, not elsewhere classified: Secondary | ICD-10-CM

## 2022-10-16 ENCOUNTER — Inpatient Hospital Stay (HOSPITAL_COMMUNITY): Admission: RE | Admit: 2022-10-16 | Payer: Federal, State, Local not specified - PPO | Source: Ambulatory Visit

## 2022-10-16 ENCOUNTER — Telehealth (HOSPITAL_COMMUNITY): Payer: Self-pay | Admitting: Internal Medicine

## 2022-10-19 ENCOUNTER — Ambulatory Visit: Payer: Federal, State, Local not specified - PPO | Attending: Cardiology | Admitting: *Deleted

## 2022-10-19 DIAGNOSIS — F4310 Post-traumatic stress disorder, unspecified: Secondary | ICD-10-CM | POA: Diagnosis not present

## 2022-10-19 DIAGNOSIS — F329 Major depressive disorder, single episode, unspecified: Secondary | ICD-10-CM | POA: Diagnosis not present

## 2022-10-19 DIAGNOSIS — E669 Obesity, unspecified: Secondary | ICD-10-CM | POA: Diagnosis not present

## 2022-10-19 DIAGNOSIS — G894 Chronic pain syndrome: Secondary | ICD-10-CM | POA: Diagnosis not present

## 2022-10-19 DIAGNOSIS — I639 Cerebral infarction, unspecified: Secondary | ICD-10-CM

## 2022-10-19 DIAGNOSIS — Z7901 Long term (current) use of anticoagulants: Secondary | ICD-10-CM | POA: Diagnosis not present

## 2022-10-19 DIAGNOSIS — I513 Intracardiac thrombosis, not elsewhere classified: Secondary | ICD-10-CM | POA: Diagnosis not present

## 2022-10-19 DIAGNOSIS — I10 Essential (primary) hypertension: Secondary | ICD-10-CM | POA: Diagnosis not present

## 2022-10-19 LAB — POCT INR: INR: 1.3 — AB (ref 2.0–3.0)

## 2022-10-19 NOTE — Patient Instructions (Addendum)
Description   Today take 3 tablets of warfarin then START taking warfarin 2 tablets daily except for 1 tablet on Monday and Wednesday.  Stay consistent with greens each week (3-4 times per week)   Recheck INR in 1 week.  Coumadin Clinic [phone]

## 2022-10-25 DIAGNOSIS — D560 Alpha thalassemia: Secondary | ICD-10-CM | POA: Diagnosis not present

## 2022-10-25 DIAGNOSIS — M25511 Pain in right shoulder: Secondary | ICD-10-CM | POA: Diagnosis not present

## 2022-10-25 DIAGNOSIS — R7303 Prediabetes: Secondary | ICD-10-CM | POA: Diagnosis not present

## 2022-10-25 DIAGNOSIS — I10 Essential (primary) hypertension: Secondary | ICD-10-CM | POA: Diagnosis not present

## 2022-10-25 DIAGNOSIS — Z832 Family history of diseases of the blood and blood-forming organs and certain disorders involving the immune mechanism: Secondary | ICD-10-CM | POA: Diagnosis not present

## 2022-10-25 DIAGNOSIS — D509 Iron deficiency anemia, unspecified: Secondary | ICD-10-CM | POA: Diagnosis not present

## 2022-10-26 ENCOUNTER — Ambulatory Visit: Payer: Federal, State, Local not specified - PPO | Attending: Cardiovascular Disease

## 2022-10-26 DIAGNOSIS — Z7901 Long term (current) use of anticoagulants: Secondary | ICD-10-CM

## 2022-10-26 DIAGNOSIS — I513 Intracardiac thrombosis, not elsewhere classified: Secondary | ICD-10-CM | POA: Diagnosis not present

## 2022-10-26 DIAGNOSIS — I639 Cerebral infarction, unspecified: Secondary | ICD-10-CM

## 2022-10-26 LAB — POCT INR: INR: 2.1 (ref 2.0–3.0)

## 2022-10-26 NOTE — Patient Instructions (Signed)
Description   Continue taking warfarin 2 tablets daily except for 1 tablet on Monday and Wednesday.  Stay consistent with greens each week (3-4 times per week)   Recheck INR in 1 week.  Coumadin Clinic [phone]

## 2022-10-31 ENCOUNTER — Other Ambulatory Visit: Payer: Self-pay

## 2022-10-31 DIAGNOSIS — I513 Intracardiac thrombosis, not elsewhere classified: Secondary | ICD-10-CM

## 2022-10-31 MED ORDER — WARFARIN SODIUM 5 MG PO TABS
ORAL_TABLET | ORAL | 1 refills | Status: DC
Start: 2022-10-31 — End: 2022-11-16

## 2022-11-06 ENCOUNTER — Inpatient Hospital Stay (HOSPITAL_COMMUNITY): Admission: RE | Admit: 2022-11-06 | Payer: Federal, State, Local not specified - PPO | Source: Ambulatory Visit

## 2022-11-06 ENCOUNTER — Ambulatory Visit: Payer: Federal, State, Local not specified - PPO | Attending: Cardiovascular Disease | Admitting: *Deleted

## 2022-11-06 DIAGNOSIS — I513 Intracardiac thrombosis, not elsewhere classified: Secondary | ICD-10-CM

## 2022-11-06 DIAGNOSIS — I639 Cerebral infarction, unspecified: Secondary | ICD-10-CM

## 2022-11-06 DIAGNOSIS — Z7901 Long term (current) use of anticoagulants: Secondary | ICD-10-CM | POA: Diagnosis not present

## 2022-11-06 LAB — POCT INR: INR: 1.4 — AB (ref 2.0–3.0)

## 2022-11-06 NOTE — Patient Instructions (Addendum)
Description   Today take 1.5 tablets and tomorrow take 2.5 tablets then START taking warfarin 2 tablets daily except for 1 tablet on Wednesday.  Stay consistent with greens each week (3-4 times per week).  Recheck INR in 1 week.  Coumadin Clinic [phone]

## 2022-11-15 ENCOUNTER — Encounter: Payer: Self-pay | Admitting: Physician Assistant

## 2022-11-15 NOTE — Progress Notes (Addendum)
Cardiology Office Note    Date:  11/16/2022  ID:  Austin Ingram Ingram., DOB 16-Feb-1961, MRN [id number] PCP:  Austin Ingram, L.[name], MD  Cardiologist:  Austin Carrow, MD  Electrophysiologist:  Austin Prude, MD   Chief Complaint: f/u CAD, CHF  History of Present Illness: .    Austin Ingram. is a 62 y.o. male with visit-pertinent history of CAD (BMS to LAD 2008, DES to pRCA 2014, DES to dCx 01/2017 with residual disease noted below) with chronic stable angina, ischemic cardiomyopathy, chronic HFrEF, LV thrombus, possible splenic infarct 03/2021, stroke 06/2022, HTN, HLD, sinus bradycardia (not on BB due to this), CKD stage II by labs (baseline Cr 1.1-1.4), mild OSA (by sleep study 2009, reported to have improved with weight loss) who presents for follow-up.   Coronary history is outlined above, with last PCI in 2018, completed 1 year of Plavix thereafter. He has had persistently low EF over several years' time despite intensification of GDMT. He has also had LV thrombus demonstrated on imaging, previously recommended for Eliquis per Dr. Clifton James. He did not previously tolerate beta blocker therapy due to bradycardia. He had a cough with ACEi in the past. I met him in 2022 at which time we began to work on transitioning his antihypertensive regimen to a more GDMT-based regimen. He also established care with the AHF team after he was seeking improvement in his LVEF in order to qualify for DOT recertification but EF remained 30% so he did not qualify. He underwent repeat cath in 03/2021 showing chronic occlusion of the mid RCA with good left to right and right to right collaterals and patent stents, medical therapy recommended. During that admission he was not taking his carvedilol as previously prescribed and it was not restarted due to baseline bradycardia. He also had a possible splenic infarct on imaging. He was briefly lost to f/u after OV 07/2021 with CHF team, recommended to f/u in 3-4 months but  did not do so. The patient declined device therapy or ICD. He was then admitted 06/2022 with cardioembolic stroke. Repeat echo showed EF 30-35%, mildly dilated LV, G1DD, +WMA (akinesis of the left ventricular, mid-apical anteroseptal wall, anterolateral wall, apical segment, inferolateral wall, inferoseptal wall and inferior wall), no overt LV thrombus, mod LAE. He was felt to have a failure of Eliquis and was transitioned to Lovenox->Coumadin. Of note, LDL was 137 whereas this was previously controlled raising question of medication nonadherence though patient initially reported he had not been missing any of his medicines including Eliquis. Subsequent LDL in 08/2022 was 52. Initially after discharge he had missed some Coumadin clinic visits or had labile INRs. He was last seen by CHF clinic 08/2022 with high blood pressure. They noted update from pharmacy that he had not filled his Entresto since 2023. He was supposed to follow up there for additional med titration but cancelled/no-showed subsequent pharmD visits.  He returns for follow-up today overall doing well without any angina or dyspnea. He does have some days where he feels more sluggish. Some mornings he wakes up with "mucus" that he has to clear. No orthopnea, syncope, or edema reported. He has not been following his BP at home. He has not taken any of his AM meds yet. In our discussion today he does report that around the time of stroke it was possible he might have missed some of his Eliquis. Bp today running 150/86. Of note BP at recent [place] visit 09/2022 was 108/63.   Labwork independently reviewed:  08/2022 K 3.7, Cr 1.24, LFTs ok, LDL 52, trig 59 07/2022 Cr 9.56, K 4.3, UDS THC only, Hgb 14.7, plt 216, LDL 137, trig 73, Mg 2.1, LFTs ok, ETOH neg   ROS: Please see the history of present illness.  All other systems are reviewed and otherwise negative.   Studies Reviewed: Marland Kitchen    EKG:  EKG is not ordered today  CV Studies: Cardiac studies  reviewed are outlined and summarized above. Otherwise please see EMR for full report.  Physical Exam:    VS:  BP (!) 150/86   Pulse (!) 51   Ht 5\' 9"  (1.753 m)   Wt 194 lb 6.4 oz (88.2 kg)   SpO2 97%   BMI 28.71 kg/m    Wt Readings from Last 3 Encounters:  11/16/22 194 lb 6.4 oz (88.2 kg)  09/15/22 199 lb 6.4 oz (90.4 kg)  08/29/22 192 lb (87.1 kg)    GEN: Well nourished, well developed in no acute distress NECK: No JVD; No carotid bruits CARDIAC: RRR, no murmurs, rubs, gallops RESPIRATORY:  Clear to auscultation without rales, wheezing or rhonchi  ABDOMEN: Soft, non-tender, non-distended EXTREMITIES:  No edema; No acute deformity   Asessement and Plan:.    1. CAD, HLD - overall stable on present regimen. Recheck LDL 08/2022 was back down to 52. Continue ASA (recommended to continue given stroke), Imdur 90mg  daily, atorvastatin 80mg  daily. Defer BB due to baseline bradycardia as previously outlined.  2. H/o stroke, known LV thrombus - prior to recent visit I had discussed case with Dr. Clifton James as it remained questionable whether his stroke was true Eliquis failure as there have been other clinical signs of medication noadherence around that time including uncontrolled LDL (when previously controlled) as well as pharmacy notification that he had not filled Entresto since 2023. Dr. Clifton James had felt that it would be reasonable to return to Eliquis if it seemed that he was not doing well with Coumadin and had possible noncompliance with Eliquis at time of stroke. The patient reports today it's possible he had missed some doses of his Eliquis. He is also having difficulty with labile and frequently subtherapeutic INRs which poses a significant risk for recurrent embolic event. Per shared decision making, we will switch him back from Coumadin to Eliquis 5mg  BID pending stat INR level today per discussion with pharmD Melissa. Await INR level to help guide when to start first dose of Eliquis. We  went ahead and sent this into his pharmacy. He was not sure whether he had any at home still. We discussed risks/benefits. We also discussed that he should definitely take the Eliquis spaced out 12 hours apart since sometimes he takes the morning medicines around noon. (Per pharmD: if INR is <2 start Eliquis today. If it's 2-2.3 start Eliquis tomorrow AM. If its 2.4-3, start tomorrow night.)   3. Chronic HFrEF with essential HTN - BP suboptimally controlled though he reports he does not take his BP medicines yet today. Recent BP at [facility] OV was 108/64. I have recommended he follow his BP at home at least 3 hours after AM meds and reschedule close follow-up with the pharmacy med titration visit with the CHF clinic to review BP log. Would notify if readings are tending to show systolic BPs >130. Continue present regimen otherwise. Appears euvolemic. If need be in follow-up, can consider titrating spironolactone to 50mg  daily. He saw Dr. Lalla Brothers earlier this year and declined device therapy.  HYPERTENSION CONTROL Vitals:  11/16/22 0915 11/16/22 0949  BP: (!) 150/86 (!) 150/88    The patient's blood pressure is elevated above target today.  In order to address the patient's elevated BP: Blood pressure will be monitored at home to determine if medication changes need to be made.          Disposition: From gen cards standpoint, doing well so will recommended f/u with Dr. Clifton James in 6 months. Recommended he call CHF clinic to reschedule pharmD HTN med titration OV. Keep f/u with Dr. Shirlee Latch 12/2022 as scheduled.  Signed, Laurann Montana, [name]

## 2022-11-16 ENCOUNTER — Ambulatory Visit: Payer: Federal, State, Local not specified - PPO

## 2022-11-16 ENCOUNTER — Telehealth: Payer: Self-pay | Admitting: *Deleted

## 2022-11-16 ENCOUNTER — Ambulatory Visit: Payer: Federal, State, Local not specified - PPO | Admitting: Physician Assistant

## 2022-11-16 ENCOUNTER — Ambulatory Visit: Payer: Federal, State, Local not specified - PPO | Attending: Cardiovascular Disease | Admitting: Physician Assistant

## 2022-11-16 ENCOUNTER — Encounter: Payer: Self-pay | Admitting: Physician Assistant

## 2022-11-16 ENCOUNTER — Ambulatory Visit (INDEPENDENT_AMBULATORY_CARE_PROVIDER_SITE_OTHER): Payer: Self-pay | Admitting: *Deleted

## 2022-11-16 VITALS — BP 150/88 | HR 51 | Ht 69.0 in | Wt 194.4 lb

## 2022-11-16 DIAGNOSIS — E785 Hyperlipidemia, unspecified: Secondary | ICD-10-CM

## 2022-11-16 DIAGNOSIS — Z8673 Personal history of transient ischemic attack (TIA), and cerebral infarction without residual deficits: Secondary | ICD-10-CM

## 2022-11-16 DIAGNOSIS — I2510 Atherosclerotic heart disease of native coronary artery without angina pectoris: Secondary | ICD-10-CM

## 2022-11-16 DIAGNOSIS — I513 Intracardiac thrombosis, not elsewhere classified: Secondary | ICD-10-CM | POA: Diagnosis not present

## 2022-11-16 DIAGNOSIS — I5022 Chronic systolic (congestive) heart failure: Secondary | ICD-10-CM

## 2022-11-16 DIAGNOSIS — I10 Essential (primary) hypertension: Secondary | ICD-10-CM

## 2022-11-16 DIAGNOSIS — I639 Cerebral infarction, unspecified: Secondary | ICD-10-CM

## 2022-11-16 DIAGNOSIS — Z7901 Long term (current) use of anticoagulants: Secondary | ICD-10-CM

## 2022-11-16 LAB — PROTIME-INR
INR: 5.4 (ref 0.9–1.2)
Prothrombin Time: 53.3 s — ABNORMAL HIGH (ref 9.1–12.0)

## 2022-11-16 MED ORDER — APIXABAN 5 MG PO TABS: 5.0000 mg | ORAL_TABLET | Freq: Two times a day (BID) | ORAL | 3 refills | Status: AC

## 2022-11-16 NOTE — Patient Instructions (Signed)
Medication Instructions:  Your physician recommends that you continue on your current medications as directed. Please refer to the Current Medication list given to you today.  *If you need a refill on your cardiac medications before your next appointment, please call your pharmacy*   Lab Work: TODAY:  STAT INR  If you have labs (blood work) drawn today and your tests are completely normal, you will receive your results only by: MyChart Message (if you have MyChart) OR A paper copy in the mail If you have any lab test that is abnormal or we need to change your treatment, we will call you to review the results.   Testing/Procedures: None ordered   Follow-Up: At [facility], you and your health needs are our priority.  As part of our continuing mission to provide you with exceptional heart care, we have created designated Provider Care Teams.  These Care Teams include your primary Cardiologist (physician) and Advanced Practice Providers (APPs -  Physician Assistants and Nurse Practitioners) who all work together to provide you with the care you need, when you need it.  We recommend signing up for the patient portal called "MyChart".  Sign up information is provided on this After Visit Summary.  MyChart is used to connect with patients for Virtual Visits (Telemedicine).  Patients are able to view lab/test results, encounter notes, upcoming appointments, etc.  Non-urgent messages can be sent to your provider as well.   To learn more about what you can do with MyChart, go to [email or web address].    Your next appointment:   6 month(s)  Provider:   Verne Carrow, MD     Other Instructions Call the [facility] to reschedule your appointment [phone]

## 2022-11-16 NOTE — Telephone Encounter (Signed)
Placed call to pt.  Voicemail is full and couldn't leave a message.

## 2022-11-16 NOTE — Telephone Encounter (Signed)
-----   Message from Laurann Montana sent at 11/16/2022  1:27 PM EDT ----- Please translate this to phone note when you talk to patient so easier to track in chart. I spoke with pharmD regarding INR which is 5.4. Patient had no bleeding at today's visit reported. Apparently Coumadin clinic already relayed instructions based on this value. The patient did not mention to them he was switching to Eliquis. He told them he had drank more than usual which may have driven INR up. He told them that he was going to return to his normal amount.   Discussed plan with pharmD Melissa, recommendations:  - no further Coumadin for now - INR check scheduled at [facility] location on Monday 11/20/22 at 10:30 - please arrive at 10:15am - have patient make sure to pick up Eliquis 5mg  BID rx from pharmacy but do not yet start until instructed by Coumadin clinic on Monday - needs to limit alcohol and avoid NSAIDs (ibuprofen, advil, motrin, naproxen, etodolac, meloxicam) - seek care in ER for any abnormal bleeding given elevated INR

## 2022-11-16 NOTE — Patient Instructions (Signed)
Description   Spoke with pt and advised received INR results of 5.4; Advised pt not to take any warfarin tomorrow and no warfarin Saturday (already taken dose today) then resume taking warfarin 2 tablets daily except for 1 tablet on Wednesday.  Stay consistent with greens each week (3-4 times per week).  Recheck INR in 1 week in the office at [place].  Coumadin Clinic [phone]

## 2022-11-17 ENCOUNTER — Telehealth: Payer: Self-pay

## 2022-11-17 NOTE — Telephone Encounter (Signed)
-----   Message from RMA Epworth W sent at 11/17/2022  8:09 AM EDT ----- Per Ronie Spies, will route to Triage to try to get in touch with pt. ----- Message ----- From: Laurann Montana, [name] Sent: 11/16/2022   1:27 PM EDT To: Elliot Cousin, RMA  Please translate this to phone note when you talk to patient so easier to track in chart. I spoke with pharmD regarding INR which is 5.4. Patient had no bleeding at today's visit reported. Apparently Coumadin clinic already relayed instructions based on this value. The patient did not mention to them he was switching to Eliquis. He told them he had drank more than usual which may have driven INR up. He told them that he was going to return to his normal amount.   Discussed plan with pharmD Melissa, recommendations:  - no further Coumadin for now - INR check scheduled at [facility] location on Monday 11/20/22 at 10:30 - please arrive at 10:15am - have patient make sure to pick up Eliquis 5mg  BID rx from pharmacy but do not yet start until instructed by Coumadin clinic on Monday - needs to limit alcohol and avoid NSAIDs (ibuprofen, advil, motrin, naproxen, etodolac, meloxicam) - seek care in ER for any abnormal bleeding given elevated INR

## 2022-11-17 NOTE — Telephone Encounter (Signed)
Triage unable to reach patient after several attempts. Contacted wife on DPR and able to speak right away with her. She verbalized understanding of need to stop Coumadin for now and will go to appt on Monday 7/22 to have INR re-checked. She states she picks up his medications and will wait to go to pharmacy for Eliquis until after INR check on Monday. Understands that if she or he notices any signs of bleeding to go to nearest ED due to high INR level.

## 2022-11-20 ENCOUNTER — Ambulatory Visit: Payer: Federal, State, Local not specified - PPO | Admitting: *Deleted

## 2022-11-20 DIAGNOSIS — I639 Cerebral infarction, unspecified: Secondary | ICD-10-CM | POA: Diagnosis not present

## 2022-11-20 DIAGNOSIS — I513 Intracardiac thrombosis, not elsewhere classified: Secondary | ICD-10-CM | POA: Diagnosis not present

## 2022-11-20 DIAGNOSIS — Z7901 Long term (current) use of anticoagulants: Secondary | ICD-10-CM

## 2022-11-20 LAB — POCT INR: INR: 1.5 — AB (ref 2.0–3.0)

## 2022-11-20 NOTE — Patient Instructions (Signed)
Description   Stop warfarin. Start eliquis 5mg  bid today at 11am and 11pm.  Coumadin Clinic [phone]

## 2022-11-20 NOTE — Telephone Encounter (Signed)
Pt was reached and another phone encounter was opened.  Will close this as it is a duplicate.

## 2022-12-01 DIAGNOSIS — F331 Major depressive disorder, recurrent, moderate: Secondary | ICD-10-CM | POA: Diagnosis not present

## 2022-12-01 DIAGNOSIS — S6992XA Unspecified injury of left wrist, hand and finger(s), initial encounter: Secondary | ICD-10-CM | POA: Diagnosis not present

## 2022-12-01 DIAGNOSIS — S60222A Contusion of left hand, initial encounter: Secondary | ICD-10-CM | POA: Diagnosis not present

## 2022-12-08 ENCOUNTER — Other Ambulatory Visit: Payer: Self-pay | Admitting: Cardiovascular Disease

## 2022-12-08 ENCOUNTER — Other Ambulatory Visit (HOSPITAL_COMMUNITY): Payer: Self-pay | Admitting: Family Medicine

## 2022-12-11 MED ORDER — EMPAGLIFLOZIN 10 MG PO TABS: 10.0000 mg | ORAL_TABLET | Freq: Every day | ORAL | 3 refills | Status: DC

## 2022-12-13 ENCOUNTER — Other Ambulatory Visit (HOSPITAL_COMMUNITY): Payer: Self-pay | Admitting: Family Medicine

## 2022-12-13 ENCOUNTER — Other Ambulatory Visit: Payer: Self-pay | Admitting: Student

## 2022-12-13 ENCOUNTER — Telehealth: Payer: Self-pay

## 2022-12-13 ENCOUNTER — Other Ambulatory Visit: Payer: Self-pay | Admitting: Cardiovascular Disease

## 2022-12-13 ENCOUNTER — Other Ambulatory Visit (HOSPITAL_COMMUNITY): Payer: Self-pay

## 2022-12-13 NOTE — Telephone Encounter (Signed)
Pharmacy Patient Advocate Encounter   Received notification from CoverMyMeds/PT P[facility]D PHARMAC[facility] that prior authorization for JARDIANCE is required/requested.   Insurance verification completed.   The patient is insured through [employer] [facility]   Per test claim: PA required; PA submitted to [employer] [facility] via CoverMyMeds Key/confirmation #/[id number]    Status is pending

## 2022-12-14 ENCOUNTER — Other Ambulatory Visit (HOSPITAL_COMMUNITY): Payer: Self-pay

## 2022-12-14 NOTE — Telephone Encounter (Addendum)
Pharmacy Patient Advocate Encounter  Received notification from [employer] [facility] that Prior Authorization for [place]atha has been  Approved through 12/12/23   PA #/Case ID/Reference #: Key: [id number]

## 2022-12-19 DIAGNOSIS — M7741 Metatarsalgia, right foot: Secondary | ICD-10-CM | POA: Diagnosis not present

## 2022-12-19 DIAGNOSIS — M2142 Flat foot [pes planus] (acquired), left foot: Secondary | ICD-10-CM | POA: Diagnosis not present

## 2022-12-19 DIAGNOSIS — M2141 Flat foot [pes planus] (acquired), right foot: Secondary | ICD-10-CM | POA: Diagnosis not present

## 2022-12-19 DIAGNOSIS — M7742 Metatarsalgia, left foot: Secondary | ICD-10-CM | POA: Diagnosis not present

## 2022-12-21 DIAGNOSIS — R946 Abnormal results of thyroid function studies: Secondary | ICD-10-CM | POA: Diagnosis not present

## 2022-12-21 DIAGNOSIS — R5382 Chronic fatigue, unspecified: Secondary | ICD-10-CM | POA: Diagnosis not present

## 2022-12-21 DIAGNOSIS — D509 Iron deficiency anemia, unspecified: Secondary | ICD-10-CM | POA: Diagnosis not present

## 2022-12-21 DIAGNOSIS — D569 Thalassemia, unspecified: Secondary | ICD-10-CM | POA: Diagnosis not present

## 2023-01-20 ENCOUNTER — Emergency Department (HOSPITAL_COMMUNITY)
Admission: EM | Admit: 2023-01-20 | Discharge: 2023-01-20 | Disposition: A | Discharge status: Home / Self Care | Payer: Federal, State, Local not specified - PPO | Attending: Emergency Medicine | Admitting: Emergency Medicine

## 2023-01-20 ENCOUNTER — Other Ambulatory Visit: Payer: Self-pay

## 2023-01-20 ENCOUNTER — Encounter (HOSPITAL_COMMUNITY): Payer: Self-pay

## 2023-01-20 ENCOUNTER — Emergency Department (HOSPITAL_COMMUNITY): Payer: Federal, State, Local not specified - PPO

## 2023-01-20 DIAGNOSIS — I129 Hypertensive chronic kidney disease with stage 1 through stage 4 chronic kidney disease, or unspecified chronic kidney disease: Secondary | ICD-10-CM | POA: Diagnosis not present

## 2023-01-20 DIAGNOSIS — I2510 Atherosclerotic heart disease of native coronary artery without angina pectoris: Secondary | ICD-10-CM | POA: Insufficient documentation

## 2023-01-20 DIAGNOSIS — M545 Low back pain, unspecified: Secondary | ICD-10-CM | POA: Diagnosis not present

## 2023-01-20 DIAGNOSIS — Z7901 Long term (current) use of anticoagulants: Secondary | ICD-10-CM | POA: Diagnosis not present

## 2023-01-20 DIAGNOSIS — R0789 Other chest pain: Secondary | ICD-10-CM | POA: Diagnosis not present

## 2023-01-20 DIAGNOSIS — N189 Chronic kidney disease, unspecified: Secondary | ICD-10-CM | POA: Diagnosis not present

## 2023-01-20 DIAGNOSIS — S80811A Abrasion, right lower leg, initial encounter: Secondary | ICD-10-CM | POA: Insufficient documentation

## 2023-01-20 DIAGNOSIS — S0571XA Avulsion of right eye, initial encounter: Secondary | ICD-10-CM | POA: Diagnosis not present

## 2023-01-20 DIAGNOSIS — M25562 Pain in left knee: Secondary | ICD-10-CM | POA: Diagnosis not present

## 2023-01-20 DIAGNOSIS — Y92410 Unspecified street and highway as the place of occurrence of the external cause: Secondary | ICD-10-CM | POA: Diagnosis not present

## 2023-01-20 DIAGNOSIS — M542 Cervicalgia: Secondary | ICD-10-CM | POA: Diagnosis not present

## 2023-01-20 DIAGNOSIS — R519 Headache, unspecified: Secondary | ICD-10-CM | POA: Diagnosis not present

## 2023-01-20 DIAGNOSIS — I6523 Occlusion and stenosis of bilateral carotid arteries: Secondary | ICD-10-CM | POA: Diagnosis not present

## 2023-01-20 DIAGNOSIS — M1712 Unilateral primary osteoarthritis, left knee: Secondary | ICD-10-CM | POA: Diagnosis not present

## 2023-01-20 DIAGNOSIS — Z23 Encounter for immunization: Secondary | ICD-10-CM | POA: Diagnosis not present

## 2023-01-20 DIAGNOSIS — S80211A Abrasion, right knee, initial encounter: Secondary | ICD-10-CM | POA: Insufficient documentation

## 2023-01-20 DIAGNOSIS — Z7982 Long term (current) use of aspirin: Secondary | ICD-10-CM | POA: Insufficient documentation

## 2023-01-20 DIAGNOSIS — R42 Dizziness and giddiness: Secondary | ICD-10-CM | POA: Diagnosis not present

## 2023-01-20 DIAGNOSIS — S8992XA Unspecified injury of left lower leg, initial encounter: Secondary | ICD-10-CM | POA: Diagnosis not present

## 2023-01-20 DIAGNOSIS — M47816 Spondylosis without myelopathy or radiculopathy, lumbar region: Secondary | ICD-10-CM | POA: Diagnosis not present

## 2023-01-20 DIAGNOSIS — S3991XA Unspecified injury of abdomen, initial encounter: Secondary | ICD-10-CM | POA: Insufficient documentation

## 2023-01-20 DIAGNOSIS — R109 Unspecified abdominal pain: Secondary | ICD-10-CM | POA: Diagnosis not present

## 2023-01-20 DIAGNOSIS — S0990XA Unspecified injury of head, initial encounter: Secondary | ICD-10-CM | POA: Diagnosis not present

## 2023-01-20 DIAGNOSIS — S8991XA Unspecified injury of right lower leg, initial encounter: Secondary | ICD-10-CM | POA: Diagnosis not present

## 2023-01-20 DIAGNOSIS — I771 Stricture of artery: Secondary | ICD-10-CM | POA: Diagnosis not present

## 2023-01-20 DIAGNOSIS — S299XXA Unspecified injury of thorax, initial encounter: Secondary | ICD-10-CM | POA: Diagnosis not present

## 2023-01-20 DIAGNOSIS — S0591XA Unspecified injury of right eye and orbit, initial encounter: Secondary | ICD-10-CM | POA: Diagnosis not present

## 2023-01-20 DIAGNOSIS — S6992XA Unspecified injury of left wrist, hand and finger(s), initial encounter: Secondary | ICD-10-CM | POA: Diagnosis not present

## 2023-01-20 DIAGNOSIS — Z79899 Other long term (current) drug therapy: Secondary | ICD-10-CM | POA: Insufficient documentation

## 2023-01-20 DIAGNOSIS — M48061 Spinal stenosis, lumbar region without neurogenic claudication: Secondary | ICD-10-CM | POA: Diagnosis not present

## 2023-01-20 DIAGNOSIS — S3993XA Unspecified injury of pelvis, initial encounter: Secondary | ICD-10-CM | POA: Diagnosis not present

## 2023-01-20 DIAGNOSIS — M5134 Other intervertebral disc degeneration, thoracic region: Secondary | ICD-10-CM | POA: Diagnosis not present

## 2023-01-20 DIAGNOSIS — S4992XA Unspecified injury of left shoulder and upper arm, initial encounter: Secondary | ICD-10-CM | POA: Diagnosis not present

## 2023-01-20 DIAGNOSIS — M19042 Primary osteoarthritis, left hand: Secondary | ICD-10-CM | POA: Diagnosis not present

## 2023-01-20 DIAGNOSIS — M25561 Pain in right knee: Secondary | ICD-10-CM | POA: Diagnosis not present

## 2023-01-20 LAB — I-STAT CHEM 8, ED
BUN: 17 mg/dL (ref 6–20)
Calcium, Ion: 0.88 mmol/L — CL (ref 1.15–1.40)
Chloride: 118 mmol/L — ABNORMAL HIGH (ref 98–111)
Creatinine, Ser: 0.9 mg/dL (ref 0.61–1.24)
Glucose, Bld: 69 mg/dL — ABNORMAL LOW (ref 70–99)
HCT: 29 % — ABNORMAL LOW (ref 39.0–52.0)
Hemoglobin: 9.9 g/dL — ABNORMAL LOW (ref 13.0–17.0)
Potassium: 3.6 mmol/L (ref 3.5–5.1)
Sodium: 150 mmol/L — ABNORMAL HIGH (ref 135–145)
TCO2: 19 mmol/L — ABNORMAL LOW (ref 22–32)

## 2023-01-20 LAB — PROTIME-INR
INR: 1.1 (ref 0.8–1.2)
Prothrombin Time: 14.3 seconds (ref 11.4–15.2)

## 2023-01-20 LAB — SAMPLE TO BLOOD BANK

## 2023-01-20 LAB — COMPREHENSIVE METABOLIC PANEL
ALT: 20 U/L (ref 0–44)
AST: 33 U/L (ref 15–41)
Albumin: 3.7 g/dL (ref 3.5–5.0)
Alkaline Phosphatase: 70 U/L (ref 38–126)
Anion gap: 12 (ref 5–15)
BUN: 17 mg/dL (ref 6–20)
CO2: 23 mmol/L (ref 22–32)
Calcium: 8.8 mg/dL — ABNORMAL LOW (ref 8.9–10.3)
Chloride: 108 mmol/L (ref 98–111)
Creatinine, Ser: 1.41 mg/dL — ABNORMAL HIGH (ref 0.61–1.24)
GFR, Estimated: 57 mL/min — ABNORMAL LOW (ref >60)
Glucose, Bld: 108 mg/dL — ABNORMAL HIGH (ref 70–99)
Potassium: 3.4 mmol/L — ABNORMAL LOW (ref 3.5–5.1)
Sodium: 143 mmol/L (ref 135–145)
Total Bilirubin: 0.9 mg/dL (ref 0.3–1.2)
Total Protein: 6.5 g/dL (ref 6.5–8.1)

## 2023-01-20 LAB — CBC
HCT: 39.9 % (ref 39.0–52.0)
Hemoglobin: 12.2 g/dL — ABNORMAL LOW (ref 13.0–17.0)
MCH: 23.3 pg — ABNORMAL LOW (ref 26.0–34.0)
MCHC: 30.6 g/dL (ref 30.0–36.0)
MCV: 76.3 fL — ABNORMAL LOW (ref 80.0–100.0)
Platelets: 214 10*3/uL (ref 150–400)
RBC: 5.23 MIL/uL (ref 4.22–5.81)
RDW: 16.1 % — ABNORMAL HIGH (ref 11.5–15.5)
WBC: 11.6 10*3/uL — ABNORMAL HIGH (ref 4.0–10.5)
nRBC: 0 % (ref 0.0–0.2)

## 2023-01-20 MED ORDER — HYDROMORPHONE HCL 1 MG/ML IJ SOLN
1.0000 mg | Freq: Once | INTRAMUSCULAR | Status: AC
  Administered 2023-01-20: 1 mg via INTRAVENOUS
  Filled 2023-01-20: qty 1

## 2023-01-20 MED ORDER — IOHEXOL 350 MG/ML SOLN
75.0000 mL | Freq: Once | INTRAVENOUS | Status: AC | PRN
  Administered 2023-01-20: 75 mL via INTRAVENOUS

## 2023-01-20 MED ORDER — HYDROCODONE-ACETAMINOPHEN 5-325 MG PO TABS
2.0000 | ORAL_TABLET | Freq: Four times a day (QID) | ORAL | 0 refills | Status: AC | PRN
Start: 2023-01-20 — End: ?

## 2023-01-20 MED ORDER — TETANUS-DIPHTH-ACELL PERTUSSIS 5-2.5-18.5 LF-MCG/0.5 IM SUSY
0.5000 mL | PREFILLED_SYRINGE | Freq: Once | INTRAMUSCULAR | Status: AC
  Administered 2023-01-20: 0.5 mL via INTRAMUSCULAR
  Filled 2023-01-20: qty 0.5

## 2023-01-20 MED ORDER — ONDANSETRON HCL 4 MG/2ML IJ SOLN
4.0000 mg | Freq: Once | INTRAMUSCULAR | Status: AC
  Administered 2023-01-20: 4 mg via INTRAVENOUS
  Filled 2023-01-20: qty 2
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
## 2023-01-20 NOTE — ED Notes (Signed)
Pt able to ambulate around his bed. Pt was feeling light-headed. Pt had recently gotten IV dilaudid. MD aware.
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
## 2023-01-22 ENCOUNTER — Encounter (HOSPITAL_COMMUNITY): Payer: Federal, State, Local not specified - PPO | Admitting: Cardiology

## 2023-01-23 ENCOUNTER — Other Ambulatory Visit (HOSPITAL_COMMUNITY): Payer: Self-pay | Admitting: Family Medicine

## 2023-01-23 ENCOUNTER — Other Ambulatory Visit: Payer: Self-pay | Admitting: Cardiovascular Disease

## 2023-01-23 ENCOUNTER — Other Ambulatory Visit: Payer: Self-pay | Admitting: Student

## 2023-01-24 MED ORDER — EMPAGLIFLOZIN 10 MG PO TABS: 10.0000 mg | ORAL_TABLET | Freq: Every day | ORAL | 2 refills | Status: DC

## 2023-01-24 MED ORDER — ATORVASTATIN CALCIUM 80 MG PO TABS: 80.0000 mg | ORAL_TABLET | Freq: Every day | ORAL | 1 refills | Status: AC

## 2023-01-26 ENCOUNTER — Emergency Department (HOSPITAL_COMMUNITY): Payer: Federal, State, Local not specified - PPO

## 2023-01-26 ENCOUNTER — Observation Stay (HOSPITAL_COMMUNITY)
Admission: EM | Admit: 2023-01-26 | Discharge: 2023-01-29 | Disposition: A | Discharge status: Home / Self Care | Payer: Federal, State, Local not specified - PPO | Attending: General Surgery | Admitting: General Surgery

## 2023-01-26 ENCOUNTER — Other Ambulatory Visit: Payer: Self-pay

## 2023-01-26 ENCOUNTER — Encounter (HOSPITAL_COMMUNITY): Payer: Self-pay | Admitting: Emergency Medicine

## 2023-01-26 ENCOUNTER — Emergency Department (HOSPITAL_BASED_OUTPATIENT_CLINIC_OR_DEPARTMENT_OTHER): Payer: Federal, State, Local not specified - PPO

## 2023-01-26 DIAGNOSIS — S12100A Unspecified displaced fracture of second cervical vertebra, initial encounter for closed fracture: Secondary | ICD-10-CM | POA: Diagnosis not present

## 2023-01-26 DIAGNOSIS — S92411A Displaced fracture of proximal phalanx of right great toe, initial encounter for closed fracture: Secondary | ICD-10-CM | POA: Diagnosis not present

## 2023-01-26 DIAGNOSIS — M79661 Pain in right lower leg: Secondary | ICD-10-CM | POA: Diagnosis not present

## 2023-01-26 DIAGNOSIS — S82209A Unspecified fracture of shaft of unspecified tibia, initial encounter for closed fracture: Principal | ICD-10-CM | POA: Diagnosis present

## 2023-01-26 DIAGNOSIS — M25562 Pain in left knee: Secondary | ICD-10-CM | POA: Diagnosis not present

## 2023-01-26 DIAGNOSIS — I2510 Atherosclerotic heart disease of native coronary artery without angina pectoris: Secondary | ICD-10-CM | POA: Insufficient documentation

## 2023-01-26 DIAGNOSIS — Z7984 Long term (current) use of oral hypoglycemic drugs: Secondary | ICD-10-CM | POA: Diagnosis not present

## 2023-01-26 DIAGNOSIS — D6489 Other specified anemias: Secondary | ICD-10-CM | POA: Diagnosis not present

## 2023-01-26 DIAGNOSIS — M7989 Other specified soft tissue disorders: Secondary | ICD-10-CM | POA: Diagnosis not present

## 2023-01-26 DIAGNOSIS — S82454A Nondisplaced comminuted fracture of shaft of right fibula, initial encounter for closed fracture: Secondary | ICD-10-CM | POA: Diagnosis not present

## 2023-01-26 DIAGNOSIS — Z87891 Personal history of nicotine dependence: Secondary | ICD-10-CM | POA: Insufficient documentation

## 2023-01-26 DIAGNOSIS — Z8673 Personal history of transient ischemic attack (TIA), and cerebral infarction without residual deficits: Secondary | ICD-10-CM | POA: Diagnosis not present

## 2023-01-26 DIAGNOSIS — I5042 Chronic combined systolic (congestive) and diastolic (congestive) heart failure: Secondary | ICD-10-CM | POA: Diagnosis not present

## 2023-01-26 DIAGNOSIS — S82201A Unspecified fracture of shaft of right tibia, initial encounter for closed fracture: Secondary | ICD-10-CM | POA: Diagnosis not present

## 2023-01-26 DIAGNOSIS — M79604 Pain in right leg: Secondary | ICD-10-CM | POA: Diagnosis not present

## 2023-01-26 DIAGNOSIS — S299XXA Unspecified injury of thorax, initial encounter: Secondary | ICD-10-CM | POA: Diagnosis not present

## 2023-01-26 DIAGNOSIS — S82431A Displaced oblique fracture of shaft of right fibula, initial encounter for closed fracture: Secondary | ICD-10-CM | POA: Diagnosis not present

## 2023-01-26 DIAGNOSIS — D62 Acute posthemorrhagic anemia: Secondary | ICD-10-CM | POA: Diagnosis not present

## 2023-01-26 DIAGNOSIS — Z7901 Long term (current) use of anticoagulants: Secondary | ICD-10-CM | POA: Diagnosis not present

## 2023-01-26 DIAGNOSIS — S82141A Displaced bicondylar fracture of right tibia, initial encounter for closed fracture: Secondary | ICD-10-CM | POA: Diagnosis not present

## 2023-01-26 DIAGNOSIS — S3993XA Unspecified injury of pelvis, initial encounter: Secondary | ICD-10-CM | POA: Diagnosis not present

## 2023-01-26 DIAGNOSIS — S2231XA Fracture of one rib, right side, initial encounter for closed fracture: Secondary | ICD-10-CM | POA: Insufficient documentation

## 2023-01-26 DIAGNOSIS — Z955 Presence of coronary angioplasty implant and graft: Secondary | ICD-10-CM | POA: Diagnosis not present

## 2023-01-26 DIAGNOSIS — S301XXA Contusion of abdominal wall, initial encounter: Secondary | ICD-10-CM | POA: Diagnosis not present

## 2023-01-26 DIAGNOSIS — J45909 Unspecified asthma, uncomplicated: Secondary | ICD-10-CM | POA: Diagnosis not present

## 2023-01-26 DIAGNOSIS — S82401A Unspecified fracture of shaft of right fibula, initial encounter for closed fracture: Secondary | ICD-10-CM | POA: Diagnosis not present

## 2023-01-26 DIAGNOSIS — S12200A Unspecified displaced fracture of third cervical vertebra, initial encounter for closed fracture: Secondary | ICD-10-CM | POA: Diagnosis not present

## 2023-01-26 DIAGNOSIS — R600 Localized edema: Secondary | ICD-10-CM | POA: Diagnosis not present

## 2023-01-26 DIAGNOSIS — M25461 Effusion, right knee: Secondary | ICD-10-CM | POA: Diagnosis not present

## 2023-01-26 DIAGNOSIS — Z7982 Long term (current) use of aspirin: Secondary | ICD-10-CM | POA: Diagnosis not present

## 2023-01-26 DIAGNOSIS — N182 Chronic kidney disease, stage 2 (mild): Secondary | ICD-10-CM | POA: Diagnosis not present

## 2023-01-26 DIAGNOSIS — I130 Hypertensive heart and chronic kidney disease with heart failure and stage 1 through stage 4 chronic kidney disease, or unspecified chronic kidney disease: Secondary | ICD-10-CM | POA: Diagnosis not present

## 2023-01-26 DIAGNOSIS — S82491A Other fracture of shaft of right fibula, initial encounter for closed fracture: Secondary | ICD-10-CM | POA: Diagnosis not present

## 2023-01-26 DIAGNOSIS — S3991XA Unspecified injury of abdomen, initial encounter: Secondary | ICD-10-CM | POA: Diagnosis not present

## 2023-01-26 DIAGNOSIS — S82124A Nondisplaced fracture of lateral condyle of right tibia, initial encounter for closed fracture: Secondary | ICD-10-CM

## 2023-01-26 DIAGNOSIS — S82291A Other fracture of shaft of right tibia, initial encounter for closed fracture: Secondary | ICD-10-CM | POA: Diagnosis not present

## 2023-01-26 DIAGNOSIS — R109 Unspecified abdominal pain: Secondary | ICD-10-CM | POA: Diagnosis not present

## 2023-01-26 LAB — BASIC METABOLIC PANEL
Anion gap: 9 (ref 5–15)
BUN: 14 mg/dL (ref 6–20)
CO2: 25 mmol/L (ref 22–32)
Calcium: 8.5 mg/dL — ABNORMAL LOW (ref 8.9–10.3)
Chloride: 106 mmol/L (ref 98–111)
Creatinine, Ser: 1.13 mg/dL (ref 0.61–1.24)
GFR, Estimated: >60 mL/min (ref >60)
Glucose, Bld: 126 mg/dL — ABNORMAL HIGH (ref 70–99)
Potassium: 3 mmol/L — ABNORMAL LOW (ref 3.5–5.1)
Sodium: 140 mmol/L (ref 135–145)

## 2023-01-26 LAB — CBC WITH DIFFERENTIAL/PLATELET
Abs Immature Granulocytes: 0.05 10*3/uL (ref 0.00–0.07)
Basophils Absolute: 0 10*3/uL (ref 0.0–0.1)
Basophils Relative: 0 %
Eosinophils Absolute: 0.2 10*3/uL (ref 0.0–0.5)
Eosinophils Relative: 3 %
HCT: 24.7 % — ABNORMAL LOW (ref 39.0–52.0)
Hemoglobin: 7.5 g/dL — ABNORMAL LOW (ref 13.0–17.0)
Immature Granulocytes: 1 %
Lymphocytes Relative: 18 %
Lymphs Abs: 1.4 10*3/uL (ref 0.7–4.0)
MCH: 24 pg — ABNORMAL LOW (ref 26.0–34.0)
MCHC: 30.4 g/dL (ref 30.0–36.0)
MCV: 79.2 fL — ABNORMAL LOW (ref 80.0–100.0)
Monocytes Absolute: 0.5 10*3/uL (ref 0.1–1.0)
Monocytes Relative: 7 %
Neutro Abs: 5.2 10*3/uL (ref 1.7–7.7)
Neutrophils Relative %: 71 %
Platelets: 261 10*3/uL (ref 150–400)
RBC: 3.12 MIL/uL — ABNORMAL LOW (ref 4.22–5.81)
RDW: 15.9 % — ABNORMAL HIGH (ref 11.5–15.5)
WBC: 7.4 10*3/uL (ref 4.0–10.5)
nRBC: 1.9 % — ABNORMAL HIGH (ref 0.0–0.2)

## 2023-01-26 LAB — CK: Total CK: 939 U/L — ABNORMAL HIGH (ref 49–397)

## 2023-01-26 LAB — LACTIC ACID, PLASMA
Lactic Acid, Venous: 3.3 mmol/L (ref 0.5–1.9)
Lactic Acid, Venous: 1.2 mmol/L (ref 0.5–1.9)

## 2023-01-26 MED ORDER — METHOCARBAMOL 1000 MG/10ML IJ SOLN
500.0000 mg | Freq: Three times a day (TID) | INTRAVENOUS | Status: DC
  Filled 2023-01-26: qty 5

## 2023-01-26 MED ORDER — SPIRONOLACTONE 25 MG PO TABS
25.0000 mg | ORAL_TABLET | Freq: Every day | ORAL | Status: DC
  Administered 2023-01-28 – 2023-01-29 (×2): 25 mg via ORAL
  Filled 2023-01-26 (×4): qty 1

## 2023-01-26 MED ORDER — IOHEXOL 350 MG/ML SOLN
75.0000 mL | Freq: Once | INTRAVENOUS | Status: AC | PRN
  Administered 2023-01-26: 75 mL via INTRAVENOUS

## 2023-01-26 MED ORDER — ONDANSETRON 4 MG PO TBDP: 4.0000 mg | ORAL_TABLET | Freq: Four times a day (QID) | ORAL | Status: DC | PRN

## 2023-01-26 MED ORDER — MELATONIN 3 MG PO TABS: 3.0000 mg | ORAL_TABLET | Freq: Every evening | ORAL | Status: DC | PRN

## 2023-01-26 MED ORDER — ACETAMINOPHEN 500 MG PO TABS
1000.0000 mg | ORAL_TABLET | Freq: Four times a day (QID) | ORAL | Status: DC
  Administered 2023-01-27 – 2023-01-29 (×5): 1000 mg via ORAL
  Filled 2023-01-26 (×6): qty 2

## 2023-01-26 MED ORDER — HYDROMORPHONE HCL 1 MG/ML IJ SOLN: 0.5000 mg | INTRAMUSCULAR | Status: DC | PRN

## 2023-01-26 MED ORDER — ISOSORBIDE MONONITRATE ER 60 MG PO TB24
90.0000 mg | ORAL_TABLET | Freq: Every day | ORAL | Status: DC
  Administered 2023-01-28 – 2023-01-29 (×2): 90 mg via ORAL
  Filled 2023-01-26 (×2): qty 1

## 2023-01-26 MED ORDER — CITALOPRAM HYDROBROMIDE 20 MG PO TABS
40.0000 mg | ORAL_TABLET | Freq: Every day | ORAL | Status: DC
  Administered 2023-01-28 – 2023-01-29 (×2): 40 mg via ORAL
  Filled 2023-01-26 (×3): qty 2

## 2023-01-26 MED ORDER — FENTANYL CITRATE PF 50 MCG/ML IJ SOSY
50.0000 ug | PREFILLED_SYRINGE | Freq: Once | INTRAMUSCULAR | Status: AC
  Administered 2023-01-26: 50 ug via INTRAVENOUS
  Filled 2023-01-26: qty 1

## 2023-01-26 MED ORDER — ONDANSETRON HCL 4 MG/2ML IJ SOLN
4.0000 mg | Freq: Once | INTRAMUSCULAR | Status: AC
  Administered 2023-01-26: 4 mg via INTRAVENOUS
  Filled 2023-01-26: qty 2

## 2023-01-26 MED ORDER — SODIUM CHLORIDE 0.9 % IV BOLUS
1000.0000 mL | Freq: Once | INTRAVENOUS | Status: AC
  Administered 2023-01-26: 1000 mL via INTRAVENOUS

## 2023-01-26 MED ORDER — METOPROLOL TARTRATE 5 MG/5ML IV SOLN: 5.0000 mg | Freq: Four times a day (QID) | INTRAVENOUS | Status: DC | PRN

## 2023-01-26 MED ORDER — ATORVASTATIN CALCIUM 80 MG PO TABS
80.0000 mg | ORAL_TABLET | Freq: Every day | ORAL | Status: DC
  Administered 2023-01-28 – 2023-01-29 (×2): 80 mg via ORAL
  Filled 2023-01-26 (×3): qty 1

## 2023-01-26 MED ORDER — INSULIN ASPART 100 UNIT/ML IJ SOLN: 0.0000 [IU] | Freq: Three times a day (TID) | INTRAMUSCULAR | Status: DC

## 2023-01-26 MED ORDER — HYDRALAZINE HCL 50 MG PO TABS
100.0000 mg | ORAL_TABLET | Freq: Three times a day (TID) | ORAL | Status: DC
  Administered 2023-01-27 – 2023-01-29 (×6): 100 mg via ORAL
  Filled 2023-01-26 (×7): qty 2

## 2023-01-26 MED ORDER — DOCUSATE SODIUM 100 MG PO CAPS
100.0000 mg | ORAL_CAPSULE | Freq: Two times a day (BID) | ORAL | Status: DC
  Administered 2023-01-27 – 2023-01-29 (×3): 100 mg via ORAL
  Filled 2023-01-26 (×4): qty 1

## 2023-01-26 MED ORDER — HYDROMORPHONE HCL 1 MG/ML IJ SOLN
0.5000 mg | Freq: Once | INTRAMUSCULAR | Status: AC
  Administered 2023-01-26: 0.5 mg via INTRAVENOUS
  Filled 2023-01-26: qty 1

## 2023-01-26 MED ORDER — OXYCODONE HCL 5 MG PO TABS
5.0000 mg | ORAL_TABLET | ORAL | Status: DC | PRN
  Administered 2023-01-27 (×3): 10 mg via ORAL
  Filled 2023-01-26 (×4): qty 2

## 2023-01-26 MED ORDER — AMLODIPINE BESYLATE 10 MG PO TABS
10.0000 mg | ORAL_TABLET | Freq: Every day | ORAL | Status: DC
  Administered 2023-01-28 – 2023-01-29 (×2): 10 mg via ORAL
  Filled 2023-01-26 (×3): qty 1

## 2023-01-26 MED ORDER — METHOCARBAMOL 500 MG PO TABS
500.0000 mg | ORAL_TABLET | Freq: Three times a day (TID) | ORAL | Status: DC
  Administered 2023-01-27 (×2): 500 mg via ORAL
  Filled 2023-01-26 (×3): qty 1

## 2023-01-26 MED ORDER — TAMSULOSIN HCL 0.4 MG PO CAPS
0.4000 mg | ORAL_CAPSULE | Freq: Every day | ORAL | Status: DC
  Administered 2023-01-28 – 2023-01-29 (×2): 0.4 mg via ORAL
  Filled 2023-01-26 (×3): qty 1

## 2023-01-26 MED ORDER — POLYETHYLENE GLYCOL 3350 17 G PO PACK: 17.0000 g | PACK | Freq: Every day | ORAL | Status: DC | PRN

## 2023-01-26 MED ORDER — ASPIRIN 81 MG PO TBEC
81.0000 mg | DELAYED_RELEASE_TABLET | Freq: Every day | ORAL | Status: DC
  Filled 2023-01-26: qty 1

## 2023-01-26 MED ORDER — ONDANSETRON HCL 4 MG/2ML IJ SOLN
4.0000 mg | Freq: Four times a day (QID) | INTRAMUSCULAR | Status: DC | PRN
  Administered 2023-01-27: 4 mg via INTRAVENOUS
  Filled 2023-01-26 (×2): qty 2

## 2023-01-26 MED ORDER — HYDRALAZINE HCL 20 MG/ML IJ SOLN: 10.0000 mg | INTRAMUSCULAR | Status: DC | PRN

## 2023-01-26 MED ORDER — ENOXAPARIN SODIUM 30 MG/0.3ML IJ SOSY: 30.0000 mg | PREFILLED_SYRINGE | Freq: Two times a day (BID) | INTRAMUSCULAR | Status: DC

## 2023-01-26 NOTE — Progress Notes (Signed)
Lower extremity venous duplex completed. Please see CV Procedures for preliminary results.  Shona Simpson, RVT 01/26/23 4:18 PM
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
## 2023-01-26 NOTE — ED Notes (Signed)
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 mononitrate (IMDUR) 24 hr tablet 90 mg (has no administration in time range)  tamsulosin (FLOMAX) capsule 0.4 mg (has no administration in time range)  spironolactone (ALDACTONE) tablet 25 mg (has no administration in time range)  fentaNYL (SUBLIMAZE) injection 50 mcg (50 mcg Intravenous Given 01/26/23 1255)  ondansetron (ZOFRAN) injection 4 mg (4 mg Intravenous Given 01/26/23 1255)  sodium chloride 0.9 % bolus 1,000 mL (0 mLs Intravenous Stopped 01/26/23 1602)  HYDROmorphone (DILAUDID) injection 0.5 mg (0.5 mg Intravenous Given 01/26/23 1509)  iohexol (OMNIPAQUE) 350 MG/ML injection 75 mL (75 mLs Intravenous Contrast Given 01/26/23 2014)  HYDROmorphone (DILAUDID) injection 0.5 mg (0.5 mg Intravenous Given 01/26/23 2228)    Mobility walks     Focused Assessments Cardiac Assessment Handoff:    Lab Results  Component Value Date   CKTOTAL 939 (H) 01/26/2023   CKMB 6.9 (H) 03/28/2007   TROPONINI 0.04 (HH) 03/07/2017   No results  found for: "DDIMER" Does the Patient currently have chest pain? No    R Recommendations: See Admitting Provider Note  Report given to:   Additional Notes:

## 2023-01-26 NOTE — H&P (Signed)
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 ANGIOGRAM N/A 10/04/2012   Procedure: LEFT HEART CATHETERIZATION WITH CORONARY ANGIOGRAM;  Surgeon: Tonny Bollman, MD;  Location: [facility] CATH LAB;  Service: Cardiovascular;  Laterality: N/A;    Social History:  reports that he quit smoking about 24 years ago. His smoking use included cigarettes. He started smoking about 44 years ago. He has a 10 pack-year smoking history. He has never used smokeless tobacco. He reports current alcohol use. He reports current drug use. Frequency: 14.00 times per week. Drug: Marijuana.  Allergies:  Allergies  Allergen Reactions   Adhesive [Tape] Other (See Comments)    SKIN SCARRING/IRRITATION. PAPER TAPE IS OKAY    (Not in a hospital admission)    Physical Exam: Blood pressure 114/61, pulse 75, temperature 98.4 F (36.9 C), temperature source Oral, resp. rate 17, height 5\' 9"  (1.753 m), weight 88.5 kg, SpO2 100%. ***  Results for orders placed or performed during the hospital encounter of 01/26/23 (from the past 48 hour(s))  Basic metabolic panel     Status: Abnormal
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
## 2023-01-26 NOTE — ED Notes (Signed)
Patient transported to Ultrasound 

## 2023-01-26 NOTE — ED Notes (Signed)
Pt off the floor getting a scan. Will get temp when they get back.

## 2023-01-26 NOTE — H&P (Incomplete)
Austin Soley Jr. [birth year]  [id number].     HPI:  Austin Ingram is a 62 yo male who was recently in a   ROS: ROS  Family History  Problem Relation Age of Onset  . Atrial fibrillation Mother        irregular heart beats  . Hypertension Mother   . Diabetes type II Mother   . CAD Maternal Grandfather   . CAD Maternal Grandmother     Past Medical History:  Diagnosis Date  . Allergic rhinitis   . Anxiety   . Arthritis    "lower back, right thumb, knees" (10/04/2012)  . Asthma    "grew out of it" (10/04/2012)  . CKD (chronic kidney disease), stage II   . Coronary artery disease    a. s/p prior stent to LAD;  b. LHC 6/14: DES to pRCA. c. DES to dCx 01/2017 with residual disease.  . Depression   . Hyperlipidemia   . Hypertension   . Ischemic cardiomyopathy   . LV (left ventricular) mural thrombus   . MI (myocardial infarction) (HCC) 03/2007  . OSA (obstructive sleep apnea)    mild  . Pneumonia    "as a child" (10/04/2012)  . Sinus bradycardia   . Splenic infarct     Past Surgical History:  Procedure Laterality Date  . ARTHROPLASTY Right 1993   'crushed; removed bone fragments" (10/04/2012)  . CARDIAC CATHETERIZATION  2010  . CORONARY ANGIOPLASTY WITH STENT PLACEMENT  2008; 10/04/2012   "1 + 1" (10/04/2012)  . CORONARY PRESSURE/FFR STUDY N/A 02/26/2017   Procedure: INTRAVASCULAR PRESSURE WIRE/FFR STUDY;  Surgeon: Kathleene Hazel, MD;  Location: [facility];  Service: Cardiovascular;  Laterality: N/A;  . CORONARY STENT INTERVENTION N/A 02/26/2017   Procedure: CORONARY STENT INTERVENTION;  Surgeon: Kathleene Hazel, MD;  Location: [facility];  Service: Cardiovascular;  Laterality: N/A;  . EXPLORATORY LAPAROTOMY  1990's?   "went in to repair hernia; found fatty tissue instead; no hernia repair" (10/04/2012)  . LEFT HEART CATH AND CORONARY ANGIOGRAPHY N/A 02/26/2017   Procedure: LEFT HEART CATH AND CORONARY ANGIOGRAPHY;  Surgeon: Kathleene Hazel,  MD;  Location: [facility];  Service: Cardiovascular;  Laterality: N/A;  . LEFT HEART CATH AND CORONARY ANGIOGRAPHY N/A 03/29/2021   Procedure: LEFT HEART CATH AND CORONARY ANGIOGRAPHY;  Surgeon: Sw[place]il[place], Peter M, MD;  Location: [facility] INVASIVE CV LAB;  Service: Cardiovascular;  Laterality: N/A;  . LEFT HEART CATHETERIZATION WITH CORONARY ANGIOGRAM N/A 10/04/2012   Procedure: LEFT HEART CATHETERIZATION WITH CORONARY ANGIOGRAM;  Surgeon: Tonny Bollman, MD;  Location: [facility] CATH LAB;  Service: Cardiovascular;  Laterality: N/A;    Social History:  reports that he quit smoking about 24 years ago. His smoking use included cigarettes. He started smoking about 44 years ago. He has a 10 pack-year smoking history. He has never used smokeless tobacco. He reports current alcohol use. He reports current drug use. Frequency: 14.00 times per week. Drug: Marijuana.  Allergies:  Allergies  Allergen Reactions  . Adhesive [Tape] Other (See Comments)    SKIN SCARRING/IRRITATION. PAPER TAPE IS OKAY    (Not in a hospital admission)    Physical Exam: Blood pressure 114/61, pulse 75, temperature 98.4 F (36.9 C), temperature source Oral, resp. rate 17, height 5\' 9"  (1.753 m), weight 88.5 kg, SpO2 100%. ***  Results for orders placed or performed during the hospital encounter of 01/26/23 (from the past 48 hour(s))  Basic metabolic panel  Austin Soley Jr. [birth year]  [id number].     HPI:  Austin Ingram is a 62 yo male who was recently in a   ROS: ROS  Family History  Problem Relation Age of Onset  . Atrial fibrillation Mother        irregular heart beats  . Hypertension Mother   . Diabetes type II Mother   . CAD Maternal Grandfather   . CAD Maternal Grandmother     Past Medical History:  Diagnosis Date  . Allergic rhinitis   . Anxiety   . Arthritis    "lower back, right thumb, knees" (10/04/2012)  . Asthma    "grew out of it" (10/04/2012)  . CKD (chronic kidney disease), stage II   . Coronary artery disease    a. s/p prior stent to LAD;  b. LHC 6/14: DES to pRCA. c. DES to dCx 01/2017 with residual disease.  . Depression   . Hyperlipidemia   . Hypertension   . Ischemic cardiomyopathy   . LV (left ventricular) mural thrombus   . MI (myocardial infarction) (HCC) 03/2007  . OSA (obstructive sleep apnea)    mild  . Pneumonia    "as a child" (10/04/2012)  . Sinus bradycardia   . Splenic infarct     Past Surgical History:  Procedure Laterality Date  . ARTHROPLASTY Right 1993   'crushed; removed bone fragments" (10/04/2012)  . CARDIAC CATHETERIZATION  2010  . CORONARY ANGIOPLASTY WITH STENT PLACEMENT  2008; 10/04/2012   "1 + 1" (10/04/2012)  . CORONARY PRESSURE/FFR STUDY N/A 02/26/2017   Procedure: INTRAVASCULAR PRESSURE WIRE/FFR STUDY;  Surgeon: Kathleene Hazel, MD;  [facility]: [facility];  Service: Cardiovascular;  Laterality: N/A;  . CORONARY STENT INTERVENTION N/A 02/26/2017   Procedure: CORONARY STENT INTERVENTION;  Surgeon: Kathleene Hazel, MD;  Location: [facility];  Service: Cardiovascular;  Laterality: N/A;  . EXPLORATORY LAPAROTOMY  1990's?   "went in to repair hernia; found fatty tissue instead; no hernia repair" (10/04/2012)  . LEFT HEART CATH AND CORONARY ANGIOGRAPHY N/A 02/26/2017   Procedure: LEFT HEART CATH AND CORONARY ANGIOGRAPHY;  Surgeon: Kathleene Hazel,  MD;  Location: [facility];  Service: Cardiovascular;  Laterality: N/A;  . LEFT HEART CATH AND CORONARY ANGIOGRAPHY N/A 03/29/2021   Procedure: LEFT HEART CATH AND CORONARY ANGIOGRAPHY;  Surgeon: [place]il[place], Peter M, MD;  Location: [facility] INVASIVE CV LAB;  Service: Cardiovascular;  Laterality: N/A;  . LEFT HEART CATHETERIZATION WITH CORONARY ANGIOGRAM N/A 10/04/2012   Procedure: LEFT HEART CATHETERIZATION WITH CORONARY ANGIOGRAM;  Surgeon: Tonny Bollman, MD;  Location: [facility] CATH LAB;  Service: Cardiovascular;  Laterality: N/A;    Social History:  reports that he quit smoking about 24 years ago. His smoking use included cigarettes. He started smoking about 44 years ago. He has a 10 pack-year smoking history. He has never used smokeless tobacco. He reports current alcohol use. He reports current drug use. Frequency: 14.00 times per week. Drug: Marijuana.  Allergies:  Allergies  Allergen Reactions  . Adhesive [Tape] Other (See Comments)    SKIN SCARRING/IRRITATION. PAPER TAPE IS OKAY    (Not in a hospital admission)    Physical Exam: Blood pressure 114/61, pulse 75, temperature 98.4 F (36.9 C), temperature source Oral, resp. rate 17, height 5\' 9"  (1.753 m), weight 88.5 kg, SpO2 100%. ***  Results for orders placed or performed during the hospital encounter of 01/26/23 (from the past 48 hour(s))  Basic metabolic panel  Austin Soley Jr. [birth year]  [id number].     HPI:  Austin Ingram is a 62 yo male who was recently in a   ROS: ROS  Family History  Problem Relation Age of Onset  . Atrial fibrillation Mother        irregular heart beats  . Hypertension Mother   . Diabetes type II Mother   . CAD Maternal Grandfather   . CAD Maternal Grandmother     Past Medical History:  Diagnosis Date  . Allergic rhinitis   . Anxiety   . Arthritis    "lower back, right thumb, knees" (10/04/2012)  . Asthma    "grew out of it" (10/04/2012)  . CKD (chronic kidney disease), stage II   . Coronary artery disease    a. s/p prior stent to LAD;  b. LHC 6/14: DES to pRCA. c. DES to dCx 01/2017 with residual disease.  . Depression   . Hyperlipidemia   . Hypertension   . Ischemic cardiomyopathy   . LV (left ventricular) mural thrombus   . MI (myocardial infarction) (HCC) 03/2007  . OSA (obstructive sleep apnea)    mild  . Pneumonia    "as a child" (10/04/2012)  . Sinus bradycardia   . Splenic infarct     Past Surgical History:  Procedure Laterality Date  . ARTHROPLASTY Right 1993   'crushed; removed bone fragments" (10/04/2012)  . CARDIAC CATHETERIZATION  2010  . CORONARY ANGIOPLASTY WITH STENT PLACEMENT  2008; 10/04/2012   "1 + 1" (10/04/2012)  . CORONARY PRESSURE/FFR STUDY N/A 02/26/2017   Procedure: INTRAVASCULAR PRESSURE WIRE/FFR STUDY;  Surgeon: Kathleene Hazel, MD;  Location: [facility];  Service: Cardiovascular;  Laterality: N/A;  . CORONARY STENT INTERVENTION N/A 02/26/2017   Procedure: CORONARY STENT INTERVENTION;  Surgeon: Kathleene Hazel, MD;  Location: [facility];  Service: Cardiovascular;  Laterality: N/A;  . EXPLORATORY LAPAROTOMY  1990's?   "went in to repair hernia; found fatty tissue instead; no hernia repair" (10/04/2012)  . LEFT HEART CATH AND CORONARY ANGIOGRAPHY N/A 02/26/2017   Procedure: LEFT HEART CATH AND CORONARY ANGIOGRAPHY;  Surgeon: Kathleene Hazel,  MD;  Location: [facility];  Service: Cardiovascular;  Laterality: N/A;  . LEFT HEART CATH AND CORONARY ANGIOGRAPHY N/A 03/29/2021   Procedure: LEFT HEART CATH AND CORONARY ANGIOGRAPHY;  Surgeon: [place]azil[place], Peter M, MD;  Location: [facility] INVASIVE CV LAB;  Service: Cardiovascular;  Laterality: N/A;  . LEFT HEART CATHETERIZATION WITH CORONARY ANGIOGRAM N/A 10/04/2012   Procedure: LEFT HEART CATHETERIZATION WITH CORONARY ANGIOGRAM;  Surgeon: Tonny Bollman, MD;  Location: [facility] CATH LAB;  Service: Cardiovascular;  Laterality: N/A;    Social History:  reports that he quit smoking about 24 years ago. His smoking use included cigarettes. He started smoking about 44 years ago. He has a 10 pack-year smoking history. He has never used smokeless tobacco. He reports current alcohol use. He reports current drug use. Frequency: 14.00 times per week. Drug: Marijuana.  Allergies:  Allergies  Allergen Reactions  . Adhesive [Tape] Other (See Comments)    SKIN SCARRING/IRRITATION. PAPER TAPE IS OKAY    (Not in a hospital admission)    Physical Exam: Blood pressure 114/61, pulse 75, temperature 98.4 F (36.9 C), temperature source Oral, resp. rate 17, height 5\' 9"  (1.753 m), weight 88.5 kg, SpO2 100%. ***  Results for orders placed or performed during the hospital encounter of 01/26/23 (from the past 48 hour(s))  Basic metabolic panel  EXAM: MRI OF THE RIGHT KNEE WITHOUT CONTRAST TECHNIQUE: Multiplanar, multisequence MR imaging of the knee was performed. No intravenous contrast was administered. COMPARISON:  Radiographs 01/20/2023 FINDINGS: MENISCI Medial meniscus:  Unremarkable Lateral meniscus: Peripherally extruded, with high suspicion for degenerative tearing in the posterior horn and midbody based on accentuated signal and reduction in size contour of the meniscus. LIGAMENTS Cruciates:  Unremarkable Collaterals: Intact, although the fibular collateral ligament connects to a minor fragment of a proximal fibular fracture. CARTILAGE Patellofemoral: Full-thickness chondral defect inferiorly along the posterior patellar ridge with underlying subcortical marrow edema as on image 20 series 6. Otherwise mild chondral thinning. Medial:  Unremarkable Lateral: Severe full-thickness loss of articular cartilage along most of the lateral compartment, possible chondral delamination along the posterosuperior portion of the lateral femoral condyle. Joint:  Small knee effusion.  Mild synovitis. Popliteal Fossa: Diffuse infiltration of edema in the popliteal space (and also in the subcutaneous tissues.).  Mild infiltrative edema in portions of the soleus muscle and lateral head gastrocnemius as well as the biceps femoris. Extensor Mechanism:  Unremarkable Bones: Mildly comminuted nondisplaced fracture of the proximal fibular head as shown on images 11-12 of series 4. Subtle subcortical fracture of the posterior portion of the lateral tibial plateau, mostly a compression type fracture without extension into the proximal tibiofibular joint. Chronic appearing osteochondral lesions along the lateral femoral condyle posteriorly. Endosteal edema along the rim of the medial compartment, particularly anteriorly, possibly from stress injury. Other: In addition to the diffuse subcutaneous edema, there is a fluid collection tracking along the superficial fascia margin primarily adjacent to the biceps femoris muscle, measuring 5.9 by 0.9 by 10.4 cm (volume = 30 cm^3). Given its location, a Norma Fredrickson lesion is not completely excluded. Smaller similar posterior lesion posterior to the semi tendinosis observed on image 15 series 3. IMPRESSION: 1. Mildly comminuted nondisplaced fracture of the proximal fibular head. 2. Subtle subcortical depression fracture of the posterior portion of the lateral tibial plateau, without extension into the proximal tibiofibular joint. 3. Diffuse subcutaneous edema. There is a fluid collection tracking along the superficial fascia margin primarily adjacent to the biceps femoris muscle, measuring 5.9 by 0.9 by 10.4 cm (volume = 30 cc). Given its location, a Norma Fredrickson lesion is not completely excluded. Smaller similar lesion posterior to the semi tendinosis. 4. Peripheral extrusion of the lateral meniscus with high suspicion for degenerative tearing in the posterior horn and midbody. 5. Severe full-thickness loss of articular cartilage along most of the lateral compartment, possible chondral delamination along the posterosuperior portion of the lateral femoral condyle. 6. Full-thickness  chondral defect inferiorly along the posterior patellar ridge with underlying subcortical marrow edema. 7. Endosteal edema along the rim of the medial compartment, particularly anteriorly, possibly from stress injury. 8. Small knee effusion with mild synovitis. Electronically Signed   By: Gaylyn Rong M.D.   On: 01/26/2023 16:57   VAS [place] LOWER EXTREMITY VENOUS (DVT) (ONLY [facility])  Result Date: 01/26/2023  Lower Venous DVT Study Patient Name:  Austin Ingram.  Date of Exam:   01/26/2023 Medical Rec #: [id number]        Accession #:    [id number] Date of Birth: [birth year]        Patient Gender: M Patient Age:   66 years Exam Location:  [facility] Procedure:      VAS [place] LOWER EXTREMITY VENOUS (DVT) Referring Phys: ABIGAIL HARRIS --------------------------------------------------------------------------------  Indications: Swelling, Edema, and Pain.  Risk Factors: Trauma Traffic  Austin Soley Jr. [birth year]  [id number].     HPI:  Austin Ingram is a 62 yo male who was recently in a   ROS: ROS  Family History  Problem Relation Age of Onset  . Atrial fibrillation Mother        irregular heart beats  . Hypertension Mother   . Diabetes type II Mother   . CAD Maternal Grandfather   . CAD Maternal Grandmother     Past Medical History:  Diagnosis Date  . Allergic rhinitis   . Anxiety   . Arthritis    "lower back, right thumb, knees" (10/04/2012)  . Asthma    "grew out of it" (10/04/2012)  . CKD (chronic kidney disease), stage II   . Coronary artery disease    a. s/p prior stent to LAD;  b. LHC 6/14: DES to pRCA. c. DES to dCx 01/2017 with residual disease.  . Depression   . Hyperlipidemia   . Hypertension   . Ischemic cardiomyopathy   . LV (left ventricular) mural thrombus   . MI (myocardial infarction) (HCC) 03/2007  . OSA (obstructive sleep apnea)    mild  . Pneumonia    "as a child" (10/04/2012)  . Sinus bradycardia   . Splenic infarct     Past Surgical History:  Procedure Laterality Date  . ARTHROPLASTY Right 1993   'crushed; removed bone fragments" (10/04/2012)  . CARDIAC CATHETERIZATION  2010  . CORONARY ANGIOPLASTY WITH STENT PLACEMENT  2008; 10/04/2012   "1 + 1" (10/04/2012)  . CORONARY PRESSURE/FFR STUDY N/A 02/26/2017   Procedure: INTRAVASCULAR PRESSURE WIRE/FFR STUDY;  Surgeon: Kathleene Hazel, MD;  Location: [facility];  Service: Cardiovascular;  Laterality: N/A;  . CORONARY STENT INTERVENTION N/A 02/26/2017   Procedure: CORONARY STENT INTERVENTION;  Surgeon: Kathleene Hazel, MD;  Location: [facility];  Service: Cardiovascular;  Laterality: N/A;  . EXPLORATORY LAPAROTOMY  1990's?   "went in to repair hernia; found fatty tissue instead; no hernia repair" (10/04/2012)  . LEFT HEART CATH AND CORONARY ANGIOGRAPHY N/A 02/26/2017   Procedure: LEFT HEART CATH AND CORONARY ANGIOGRAPHY;  Surgeon: Kathleene Hazel,  MD;  Location: [facility];  Service: Cardiovascular;  Laterality: N/A;  . LEFT HEART CATH AND CORONARY ANGIOGRAPHY N/A 03/29/2021   Procedure: LEFT HEART CATH AND CORONARY ANGIOGRAPHY;  Surgeon: [place], Peter M, MD;  Location: [facility] INVASIVE CV LAB;  Service: Cardiovascular;  Laterality: N/A;  . LEFT HEART CATHETERIZATION WITH CORONARY ANGIOGRAM N/A 10/04/2012   Procedure: LEFT HEART CATHETERIZATION WITH CORONARY ANGIOGRAM;  Surgeon: Tonny Bollman, MD;  Location: [facility] CATH LAB;  Service: Cardiovascular;  Laterality: N/A;    Social History:  reports that he quit smoking about 24 years ago. His smoking use included cigarettes. He started smoking about 44 years ago. He has a 10 pack-year smoking history. He has never used smokeless tobacco. He reports current alcohol use. He reports current drug use. Frequency: 14.00 times per week. Drug: Marijuana.  Allergies:  Allergies  Allergen Reactions  . Adhesive [Tape] Other (See Comments)    SKIN SCARRING/IRRITATION. PAPER TAPE IS OKAY    (Not in a hospital admission)    Physical Exam: Blood pressure 114/61, pulse 75, temperature 98.4 F (36.9 C), temperature source Oral, resp. rate 17, height 5\' 9"  (1.753 m), weight 88.5 kg, SpO2 100%. ***  Results for orders placed or performed during the hospital encounter of 01/26/23 (from the past 48 hour(s))  Basic metabolic panel  EXAM: MRI OF THE RIGHT KNEE WITHOUT CONTRAST TECHNIQUE: Multiplanar, multisequence MR imaging of the knee was performed. No intravenous contrast was administered. COMPARISON:  Radiographs 01/20/2023 FINDINGS: MENISCI Medial meniscus:  Unremarkable Lateral meniscus: Peripherally extruded, with high suspicion for degenerative tearing in the posterior horn and midbody based on accentuated signal and reduction in size contour of the meniscus. LIGAMENTS Cruciates:  Unremarkable Collaterals: Intact, although the fibular collateral ligament connects to a minor fragment of a proximal fibular fracture. CARTILAGE Patellofemoral: Full-thickness chondral defect inferiorly along the posterior patellar ridge with underlying subcortical marrow edema as on image 20 series 6. Otherwise mild chondral thinning. Medial:  Unremarkable Lateral: Severe full-thickness loss of articular cartilage along most of the lateral compartment, possible chondral delamination along the posterosuperior portion of the lateral femoral condyle. Joint:  Small knee effusion.  Mild synovitis. Popliteal Fossa: Diffuse infiltration of edema in the popliteal space (and also in the subcutaneous tissues.).  Mild infiltrative edema in portions of the soleus muscle and lateral head gastrocnemius as well as the biceps femoris. Extensor Mechanism:  Unremarkable Bones: Mildly comminuted nondisplaced fracture of the proximal fibular head as shown on images 11-12 of series 4. Subtle subcortical fracture of the posterior portion of the lateral tibial plateau, mostly a compression type fracture without extension into the proximal tibiofibular joint. Chronic appearing osteochondral lesions along the lateral femoral condyle posteriorly. Endosteal edema along the rim of the medial compartment, particularly anteriorly, possibly from stress injury. Other: In addition to the diffuse subcutaneous edema, there is a fluid collection tracking along the superficial fascia margin primarily adjacent to the biceps femoris muscle, measuring 5.9 by 0.9 by 10.4 cm (volume = 30 cm^3). Given its location, a Norma Fredrickson lesion is not completely excluded. Smaller similar posterior lesion posterior to the semi tendinosis observed on image 15 series 3. IMPRESSION: 1. Mildly comminuted nondisplaced fracture of the proximal fibular head. 2. Subtle subcortical depression fracture of the posterior portion of the lateral tibial plateau, without extension into the proximal tibiofibular joint. 3. Diffuse subcutaneous edema. There is a fluid collection tracking along the superficial fascia margin primarily adjacent to the biceps femoris muscle, measuring 5.9 by 0.9 by 10.4 cm (volume = 30 cc). Given its location, a Norma Fredrickson lesion is not completely excluded. Smaller similar lesion posterior to the semi tendinosis. 4. Peripheral extrusion of the lateral meniscus with high suspicion for degenerative tearing in the posterior horn and midbody. 5. Severe full-thickness loss of articular cartilage along most of the lateral compartment, possible chondral delamination along the posterosuperior portion of the lateral femoral condyle. 6. Full-thickness  chondral defect inferiorly along the posterior patellar ridge with underlying subcortical marrow edema. 7. Endosteal edema along the rim of the medial compartment, particularly anteriorly, possibly from stress injury. 8. Small knee effusion with mild synovitis. Electronically Signed   By: Gaylyn Rong M.D.   On: 01/26/2023 16:57   VAS [place] LOWER EXTREMITY VENOUS (DVT) (ONLY [facility])  Result Date: 01/26/2023  Lower Venous DVT Study Patient Name:  Austin Ingram.  Date of Exam:   01/26/2023 Medical Rec #: [id number]        Accession #:    [id number] Date of Birth: [birth year]        Patient Gender: M Patient Age:   66 years Exam Location:  [facility] Procedure:      VAS [place] LOWER EXTREMITY VENOUS (DVT) Referring Phys: ABIGAIL HARRIS --------------------------------------------------------------------------------  Indications: Swelling, Edema, and Pain.  Risk Factors: Trauma Traffic  EXAM: MRI OF THE RIGHT KNEE WITHOUT CONTRAST TECHNIQUE: Multiplanar, multisequence MR imaging of the knee was performed. No intravenous contrast was administered. COMPARISON:  Radiographs 01/20/2023 FINDINGS: MENISCI Medial meniscus:  Unremarkable Lateral meniscus: Peripherally extruded, with high suspicion for degenerative tearing in the posterior horn and midbody based on accentuated signal and reduction in size contour of the meniscus. LIGAMENTS Cruciates:  Unremarkable Collaterals: Intact, although the fibular collateral ligament connects to a minor fragment of a proximal fibular fracture. CARTILAGE Patellofemoral: Full-thickness chondral defect inferiorly along the posterior patellar ridge with underlying subcortical marrow edema as on image 20 series 6. Otherwise mild chondral thinning. Medial:  Unremarkable Lateral: Severe full-thickness loss of articular cartilage along most of the lateral compartment, possible chondral delamination along the posterosuperior portion of the lateral femoral condyle. Joint:  Small knee effusion.  Mild synovitis. Popliteal Fossa: Diffuse infiltration of edema in the popliteal space (and also in the subcutaneous tissues.).  Mild infiltrative edema in portions of the soleus muscle and lateral head gastrocnemius as well as the biceps femoris. Extensor Mechanism:  Unremarkable Bones: Mildly comminuted nondisplaced fracture of the proximal fibular head as shown on images 11-12 of series 4. Subtle subcortical fracture of the posterior portion of the lateral tibial plateau, mostly a compression type fracture without extension into the proximal tibiofibular joint. Chronic appearing osteochondral lesions along the lateral femoral condyle posteriorly. Endosteal edema along the rim of the medial compartment, particularly anteriorly, possibly from stress injury. Other: In addition to the diffuse subcutaneous edema, there is a fluid collection tracking along the superficial fascia margin primarily adjacent to the biceps femoris muscle, measuring 5.9 by 0.9 by 10.4 cm (volume = 30 cm^3). Given its location, a Norma Fredrickson lesion is not completely excluded. Smaller similar posterior lesion posterior to the semi tendinosis observed on image 15 series 3. IMPRESSION: 1. Mildly comminuted nondisplaced fracture of the proximal fibular head. 2. Subtle subcortical depression fracture of the posterior portion of the lateral tibial plateau, without extension into the proximal tibiofibular joint. 3. Diffuse subcutaneous edema. There is a fluid collection tracking along the superficial fascia margin primarily adjacent to the biceps femoris muscle, measuring 5.9 by 0.9 by 10.4 cm (volume = 30 cc). Given its location, a Norma Fredrickson lesion is not completely excluded. Smaller similar lesion posterior to the semi tendinosis. 4. Peripheral extrusion of the lateral meniscus with high suspicion for degenerative tearing in the posterior horn and midbody. 5. Severe full-thickness loss of articular cartilage along most of the lateral compartment,

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
## 2023-01-27 DIAGNOSIS — S82291A Other fracture of shaft of right tibia, initial encounter for closed fracture: Secondary | ICD-10-CM | POA: Diagnosis not present

## 2023-01-27 DIAGNOSIS — M79604 Pain in right leg: Secondary | ICD-10-CM | POA: Diagnosis not present

## 2023-01-27 DIAGNOSIS — S82491A Other fracture of shaft of right fibula, initial encounter for closed fracture: Secondary | ICD-10-CM | POA: Diagnosis not present

## 2023-01-27 DIAGNOSIS — D62 Acute posthemorrhagic anemia: Secondary | ICD-10-CM | POA: Diagnosis not present

## 2023-01-27 DIAGNOSIS — S2231XA Fracture of one rib, right side, initial encounter for closed fracture: Secondary | ICD-10-CM | POA: Diagnosis not present

## 2023-01-27 DIAGNOSIS — D6489 Other specified anemias: Secondary | ICD-10-CM | POA: Diagnosis not present

## 2023-01-27 LAB — CBC
HCT: 21 % — ABNORMAL LOW (ref 39.0–52.0)
Hemoglobin: 6.2 g/dL — CL (ref 13.0–17.0)
MCH: 23.6 pg — ABNORMAL LOW (ref 26.0–34.0)
MCHC: 29.5 g/dL — ABNORMAL LOW (ref 30.0–36.0)
MCV: 79.8 fL — ABNORMAL LOW (ref 80.0–100.0)
Platelets: 257 10*3/uL (ref 150–400)
RBC: 2.63 MIL/uL — ABNORMAL LOW (ref 4.22–5.81)
RDW: 16.6 % — ABNORMAL HIGH (ref 11.5–15.5)
WBC: 6.2 10*3/uL (ref 4.0–10.5)
nRBC: 1 % — ABNORMAL HIGH (ref 0.0–0.2)

## 2023-01-27 LAB — BASIC METABOLIC PANEL
Anion gap: 5 (ref 5–15)
BUN: 13 mg/dL (ref 6–20)
CO2: 29 mmol/L (ref 22–32)
Calcium: 8.1 mg/dL — ABNORMAL LOW (ref 8.9–10.3)
Chloride: 106 mmol/L (ref 98–111)
Creatinine, Ser: 1.1 mg/dL (ref 0.61–1.24)
GFR, Estimated: >60 mL/min (ref >60)
Glucose, Bld: 121 mg/dL — ABNORMAL HIGH (ref 70–99)
Potassium: 3.3 mmol/L — ABNORMAL LOW (ref 3.5–5.1)
Sodium: 140 mmol/L (ref 135–145)

## 2023-01-27 LAB — ABO/RH: ABO/RH(D): O POS

## 2023-01-27 LAB — PREPARE RBC (CROSSMATCH)

## 2023-01-27 LAB — GLUCOSE, CAPILLARY
Glucose-Capillary: 108 mg/dL — ABNORMAL HIGH (ref 70–99)
Glucose-Capillary: 114 mg/dL — ABNORMAL HIGH (ref 70–99)
Glucose-Capillary: 92 mg/dL (ref 70–99)
Glucose-Capillary: 92 mg/dL (ref 70–99)

## 2023-01-27 MED ORDER — SODIUM CHLORIDE 0.9% IV SOLUTION: Freq: Once | INTRAVENOUS | Status: AC

## 2023-01-27 NOTE — Plan of Care (Signed)
  Problem: Skin Integrity: Goal: Risk for impaired skin integrity will decrease Outcome: Progressing   Problem: Education: Goal: Knowledge of General Education information will improve Description: Including pain rating scale, medication(s)/side effects and non-pharmacologic comfort measures Outcome: Progressing   Problem: Pain Managment: Goal: General experience of comfort will improve Outcome: Progressing

## 2023-01-27 NOTE — Progress Notes (Signed)
Patient refused taking his medicines, took only 50 mg of hydralazine telling that he takes only 50mg  at home at night. He's states that the medicine he has been taking are making him to throw up which did twice today and wants to clear up his head from medicine. He feels comfortable with knee immobilizer and is not in pain and said will ask for medicine if he will have pain. On trying to give him zofran for his nausea he said that if he keeps on taking all the medicine we give, he will be staying in the hospital taking all the medicines and nausea medicine. About his home medicine which he was taking till this morning, he told that he hasn't brought any home medicine when he was asked about during admission and today he says that he was not in his state of mind when he said that.

## 2023-01-27 NOTE — Progress Notes (Signed)
Date and time results received: 01/27/23 0530 (use smartphrase ".now" to insert current time)  Test: Hb Critical Value: 6.2  Name of Provider Notified: Sophronia Simas  Orders Received Or Actions Taken: Orders Received - See Orders for details
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
## 2023-01-27 NOTE — Plan of Care (Signed)
  Problem: Nutrition: Goal: Risk of aspiration will decrease Outcome: Progressing Goal: Dietary intake will improve Outcome: Progressing   Problem: Education: Goal: Ability to describe self-care measures that may prevent or decrease complications (Diabetes Survival Skills Education) will improve Outcome: Progressing Goal: Individualized Educational Video(s) Outcome: Progressing   Problem: Coping: Goal: Ability to adjust to condition or change in health will improve Outcome: Progressing   Problem: Fluid Volume: Goal: Ability to maintain a balanced intake and output will improve Outcome: Progressing   Problem: Health Behavior/Discharge Planning: Goal: Ability to identify and utilize available resources and services will improve Outcome: Progressing Goal: Ability to manage health-related needs will improve Outcome: Progressing

## 2023-01-27 NOTE — Progress Notes (Signed)
Orthopedic Tech Progress Note Patient Details:  Austin Ingram. 08-10-1960 [id number]  Knee immobilizer placed to RLE. Pt states his discomfort improved significantly with the knee immobilizer on. Ice and elevation were encouraged.   Ortho Devices Type of Ortho Device: Knee Immobilizer Ortho Device/Splint Location: RLE Ortho Device/Splint Interventions: Ordered, Application, Adjustment   Post Interventions Patient Tolerated: Well Instructions Provided: Care of device, Adjustment of device  [name] [name] 01/27/2023, 2:06 PM
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
## 2023-01-28 ENCOUNTER — Observation Stay (HOSPITAL_COMMUNITY): Payer: Federal, State, Local not specified - PPO

## 2023-01-28 DIAGNOSIS — S82291A Other fracture of shaft of right tibia, initial encounter for closed fracture: Secondary | ICD-10-CM | POA: Diagnosis not present

## 2023-01-28 DIAGNOSIS — S92511A Displaced fracture of proximal phalanx of right lesser toe(s), initial encounter for closed fracture: Secondary | ICD-10-CM | POA: Diagnosis not present

## 2023-01-28 DIAGNOSIS — S92331A Displaced fracture of third metatarsal bone, right foot, initial encounter for closed fracture: Secondary | ICD-10-CM | POA: Diagnosis not present

## 2023-01-28 DIAGNOSIS — S2231XA Fracture of one rib, right side, initial encounter for closed fracture: Secondary | ICD-10-CM | POA: Diagnosis not present

## 2023-01-28 DIAGNOSIS — D62 Acute posthemorrhagic anemia: Secondary | ICD-10-CM | POA: Diagnosis not present

## 2023-01-28 DIAGNOSIS — S92321A Displaced fracture of second metatarsal bone, right foot, initial encounter for closed fracture: Secondary | ICD-10-CM | POA: Diagnosis not present

## 2023-01-28 DIAGNOSIS — M19071 Primary osteoarthritis, right ankle and foot: Secondary | ICD-10-CM | POA: Diagnosis not present

## 2023-01-28 DIAGNOSIS — S82491A Other fracture of shaft of right fibula, initial encounter for closed fracture: Secondary | ICD-10-CM | POA: Diagnosis not present

## 2023-01-28 LAB — BPAM RBC
Blood Product Expiration Date: 202410262359
Blood Product Expiration Date: 202410262359
ISSUE DATE / TIME: 202409281052
ISSUE DATE / TIME: 202409281550
Unit Type and Rh: 5100
Unit Type and Rh: 5100

## 2023-01-28 LAB — CBC
HCT: 27.9 % — ABNORMAL LOW (ref 39.0–52.0)
HCT: 27.8 % — ABNORMAL LOW (ref 39.0–52.0)
Hemoglobin: 8.5 g/dL — ABNORMAL LOW (ref 13.0–17.0)
Hemoglobin: 8.6 g/dL — ABNORMAL LOW (ref 13.0–17.0)
MCH: 24.9 pg — ABNORMAL LOW (ref 26.0–34.0)
MCH: 25.2 pg — ABNORMAL LOW (ref 26.0–34.0)
MCHC: 30.5 g/dL (ref 30.0–36.0)
MCHC: 30.9 g/dL (ref 30.0–36.0)
MCV: 81.8 fL (ref 80.0–100.0)
MCV: 81.5 fL (ref 80.0–100.0)
Platelets: 261 10*3/uL (ref 150–400)
Platelets: 285 10*3/uL (ref 150–400)
RBC: 3.41 MIL/uL — ABNORMAL LOW (ref 4.22–5.81)
RBC: 3.41 MIL/uL — ABNORMAL LOW (ref 4.22–5.81)
RDW: 17.3 % — ABNORMAL HIGH (ref 11.5–15.5)
RDW: 17.7 % — ABNORMAL HIGH (ref 11.5–15.5)
WBC: 7.9 10*3/uL (ref 4.0–10.5)
WBC: 7 10*3/uL (ref 4.0–10.5)
nRBC: 1 % — ABNORMAL HIGH (ref 0.0–0.2)
nRBC: 1.1 % — ABNORMAL HIGH (ref 0.0–0.2)

## 2023-01-28 LAB — TYPE AND SCREEN
ABO/RH(D): O POS
Antibody Screen: NEGATIVE
Unit division: 0
Unit division: 0

## 2023-01-28 LAB — BASIC METABOLIC PANEL
Anion gap: 6 (ref 5–15)
BUN: 14 mg/dL (ref 6–20)
CO2: 26 mmol/L (ref 22–32)
Calcium: 8.3 mg/dL — ABNORMAL LOW (ref 8.9–10.3)
Chloride: 105 mmol/L (ref 98–111)
Creatinine, Ser: 1.09 mg/dL (ref 0.61–1.24)
GFR, Estimated: >60 mL/min (ref >60)
Glucose, Bld: 99 mg/dL (ref 70–99)
Potassium: 3.7 mmol/L (ref 3.5–5.1)
Sodium: 137 mmol/L (ref 135–145)

## 2023-01-28 LAB — GLUCOSE, CAPILLARY
Glucose-Capillary: 96 mg/dL (ref 70–99)
Glucose-Capillary: 122 mg/dL — ABNORMAL HIGH (ref 70–99)
Glucose-Capillary: 104 mg/dL — ABNORMAL HIGH (ref 70–99)
Glucose-Capillary: 101 mg/dL — ABNORMAL HIGH (ref 70–99)

## 2023-01-28 LAB — MAGNESIUM: Magnesium: 1.9 mg/dL (ref 1.7–2.4)

## 2023-01-28 MED ORDER — METHOCARBAMOL 500 MG PO TABS: 500.0000 mg | ORAL_TABLET | Freq: Three times a day (TID) | ORAL | Status: DC | PRN

## 2023-01-28 MED ORDER — TRAMADOL HCL 50 MG PO TABS
50.0000 mg | ORAL_TABLET | Freq: Four times a day (QID) | ORAL | Status: DC | PRN
  Administered 2023-01-28 – 2023-01-29 (×2): 100 mg via ORAL
  Filled 2023-01-28 (×2): qty 2
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
## 2023-01-28 NOTE — Plan of Care (Signed)
  Problem: Health Behavior/Discharge Planning: Goal: Ability to manage health-related needs will improve Outcome: Progressing   Problem: Nutritional: Goal: Maintenance of adequate nutrition will improve Outcome: Progressing   Problem: Skin Integrity: Goal: Risk for impaired skin integrity will decrease Outcome: Progressing   

## 2023-01-28 NOTE — Plan of Care (Signed)

## 2023-01-28 NOTE — Care Plan (Signed)
RN Clinical research associate attempted to give patient scheduled medications, however patient stated he took his own supply. RN Clinical research associate educated patient and advised this patient not to take medication from home, and instead we will give medications supplied here in hospital. Patient agreed with this plan. Will continue plan of care for patient.

## 2023-01-28 NOTE — Plan of Care (Signed)
  Problem: Skin Integrity: Goal: Risk for impaired skin integrity will decrease Outcome: Progressing   Problem: Health Behavior/Discharge Planning: Goal: Ability to manage health-related needs will improve Outcome: Progressing   Problem: Activity: Goal: Risk for activity intolerance will decrease Outcome: Progressing
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
## 2023-01-29 ENCOUNTER — Observation Stay (HOSPITAL_COMMUNITY): Payer: Federal, State, Local not specified - PPO

## 2023-01-29 DIAGNOSIS — M25462 Effusion, left knee: Secondary | ICD-10-CM | POA: Diagnosis not present

## 2023-01-29 DIAGNOSIS — D62 Acute posthemorrhagic anemia: Secondary | ICD-10-CM | POA: Diagnosis not present

## 2023-01-29 DIAGNOSIS — S82291A Other fracture of shaft of right tibia, initial encounter for closed fracture: Secondary | ICD-10-CM | POA: Diagnosis not present

## 2023-01-29 DIAGNOSIS — R600 Localized edema: Secondary | ICD-10-CM | POA: Diagnosis not present

## 2023-01-29 DIAGNOSIS — S2231XA Fracture of one rib, right side, initial encounter for closed fracture: Secondary | ICD-10-CM | POA: Diagnosis not present

## 2023-01-29 DIAGNOSIS — S99911A Unspecified injury of right ankle, initial encounter: Secondary | ICD-10-CM | POA: Diagnosis not present

## 2023-01-29 DIAGNOSIS — M19072 Primary osteoarthritis, left ankle and foot: Secondary | ICD-10-CM | POA: Diagnosis not present

## 2023-01-29 DIAGNOSIS — M1712 Unilateral primary osteoarthritis, left knee: Secondary | ICD-10-CM | POA: Diagnosis not present

## 2023-01-29 DIAGNOSIS — S82491A Other fracture of shaft of right fibula, initial encounter for closed fracture: Secondary | ICD-10-CM | POA: Diagnosis not present

## 2023-01-29 DIAGNOSIS — S99912A Unspecified injury of left ankle, initial encounter: Secondary | ICD-10-CM | POA: Diagnosis not present

## 2023-01-29 LAB — CBC
HCT: 27.3 % — ABNORMAL LOW (ref 39.0–52.0)
Hemoglobin: 8.5 g/dL — ABNORMAL LOW (ref 13.0–17.0)
MCH: 25.1 pg — ABNORMAL LOW (ref 26.0–34.0)
MCHC: 31.1 g/dL (ref 30.0–36.0)
MCV: 80.5 fL (ref 80.0–100.0)
Platelets: 285 10*3/uL (ref 150–400)
RBC: 3.39 MIL/uL — ABNORMAL LOW (ref 4.22–5.81)
RDW: 18.4 % — ABNORMAL HIGH (ref 11.5–15.5)
WBC: 6.1 10*3/uL (ref 4.0–10.5)
nRBC: 0.5 % — ABNORMAL HIGH (ref 0.0–0.2)

## 2023-01-29 LAB — GLUCOSE, CAPILLARY
Glucose-Capillary: 84 mg/dL (ref 70–99)
Glucose-Capillary: 111 mg/dL — ABNORMAL HIGH (ref 70–99)
Glucose-Capillary: 93 mg/dL (ref 70–99)

## 2023-01-29 LAB — HEMOGLOBIN A1C
Hgb A1c MFr Bld: 5.4 % (ref 4.8–5.6)
Mean Plasma Glucose: 108.28 mg/dL

## 2023-01-29 MED ORDER — ACETAMINOPHEN 500 MG PO TABS: 1000.0000 mg | ORAL_TABLET | Freq: Four times a day (QID) | ORAL | Status: AC | PRN

## 2023-01-29 MED ORDER — METHOCARBAMOL 500 MG PO TABS: 500.0000 mg | ORAL_TABLET | Freq: Three times a day (TID) | ORAL | 0 refills | Status: AC | PRN

## 2023-01-29 MED ORDER — APIXABAN 5 MG PO TABS
5.0000 mg | ORAL_TABLET | Freq: Two times a day (BID) | ORAL | Status: DC
  Administered 2023-01-29: 5 mg via ORAL
  Filled 2023-01-29: qty 1

## 2023-01-29 MED ORDER — TRAMADOL HCL 50 MG PO TABS: 50.0000 mg | ORAL_TABLET | Freq: Four times a day (QID) | ORAL | 0 refills | Status: AC | PRN
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
## 2023-01-29 NOTE — Progress Notes (Signed)
Discharge summary (AVS) provided to pt with instructions. Pt verbalized understanding of instructions. Pt d/c to home as ordered, Per pt DMEs to be delivered at his residence, No complaints , family member is responsible for his ride.
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
## 2023-01-29 NOTE — Progress Notes (Signed)
Orthopedic Tech Progress Note Patient Details:  Sal Medders Montez Hageman. 1961/01/23 [id number]  Ortho Devices Type of Ortho Device: Postop shoe/boot Ortho Device/Splint Location: Right foot Ortho Device/Splint Interventions: Application   Post Interventions Patient Tolerated: Well Instructions Provided: Care of device, Adjustment of device  Curtina Grills E Tiffony Kite 01/29/2023, 9:58 AM
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
## 2023-01-29 NOTE — Plan of Care (Signed)
  Problem: Coping: Goal: Will identify appropriate support needs Outcome: Progressing
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
## 2023-01-31 DIAGNOSIS — S82209A Unspecified fracture of shaft of unspecified tibia, initial encounter for closed fracture: Secondary | ICD-10-CM | POA: Diagnosis not present

## 2023-02-01 DIAGNOSIS — S82209A Unspecified fracture of shaft of unspecified tibia, initial encounter for closed fracture: Secondary | ICD-10-CM | POA: Diagnosis not present

## 2023-02-09 DIAGNOSIS — S8781XA Crushing injury of right lower leg, initial encounter: Secondary | ICD-10-CM | POA: Diagnosis not present

## 2023-02-09 DIAGNOSIS — S8782XA Crushing injury of left lower leg, initial encounter: Secondary | ICD-10-CM | POA: Diagnosis not present

## 2023-02-12 ENCOUNTER — Encounter: Payer: Self-pay | Admitting: Physical Therapy

## 2023-02-12 ENCOUNTER — Other Ambulatory Visit: Payer: Self-pay

## 2023-02-12 ENCOUNTER — Ambulatory Visit: Payer: Federal, State, Local not specified - PPO | Attending: Family Medicine | Admitting: Physical Therapy

## 2023-02-12 DIAGNOSIS — S82124A Nondisplaced fracture of lateral condyle of right tibia, initial encounter for closed fracture: Secondary | ICD-10-CM | POA: Insufficient documentation

## 2023-02-12 DIAGNOSIS — M25561 Pain in right knee: Secondary | ICD-10-CM | POA: Diagnosis not present

## 2023-02-12 DIAGNOSIS — R2689 Other abnormalities of gait and mobility: Secondary | ICD-10-CM | POA: Diagnosis not present

## 2023-02-12 DIAGNOSIS — M25562 Pain in left knee: Secondary | ICD-10-CM | POA: Diagnosis not present

## 2023-02-12 DIAGNOSIS — M6281 Muscle weakness (generalized): Secondary | ICD-10-CM | POA: Diagnosis not present
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
## 2023-02-12 NOTE — Patient Instructions (Signed)
Access Code: [email or web address] URL: [email or web address] Date: 02/12/2023 Prepared by: Rosana Hoes  Exercises - Supine Quad Set  - 2 x daily - 20 reps - 5 seconds hold - Active Straight Leg Raise with Quad Set  - 2 x daily - 2 sets - 10 reps - Supine Heel Slide with Strap  - 2 x daily - 10 reps - 10 seconds hold - Seated Long Arc Quad  - 2 x daily - 2 sets - 10 reps

## 2023-02-13 ENCOUNTER — Ambulatory Visit (HOSPITAL_COMMUNITY)
Admission: RE | Admit: 2023-02-13 | Discharge: 2023-02-13 | Disposition: A | Discharge status: Home / Self Care | Payer: Federal, State, Local not specified - PPO | Source: Ambulatory Visit | Attending: Cardiology | Admitting: Cardiology

## 2023-02-13 ENCOUNTER — Encounter (HOSPITAL_COMMUNITY): Payer: Self-pay | Admitting: Cardiology

## 2023-02-13 VITALS — BP 140/80 | HR 78 | Wt 186.4 lb

## 2023-02-13 DIAGNOSIS — I2510 Atherosclerotic heart disease of native coronary artery without angina pectoris: Secondary | ICD-10-CM | POA: Diagnosis not present

## 2023-02-13 DIAGNOSIS — I255 Ischemic cardiomyopathy: Secondary | ICD-10-CM | POA: Diagnosis not present

## 2023-02-13 DIAGNOSIS — I5042 Chronic combined systolic (congestive) and diastolic (congestive) heart failure: Secondary | ICD-10-CM | POA: Diagnosis not present

## 2023-02-13 DIAGNOSIS — Z8673 Personal history of transient ischemic attack (TIA), and cerebral infarction without residual deficits: Secondary | ICD-10-CM | POA: Diagnosis not present

## 2023-02-13 DIAGNOSIS — I252 Old myocardial infarction: Secondary | ICD-10-CM | POA: Diagnosis not present

## 2023-02-13 DIAGNOSIS — Z91148 Patient's other noncompliance with medication regimen for other reason: Secondary | ICD-10-CM | POA: Insufficient documentation

## 2023-02-13 DIAGNOSIS — Z955 Presence of coronary angioplasty implant and graft: Secondary | ICD-10-CM | POA: Insufficient documentation

## 2023-02-13 DIAGNOSIS — Z86718 Personal history of other venous thrombosis and embolism: Secondary | ICD-10-CM | POA: Insufficient documentation

## 2023-02-13 DIAGNOSIS — Z79899 Other long term (current) drug therapy: Secondary | ICD-10-CM | POA: Diagnosis not present

## 2023-02-13 DIAGNOSIS — I110 Hypertensive heart disease with heart failure: Secondary | ICD-10-CM | POA: Insufficient documentation

## 2023-02-13 DIAGNOSIS — Z7901 Long term (current) use of anticoagulants: Secondary | ICD-10-CM | POA: Insufficient documentation

## 2023-02-13 DIAGNOSIS — I5022 Chronic systolic (congestive) heart failure: Secondary | ICD-10-CM

## 2023-02-13 LAB — CBC
HCT: 39.6 % (ref 39.0–52.0)
Hemoglobin: 12 g/dL — ABNORMAL LOW (ref 13.0–17.0)
MCH: 24.1 pg — ABNORMAL LOW (ref 26.0–34.0)
MCHC: 30.3 g/dL (ref 30.0–36.0)
MCV: 79.7 fL — ABNORMAL LOW (ref 80.0–100.0)
Platelets: 347 10*3/uL (ref 150–400)
RBC: 4.97 MIL/uL (ref 4.22–5.81)
RDW: 16.8 % — ABNORMAL HIGH (ref 11.5–15.5)
WBC: 6.7 10*3/uL (ref 4.0–10.5)
nRBC: 0 % (ref 0.0–0.2)

## 2023-02-13 LAB — BASIC METABOLIC PANEL
Anion gap: 7 (ref 5–15)
BUN: 10 mg/dL (ref 6–20)
CO2: 25 mmol/L (ref 22–32)
Calcium: 9.4 mg/dL (ref 8.9–10.3)
Chloride: 107 mmol/L (ref 98–111)
Creatinine, Ser: 1 mg/dL (ref 0.61–1.24)
GFR, Estimated: >60 mL/min (ref >60)
Glucose, Bld: 95 mg/dL (ref 70–99)
Potassium: 3.5 mmol/L (ref 3.5–5.1)
Sodium: 139 mmol/L (ref 135–145)

## 2023-02-13 LAB — BRAIN NATRIURETIC PEPTIDE: B Natriuretic Peptide: 207.4 pg/mL — ABNORMAL HIGH (ref 0.0–100.0)

## 2023-02-13 MED ORDER — ENTRESTO 49-51 MG PO TABS: 1.0000 | ORAL_TABLET | Freq: Two times a day (BID) | ORAL | 11 refills | Status: DC

## 2023-02-13 MED ORDER — ISOSORBIDE MONONITRATE ER 60 MG PO TB24: 90.0000 mg | ORAL_TABLET | Freq: Every day | ORAL | 3 refills | Status: DC

## 2023-02-13 NOTE — Patient Instructions (Signed)
RESTART Entresto 49/51 mg Twice daily  STOP Asprin.  Labs done today, your results will be available in MyChart, we will contact you for abnormal readings.  Please follow up with our heart failure pharmacist in 3 weeks.  Your physician recommends that you schedule a follow-up appointment in: 6 weeks.  If you have any questions or concerns before your next appointment please send [place] a message through [place] or call our office at [phone].    TO LEAVE A MESSAGE FOR THE NURSE SELECT OPTION 2, PLEASE LEAVE A MESSAGE INCLUDING: YOUR NAME DATE OF BIRTH CALL BACK NUMBER REASON FOR CALL**this is important as we prioritize the call backs  YOU WILL RECEIVE A CALL BACK THE SAME DAY AS LONG AS YOU CALL BEFORE 4:00 PM  At the [facility], you and your health needs are our priority. As part of our continuing mission to provide you with exceptional heart care, we have created designated Provider Care Teams. These Care Teams include your primary Cardiologist (physician) and Advanced Practice Providers (APPs- Physician Assistants and Nurse Practitioners) who all work together to provide you with the care you need, when you need it.   You may see any of the following providers on your designated Care Team at your next follow up: Dr Arvilla Meres Dr Marca Ancona Dr. Dorthula Nettles Dr. Clearnce Hasten Amy Filbert Schilder, NP Robbie Lis, [place] [facility] [place], [place] Brynda Peon, NP [place] Lee, NP Karle Plumber, PharmD   Please be sure to bring in all your medications bottles to every appointment.    Thank you for choosing [facility]
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
## 2023-02-21 ENCOUNTER — Telehealth (HOSPITAL_COMMUNITY): Payer: Self-pay

## 2023-02-21 ENCOUNTER — Other Ambulatory Visit (HOSPITAL_COMMUNITY): Payer: Self-pay

## 2023-02-21 NOTE — Telephone Encounter (Signed)
Patient Advocate Encounter  Received message about pt concerns with medication costs. Spoke to pharmacy to reprocess. This pt currently has an active [employer], as well as an [employer] plan.  Both Entresto and Jardiance are better covered by the [facility] plan - Jardiance returns $35 for 30 days, Entresto $393 for 30 days.  This pt is eligible to use copay saving cards with both medications. Left voicemail for patient to call me back to discuss further.  Burnell Blanks, CPhT Rx Patient Advocate Phone: [phone]

## 2023-02-22 DIAGNOSIS — H2513 Age-related nuclear cataract, bilateral: Secondary | ICD-10-CM | POA: Diagnosis not present

## 2023-02-22 DIAGNOSIS — H5310 Unspecified subjective visual disturbances: Secondary | ICD-10-CM | POA: Diagnosis not present

## 2023-02-22 DIAGNOSIS — H524 Presbyopia: Secondary | ICD-10-CM | POA: Diagnosis not present

## 2023-02-22 DIAGNOSIS — H04123 Dry eye syndrome of bilateral lacrimal glands: Secondary | ICD-10-CM | POA: Diagnosis not present
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
## 2023-02-26 ENCOUNTER — Ambulatory Visit: Payer: Federal, State, Local not specified - PPO | Admitting: Physical Therapy

## 2023-02-26 ENCOUNTER — Encounter: Payer: Self-pay | Admitting: Physical Therapy

## 2023-02-26 ENCOUNTER — Other Ambulatory Visit: Payer: Self-pay

## 2023-02-26 DIAGNOSIS — R2689 Other abnormalities of gait and mobility: Secondary | ICD-10-CM

## 2023-02-26 DIAGNOSIS — M25561 Pain in right knee: Secondary | ICD-10-CM

## 2023-02-26 DIAGNOSIS — M6281 Muscle weakness (generalized): Secondary | ICD-10-CM | POA: Diagnosis not present

## 2023-02-26 DIAGNOSIS — M25562 Pain in left knee: Secondary | ICD-10-CM

## 2023-02-26 DIAGNOSIS — S82124A Nondisplaced fracture of lateral condyle of right tibia, initial encounter for closed fracture: Secondary | ICD-10-CM | POA: Diagnosis not present

## 2023-02-26 NOTE — Therapy (Signed)
OUTPATIENT PHYSICAL THERAPY TREATMENT   Patient Name: Austin Ingram. MRN: [id number] DOB:25-Jun-1960, 62 y.o., male Today's Date: 02/26/2023   END OF SESSION:  PT End of Session - 02/26/23 0854     Visit Number 2    Number of Visits 17    Date for PT Re-Evaluation 04/09/23    Authorization Type [employer]    PT Start Time 0845    PT Stop Time 0930    PT Time Calculation (min) 45 min    Activity Tolerance Patient tolerated treatment well    Behavior During Therapy WFL for tasks assessed/performed              Past Medical History:  Diagnosis Date   Allergic rhinitis    Anxiety    Arthritis    "lower back, right thumb, knees" (10/04/2012)   Asthma    "grew out of it" (10/04/2012)   CKD (chronic kidney disease), stage II    Coronary artery disease    a. s/p prior stent to LAD;  b. LHC 6/14: DES to pRCA. c. DES to dCx 01/2017 with residual disease.   Depression    Hyperlipidemia    Hypertension    Ischemic cardiomyopathy    LV (left ventricular) mural thrombus    MI (myocardial infarction) (HCC) 03/2007   OSA (obstructive sleep apnea)    mild   Pneumonia    "as a child" (10/04/2012)   Sinus bradycardia    Splenic infarct    Past Surgical History:  Procedure Laterality Date   ARTHROPLASTY Right 1993   'crushed; removed bone fragments" (10/04/2012)   CARDIAC CATHETERIZATION  2010   CORONARY ANGIOPLASTY WITH STENT PLACEMENT  2008; 10/04/2012   "1 + 1" (10/04/2012)   CORONARY PRESSURE/FFR STUDY N/A 02/26/2017   Procedure: INTRAVASCULAR PRESSURE WIRE/FFR STUDY;  Surgeon: Kathleene Hazel, MD;  Location: [facility] INVASIVE CV LAB;  Service: Cardiovascular;  Laterality: N/A;   CORONARY STENT INTERVENTION N/A 02/26/2017   Procedure: CORONARY STENT INTERVENTION;  Surgeon: Kathleene Hazel, MD;  Location: [facility] INVASIVE CV LAB;  Service: Cardiovascular;  Laterality: N/A;   EXPLORATORY LAPAROTOMY  1990's?   "went in to repair hernia; found fatty tissue instead; no hernia repair"  (10/04/2012)   LEFT HEART CATH AND CORONARY ANGIOGRAPHY N/A 02/26/2017   Procedure: LEFT HEART CATH AND CORONARY ANGIOGRAPHY;  Surgeon: Kathleene Hazel, MD;  Location: [facility] INVASIVE CV LAB;  Service: Cardiovascular;  Laterality: N/A;   LEFT HEART CATH AND CORONARY ANGIOGRAPHY N/A 03/29/2021   Procedure: LEFT HEART CATH AND CORONARY ANGIOGRAPHY;  Surgeon: Swazil[place], Peter M, MD;  Location: [facility] INVASIVE CV LAB;  Service: Cardiovascular;  Laterality: N/A;   LEFT HEART CATHETERIZATION WITH CORONARY ANGIOGRAM N/A 10/04/2012   Procedure: LEFT HEART CATHETERIZATION WITH CORONARY ANGIOGRAM;  Surgeon: Tonny Bollman, MD;  Location: [facility] CATH LAB;  Service: Cardiovascular;  Laterality: N/A;   Patient Active Problem List   Diagnosis Date Noted   Tibia fracture 01/26/2023   Long term (current) use of anticoagulants 06/07/2022   TIA (transient ischemic attack) 06/02/2022   Acute CVA (cerebrovascular accident) (HCC) 06/01/2022   Sinus bradycardia 06/01/2022   CKD (chronic kidney disease), stage II 06/01/2022   Cardiac akinesia 06/01/2022   LLQ pain 03/28/2021   BPH (benign prostatic hyperplasia) 03/28/2021   Spleen injury 03/28/2021   LV (left ventricular) mural thrombus 03/28/2021   Chronic combined systolic and diastolic heart failure (HCC) 03/28/2021   Abnormal CT scan, kidney 03/28/2021   NSTEMI (non-ST elevated myocardial infarction) (  HCC) 03/27/2021   Chest pain 03/06/2017   Chest pain with moderate risk for cardiac etiology    Unstable angina (HCC)    Ischemic cardiomyopathy 02/20/2017   Exertional angina (HCC) 10/05/2012   CAD (coronary artery disease) 12/19/2010   CIRCADIAN RHYTHM SLEEP DISORDER SHIFT WORK TYPE 03/10/2010   INADEQUATE SLEEP HYGIENE 08/20/2007   OSA (obstructive sleep apnea) 08/20/2007   Allergic rhinitis 08/20/2007   Asthma 08/20/2007   HLD (hyperlipidemia) 08/19/2007   HTN (hypertension) 08/19/2007   Acute myocardial infarction (HCC) 08/19/2007   Coronary  atherosclerosis 08/19/2007    PCP: Clovis Riley, L.[name], MD  REFERRING PROVIDER: Barnetta Chapel, [name]  REFERRING DIAG: Closed nondisplaced fracture of lateral condyle of right tibia, initial encounter  THERAPY DIAG:  Acute pain of right knee  Acute pain of left knee  Muscle weakness (generalized)  Other abnormalities of gait and mobility  Rationale for Evaluation and Treatment: Rehabilitation  ONSET DATE: 01/20/2023   SUBJECTIVE:  SUBJECTIVE STATEMENT: Patient reports he is still heeling. He can tell his flexibility is better but he can feel the scabs pulling.  PERTINENT HISTORY: Right tibial plateau and proximal fibula head fracture, left fibular fracture  PAIN:  Are you having pain? Yes:  NPRS scale: 5-6/10 Pain location: Bilateral knees Pain description: Uncomfortable Aggravating factors: Walking, activity  Relieving factors: Ice, medication  PRECAUTIONS: None  WEIGHT BEARING RESTRICTIONS: Yes right TTWB  PATIENT GOALS: Pain relief, get back to walking and prior level of function   OBJECTIVE:  Note: Objective measures were completed at Evaluation unless otherwise noted. PATIENT SURVEYS:  FOTO 14% functional status  EDEMA:  Patient does exhibit right lower leg edema compared to the left side   MUSCLE LENGTH: Limitation with bilateral hamstrings and calf  PALPATION: Generalized tenderness of right knee and lateral tenderness of left knee  LOWER EXTREMITY ROM:  Active ROM Right eval Left eval Right 02/26/2023  Hip flexion     Hip extension     Hip abduction     Hip adduction     Hip internal rotation     Hip external rotation     Knee flexion 103 125 120  Knee extension Lacking 8 0 0  Ankle dorsiflexion     Ankle plantarflexion     Ankle inversion     Ankle eversion      (Blank rows = not tested)  LOWER EXTREMITY MMT:  MMT Right eval Left eval  Hip flexion 4 4  Hip extension    Hip abduction 2 3  Hip adduction    Hip internal rotation     Hip external rotation    Knee flexion 4- 4  Knee extension 4 4  Ankle dorsiflexion    Ankle plantarflexion    Ankle inversion    Ankle eversion     (Blank rows = not tested)  FUNCTIONAL TESTS:  Not assessed  GAIT: Assistive device utilized: Environmental consultant - 2 wheeled Level of assistance: Modified independence Comments: TTWB on right   TODAY'S TREATMENT [facility] Adult PT Treatment:                                                DATE: 02/26/2023 Therapeutic Exercise: Quad set 10 x 5 sec SAQ with 2# 2 x 10 each SLR 2 x 10 Hooklying adductor ball squeeze 2 x 10 Sidelying hip abduction 2 x 10  each Bridge with bolster under legs 2 x 10 LAQ with 2# 3 x 10 each Seated hamstring curl with green 2 x 10 each Longsitting ankle PF with green 2 x 20 each Prone hamstring curl 2 x 10 each   [facility] Adult PT Treatment:                                                DATE: 02/12/2023 Therapeutic Exercise: Quad set 10 x 5 sec SLR x 10 Heel slide with strap 5 x 10 sec LAQ x 10  PATIENT EDUCATION:  Education details: HEP update Person educated: Patient and Spouse Education method: Explanation, Demonstration, Tactile cues, Verbal cues, and Handouts Education comprehension: verbalized understanding, returned demonstration, verbal cues required, tactile cues required, and needs further education  HOME EXERCISE PROGRAM: Access Code: [email or web address]   ASSESSMENT: CLINICAL IMPRESSION: Patient tolerated therapy well with no adverse effects. He demonstrates much improved right knee motion this visit and is progressing well with strengthening. Therapy focused primarily on mat based LE strengthening with good tolerance. He did not report any increase in knee pain with therapy. Updated HEP to progress strengthening for home. Patient would benefit from continued skilled PT to progress his mobility and strength in order to reduce pain and maximize functional ability.    OBJECTIVE IMPAIRMENTS: Abnormal gait,  decreased activity tolerance, decreased balance, difficulty walking, decreased ROM, decreased strength, impaired flexibility, and pain.   ACTIVITY LIMITATIONS: carrying, lifting, standing, squatting, stairs, transfers, and locomotion level  PARTICIPATION LIMITATIONS: meal prep, cleaning, laundry, driving, shopping, community activity, and yard work  PERSONAL FACTORS: Fitness, Past/current experiences, and Time since onset of injury/illness/exacerbation are also affecting patient's functional outcome.    GOALS: Goals reviewed with patient? Yes  SHORT TERM GOALS: Target date: 03/12/2023  Patient will be I with initial HEP in order to progress with therapy. Baseline: HEP provided at eval Goal status: INITIAL  2.  Patient will demonstrate right knee AROM 0-125 deg in order to improve his mobility Baseline: right knee AROM 8-103 deg Goal status: INITIAL  3.  Patient will report bilateral knee pain </= 3/10 in order to reduce functional limitations Baseline: 5-6/10 pain Goal status: INITIAL  LONG TERM GOALS: Target date: 04/09/2023  Patient will be I with final HEP to maintain progress from PT. Baseline: HEP provided at eval Goal status: INITIAL  2.  Patient will report >/= 46% status on FOTO to indicate improved functional ability. Baseline: 14% functional status Goal status: INITIAL  3.  Patient will demonstrate bilateral knee strength 5/5 MMT in order to improve his walking and activity tolerance.  Baseline: see limitations above Goal status: INITIAL  4.  Patient will be able to ambulate at community level with no limitations or AD in order to return to prior level of function Baseline: patient limited with ambulation at household level with RW Goal status: INITIAL   PLAN: PT FREQUENCY: 1-2x/week  PT DURATION: 8 weeks  PLANNED INTERVENTIONS: 97146- PT Re-evaluation, 97110-Therapeutic exercises, 97530- Therapeutic activity, 97112- Neuromuscular re-education, 97535- Self  Care, 54098- Manual therapy, [id number]- Gait training, [id number]- Aquatic Therapy, 97014- Electrical stimulation (unattended), [phone]- Electrical stimulation (manual), Balance training, Stair training, Taping, Dry Needling, Joint mobilization, Joint manipulation, Cryotherapy, and Moist heat  PLAN FOR NEXT SESSION: Review HEP and progress PRN, gait training with RW at TTWB until follows-up with ortho, progress knee mobility  and quad strengthening in open chain, hip strengthening   Rosana Hoes, PT, DPT, LAT, ATC 02/26/23  9:35 AM Phone: [phone] Fax: [phone]

## 2023-02-28 ENCOUNTER — Ambulatory Visit: Payer: Federal, State, Local not specified - PPO

## 2023-02-28 DIAGNOSIS — M25562 Pain in left knee: Secondary | ICD-10-CM

## 2023-02-28 DIAGNOSIS — M25561 Pain in right knee: Secondary | ICD-10-CM | POA: Diagnosis not present

## 2023-02-28 DIAGNOSIS — R2689 Other abnormalities of gait and mobility: Secondary | ICD-10-CM | POA: Diagnosis not present

## 2023-02-28 DIAGNOSIS — M6281 Muscle weakness (generalized): Secondary | ICD-10-CM | POA: Diagnosis not present

## 2023-02-28 DIAGNOSIS — S82124A Nondisplaced fracture of lateral condyle of right tibia, initial encounter for closed fracture: Secondary | ICD-10-CM | POA: Diagnosis not present

## 2023-02-28 NOTE — Therapy (Signed)
OUTPATIENT PHYSICAL THERAPY TREATMENT   Patient Name: Austin Ingram. MRN: [id number] DOB:07/25/1960, 62 y.o., male Today's Date: 02/28/2023   END OF SESSION:  PT End of Session - 02/28/23 1016     Visit Number 3    Number of Visits 17    Date for PT Re-Evaluation 04/09/23    Authorization Type BCBS    PT Start Time 1016    PT Stop Time 1057    PT Time Calculation (min) 41 min    Activity Tolerance Patient tolerated treatment well    Behavior During Therapy WFL for tasks assessed/performed               Past Medical History:  Diagnosis Date   Allergic rhinitis    Anxiety    Arthritis    "lower back, right thumb, knees" (10/04/2012)   Asthma    "grew out of it" (10/04/2012)   CKD (chronic kidney disease), stage II    Coronary artery disease    a. s/p prior stent to LAD;  b. LHC 6/14: DES to pRCA. c. DES to dCx 01/2017 with residual disease.   Depression    Hyperlipidemia    Hypertension    Ischemic cardiomyopathy    LV (left ventricular) mural thrombus    MI (myocardial infarction) (HCC) 03/2007   OSA (obstructive sleep apnea)    mild   Pneumonia    "as a child" (10/04/2012)   Sinus bradycardia    Splenic infarct    Past Surgical History:  Procedure Laterality Date   ARTHROPLASTY Right 1993   'crushed; removed bone fragments" (10/04/2012)   CARDIAC CATHETERIZATION  2010   CORONARY ANGIOPLASTY WITH STENT PLACEMENT  2008; 10/04/2012   "1 + 1" (10/04/2012)   CORONARY PRESSURE/FFR STUDY N/A 02/26/2017   Procedure: INTRAVASCULAR PRESSURE WIRE/FFR STUDY;  Surgeon: Kathleene Hazel, MD;  Location: [facility];  Service: Cardiovascular;  Laterality: N/A;   CORONARY STENT INTERVENTION N/A 02/26/2017   Procedure: CORONARY STENT INTERVENTION;  Surgeon: Kathleene Hazel, MD;  Location: [facility];  Service: Cardiovascular;  Laterality: N/A;   EXPLORATORY LAPAROTOMY  1990's?   "went in to repair hernia; found fatty tissue instead; no hernia repair"  (10/04/2012)   LEFT HEART CATH AND CORONARY ANGIOGRAPHY N/A 02/26/2017   Procedure: LEFT HEART CATH AND CORONARY ANGIOGRAPHY;  Surgeon: Kathleene Hazel, MD;  Location: [facility];  Service: Cardiovascular;  Laterality: N/A;   LEFT HEART CATH AND CORONARY ANGIOGRAPHY N/A 03/29/2021   Procedure: LEFT HEART CATH AND CORONARY ANGIOGRAPHY;  Surgeon: Swaziland, Peter M, MD;  Location: [facility] INVASIVE CV LAB;  Service: Cardiovascular;  Laterality: N/A;   LEFT HEART CATHETERIZATION WITH CORONARY ANGIOGRAM N/A 10/04/2012   Procedure: LEFT HEART CATHETERIZATION WITH CORONARY ANGIOGRAM;  Surgeon: Tonny Bollman, MD;  Location: [facility] CATH LAB;  Service: Cardiovascular;  Laterality: N/A;   Patient Active Problem List   Diagnosis Date Noted   Tibia fracture 01/26/2023   Long term (current) use of anticoagulants 06/07/2022   TIA (transient ischemic attack) 06/02/2022   Acute CVA (cerebrovascular accident) (HCC) 06/01/2022   Sinus bradycardia 06/01/2022   CKD (chronic kidney disease), stage II 06/01/2022   Cardiac akinesia 06/01/2022   LLQ pain 03/28/2021   BPH (benign prostatic hyperplasia) 03/28/2021   Spleen injury 03/28/2021   LV (left ventricular) mural thrombus 03/28/2021   Chronic combined systolic and diastolic heart failure (HCC) 03/28/2021   Abnormal CT scan, kidney 03/28/2021   NSTEMI (non-ST elevated myocardial  infarction) (HCC) 03/27/2021   Chest pain 03/06/2017   Chest pain with moderate risk for cardiac etiology    Unstable angina (HCC)    Ischemic cardiomyopathy 02/20/2017   Exertional angina (HCC) 10/05/2012   CAD (coronary artery disease) 12/19/2010   CIRCADIAN RHYTHM SLEEP DISORDER SHIFT WORK TYPE 03/10/2010   INADEQUATE SLEEP HYGIENE 08/20/2007   OSA (obstructive sleep apnea) 08/20/2007   Allergic rhinitis 08/20/2007   Asthma 08/20/2007   HLD (hyperlipidemia) 08/19/2007   HTN (hypertension) 08/19/2007   Acute myocardial infarction (HCC) 08/19/2007   Coronary  atherosclerosis 08/19/2007    PCP: Clovis Riley, L.[name], MD  REFERRING PROVIDER: Barnetta Chapel, [name]  REFERRING DIAG: Closed nondisplaced fracture of lateral condyle of right tibia, initial encounter  THERAPY DIAG:  Acute pain of right knee  Acute pain of left knee  Muscle weakness (generalized)  Rationale for Evaluation and Treatment: Rehabilitation  ONSET DATE: 01/20/2023   SUBJECTIVE:  SUBJECTIVE STATEMENT: Pt presents to PT with reports of continued pain. Has been compliant with HEP with no adverse effect.   PERTINENT HISTORY: Right tibial plateau and proximal fibula head fracture, left fibular fracture  PAIN:  Are you having pain? Yes:  NPRS scale: 5-6/10 Pain location: Bilateral knees Pain description: Uncomfortable Aggravating factors: Walking, activity  Relieving factors: Ice, medication  PRECAUTIONS: None  WEIGHT BEARING RESTRICTIONS: Yes right TTWB  PATIENT GOALS: Pain relief, get back to walking and prior level of function   OBJECTIVE:  Note: Objective measures were completed at Evaluation unless otherwise noted. PATIENT SURVEYS:  FOTO 14% functional status  EDEMA:  Patient does exhibit right lower leg edema compared to the left side   MUSCLE LENGTH: Limitation with bilateral hamstrings and calf  PALPATION: Generalized tenderness of right knee and lateral tenderness of left knee  LOWER EXTREMITY ROM:  Active ROM Right eval Left eval Right 02/26/2023  Hip flexion     Hip extension     Hip abduction     Hip adduction     Hip internal rotation     Hip external rotation     Knee flexion 103 125 120  Knee extension Lacking 8 0 0  Ankle dorsiflexion     Ankle plantarflexion     Ankle inversion     Ankle eversion      (Blank rows = not tested)  LOWER EXTREMITY MMT:  MMT Right eval Left eval  Hip flexion 4 4  Hip extension    Hip abduction 2 3  Hip adduction    Hip internal rotation    Hip external rotation    Knee flexion 4- 4   Knee extension 4 4  Ankle dorsiflexion    Ankle plantarflexion    Ankle inversion    Ankle eversion     (Blank rows = not tested)  FUNCTIONAL TESTS:  Not assessed  GAIT: Assistive device utilized: Environmental consultant - 2 wheeled Level of assistance: Modified independence Comments: TTWB on right   TODAY'S TREATMENT [facility] Adult PT Treatment:                                                DATE: 02/26/2023 Therapeutic Exercise: Quad set 10 x 5 sec SAQ with 2.5# 2x10 each SLR 2x10 Bridge with ball 2x15 S/L clamshell 2x15 RTB Prone hamstring curl 2x10 2.5# LAQ with 2.5# 3x10 each  [facility] Adult PT Treatment:  DATE: 02/12/2023 Therapeutic Exercise: Quad set 10 x 5 sec SLR x 10 Heel slide with strap 5 x 10 sec LAQ x 10  PATIENT EDUCATION:  Education details: HEP update Person educated: Patient and Spouse Education method: Explanation, Demonstration, Tactile cues, Verbal cues, and Handouts Education comprehension: verbalized understanding, returned demonstration, verbal cues required, tactile cues required, and needs further education  HOME EXERCISE PROGRAM: Access Code: [email or web address]   ASSESSMENT: CLINICAL IMPRESSION: Pt was able to complete all prescribed exercises with no adverse effect. Therapy today continued to work on improving strength and functional mobility. He continues to benefit from skilled PT services, will progress as tolerated.    OBJECTIVE IMPAIRMENTS: Abnormal gait, decreased activity tolerance, decreased balance, difficulty walking, decreased ROM, decreased strength, impaired flexibility, and pain.   ACTIVITY LIMITATIONS: carrying, lifting, standing, squatting, stairs, transfers, and locomotion level  PARTICIPATION LIMITATIONS: meal prep, cleaning, laundry, driving, shopping, community activity, and yard work  PERSONAL FACTORS: Fitness, Past/current experiences, and Time since onset of injury/illness/exacerbation are also  affecting patient's functional outcome.    GOALS: Goals reviewed with patient? Yes  SHORT TERM GOALS: Target date: 03/12/2023  Patient will be I with initial HEP in order to progress with therapy. Baseline: HEP provided at eval Goal status: INITIAL  2.  Patient will demonstrate right knee AROM 0-125 deg in order to improve his mobility Baseline: right knee AROM 8-103 deg Goal status: INITIAL  3.  Patient will report bilateral knee pain </= 3/10 in order to reduce functional limitations Baseline: 5-6/10 pain Goal status: INITIAL  LONG TERM GOALS: Target date: 04/09/2023  Patient will be I with final HEP to maintain progress from PT. Baseline: HEP provided at eval Goal status: INITIAL  2.  Patient will report >/= 46% status on FOTO to indicate improved functional ability. Baseline: 14% functional status Goal status: INITIAL  3.  Patient will demonstrate bilateral knee strength 5/5 MMT in order to improve his walking and activity tolerance.  Baseline: see limitations above Goal status: INITIAL  4.  Patient will be able to ambulate at community level with no limitations or AD in order to return to prior level of function Baseline: patient limited with ambulation at household level with RW Goal status: INITIAL   PLAN: PT FREQUENCY: 1-2x/week  PT DURATION: 8 weeks  PLANNED INTERVENTIONS: 97146- PT Re-evaluation, 97110-Therapeutic exercises, 97530- Therapeutic activity, 97112- Neuromuscular re-education, 97535- Self Care, 30865- Manual therapy, [id number]- Gait training, [phone]- Aquatic Therapy, 97014- Electrical stimulation (unattended), [phone]- Electrical stimulation (manual), Balance training, Stair training, Taping, Dry Needling, Joint mobilization, Joint manipulation, Cryotherapy, and Moist heat  PLAN FOR NEXT SESSION: Review HEP and progress PRN, gait training with RW at TTWB until follows-up with ortho, progress knee mobility and quad strengthening in open chain, hip  strengthening   Eloy End PT  02/28/23 10:59 AM

## 2023-03-05 ENCOUNTER — Encounter: Payer: Self-pay | Admitting: Physical Therapy

## 2023-03-05 ENCOUNTER — Ambulatory Visit: Payer: Federal, State, Local not specified - PPO | Attending: General Surgery | Admitting: Physical Therapy

## 2023-03-05 ENCOUNTER — Other Ambulatory Visit: Payer: Self-pay

## 2023-03-05 DIAGNOSIS — M6281 Muscle weakness (generalized): Secondary | ICD-10-CM | POA: Diagnosis not present

## 2023-03-05 DIAGNOSIS — M25561 Pain in right knee: Secondary | ICD-10-CM

## 2023-03-05 DIAGNOSIS — R2689 Other abnormalities of gait and mobility: Secondary | ICD-10-CM

## 2023-03-05 DIAGNOSIS — M25562 Pain in left knee: Secondary | ICD-10-CM | POA: Diagnosis not present

## 2023-03-05 NOTE — Therapy (Signed)
OUTPATIENT PHYSICAL THERAPY TREATMENT   Patient Name: Austin Ingram. MRN: [id number] DOB:27-Aug-1960, 62 y.o., male Today's Date: 03/05/2023   END OF SESSION:  PT End of Session - 03/05/23 1420     Visit Number 4    Number of Visits 17    Date for PT Re-Evaluation 04/09/23    Authorization Type BCBS    PT Start Time 1402    PT Stop Time 1440    PT Time Calculation (min) 38 min    Activity Tolerance Patient tolerated treatment well    Behavior During Therapy WFL for tasks assessed/performed                Past Medical History:  Diagnosis Date   Allergic rhinitis    Anxiety    Arthritis    "lower back, right thumb, knees" (10/04/2012)   Asthma    "grew out of it" (10/04/2012)   CKD (chronic kidney disease), stage II    Coronary artery disease    a. s/p prior stent to LAD;  b. LHC 6/14: DES to pRCA. c. DES to dCx 01/2017 with residual disease.   Depression    Hyperlipidemia    Hypertension    Ischemic cardiomyopathy    LV (left ventricular) mural thrombus    MI (myocardial infarction) (HCC) 03/2007   OSA (obstructive sleep apnea)    mild   Pneumonia    "as a child" (10/04/2012)   Sinus bradycardia    Splenic infarct    Past Surgical History:  Procedure Laterality Date   ARTHROPLASTY Right 1993   'crushed; removed bone fragments" (10/04/2012)   CARDIAC CATHETERIZATION  2010   CORONARY ANGIOPLASTY WITH STENT PLACEMENT  2008; 10/04/2012   "1 + 1" (10/04/2012)   CORONARY PRESSURE/FFR STUDY N/A 02/26/2017   Procedure: INTRAVASCULAR PRESSURE WIRE/FFR STUDY;  Surgeon: Kathleene Hazel, MD;  Location: [facility];  Service: Cardiovascular;  Laterality: N/A;   CORONARY STENT INTERVENTION N/A 02/26/2017   Procedure: CORONARY STENT INTERVENTION;  Surgeon: Kathleene Hazel, MD;  Location: [facility];  Service: Cardiovascular;  Laterality: N/A;   EXPLORATORY LAPAROTOMY  1990's?   "went in to repair hernia; found fatty tissue instead; no hernia  repair" (10/04/2012)   LEFT HEART CATH AND CORONARY ANGIOGRAPHY N/A 02/26/2017   Procedure: LEFT HEART CATH AND CORONARY ANGIOGRAPHY;  Surgeon: Kathleene Hazel, MD;  Location: [facility];  Service: Cardiovascular;  Laterality: N/A;   LEFT HEART CATH AND CORONARY ANGIOGRAPHY N/A 03/29/2021   Procedure: LEFT HEART CATH AND CORONARY ANGIOGRAPHY;  Surgeon: Swaziland, Peter M, MD;  Location: [facility] INVASIVE CV LAB;  Service: Cardiovascular;  Laterality: N/A;   LEFT HEART CATHETERIZATION WITH CORONARY ANGIOGRAM N/A 10/04/2012   Procedure: LEFT HEART CATHETERIZATION WITH CORONARY ANGIOGRAM;  Surgeon: Tonny Bollman, MD;  Location: [facility] CATH LAB;  Service: Cardiovascular;  Laterality: N/A;   Patient Active Problem List   Diagnosis Date Noted   Tibia fracture 01/26/2023   Long term (current) use of anticoagulants 06/07/2022   TIA (transient ischemic attack) 06/02/2022   Acute CVA (cerebrovascular accident) (HCC) 06/01/2022   Sinus bradycardia 06/01/2022   CKD (chronic kidney disease), stage II 06/01/2022   Cardiac akinesia 06/01/2022   LLQ pain 03/28/2021   BPH (benign prostatic hyperplasia) 03/28/2021   Spleen injury 03/28/2021   LV (left ventricular) mural thrombus 03/28/2021   Chronic combined systolic and diastolic heart failure (HCC) 03/28/2021   Abnormal CT scan, kidney 03/28/2021   NSTEMI (non-ST elevated  myocardial infarction) (HCC) 03/27/2021   Chest pain 03/06/2017   Chest pain with moderate risk for cardiac etiology    Unstable angina (HCC)    Ischemic cardiomyopathy 02/20/2017   Exertional angina (HCC) 10/05/2012   CAD (coronary artery disease) 12/19/2010   CIRCADIAN RHYTHM SLEEP DISORDER SHIFT WORK TYPE 03/10/2010   INADEQUATE SLEEP HYGIENE 08/20/2007   OSA (obstructive sleep apnea) 08/20/2007   Allergic rhinitis 08/20/2007   Asthma 08/20/2007   HLD (hyperlipidemia) 08/19/2007   HTN (hypertension) 08/19/2007   Acute myocardial infarction (HCC) 08/19/2007   Coronary  atherosclerosis 08/19/2007    PCP: Clovis Riley, L.[name], MD  REFERRING PROVIDER: Barnetta Chapel, [name]  REFERRING DIAG: Closed nondisplaced fracture of lateral condyle of right tibia, initial encounter  THERAPY DIAG:  Acute pain of right knee  Acute pain of left knee  Muscle weakness (generalized)  Other abnormalities of gait and mobility  Rationale for Evaluation and Treatment: Rehabilitation  ONSET DATE: 01/20/2023   SUBJECTIVE:  SUBJECTIVE STATEMENT: Patient reports he had some appointments this morning and had to walk so he states he is having pain today. He arrives using a SPC and a surgical shoe on the right. He states he has been getting unannounced pain in the right knee that come out of nowhere.   PERTINENT HISTORY: Right tibial plateau and proximal fibula head fracture, left fibular fracture  PAIN:  Are you having pain? Yes:  NPRS scale: 8/10 Pain location: Right knee Pain description: Uncomfortable Aggravating factors: Walking, activity  Relieving factors: Ice, medication  PRECAUTIONS: None  WEIGHT BEARING RESTRICTIONS: Yes right TTWB  PATIENT GOALS: Pain relief, get back to walking and prior level of function   OBJECTIVE:  Note: Objective measures were completed at Evaluation unless otherwise noted. PATIENT SURVEYS:  FOTO 14% functional status  EDEMA:  Patient does exhibit right lower leg edema compared to the left side   MUSCLE LENGTH: Limitation with bilateral hamstrings and calf  PALPATION: Generalized tenderness of right knee and lateral tenderness of left knee  LOWER EXTREMITY ROM:  Active ROM Right eval Left eval Right 02/26/2023 Rt / Lt 03/05/2023  Hip flexion      Hip extension      Hip abduction      Hip adduction      Hip internal rotation      Hip external rotation      Knee flexion 103 125 120 128 / 130  Knee extension Lacking 8 0 0   Ankle dorsiflexion      Ankle plantarflexion      Ankle inversion      Ankle eversion        (Blank rows = not tested)  LOWER EXTREMITY MMT:  MMT Right eval Left eval  Hip flexion 4 4  Hip extension    Hip abduction 2 3  Hip adduction    Hip internal rotation    Hip external rotation    Knee flexion 4- 4  Knee extension 4 4  Ankle dorsiflexion    Ankle plantarflexion    Ankle inversion    Ankle eversion     (Blank rows = not tested)  FUNCTIONAL TESTS:  Not assessed  GAIT: Assistive device utilized: Walker - 2 wheeled Level of assistance: Modified independence Comments: TTWB on right   TODAY'S TREATMENT [facility] Adult PT Treatment:  DATE: 03/05/2023 Therapeutic Exercise: SAQ x 10 with 3# 2 x 10 on right SLR 3 x 10 each Hooklying adductor ball squeeze 2 x 10 Hooklying clamshell with blue 2 x 15 Sidelying hip abduction 2 x 15 each LAQ 3 x 15 each with 5# on left and 3# on right Seated hamstring curl with blue 3 x 10 each Recumbent bike L3 x 5 min to improve LE endurance   [facility] Adult PT Treatment:                                                DATE: 02/28/2023 Therapeutic Exercise: Quad set 10 x 5 sec SAQ with 2.5# 2x10 each SLR 2x10 Bridge with ball 2x15 S/L clamshell 2x15 RTB Prone hamstring curl 2x10 2.5# LAQ with 2.5# 3x10 each  [facility] Adult PT Treatment:                                                DATE: 02/26/2023 Therapeutic Exercise: Quad set 10 x 5 sec SAQ with 2# 2 x 10 each SLR 2 x 10 Hooklying adductor ball squeeze 2 x 10 Sidelying hip abduction 2 x 10 each Bridge with bolster under legs 2 x 10 LAQ with 2# 3 x 10 each Seated hamstring curl with green 2 x 10 each Longsitting ankle PF with green 2 x 20 each Prone hamstring curl 2 x 10 each  [facility] Adult PT Treatment:                                                DATE: 02/12/2023 Therapeutic Exercise: Quad set 10 x 5 sec SLR x 10 Heel slide with strap 5 x 10 sec LAQ x 10  PATIENT EDUCATION:  Education details: HEP Person educated:  Patient Education method: Programmer, multimedia, Demonstration, Actor cues, Verbal cues Education comprehension: verbalized understanding, returned demonstration, verbal cues required, tactile cues required, and needs further education  HOME EXERCISE PROGRAM: Access Code: [email or web address]   ASSESSMENT: CLINICAL IMPRESSION: Patient tolerated therapy well with no adverse effects. Therapy continues to focus on progressing LE strength in non-weightbearing positions with good tolerance. Patient did arrived using [facility] and he was instructed that per MD note on 02/09/2023 he should remain TTWB using walker until he sees the doctor again on 03/08/2023. He seems to be progressing well with exercises in therapy and did not report any increase in pain with exercises, but did state he would be sore later. No changes made to HEP this visit. Patient would benefit from continued skilled PT to progress his mobility and strength in order to reduce pain and maximize functional ability.    OBJECTIVE IMPAIRMENTS: Abnormal gait, decreased activity tolerance, decreased balance, difficulty walking, decreased ROM, decreased strength, impaired flexibility, and pain.   ACTIVITY LIMITATIONS: carrying, lifting, standing, squatting, stairs, transfers, and locomotion level  PARTICIPATION LIMITATIONS: meal prep, cleaning, laundry, driving, shopping, community activity, and yard work  PERSONAL FACTORS: Fitness, Past/current experiences, and Time since onset of injury/illness/exacerbation are also affecting patient's functional outcome.    GOALS: Goals reviewed with patient? Yes  SHORT TERM GOALS: Target date:  03/12/2023  Patient will be I with initial HEP in order to progress with therapy. Baseline: HEP provided at eval Goal status: INITIAL  2.  Patient will demonstrate right knee AROM 0-125 deg in order to improve his mobility Baseline: right knee AROM 8-103 deg Goal status: INITIAL  3.  Patient will report bilateral knee pain </=  3/10 in order to reduce functional limitations Baseline: 5-6/10 pain Goal status: INITIAL  LONG TERM GOALS: Target date: 04/09/2023  Patient will be I with final HEP to maintain progress from PT. Baseline: HEP provided at eval Goal status: INITIAL  2.  Patient will report >/= 46% status on FOTO to indicate improved functional ability. Baseline: 14% functional status Goal status: INITIAL  3.  Patient will demonstrate bilateral knee strength 5/5 MMT in order to improve his walking and activity tolerance.  Baseline: see limitations above Goal status: INITIAL  4.  Patient will be able to ambulate at community level with no limitations or AD in order to return to prior level of function Baseline: patient limited with ambulation at household level with RW Goal status: INITIAL   PLAN: PT FREQUENCY: 1-2x/week  PT DURATION: 8 weeks  PLANNED INTERVENTIONS: 97146- PT Re-evaluation, 97110-Therapeutic exercises, 97530- Therapeutic activity, 97112- Neuromuscular re-education, 97535- Self Care, 13244- Manual therapy, [id number]- Gait training, [phone]- Aquatic Therapy, 97014- Electrical stimulation (unattended), [phone]- Electrical stimulation (manual), Balance training, Stair training, Taping, Dry Needling, Joint mobilization, Joint manipulation, Cryotherapy, and Moist heat  PLAN FOR NEXT SESSION: Review HEP and progress PRN, gait training with RW at TTWB until follows-up with ortho, progress knee mobility and quad strengthening in open chain, hip strengthening   Rosana Hoes, PT, DPT, LAT, ATC 03/05/23  2:44 PM Phone: [phone] Fax: [phone]

## 2023-03-06 ENCOUNTER — Ambulatory Visit (HOSPITAL_COMMUNITY)
Admission: RE | Admit: 2023-03-06 | Discharge: 2023-03-06 | Disposition: A | Discharge status: Home / Self Care | Payer: Federal, State, Local not specified - PPO | Source: Ambulatory Visit | Attending: Internal Medicine | Admitting: Internal Medicine

## 2023-03-06 ENCOUNTER — Other Ambulatory Visit (HOSPITAL_COMMUNITY): Payer: Self-pay

## 2023-03-06 ENCOUNTER — Telehealth (HOSPITAL_COMMUNITY): Payer: Self-pay

## 2023-03-06 VITALS — BP 132/68 | HR 50 | Wt 190.6 lb

## 2023-03-06 DIAGNOSIS — I255 Ischemic cardiomyopathy: Secondary | ICD-10-CM | POA: Insufficient documentation

## 2023-03-06 DIAGNOSIS — I5022 Chronic systolic (congestive) heart failure: Secondary | ICD-10-CM | POA: Diagnosis not present

## 2023-03-06 DIAGNOSIS — Z7984 Long term (current) use of oral hypoglycemic drugs: Secondary | ICD-10-CM | POA: Diagnosis not present

## 2023-03-06 DIAGNOSIS — Z8673 Personal history of transient ischemic attack (TIA), and cerebral infarction without residual deficits: Secondary | ICD-10-CM | POA: Insufficient documentation

## 2023-03-06 DIAGNOSIS — I2510 Atherosclerotic heart disease of native coronary artery without angina pectoris: Secondary | ICD-10-CM | POA: Diagnosis not present

## 2023-03-06 DIAGNOSIS — I252 Old myocardial infarction: Secondary | ICD-10-CM | POA: Insufficient documentation

## 2023-03-06 DIAGNOSIS — I110 Hypertensive heart disease with heart failure: Secondary | ICD-10-CM | POA: Diagnosis not present

## 2023-03-06 DIAGNOSIS — Z79899 Other long term (current) drug therapy: Secondary | ICD-10-CM | POA: Diagnosis not present

## 2023-03-06 DIAGNOSIS — Z7901 Long term (current) use of anticoagulants: Secondary | ICD-10-CM | POA: Diagnosis not present

## 2023-03-06 DIAGNOSIS — H524 Presbyopia: Secondary | ICD-10-CM | POA: Diagnosis not present

## 2023-03-06 MED ORDER — HYDRALAZINE HCL 50 MG PO TABS: 50.0000 mg | ORAL_TABLET | Freq: Three times a day (TID) | ORAL | 3 refills | Status: AC

## 2023-03-06 MED ORDER — ISOSORBIDE MONONITRATE ER 60 MG PO TB24: 90.0000 mg | ORAL_TABLET | Freq: Every day | ORAL | 3 refills | Status: AC

## 2023-03-06 MED ORDER — ENTRESTO 49-51 MG PO TABS: 1.0000 | ORAL_TABLET | Freq: Two times a day (BID) | ORAL | 3 refills | Status: AC

## 2023-03-06 NOTE — Telephone Encounter (Signed)
Advanced Heart Failure Patient Advocate Encounter  Patient has been enrolled in copay savings offer for Entresto.  BIN [id number] PCN [id number] GP [id number] ID [id number]  Patient provided with processing information in office.  Burnell Blanks, CPhT Rx Patient Advocate Phone: [phone]

## 2023-03-06 NOTE — Patient Instructions (Signed)
It was a pleasure seeing you today!  MEDICATIONS: -We are changing your medications today -Restart Entresto 49/51 mg (1 tablet) twice daily. You can use the copay card we activated today to decrease the cost. -Call if you have questions about your medications.   NEXT APPOINTMENT: Return to clinic in 3 weeks with [facility].  In general, to take care of your heart failure: -Limit your fluid intake to 2 Liters (half-gallon) per day.   -Limit your salt intake to ideally 2-3 grams (2000-3000 mg) per day. -Weigh yourself daily and record, and bring that "weight diary" to your next appointment.  (Weight gain of 2-3 pounds in 1 day typically means fluid weight.) -The medications for your heart are to help your heart and help you live longer.   -Please contact us before stopping any of your heart medications.  Call the clinic at [phone] with questions or to reschedule future appointments.

## 2023-03-07 ENCOUNTER — Other Ambulatory Visit: Payer: Self-pay

## 2023-03-07 ENCOUNTER — Encounter: Payer: Self-pay | Admitting: Physical Therapy

## 2023-03-07 ENCOUNTER — Ambulatory Visit: Payer: Federal, State, Local not specified - PPO | Admitting: Physical Therapy

## 2023-03-07 DIAGNOSIS — R2689 Other abnormalities of gait and mobility: Secondary | ICD-10-CM

## 2023-03-07 DIAGNOSIS — M25561 Pain in right knee: Secondary | ICD-10-CM

## 2023-03-07 DIAGNOSIS — M6281 Muscle weakness (generalized): Secondary | ICD-10-CM | POA: Diagnosis not present

## 2023-03-07 DIAGNOSIS — M25562 Pain in left knee: Secondary | ICD-10-CM

## 2023-03-07 NOTE — Therapy (Signed)
OUTPATIENT PHYSICAL THERAPY TREATMENT   Patient Name: Austin Ingram. MRN: [id number] DOB:Apr 30, 1961, 62 y.o., male Today's Date: 03/07/2023   END OF SESSION:  PT End of Session - 03/07/23 1319     Visit Number 5    Number of Visits 17    Date for PT Re-Evaluation 04/09/23    Authorization Type BCBS    PT Start Time 1315    PT Stop Time 1355    PT Time Calculation (min) 40 min    Activity Tolerance Patient tolerated treatment well    Behavior During Therapy WFL for tasks assessed/performed                 Past Medical History:  Diagnosis Date   Allergic rhinitis    Anxiety    Arthritis    "lower back, right thumb, knees" (10/04/2012)   Asthma    "grew out of it" (10/04/2012)   CKD (chronic kidney disease), stage II    Coronary artery disease    a. s/p prior stent to LAD;  b. LHC 6/14: DES to pRCA. c. DES to dCx 01/2017 with residual disease.   Depression    Hyperlipidemia    Hypertension    Ischemic cardiomyopathy    LV (left ventricular) mural thrombus    MI (myocardial infarction) (HCC) 03/2007   OSA (obstructive sleep apnea)    mild   Pneumonia    "as a child" (10/04/2012)   Sinus bradycardia    Splenic infarct    Past Surgical History:  Procedure Laterality Date   ARTHROPLASTY Right 1993   'crushed; removed bone fragments" (10/04/2012)   CARDIAC CATHETERIZATION  2010   CORONARY ANGIOPLASTY WITH STENT PLACEMENT  2008; 10/04/2012   "1 + 1" (10/04/2012)   CORONARY PRESSURE/FFR STUDY N/A 02/26/2017   Procedure: INTRAVASCULAR PRESSURE WIRE/FFR STUDY;  Surgeon: Kathleene Hazel, MD;  Location: [facility];  Service: Cardiovascular;  Laterality: N/A;   CORONARY STENT INTERVENTION N/A 02/26/2017   Procedure: CORONARY STENT INTERVENTION;  Surgeon: Kathleene Hazel, MD;  Location: [facility];  Service: Cardiovascular;  Laterality: N/A;   EXPLORATORY LAPAROTOMY  1990's?   "went in to repair hernia; found fatty tissue instead; no hernia  repair" (10/04/2012)   LEFT HEART CATH AND CORONARY ANGIOGRAPHY N/A 02/26/2017   Procedure: LEFT HEART CATH AND CORONARY ANGIOGRAPHY;  Surgeon: Kathleene Hazel, MD;  Location: [facility];  Service: Cardiovascular;  Laterality: N/A;   LEFT HEART CATH AND CORONARY ANGIOGRAPHY N/A 03/29/2021   Procedure: LEFT HEART CATH AND CORONARY ANGIOGRAPHY;  Surgeon: Swaziland, Peter M, MD;  Location: [facility] INVASIVE CV LAB;  Service: Cardiovascular;  Laterality: N/A;   LEFT HEART CATHETERIZATION WITH CORONARY ANGIOGRAM N/A 10/04/2012   Procedure: LEFT HEART CATHETERIZATION WITH CORONARY ANGIOGRAM;  Surgeon: Tonny Bollman, MD;  Location: [facility] CATH LAB;  Service: Cardiovascular;  Laterality: N/A;   Patient Active Problem List   Diagnosis Date Noted   Tibia fracture 01/26/2023   Long term (current) use of anticoagulants 06/07/2022   TIA (transient ischemic attack) 06/02/2022   Acute CVA (cerebrovascular accident) (HCC) 06/01/2022   Sinus bradycardia 06/01/2022   CKD (chronic kidney disease), stage II 06/01/2022   Cardiac akinesia 06/01/2022   LLQ pain 03/28/2021   BPH (benign prostatic hyperplasia) 03/28/2021   Spleen injury 03/28/2021   LV (left ventricular) mural thrombus 03/28/2021   Chronic combined systolic and diastolic heart failure (HCC) 03/28/2021   Abnormal CT scan, kidney 03/28/2021   NSTEMI (non-ST  elevated myocardial infarction) (HCC) 03/27/2021   Chest pain 03/06/2017   Chest pain with moderate risk for cardiac etiology    Unstable angina (HCC)    Ischemic cardiomyopathy 02/20/2017   Exertional angina (HCC) 10/05/2012   CAD (coronary artery disease) 12/19/2010   CIRCADIAN RHYTHM SLEEP DISORDER SHIFT WORK TYPE 03/10/2010   INADEQUATE SLEEP HYGIENE 08/20/2007   OSA (obstructive sleep apnea) 08/20/2007   Allergic rhinitis 08/20/2007   Asthma 08/20/2007   HLD (hyperlipidemia) 08/19/2007   HTN (hypertension) 08/19/2007   Acute myocardial infarction (HCC) 08/19/2007   Coronary  atherosclerosis 08/19/2007    PCP: Clovis Riley, L.[name], MD  REFERRING PROVIDER: Barnetta Chapel, [name]  REFERRING DIAG: Closed nondisplaced fracture of lateral condyle of right tibia, initial encounter  THERAPY DIAG:  Acute pain of right knee  Acute pain of left knee  Muscle weakness (generalized)  Other abnormalities of gait and mobility  Rationale for Evaluation and Treatment: Rehabilitation  ONSET DATE: 01/20/2023   SUBJECTIVE:  SUBJECTIVE STATEMENT: Patient reports he is feeling stiff behind both his knees today. He sees the doctor tomorrow, 03/08/23.  PERTINENT HISTORY: Right tibial plateau and proximal fibula head fracture, left fibular fracture  PAIN:  Are you having pain? Yes:  NPRS scale: 5/10 Pain location: Right knee Pain description: Stiff, uncomfortable Aggravating factors: Walking, activity  Relieving factors: Ice, medication  PRECAUTIONS: None  WEIGHT BEARING RESTRICTIONS: Yes right TTWB  PATIENT GOALS: Pain relief, get back to walking and prior level of function   OBJECTIVE:  Note: Objective measures were completed at Evaluation unless otherwise noted. PATIENT SURVEYS:  FOTO 14% functional status  EDEMA:  Patient does exhibit right lower leg edema compared to the left side   MUSCLE LENGTH: Limitation with bilateral hamstrings and calf  PALPATION: Generalized tenderness of right knee and lateral tenderness of left knee  LOWER EXTREMITY ROM:  Active ROM Right eval Left eval Right 02/26/2023 Rt / Lt 03/05/2023  Hip flexion      Hip extension      Hip abduction      Hip adduction      Hip internal rotation      Hip external rotation      Knee flexion 103 125 120 128 / 130  Knee extension Lacking 8 0 0   Ankle dorsiflexion      Ankle plantarflexion      Ankle inversion      Ankle eversion       (Blank rows = not tested)  LOWER EXTREMITY MMT:  MMT Right eval Left eval  Hip flexion 4 4  Hip extension    Hip abduction 2 3  Hip  adduction    Hip internal rotation    Hip external rotation    Knee flexion 4- 4  Knee extension 4 4  Ankle dorsiflexion    Ankle plantarflexion    Ankle inversion    Ankle eversion     (Blank rows = not tested)  FUNCTIONAL TESTS:  Not assessed  GAIT: Assistive device utilized: Walker - 2 wheeled Level of assistance: Modified independence Comments: TTWB on right   TODAY'S TREATMENT [facility] Adult PT Treatment:                                                DATE: 03/07/2023 Therapeutic Exercise: Recumbent bike L3 x 5 min to improve LE endurance Longsitting  calf stretch with strap 3 x 30 sec on right Supine hamstring stretch 3 x 20 sec on right SAQ 4# 3 x 10 on right SLR 3 x 10 on right Longsitting ankle PF and DF with blue 3 x 10 on right Bridge with legs on bolster 3 x 10 Sidelying hip abduction 3 x 15 each LAQ 3 x 15 each with 6# on left and 4# on right   [facility] Adult PT Treatment:                                                DATE: 03/05/2023 Therapeutic Exercise: SAQ x 10 with 3# 2 x 10 on right SLR 3 x 10 each Hooklying adductor ball squeeze 2 x 10 Hooklying clamshell with blue 2 x 15 Sidelying hip abduction 2 x 15 each LAQ 3 x 15 each with 5# on left and 3# on right Seated hamstring curl with blue 3 x 10 each Recumbent bike L3 x 5 min to improve LE endurance  [facility] Adult PT Treatment:                                                DATE: 02/28/2023 Therapeutic Exercise: Quad set 10 x 5 sec SAQ with 2.5# 2x10 each SLR 2x10 Bridge with ball 2x15 S/L clamshell 2x15 RTB Prone hamstring curl 2x10 2.5# LAQ with 2.5# 3x10 each  [facility] Adult PT Treatment:                                                DATE: 02/26/2023 Therapeutic Exercise: Quad set 10 x 5 sec SAQ with 2# 2 x 10 each SLR 2 x 10 Hooklying adductor ball squeeze 2 x 10 Sidelying hip abduction 2 x 10 each Bridge with bolster under legs 2 x 10 LAQ with 2# 3 x 10 each Seated hamstring curl with green 2 x 10  each Longsitting ankle PF with green 2 x 20 each Prone hamstring curl 2 x 10 each  [facility] Adult PT Treatment:                                                DATE: 02/12/2023 Therapeutic Exercise: Quad set 10 x 5 sec SLR x 10 Heel slide with strap 5 x 10 sec LAQ x 10  PATIENT EDUCATION:  Education details: HEP Person educated: Patient Education method: Programmer, multimedia, Demonstration, Actor cues, Verbal cues Education comprehension: verbalized understanding, returned demonstration, verbal cues required, tactile cues required, and needs further education  HOME EXERCISE PROGRAM: Access Code: [email or web address]   ASSESSMENT: CLINICAL IMPRESSION: Patient tolerated therapy well with no adverse effects. Therapy continues to focus on progressing LE and hip strengthening with good tolerance with emphasis on right knee strengthening. He seems to be progressing well and tolerates increased resistance with exercises. No increased pain with therapy but does fatigue with right hip and quad strengthening. No changes made to HEP this visit. He does see the doctor tomorrow  so will progress weight bearing and closed chain exercises as allowed. Patient would benefit from continued skilled PT to progress his mobility and strength in order to reduce pain and maximize functional ability.    OBJECTIVE IMPAIRMENTS: Abnormal gait, decreased activity tolerance, decreased balance, difficulty walking, decreased ROM, decreased strength, impaired flexibility, and pain.   ACTIVITY LIMITATIONS: carrying, lifting, standing, squatting, stairs, transfers, and locomotion level  PARTICIPATION LIMITATIONS: meal prep, cleaning, laundry, driving, shopping, community activity, and yard work  PERSONAL FACTORS: Fitness, Past/current experiences, and Time since onset of injury/illness/exacerbation are also affecting patient's functional outcome.    GOALS: Goals reviewed with patient? Yes  SHORT TERM GOALS: Target date:  03/12/2023  Patient will be I with initial HEP in order to progress with therapy. Baseline: HEP provided at eval Goal status: INITIAL  2.  Patient will demonstrate right knee AROM 0-125 deg in order to improve his mobility Baseline: right knee AROM 8-103 deg Goal status: INITIAL  3.  Patient will report bilateral knee pain </= 3/10 in order to reduce functional limitations Baseline: 5-6/10 pain Goal status: INITIAL  LONG TERM GOALS: Target date: 04/09/2023  Patient will be I with final HEP to maintain progress from PT. Baseline: HEP provided at eval Goal status: INITIAL  2.  Patient will report >/= 46% status on FOTO to indicate improved functional ability. Baseline: 14% functional status Goal status: INITIAL  3.  Patient will demonstrate bilateral knee strength 5/5 MMT in order to improve his walking and activity tolerance.  Baseline: see limitations above Goal status: INITIAL  4.  Patient will be able to ambulate at community level with no limitations or AD in order to return to prior level of function Baseline: patient limited with ambulation at household level with RW Goal status: INITIAL   PLAN: PT FREQUENCY: 1-2x/week  PT DURATION: 8 weeks  PLANNED INTERVENTIONS: 97146- PT Re-evaluation, 97110-Therapeutic exercises, 97530- Therapeutic activity, 97112- Neuromuscular re-education, 97535- Self Care, 65784- Manual therapy, [id number]- Gait training, [phone]- Aquatic Therapy, 97014- Electrical stimulation (unattended), [phone]- Electrical stimulation (manual), Balance training, Stair training, Taping, Dry Needling, Joint mobilization, Joint manipulation, Cryotherapy, and Moist heat  PLAN FOR NEXT SESSION: Review HEP and progress PRN, gait training with RW at TTWB until follows-up with ortho, progress knee mobility and quad strengthening in open chain, hip strengthening   Rosana Hoes, PT, DPT, LAT, ATC 03/07/23  2:01 PM Phone: [phone] Fax: [phone]

## 2023-03-08 DIAGNOSIS — S8781XA Crushing injury of right lower leg, initial encounter: Secondary | ICD-10-CM | POA: Diagnosis not present

## 2023-03-08 DIAGNOSIS — M25561 Pain in right knee: Secondary | ICD-10-CM | POA: Diagnosis not present

## 2023-03-08 DIAGNOSIS — M25562 Pain in left knee: Secondary | ICD-10-CM | POA: Diagnosis not present

## 2023-03-08 DIAGNOSIS — S8782XA Crushing injury of left lower leg, initial encounter: Secondary | ICD-10-CM | POA: Diagnosis not present

## 2023-03-12 ENCOUNTER — Other Ambulatory Visit: Payer: Self-pay

## 2023-03-12 ENCOUNTER — Ambulatory Visit: Payer: Federal, State, Local not specified - PPO | Admitting: Physical Therapy

## 2023-03-12 ENCOUNTER — Encounter: Payer: Self-pay | Admitting: Physical Therapy

## 2023-03-12 DIAGNOSIS — M6281 Muscle weakness (generalized): Secondary | ICD-10-CM

## 2023-03-12 DIAGNOSIS — M25561 Pain in right knee: Secondary | ICD-10-CM | POA: Diagnosis not present

## 2023-03-12 DIAGNOSIS — R2689 Other abnormalities of gait and mobility: Secondary | ICD-10-CM | POA: Diagnosis not present

## 2023-03-12 DIAGNOSIS — M25562 Pain in left knee: Secondary | ICD-10-CM

## 2023-03-12 NOTE — Therapy (Signed)
OUTPATIENT PHYSICAL THERAPY TREATMENT   Patient Name: Austin Ingram. MRN: [id number] DOB:Aug 02, 1960, 62 y.o., male Today's Date: 03/12/2023   END OF SESSION:  PT End of Session - 03/12/23 1326     Visit Number 6    Number of Visits 17    Date for PT Re-Evaluation 04/09/23    Authorization Type BCBS    PT Start Time 1319    PT Stop Time 1400    PT Time Calculation (min) 41 min    Activity Tolerance Patient tolerated treatment well    Behavior During Therapy WFL for tasks assessed/performed                  Past Medical History:  Diagnosis Date   Allergic rhinitis    Anxiety    Arthritis    "lower back, right thumb, knees" (10/04/2012)   Asthma    "grew out of it" (10/04/2012)   CKD (chronic kidney disease), stage II    Coronary artery disease    a. s/p prior stent to LAD;  b. LHC 6/14: DES to pRCA. c. DES to dCx 01/2017 with residual disease.   Depression    Hyperlipidemia    Hypertension    Ischemic cardiomyopathy    LV (left ventricular) mural thrombus    MI (myocardial infarction) (HCC) 03/2007   OSA (obstructive sleep apnea)    mild   Pneumonia    "as a child" (10/04/2012)   Sinus bradycardia    Splenic infarct    Past Surgical History:  Procedure Laterality Date   ARTHROPLASTY Right 1993   'crushed; removed bone fragments" (10/04/2012)   CARDIAC CATHETERIZATION  2010   CORONARY ANGIOPLASTY WITH STENT PLACEMENT  2008; 10/04/2012   "1 + 1" (10/04/2012)   CORONARY PRESSURE/FFR STUDY N/A 02/26/2017   Procedure: INTRAVASCULAR PRESSURE WIRE/FFR STUDY;  Surgeon: Kathleene Hazel, MD;  Location: [facility];  Service: Cardiovascular;  Laterality: N/A;   CORONARY STENT INTERVENTION N/A 02/26/2017   Procedure: CORONARY STENT INTERVENTION;  Surgeon: Kathleene Hazel, MD;  Location: [facility];  Service: Cardiovascular;  Laterality: N/A;   EXPLORATORY LAPAROTOMY  1990's?   "went in to repair hernia; found fatty tissue instead; no hernia  repair" (10/04/2012)   LEFT HEART CATH AND CORONARY ANGIOGRAPHY N/A 02/26/2017   Procedure: LEFT HEART CATH AND CORONARY ANGIOGRAPHY;  Surgeon: Kathleene Hazel, MD;  Location: [facility];  Service: Cardiovascular;  Laterality: N/A;   LEFT HEART CATH AND CORONARY ANGIOGRAPHY N/A 03/29/2021   Procedure: LEFT HEART CATH AND CORONARY ANGIOGRAPHY;  Surgeon: Swazil[place], Peter M, MD;  Location: [facility] INVASIVE CV LAB;  Service: Cardiovascular;  Laterality: N/A;   LEFT HEART CATHETERIZATION WITH CORONARY ANGIOGRAM N/A 10/04/2012   Procedure: LEFT HEART CATHETERIZATION WITH CORONARY ANGIOGRAM;  Surgeon: Tonny Bollman, MD;  Location: [facility] CATH LAB;  Service: Cardiovascular;  Laterality: N/A;   Patient Active Problem List   Diagnosis Date Noted   Tibia fracture 01/26/2023   Long term (current) use of anticoagulants 06/07/2022   TIA (transient ischemic attack) 06/02/2022   Acute CVA (cerebrovascular accident) (HCC) 06/01/2022   Sinus bradycardia 06/01/2022   CKD (chronic kidney disease), stage II 06/01/2022   Cardiac akinesia 06/01/2022   LLQ pain 03/28/2021   BPH (benign prostatic hyperplasia) 03/28/2021   Spleen injury 03/28/2021   LV (left ventricular) mural thrombus 03/28/2021   Chronic combined systolic and diastolic heart failure (HCC) 03/28/2021   Abnormal CT scan, kidney 03/28/2021   NSTEMI (  non-ST elevated myocardial infarction) (HCC) 03/27/2021   Chest pain 03/06/2017   Chest pain with moderate risk for cardiac etiology    Unstable angina (HCC)    Ischemic cardiomyopathy 02/20/2017   Exertional angina (HCC) 10/05/2012   CAD (coronary artery disease) 12/19/2010   CIRCADIAN RHYTHM SLEEP DISORDER SHIFT WORK TYPE 03/10/2010   INADEQUATE SLEEP HYGIENE 08/20/2007   OSA (obstructive sleep apnea) 08/20/2007   Allergic rhinitis 08/20/2007   Asthma 08/20/2007   HLD (hyperlipidemia) 08/19/2007   HTN (hypertension) 08/19/2007   Acute myocardial infarction (HCC) 08/19/2007   Coronary  atherosclerosis 08/19/2007    PCP: Clovis Riley, L.[name], MD  REFERRING PROVIDER: Barnetta Chapel, [name]  REFERRING DIAG: Closed nondisplaced fracture of lateral condyle of right tibia, initial encounter  THERAPY DIAG:  Acute pain of right knee  Acute pain of left knee  Muscle weakness (generalized)  Other abnormalities of gait and mobility  Rationale for Evaluation and Treatment: Rehabilitation  ONSET DATE: 01/20/2023   SUBJECTIVE:  SUBJECTIVE STATEMENT: Patient reports he saw the surgeon and has no more restrictions and can use the can and surgical shoe on the right as needed for comfort.   PERTINENT HISTORY: Right tibial plateau and proximal fibula head fracture, left fibular fracture  PAIN:  Are you having pain? Yes:  NPRS scale: 5/10 right, 3/10 left Pain location: Bilateral knee Pain description: Stiff, uncomfortable Aggravating factors: Walking, activity  Relieving factors: Ice, medication  PRECAUTIONS: None  WEIGHT BEARING RESTRICTIONS: WBAT per patient  PATIENT GOALS: Pain relief, get back to walking and prior level of function   OBJECTIVE:  Note: Objective measures were completed at Evaluation unless otherwise noted. PATIENT SURVEYS:  FOTO 14% functional status  03/12/2023: 3%  EDEMA:  Patient does exhibit right lower leg edema compared to the left side   MUSCLE LENGTH: Limitation with bilateral hamstrings and calf  PALPATION: Generalized tenderness of right knee and lateral tenderness of left knee  LOWER EXTREMITY ROM:  Active ROM Right eval Left eval Right 02/26/2023 Rt / Lt 03/05/2023  Hip flexion      Hip extension      Hip abduction      Hip adduction      Hip internal rotation      Hip external rotation      Knee flexion 103 125 120 128 / 130  Knee extension Lacking 8 0 0   Ankle dorsiflexion      Ankle plantarflexion      Ankle inversion      Ankle eversion       (Blank rows = not tested)  LOWER EXTREMITY MMT:  MMT Right eval  Left eval  Hip flexion 4 4  Hip extension    Hip abduction 2 3  Hip adduction    Hip internal rotation    Hip external rotation    Knee flexion 4- 4  Knee extension 4 4  Ankle dorsiflexion    Ankle plantarflexion    Ankle inversion    Ankle eversion     (Blank rows = not tested)  FUNCTIONAL TESTS:  Not assessed  GAIT: Assistive device utilized: Walker - 2 wheeled Level of assistance: Modified independence Comments: TTWB on right  03/12/2023: WBAT on right using SPC   TODAY'S TREATMENT [facility] Adult PT Treatment:  DATE: 03/12/2023 Therapeutic Exercise: NuStep L6 x 5 min with LE only to improve LE strength and endurance SLR 3 x 10 each Bridge 3 x 10 Sidelying hip abduction 3 x 15 each Sit to stand from elevated mat table 2 x 10 Trialed knee extension machine but patient unable to perform LAQ 3 x 10 with 5# each Leg press (BATCA) 20# 3 x 10   [facility] Adult PT Treatment:                                                DATE: 03/07/2023 Therapeutic Exercise: Recumbent bike L3 x 5 min to improve LE endurance Longsitting calf stretch with strap 3 x 30 sec on right Supine hamstring stretch 3 x 20 sec on right SAQ 4# 3 x 10 on right SLR 3 x 10 on right Longsitting ankle PF and DF with blue 3 x 10 on right Bridge with legs on bolster 3 x 10 Sidelying hip abduction 3 x 15 each LAQ 3 x 15 each with 6# on left and 4# on right  [facility] Adult PT Treatment:                                                DATE: 03/05/2023 Therapeutic Exercise: SAQ x 10 with 3# 2 x 10 on right SLR 3 x 10 each Hooklying adductor ball squeeze 2 x 10 Hooklying clamshell with blue 2 x 15 Sidelying hip abduction 2 x 15 each LAQ 3 x 15 each with 5# on left and 3# on right Seated hamstring curl with blue 3 x 10 each Recumbent bike L3 x 5 min to improve LE endurance  [facility] Adult PT Treatment:                                                DATE:  02/28/2023 Therapeutic Exercise: Quad set 10 x 5 sec SAQ with 2.5# 2x10 each SLR 2x10 Bridge with ball 2x15 S/L clamshell 2x15 RTB Prone hamstring curl 2x10 2.5# LAQ with 2.5# 3x10 each  [facility] Adult PT Treatment:                                                DATE: 02/26/2023 Therapeutic Exercise: Quad set 10 x 5 sec SAQ with 2# 2 x 10 each SLR 2 x 10 Hooklying adductor ball squeeze 2 x 10 Sidelying hip abduction 2 x 10 each Bridge with bolster under legs 2 x 10 LAQ with 2# 3 x 10 each Seated hamstring curl with green 2 x 10 each Longsitting ankle PF with green 2 x 20 each Prone hamstring curl 2 x 10 each  [facility] Adult PT Treatment:                                                DATE: 02/12/2023  Therapeutic Exercise: Quad set 10 x 5 sec SLR x 10 Heel slide with strap 5 x 10 sec LAQ x 10  PATIENT EDUCATION:  Education details: HEP Person educated: Patient Education method: Explanation, Demonstration, Actor cues, Verbal cues Education comprehension: verbalized understanding, returned demonstration, verbal cues required, tactile cues required, and needs further education  HOME EXERCISE PROGRAM: Access Code: [email or web address]   ASSESSMENT: CLINICAL IMPRESSION: Patient tolerated therapy well with no adverse effects. He reports following up with his doctor and is now WBAT. Therapy continued focus on progressing LE strengthening and incorporated more weight bearing and closed chain exercises with good tolerance. He does continue to exhibit gross strength deficits of bilateral LE and did report a reduction in functional status on FOTO this visit. No changes made to HEP this visit. Patient would benefit from continued skilled PT to progress his mobility and strength in order to reduce pain and maximize functional ability.    OBJECTIVE IMPAIRMENTS: Abnormal gait, decreased activity tolerance, decreased balance, difficulty walking, decreased ROM, decreased strength, impaired flexibility, and  pain.   ACTIVITY LIMITATIONS: carrying, lifting, standing, squatting, stairs, transfers, and locomotion level  PARTICIPATION LIMITATIONS: meal prep, cleaning, laundry, driving, shopping, community activity, and yard work  PERSONAL FACTORS: Fitness, Past/current experiences, and Time since onset of injury/illness/exacerbation are also affecting patient's functional outcome.    GOALS: Goals reviewed with patient? Yes  SHORT TERM GOALS: Target date: 03/12/2023  Patient will be I with initial HEP in order to progress with therapy. Baseline: HEP provided at eval 03/12/2023: independent Goal status: MET  2.  Patient will demonstrate right knee AROM 0-125 deg in order to improve his mobility Baseline: right knee AROM 8-103 deg 03/12/2023: 0-128 deg Goal status: MET  3.  Patient will report bilateral knee pain </= 3/10 in order to reduce functional limitations Baseline: 5-6/10 pain 03/12/2023: 3-5/10 pain Goal status: ONGOING  LONG TERM GOALS: Target date: 04/09/2023  Patient will be I with final HEP to maintain progress from PT. Baseline: HEP provided at eval Goal status: INITIAL  2.  Patient will report >/= 46% status on FOTO to indicate improved functional ability. Baseline: 14% functional status 03/12/2023: 3% Goal status: ONGOING  3.  Patient will demonstrate bilateral knee strength 5/5 MMT in order to improve his walking and activity tolerance.  Baseline: see limitations above Goal status: INITIAL  4.  Patient will be able to ambulate at community level with no limitations or AD in order to return to prior level of function Baseline: patient limited with ambulation at household level with RW Goal status: INITIAL   PLAN: PT FREQUENCY: 1-2x/week  PT DURATION: 8 weeks  PLANNED INTERVENTIONS: 97146- PT Re-evaluation, 97110-Therapeutic exercises, 97530- Therapeutic activity, 97112- Neuromuscular re-education, 97535- Self Care, 47829- Manual therapy, [id number]- Gait training,  [phone]- Aquatic Therapy, 97014- Electrical stimulation (unattended), [phone]- Electrical stimulation (manual), Balance training, Stair training, Taping, Dry Needling, Joint mobilization, Joint manipulation, Cryotherapy, and Moist heat  PLAN FOR NEXT SESSION: Review HEP and progress PRN, gait training using cane, progress knee mobility and quad strengthening in open chain, hip strengthening   Rosana Hoes, PT, DPT, LAT, ATC 03/12/23  2:03 PM Phone: [phone] Fax: [phone]

## 2023-03-14 ENCOUNTER — Encounter: Payer: Self-pay | Admitting: Physical Therapy

## 2023-03-14 ENCOUNTER — Other Ambulatory Visit: Payer: Self-pay

## 2023-03-14 ENCOUNTER — Ambulatory Visit: Payer: Federal, State, Local not specified - PPO | Admitting: Physical Therapy

## 2023-03-14 DIAGNOSIS — M25561 Pain in right knee: Secondary | ICD-10-CM | POA: Diagnosis not present

## 2023-03-14 DIAGNOSIS — M25562 Pain in left knee: Secondary | ICD-10-CM | POA: Diagnosis not present

## 2023-03-14 DIAGNOSIS — M6281 Muscle weakness (generalized): Secondary | ICD-10-CM

## 2023-03-14 DIAGNOSIS — R2689 Other abnormalities of gait and mobility: Secondary | ICD-10-CM | POA: Diagnosis not present

## 2023-03-14 NOTE — Therapy (Signed)
OUTPATIENT PHYSICAL THERAPY TREATMENT   Patient Name: Austin Ingram. MRN: [id number] DOB:[birth year], 62 y.o., male Today's Date: 03/14/2023   END OF SESSION:  PT End of Session - 03/14/23 1319     Visit Number 7    Number of Visits 17    Date for PT Re-Evaluation 04/09/23    Authorization Type BCBS    PT Start Time 1315    PT Stop Time 1355    PT Time Calculation (min) 40 min    Activity Tolerance Patient tolerated treatment well    Behavior During Therapy WFL for tasks assessed/performed                   Past Medical History:  Diagnosis Date   Allergic rhinitis    Anxiety    Arthritis    "lower back, right thumb, knees" (10/04/2012)   Asthma    "grew out of it" (10/04/2012)   CKD (chronic kidney disease), stage II    Coronary artery disease    a. s/p prior stent to LAD;  b. LHC 6/14: DES to pRCA. c. DES to dCx 01/2017 with residual disease.   Depression    Hyperlipidemia    Hypertension    Ischemic cardiomyopathy    LV (left ventricular) mural thrombus    MI (myocardial infarction) (HCC) 03/2007   OSA (obstructive sleep apnea)    mild   Pneumonia    "as a child" (10/04/2012)   Sinus bradycardia    Splenic infarct    Past Surgical History:  Procedure Laterality Date   ARTHROPLASTY Right 1993   'crushed; removed bone fragments" (10/04/2012)   CARDIAC CATHETERIZATION  2010   CORONARY ANGIOPLASTY WITH STENT PLACEMENT  2008; 10/04/2012   "1 + 1" (10/04/2012)   CORONARY PRESSURE/FFR STUDY N/A 02/26/2017   Procedure: INTRAVASCULAR PRESSURE WIRE/FFR STUDY;  Surgeon: Kathleene Hazel, MD;  Location: [facility];  Service: Cardiovascular;  Laterality: N/A;   CORONARY STENT INTERVENTION N/A 02/26/2017   Procedure: CORONARY STENT INTERVENTION;  Surgeon: Kathleene Hazel, MD;  Location: [facility];  Service: Cardiovascular;  Laterality: N/A;   EXPLORATORY LAPAROTOMY  1990's?   "went in to repair hernia; found fatty tissue instead; no hernia  repair" (10/04/2012)   LEFT HEART CATH AND CORONARY ANGIOGRAPHY N/A 02/26/2017   Procedure: LEFT HEART CATH AND CORONARY ANGIOGRAPHY;  Surgeon: Kathleene Hazel, MD;  Location: [facility];  Service: Cardiovascular;  Laterality: N/A;   LEFT HEART CATH AND CORONARY ANGIOGRAPHY N/A 03/29/2021   Procedure: LEFT HEART CATH AND CORONARY ANGIOGRAPHY;  Surgeon: Swazil[place], Peter M, MD;  Location: [facility] INVASIVE CV LAB;  Service: Cardiovascular;  Laterality: N/A;   LEFT HEART CATHETERIZATION WITH CORONARY ANGIOGRAM N/A 10/04/2012   Procedure: LEFT HEART CATHETERIZATION WITH CORONARY ANGIOGRAM;  Surgeon: Tonny Bollman, MD;  Location: [facility] CATH LAB;  Service: Cardiovascular;  Laterality: N/A;   Patient Active Problem List   Diagnosis Date Noted   Tibia fracture 01/26/2023   Long term (current) use of anticoagulants 06/07/2022   TIA (transient ischemic attack) 06/02/2022   Acute CVA (cerebrovascular accident) (HCC) 06/01/2022   Sinus bradycardia 06/01/2022   CKD (chronic kidney disease), stage II 06/01/2022   Cardiac akinesia 06/01/2022   LLQ pain 03/28/2021   BPH (benign prostatic hyperplasia) 03/28/2021   Spleen injury 03/28/2021   LV (left ventricular) mural thrombus 03/28/2021   Chronic combined systolic and diastolic heart failure (HCC) 03/28/2021   Abnormal CT scan, kidney 03/28/2021  NSTEMI (non-ST elevated myocardial infarction) (HCC) 03/27/2021   Chest pain 03/06/2017   Chest pain with moderate risk for cardiac etiology    Unstable angina (HCC)    Ischemic cardiomyopathy 02/20/2017   Exertional angina (HCC) 10/05/2012   CAD (coronary artery disease) 12/19/2010   CIRCADIAN RHYTHM SLEEP DISORDER SHIFT WORK TYPE 03/10/2010   INADEQUATE SLEEP HYGIENE 08/20/2007   OSA (obstructive sleep apnea) 08/20/2007   Allergic rhinitis 08/20/2007   Asthma 08/20/2007   HLD (hyperlipidemia) 08/19/2007   HTN (hypertension) 08/19/2007   Acute myocardial infarction (HCC) 08/19/2007   Coronary  atherosclerosis 08/19/2007    PCP: Clovis Riley, L.[name], MD  REFERRING PROVIDER: Barnetta Chapel, [name]  REFERRING DIAG: Closed nondisplaced fracture of lateral condyle of right tibia, initial encounter  THERAPY DIAG:  Acute pain of right knee  Acute pain of left knee  Muscle weakness (generalized)  Other abnormalities of gait and mobility  Rationale for Evaluation and Treatment: Rehabilitation  ONSET DATE: 01/20/2023   SUBJECTIVE:  SUBJECTIVE STATEMENT: Patient reports a little more soreness today. Having more pain on the outside of the left knee today.   PERTINENT HISTORY: Right tibial plateau and proximal fibula head fracture, left fibular fracture  PAIN:  Are you having pain? Yes:  NPRS scale: 6/10 Pain location: Bilateral knee Pain description: Stiff, uncomfortable Aggravating factors: Walking, activity  Relieving factors: Ice, medication  PRECAUTIONS: None  WEIGHT BEARING RESTRICTIONS: WBAT per patient  PATIENT GOALS: Pain relief, get back to walking and prior level of function   OBJECTIVE:  Note: Objective measures were completed at Evaluation unless otherwise noted. PATIENT SURVEYS:  FOTO 14% functional status  03/12/2023: 3%  EDEMA:  Patient does exhibit right lower leg edema compared to the left side   MUSCLE LENGTH: Limitation with bilateral hamstrings and calf  PALPATION: Generalized tenderness of right knee and lateral tenderness of left knee  LOWER EXTREMITY ROM:  Active ROM Right eval Left eval Right 02/26/2023 Rt / Lt 03/05/2023  Hip flexion      Hip extension      Hip abduction      Hip adduction      Hip internal rotation      Hip external rotation      Knee flexion 103 125 120 128 / 130  Knee extension Lacking 8 0 0   Ankle dorsiflexion      Ankle plantarflexion      Ankle inversion      Ankle eversion       (Blank rows = not tested)  LOWER EXTREMITY MMT:  MMT Right eval Left eval  Hip flexion 4 4  Hip extension    Hip  abduction 2 3  Hip adduction    Hip internal rotation    Hip external rotation    Knee flexion 4- 4  Knee extension 4 4  Ankle dorsiflexion    Ankle plantarflexion    Ankle inversion    Ankle eversion     (Blank rows = not tested)  FUNCTIONAL TESTS:  Not assessed  GAIT: Assistive device utilized: Walker - 2 wheeled Level of assistance: Modified independence Comments: TTWB on right  03/12/2023: WBAT on right using SPC   TODAY'S TREATMENT [facility] Adult PT Treatment:                                                DATE:  03/14/2023 Therapeutic Exercise: Recumbent bike L4 x 5 min to improve LE endurance Leg press (cybex) 40# x 10, 60# 3 x 10 Bridge 3 x 10 SLR 2 x 10 each LAQ 3 x 10 with 5# each Longsitting ankle PF with blue 2 x 20 Standing hip abduction with blue at knees 3 x 15 each   [facility] Adult PT Treatment:                                                DATE: 03/12/2023 Therapeutic Exercise: NuStep L6 x 5 min with LE only to improve LE strength and endurance SLR 3 x 10 each Bridge 3 x 10 Sidelying hip abduction 3 x 15 each Sit to stand from elevated mat table 2 x 10 Trialed knee extension machine but patient unable to perform LAQ 3 x 10 with 5# each Leg press (BATCA) 20# 3 x 10  [facility] Adult PT Treatment:                                                DATE: 03/07/2023 Therapeutic Exercise: Recumbent bike L3 x 5 min to improve LE endurance Longsitting calf stretch with strap 3 x 30 sec on right Supine hamstring stretch 3 x 20 sec on right SAQ 4# 3 x 10 on right SLR 3 x 10 on right Longsitting ankle PF and DF with blue 3 x 10 on right Bridge with legs on bolster 3 x 10 Sidelying hip abduction 3 x 15 each LAQ 3 x 15 each with 6# on left and 4# on right  [facility] Adult PT Treatment:                                                DATE: 03/05/2023 Therapeutic Exercise: SAQ x 10 with 3# 2 x 10 on right SLR 3 x 10 each Hooklying adductor ball squeeze 2 x 10 Hooklying  clamshell with blue 2 x 15 Sidelying hip abduction 2 x 15 each LAQ 3 x 15 each with 5# on left and 3# on right Seated hamstring curl with blue 3 x 10 each Recumbent bike L3 x 5 min to improve LE endurance  [facility] Adult PT Treatment:                                                DATE: 02/28/2023 Therapeutic Exercise: Quad set 10 x 5 sec SAQ with 2.5# 2x10 each SLR 2x10 Bridge with ball 2x15 S/L clamshell 2x15 RTB Prone hamstring curl 2x10 2.5# LAQ with 2.5# 3x10 each  PATIENT EDUCATION:  Education details: HEP update Person educated: Patient Education method: Explanation, Demonstration, Tactile cues, Verbal cues, Handout Education comprehension: verbalized understanding, returned demonstration, verbal cues required, tactile cues required, and needs further education  HOME EXERCISE PROGRAM: Access Code: [email or web address]   ASSESSMENT: CLINICAL IMPRESSION: Patient tolerated therapy well with no adverse effects. Therapy continues to focus on progressing LE strengthening with good tolerance. He was able to  progress with weighted resistance and standing exercises. Updated HEP to progress strengthening for home. Patient would benefit from continued skilled PT to progress his mobility and strength in order to reduce pain and maximize functional ability.    OBJECTIVE IMPAIRMENTS: Abnormal gait, decreased activity tolerance, decreased balance, difficulty walking, decreased ROM, decreased strength, impaired flexibility, and pain.   ACTIVITY LIMITATIONS: carrying, lifting, standing, squatting, stairs, transfers, and locomotion level  PARTICIPATION LIMITATIONS: meal prep, cleaning, laundry, driving, shopping, community activity, and yard work  PERSONAL FACTORS: Fitness, Past/current experiences, and Time since onset of injury/illness/exacerbation are also affecting patient's functional outcome.    GOALS: Goals reviewed with patient? Yes  SHORT TERM GOALS: Target date: 03/12/2023  Patient will  be I with initial HEP in order to progress with therapy. Baseline: HEP provided at eval 03/12/2023: independent Goal status: MET  2.  Patient will demonstrate right knee AROM 0-125 deg in order to improve his mobility Baseline: right knee AROM 8-103 deg 03/12/2023: 0-128 deg Goal status: MET  3.  Patient will report bilateral knee pain </= 3/10 in order to reduce functional limitations Baseline: 5-6/10 pain 03/12/2023: 3-5/10 pain Goal status: ONGOING  LONG TERM GOALS: Target date: 04/09/2023  Patient will be I with final HEP to maintain progress from PT. Baseline: HEP provided at eval Goal status: INITIAL  2.  Patient will report >/= 46% status on FOTO to indicate improved functional ability. Baseline: 14% functional status 03/12/2023: 3% Goal status: ONGOING  3.  Patient will demonstrate bilateral knee strength 5/5 MMT in order to improve his walking and activity tolerance.  Baseline: see limitations above Goal status: INITIAL  4.  Patient will be able to ambulate at community level with no limitations or AD in order to return to prior level of function Baseline: patient limited with ambulation at household level with RW Goal status: INITIAL   PLAN: PT FREQUENCY: 1-2x/week  PT DURATION: 8 weeks  PLANNED INTERVENTIONS: 97146- PT Re-evaluation, 97110-Therapeutic exercises, 97530- Therapeutic activity, 97112- Neuromuscular re-education, 97535- Self Care, 16109- Manual therapy, [id number]- Gait training, [phone]- Aquatic Therapy, 97014- Electrical stimulation (unattended), [phone]- Electrical stimulation (manual), Balance training, Stair training, Taping, Dry Needling, Joint mobilization, Joint manipulation, Cryotherapy, and Moist heat  PLAN FOR NEXT SESSION: Review HEP and progress PRN, gait training using cane, progress knee mobility and quad strengthening in open chain, hip strengthening   Rosana Hoes, PT, DPT, LAT, ATC 03/14/23  1:59 PM Phone: [phone] Fax:  [phone]

## 2023-03-14 NOTE — Patient Instructions (Signed)
Access Code: [email or web address] URL: [email or web address] Date: 03/14/2023 Prepared by: Rosana Hoes  Exercises - Active Straight Leg Raise with Quad Set  - 1 x daily - 3 sets - 10 reps - Supine Bridge  - 1 x daily - 3 sets - 10 reps - Long Sitting Ankle Plantar Flexion with Resistance  - 1 x daily - 3 sets - 20 reps - Sidelying Hip Abduction  - 1 x daily - 3 sets - 15 reps - Seated Long Arc Quad  - 1 x daily - 3 sets - 20 reps - Sit to Stand Without Arm Support  - 1 x daily - 3 sets - 10 reps

## 2023-03-19 ENCOUNTER — Other Ambulatory Visit: Payer: Self-pay

## 2023-03-19 ENCOUNTER — Ambulatory Visit: Payer: Federal, State, Local not specified - PPO | Admitting: Physical Therapy

## 2023-03-19 ENCOUNTER — Encounter: Payer: Self-pay | Admitting: Physical Therapy

## 2023-03-19 DIAGNOSIS — M25561 Pain in right knee: Secondary | ICD-10-CM

## 2023-03-19 DIAGNOSIS — M25562 Pain in left knee: Secondary | ICD-10-CM

## 2023-03-19 DIAGNOSIS — M6281 Muscle weakness (generalized): Secondary | ICD-10-CM

## 2023-03-19 DIAGNOSIS — R2689 Other abnormalities of gait and mobility: Secondary | ICD-10-CM

## 2023-03-19 NOTE — Therapy (Signed)
OUTPATIENT PHYSICAL THERAPY TREATMENT   Patient Name: Austin Ingram. MRN: [id number] DOB:17-Oct-1960, 62 y.o., male Today's Date: 03/19/2023   END OF SESSION:  PT End of Session - 03/19/23 1322     Visit Number 8    Number of Visits 17    Date for PT Re-Evaluation 04/09/23    Authorization Type BCBS    PT Start Time 1315    PT Stop Time 1355    PT Time Calculation (min) 40 min    Activity Tolerance Patient tolerated treatment well    Behavior During Therapy WFL for tasks assessed/performed                    Past Medical History:  Diagnosis Date   Allergic rhinitis    Anxiety    Arthritis    "lower back, right thumb, knees" (10/04/2012)   Asthma    "grew out of it" (10/04/2012)   CKD (chronic kidney disease), stage II    Coronary artery disease    a. s/p prior stent to LAD;  b. LHC 6/14: DES to pRCA. c. DES to dCx 01/2017 with residual disease.   Depression    Hyperlipidemia    Hypertension    Ischemic cardiomyopathy    LV (left ventricular) mural thrombus    MI (myocardial infarction) (HCC) 03/2007   OSA (obstructive sleep apnea)    mild   Pneumonia    "as a child" (10/04/2012)   Sinus bradycardia    Splenic infarct    Past Surgical History:  Procedure Laterality Date   ARTHROPLASTY Right 1993   'crushed; removed bone fragments" (10/04/2012)   CARDIAC CATHETERIZATION  2010   CORONARY ANGIOPLASTY WITH STENT PLACEMENT  2008; 10/04/2012   "1 + 1" (10/04/2012)   CORONARY PRESSURE/FFR STUDY N/A 02/26/2017   Procedure: INTRAVASCULAR PRESSURE WIRE/FFR STUDY;  Surgeon: Kathleene Hazel, MD;  Location: [facility] INV[facility] CV [facility];  Service: Cardiovascular;  Laterality: N/A;   CORONARY STENT INTERVENTION N/A 02/26/2017   Procedure: CORONARY STENT INTERVENTION;  Surgeon: Kathleene Hazel, MD;  Location: [facility];  Service: Cardiovascular;  Laterality: N/A;   EXPLORATORY LAPAROTOMY  1990's?   "went in to repair hernia; found fatty tissue instead; no  hernia repair" (10/04/2012)   LEFT HEART CATH AND CORONARY ANGIOGRAPHY N/A 02/26/2017   Procedure: LEFT HEART CATH AND CORONARY ANGIOGRAPHY;  Surgeon: Kathleene Hazel, MD;  Location: [facility];  Service: Cardiovascular;  Laterality: N/A;   LEFT HEART CATH AND CORONARY ANGIOGRAPHY N/A 03/29/2021   Procedure: LEFT HEART CATH AND CORONARY ANGIOGRAPHY;  Surgeon: Swazil[place], Peter M, MD;  Location: [facility] INVASIVE CV LAB;  Service: Cardiovascular;  Laterality: N/A;   LEFT HEART CATHETERIZATION WITH CORONARY ANGIOGRAM N/A 10/04/2012   Procedure: LEFT HEART CATHETERIZATION WITH CORONARY ANGIOGRAM;  Surgeon: Tonny Bollman, MD;  Location: [facility] CATH LAB;  Service: Cardiovascular;  Laterality: N/A;   Patient Active Problem List   Diagnosis Date Noted   Tibia fracture 01/26/2023   Long term (current) use of anticoagulants 06/07/2022   TIA (transient ischemic attack) 06/02/2022   Acute CVA (cerebrovascular accident) (HCC) 06/01/2022   Sinus bradycardia 06/01/2022   CKD (chronic kidney disease), stage II 06/01/2022   Cardiac akinesia 06/01/2022   LLQ pain 03/28/2021   BPH (benign prostatic hyperplasia) 03/28/2021   Spleen injury 03/28/2021   LV (left ventricular) mural thrombus 03/28/2021   Chronic combined systolic and diastolic heart failure (HCC) 03/28/2021   Abnormal CT scan, kidney 03/28/2021  NSTEMI (non-ST elevated myocardial infarction) (HCC) 03/27/2021   Chest pain 03/06/2017   Chest pain with moderate risk for cardiac etiology    Unstable angina (HCC)    Ischemic cardiomyopathy 02/20/2017   Exertional angina (HCC) 10/05/2012   CAD (coronary artery disease) 12/19/2010   CIRCADIAN RHYTHM SLEEP DISORDER SHIFT WORK TYPE 03/10/2010   INADEQUATE SLEEP HYGIENE 08/20/2007   OSA (obstructive sleep apnea) 08/20/2007   Allergic rhinitis 08/20/2007   Asthma 08/20/2007   HLD (hyperlipidemia) 08/19/2007   HTN (hypertension) 08/19/2007   Acute myocardial infarction (HCC) 08/19/2007   Coronary  atherosclerosis 08/19/2007    PCP: Clovis Riley, L.[name], MD  REFERRING PROVIDER: Barnetta Chapel, [name]  REFERRING DIAG: Closed nondisplaced fracture of lateral condyle of right tibia, initial encounter  THERAPY DIAG:  Acute pain of right knee  Acute pain of left knee  Muscle weakness (generalized)  Other abnormalities of gait and mobility  Rationale for Evaluation and Treatment: Rehabilitation  ONSET DATE: 01/20/2023   SUBJECTIVE:  SUBJECTIVE STATEMENT: Patient reports he is doing better. He is wearing a regular shoe for the first time today and that is going well.   PERTINENT HISTORY: Right tibial plateau and proximal fibula head fracture, left fibular fracture  PAIN:  Are you having pain? Yes:  NPRS scale: 4/10 Pain location: Bilateral knee Pain description: Stiff, uncomfortable Aggravating factors: Walking, activity  Relieving factors: Ice, medication  PRECAUTIONS: None  WEIGHT BEARING RESTRICTIONS: WBAT per patient  PATIENT GOALS: Pain relief, get back to walking and prior level of function   OBJECTIVE:  Note: Objective measures were completed at Evaluation unless otherwise noted. PATIENT SURVEYS:  FOTO 14% functional status  03/12/2023: 3%  EDEMA:  Patient does exhibit right lower leg edema compared to the left side   MUSCLE LENGTH: Limitation with bilateral hamstrings and calf  PALPATION: Generalized tenderness of right knee and lateral tenderness of left knee  LOWER EXTREMITY ROM:  Active ROM Right eval Left eval Right 02/26/2023 Rt / Lt 03/05/2023  Hip flexion      Hip extension      Hip abduction      Hip adduction      Hip internal rotation      Hip external rotation      Knee flexion 103 125 120 128 / 130  Knee extension Lacking 8 0 0   Ankle dorsiflexion      Ankle plantarflexion      Ankle inversion      Ankle eversion       (Blank rows = not tested)  LOWER EXTREMITY MMT:  MMT Right eval Left eval Rt / Lt 03/19/2023  Hip  flexion 4 4   Hip extension     Hip abduction 2 3   Hip adduction     Hip internal rotation     Hip external rotation     Knee flexion 4- 4 4 / 5  Knee extension 4 4 4  / 5  Ankle dorsiflexion     Ankle plantarflexion     Ankle inversion     Ankle eversion      (Blank rows = not tested)  FUNCTIONAL TESTS:  Not assessed  GAIT: Assistive device utilized: Walker - 2 wheeled Level of assistance: Modified independence Comments: TTWB on right  03/12/2023: WBAT on right using SPC   TODAY'S TREATMENT [facility] Adult PT Treatment:  DATE: 03/19/2023 Therapeutic Exercise: Recumbent bike L4 x 5 min to improve LE endurance Leg press (cybex) 60# 2 x 10. 80# 2 x 10 Knee extension machine 5# 3 x 10 Knee flexion machine 15# 3 x 10 Squat at FM bar x 10 Standing hip abduction with blue at knees 3 x 15 each Forward 8" step-up 2 x 10 each Bridge 3 x 10 Longsitting ankle PF with blue 2 x 20   [facility] Adult PT Treatment:                                                DATE: 03/14/2023 Therapeutic Exercise: Recumbent bike L4 x 5 min to improve LE endurance Leg press (cybex) 40# x 10, 60# 3 x 10 Bridge 3 x 10 SLR 2 x 10 each LAQ 3 x 10 with 5# each Longsitting ankle PF with blue 2 x 20 Standing hip abduction with blue at knees 3 x 15 each  [facility] Adult PT Treatment:                                                DATE: 03/12/2023 Therapeutic Exercise: NuStep L6 x 5 min with LE only to improve LE strength and endurance SLR 3 x 10 each Bridge 3 x 10 Sidelying hip abduction 3 x 15 each Sit to stand from elevated mat table 2 x 10 Trialed knee extension machine but patient unable to perform LAQ 3 x 10 with 5# each Leg press (BATCA) 20# 3 x 10  [facility] Adult PT Treatment:                                                DATE: 03/07/2023 Therapeutic Exercise: Recumbent bike L3 x 5 min to improve LE endurance Longsitting calf stretch with strap 3 x 30 sec on  right Supine hamstring stretch 3 x 20 sec on right SAQ 4# 3 x 10 on right SLR 3 x 10 on right Longsitting ankle PF and DF with blue 3 x 10 on right Bridge with legs on bolster 3 x 10 Sidelying hip abduction 3 x 15 each LAQ 3 x 15 each with 6# on left and 4# on right  [facility] Adult PT Treatment:                                                DATE: 03/05/2023 Therapeutic Exercise: SAQ x 10 with 3# 2 x 10 on right SLR 3 x 10 each Hooklying adductor ball squeeze 2 x 10 Hooklying clamshell with blue 2 x 15 Sidelying hip abduction 2 x 15 each LAQ 3 x 15 each with 5# on left and 3# on right Seated hamstring curl with blue 3 x 10 each Recumbent bike L3 x 5 min to improve LE endurance  PATIENT EDUCATION:  Education details: HEP Person educated: Patient Education method: Explanation, Demonstration, Tactile cues, Verbal cues Education comprehension: verbalized understanding, returned demonstration, verbal cues required, tactile cues required,  and needs further education  HOME EXERCISE PROGRAM: Access Code: [email or web address]   ASSESSMENT: CLINICAL IMPRESSION: Patient tolerated therapy well with no adverse effects. Therapy focused on continuing to progress LE strengthening with good tolerance. He was able to progress with weighted resistance for LE strengthening and to more closed chain exercises. He continues to exhibit strength deficit greater on the right. No changes made to HEP this visit. Patient would benefit from continued skilled PT to progress his mobility and strength in order to reduce pain and maximize functional ability.    OBJECTIVE IMPAIRMENTS: Abnormal gait, decreased activity tolerance, decreased balance, difficulty walking, decreased ROM, decreased strength, impaired flexibility, and pain.   ACTIVITY LIMITATIONS: carrying, lifting, standing, squatting, stairs, transfers, and locomotion level  PARTICIPATION LIMITATIONS: meal prep, cleaning, laundry, driving, shopping, community  activity, and yard work  PERSONAL FACTORS: Fitness, Past/current experiences, and Time since onset of injury/illness/exacerbation are also affecting patient's functional outcome.    GOALS: Goals reviewed with patient? Yes  SHORT TERM GOALS: Target date: 03/12/2023  Patient will be I with initial HEP in order to progress with therapy. Baseline: HEP provided at eval 03/12/2023: independent Goal status: MET  2.  Patient will demonstrate right knee AROM 0-125 deg in order to improve his mobility Baseline: right knee AROM 8-103 deg 03/12/2023: 0-128 deg Goal status: MET  3.  Patient will report bilateral knee pain </= 3/10 in order to reduce functional limitations Baseline: 5-6/10 pain 03/12/2023: 3-5/10 pain Goal status: ONGOING  LONG TERM GOALS: Target date: 04/09/2023  Patient will be I with final HEP to maintain progress from PT. Baseline: HEP provided at eval Goal status: INITIAL  2.  Patient will report >/= 46% status on FOTO to indicate improved functional ability. Baseline: 14% functional status 03/12/2023: 3% Goal status: ONGOING  3.  Patient will demonstrate bilateral knee strength 5/5 MMT in order to improve his walking and activity tolerance.  Baseline: see limitations above Goal status: INITIAL  4.  Patient will be able to ambulate at community level with no limitations or AD in order to return to prior level of function Baseline: patient limited with ambulation at household level with RW Goal status: INITIAL   PLAN: PT FREQUENCY: 1-2x/week  PT DURATION: 8 weeks  PLANNED INTERVENTIONS: 97146- PT Re-evaluation, 97110-Therapeutic exercises, 97530- Therapeutic activity, 97112- Neuromuscular re-education, 97535- Self Care, 60109- Manual therapy, [id number]- Gait training, [phone]- Aquatic Therapy, 97014- Electrical stimulation (unattended), [phone]- Electrical stimulation (manual), Balance training, Stair training, Taping, Dry Needling, Joint mobilization, Joint  manipulation, Cryotherapy, and Moist heat  PLAN FOR NEXT SESSION: Review HEP and progress PRN, gait training using cane, progress knee mobility and quad strengthening in open chain, hip strengthening   Rosana Hoes, PT, DPT, LAT, ATC 03/19/23  1:57 PM Phone: [phone] Fax: [phone]

## 2023-03-21 ENCOUNTER — Ambulatory Visit: Payer: Federal, State, Local not specified - PPO | Admitting: Physical Therapy

## 2023-03-21 ENCOUNTER — Encounter: Payer: Self-pay | Admitting: Physical Therapy

## 2023-03-21 ENCOUNTER — Other Ambulatory Visit: Payer: Self-pay

## 2023-03-21 DIAGNOSIS — M25562 Pain in left knee: Secondary | ICD-10-CM | POA: Diagnosis not present

## 2023-03-21 DIAGNOSIS — R2689 Other abnormalities of gait and mobility: Secondary | ICD-10-CM

## 2023-03-21 DIAGNOSIS — M6281 Muscle weakness (generalized): Secondary | ICD-10-CM | POA: Diagnosis not present

## 2023-03-21 DIAGNOSIS — M25561 Pain in right knee: Secondary | ICD-10-CM

## 2023-03-21 NOTE — Therapy (Signed)
OUTPATIENT PHYSICAL THERAPY TREATMENT   Patient Name: Austin Ingram. MRN: [id number] DOB:January 01, 1961, 62 y.o., male Today's Date: 03/21/2023   END OF SESSION:  PT End of Session - 03/21/23 1323     Visit Number 9    Number of Visits 17    Date for PT Re-Evaluation 04/09/23    Authorization Type BCBS    PT Start Time 1315    PT Stop Time 1355    PT Time Calculation (min) 40 min    Activity Tolerance Patient tolerated treatment well    Behavior During Therapy WFL for tasks assessed/performed                     Past Medical History:  Diagnosis Date   Allergic rhinitis    Anxiety    Arthritis    "lower back, right thumb, knees" (10/04/2012)   Asthma    "grew out of it" (10/04/2012)   CKD (chronic kidney disease), stage II    Coronary artery disease    a. s/p prior stent to LAD;  b. LHC 6/14: DES to pRCA. c. DES to dCx 01/2017 with residual disease.   Depression    Hyperlipidemia    Hypertension    Ischemic cardiomyopathy    LV (left ventricular) mural thrombus    MI (myocardial infarction) (HCC) 03/2007   OSA (obstructive sleep apnea)    mild   Pneumonia    "as a child" (10/04/2012)   Sinus bradycardia    Splenic infarct    Past Surgical History:  Procedure Laterality Date   ARTHROPLASTY Right 1993   'crushed; removed bone fragments" (10/04/2012)   CARDIAC CATHETERIZATION  2010   CORONARY ANGIOPLASTY WITH STENT PLACEMENT  2008; 10/04/2012   "1 + 1" (10/04/2012)   CORONARY PRESSURE/FFR STUDY N/A 02/26/2017   Procedure: INTRAVASCULAR PRESSURE WIRE/FFR STUDY;  Surgeon: Kathleene Hazel, MD;  Location: [facility] INVASIVE CV LAB;  Service: Cardiovascular;  Laterality: N/A;   CORONARY STENT INTERVENTION N/A 02/26/2017   Procedure: CORONARY STENT INTERVENTION;  Surgeon: Kathleene Hazel, MD;  Location: [facility] INVASIVE CV LAB;  Service: Cardiovascular;  Laterality: N/A;   EXPLORATORY LAPAROTOMY  1990's?   "went in to repair hernia; found fatty tissue instead; no  hernia repair" (10/04/2012)   LEFT HEART CATH AND CORONARY ANGIOGRAPHY N/A 02/26/2017   Procedure: LEFT HEART CATH AND CORONARY ANGIOGRAPHY;  Surgeon: Kathleene Hazel, MD;  Location: [facility] INVASIVE CV [facility];  Service: Cardiovascular;  Laterality: N/A;   LEFT HEART CATH AND CORONARY ANGIOGRAPHY N/A 03/29/2021   Procedure: LEFT HEART CATH AND CORONARY ANGIOGRAPHY;  Surgeon: [place], Peter M, MD;  Location: [facility] INVASIVE CV LAB;  Service: Cardiovascular;  Laterality: N/A;   LEFT HEART CATHETERIZATION WITH CORONARY ANGIOGRAM N/A 10/04/2012   Procedure: LEFT HEART CATHETERIZATION WITH CORONARY ANGIOGRAM;  Surgeon: Tonny Bollman, MD;  Location: [facility] CATH LAB;  Service: Cardiovascular;  Laterality: N/A;   Patient Active Problem List   Diagnosis Date Noted   Tibia fracture 01/26/2023   Long term (current) use of anticoagulants 06/07/2022   TIA (transient ischemic attack) 06/02/2022   Acute CVA (cerebrovascular accident) (HCC) 06/01/2022   Sinus bradycardia 06/01/2022   CKD (chronic kidney disease), stage II 06/01/2022   Cardiac akinesia 06/01/2022   LLQ pain 03/28/2021   BPH (benign prostatic hyperplasia) 03/28/2021   Spleen injury 03/28/2021   LV (left ventricular) mural thrombus 03/28/2021   Chronic combined systolic and diastolic heart failure (HCC) 03/28/2021   Abnormal CT scan, kidney 03/28/2021  NSTEMI (non-ST elevated myocardial infarction) (HCC) 03/27/2021   Chest pain 03/06/2017   Chest pain with moderate risk for cardiac etiology    Unstable angina (HCC)    Ischemic cardiomyopathy 02/20/2017   Exertional angina (HCC) 10/05/2012   CAD (coronary artery disease) 12/19/2010   CIRCADIAN RHYTHM SLEEP DISORDER SHIFT WORK TYPE 03/10/2010   INADEQUATE SLEEP HYGIENE 08/20/2007   OSA (obstructive sleep apnea) 08/20/2007   Allergic rhinitis 08/20/2007   Asthma 08/20/2007   HLD (hyperlipidemia) 08/19/2007   HTN (hypertension) 08/19/2007   Acute myocardial infarction (HCC) 08/19/2007   Coronary  atherosclerosis 08/19/2007    PCP: Clovis Riley, L.[name], MD  REFERRING PROVIDER: Barnetta Chapel, [name]  REFERRING DIAG: Closed nondisplaced fracture of lateral condyle of right tibia, initial encounter  THERAPY DIAG:  Acute pain of right knee  Acute pain of left knee  Muscle weakness (generalized)  Other abnormalities of gait and mobility  Rationale for Evaluation and Treatment: Rehabilitation  ONSET DATE: 01/20/2023   SUBJECTIVE:  SUBJECTIVE STATEMENT: Patient reports he is feeling a little stiff today.  PERTINENT HISTORY: Right tibial plateau and proximal fibula head fracture, left fibular fracture  PAIN:  Are you having pain? Yes:  NPRS scale: 4/10 Pain location: Bilateral knee Pain description: Stiff, uncomfortable Aggravating factors: Walking, activity  Relieving factors: Ice, medication  PRECAUTIONS: None  WEIGHT BEARING RESTRICTIONS: WBAT per patient  PATIENT GOALS: Pain relief, get back to walking and prior level of function   OBJECTIVE:  Note: Objective measures were completed at Evaluation unless otherwise noted. PATIENT SURVEYS:  FOTO 14% functional status  03/12/2023: 3%  EDEMA:  Patient does exhibit right lower leg edema compared to the left side   MUSCLE LENGTH: Limitation with bilateral hamstrings and calf  PALPATION: Generalized tenderness of right knee and lateral tenderness of left knee  LOWER EXTREMITY ROM:  Active ROM Right eval Left eval Right 02/26/2023 Rt / Lt 03/05/2023  Hip flexion      Hip extension      Hip abduction      Hip adduction      Hip internal rotation      Hip external rotation      Knee flexion 103 125 120 128 / 130  Knee extension Lacking 8 0 0   Ankle dorsiflexion      Ankle plantarflexion      Ankle inversion      Ankle eversion       (Blank rows = not tested)  LOWER EXTREMITY MMT:  MMT Right eval Left eval Rt / Lt 03/19/2023  Hip flexion 4 4   Hip extension     Hip abduction 2 3   Hip  adduction     Hip internal rotation     Hip external rotation     Knee flexion 4- 4 4 / 5  Knee extension 4 4 4  / 5  Ankle dorsiflexion     Ankle plantarflexion     Ankle inversion     Ankle eversion      (Blank rows = not tested)  FUNCTIONAL TESTS:  Not assessed  GAIT: Assistive device utilized: Walker - 2 wheeled Level of assistance: Modified independence Comments: TTWB on right  03/12/2023: WBAT on right using SPC   TODAY'S TREATMENT [facility] Adult PT Treatment:  DATE: 03/21/2023 Therapeutic Exercise: Recumbent bike L4 x 5 min to improve LE endurance Leg press (cybex) 80# x 15, 100# 3 x 10 Knee extension machine 5# 3 x 10 LAQ 5# 3 x 10 on right Standing hip abduction with black at knees 3 x 15 each Standing heel raises on Airex 3 x 10 Sit to stand 3 x 10   [facility] Adult PT Treatment:                                                DATE: 03/19/2023 Therapeutic Exercise: Recumbent bike L4 x 5 min to improve LE endurance Leg press (cybex) 60# 2 x 10. 80# 2 x 10 Knee extension machine 5# 3 x 10 Knee flexion machine 15# 3 x 10 Squat at FM bar x 10 Standing hip abduction with blue at knees 3 x 15 each Forward 8" step-up 2 x 10 each Bridge 3 x 10 Longsitting ankle PF with blue 2 x 20  [facility] Adult PT Treatment:                                                DATE: 03/14/2023 Therapeutic Exercise: Recumbent bike L4 x 5 min to improve LE endurance Leg press (cybex) 40# x 10, 60# 3 x 10 Bridge 3 x 10 SLR 2 x 10 each LAQ 3 x 10 with 5# each Longsitting ankle PF with blue 2 x 20 Standing hip abduction with blue at knees 3 x 15 each  [facility] Adult PT Treatment:                                                DATE: 03/12/2023 Therapeutic Exercise: NuStep L6 x 5 min with LE only to improve LE strength and endurance SLR 3 x 10 each Bridge 3 x 10 Sidelying hip abduction 3 x 15 each Sit to stand from elevated mat table 2 x 10 Trialed knee  extension machine but patient unable to perform LAQ 3 x 10 with 5# each Leg press (BATCA) 20# 3 x 10  [facility] Adult PT Treatment:                                                DATE: 03/07/2023 Therapeutic Exercise: Recumbent bike L3 x 5 min to improve LE endurance Longsitting calf stretch with strap 3 x 30 sec on right Supine hamstring stretch 3 x 20 sec on right SAQ 4# 3 x 10 on right SLR 3 x 10 on right Longsitting ankle PF and DF with blue 3 x 10 on right Bridge with legs on bolster 3 x 10 Sidelying hip abduction 3 x 15 each LAQ 3 x 15 each with 6# on left and 4# on right  [facility] Adult PT Treatment:  DATE: 03/05/2023 Therapeutic Exercise: SAQ x 10 with 3# 2 x 10 on right SLR 3 x 10 each Hooklying adductor ball squeeze 2 x 10 Hooklying clamshell with blue 2 x 15 Sidelying hip abduction 2 x 15 each LAQ 3 x 15 each with 5# on left and 3# on right Seated hamstring curl with blue 3 x 10 each Recumbent bike L3 x 5 min to improve LE endurance  PATIENT EDUCATION:  Education details: HEP Person educated: Patient Education method: Programmer, multimedia, Demonstration, Actor cues, Verbal cues Education comprehension: verbalized understanding, returned demonstration, verbal cues required, tactile cues required, and needs further education  HOME EXERCISE PROGRAM: Access Code: [email or web address]   ASSESSMENT: CLINICAL IMPRESSION: Patient tolerated therapy well with no adverse effects. Therapy continues to focus on progressing LE strengthening with good tolerance. He does continue to exhibit strength deficit primarily on the right knee but was able to progress resistance with leg strengthening exercises. No changes made to HEP this visit. Patient would benefit from continued skilled PT to progress his mobility and strength in order to reduce pain and maximize functional ability.    OBJECTIVE IMPAIRMENTS: Abnormal gait, decreased activity tolerance, decreased  balance, difficulty walking, decreased ROM, decreased strength, impaired flexibility, and pain.   ACTIVITY LIMITATIONS: carrying, lifting, standing, squatting, stairs, transfers, and locomotion level  PARTICIPATION LIMITATIONS: meal prep, cleaning, laundry, driving, shopping, community activity, and yard work  PERSONAL FACTORS: Fitness, Past/current experiences, and Time since onset of injury/illness/exacerbation are also affecting patient's functional outcome.    GOALS: Goals reviewed with patient? Yes  SHORT TERM GOALS: Target date: 03/12/2023  Patient will be I with initial HEP in order to progress with therapy. Baseline: HEP provided at eval 03/12/2023: independent Goal status: MET  2.  Patient will demonstrate right knee AROM 0-125 deg in order to improve his mobility Baseline: right knee AROM 8-103 deg 03/12/2023: 0-128 deg Goal status: MET  3.  Patient will report bilateral knee pain </= 3/10 in order to reduce functional limitations Baseline: 5-6/10 pain 03/12/2023: 3-5/10 pain Goal status: ONGOING  LONG TERM GOALS: Target date: 04/09/2023  Patient will be I with final HEP to maintain progress from PT. Baseline: HEP provided at eval Goal status: INITIAL  2.  Patient will report >/= 46% status on FOTO to indicate improved functional ability. Baseline: 14% functional status 03/12/2023: 3% Goal status: ONGOING  3.  Patient will demonstrate bilateral knee strength 5/5 MMT in order to improve his walking and activity tolerance.  Baseline: see limitations above Goal status: INITIAL  4.  Patient will be able to ambulate at community level with no limitations or AD in order to return to prior level of function Baseline: patient limited with ambulation at household level with RW Goal status: INITIAL   PLAN: PT FREQUENCY: 1-2x/week  PT DURATION: 8 weeks  PLANNED INTERVENTIONS: 97146- PT Re-evaluation, 97110-Therapeutic exercises, 97530- Therapeutic activity, 97112-  Neuromuscular re-education, 97535- Self Care, 54098- Manual therapy, [id number]- Gait training, [phone]- Aquatic Therapy, 97014- Electrical stimulation (unattended), [phone]- Electrical stimulation (manual), Balance training, Stair training, Taping, Dry Needling, Joint mobilization, Joint manipulation, Cryotherapy, and Moist heat  PLAN FOR NEXT SESSION: Review HEP and progress PRN, gait training using cane, progress knee mobility and quad strengthening in open chain, hip strengthening   Rosana Hoes, PT, DPT, LAT, ATC 03/21/23  2:00 PM Phone: [phone] Fax: [phone]
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
## 2023-03-26 ENCOUNTER — Encounter (HOSPITAL_COMMUNITY): Payer: Self-pay

## 2023-03-26 ENCOUNTER — Ambulatory Visit (HOSPITAL_COMMUNITY)
Admission: RE | Admit: 2023-03-26 | Discharge: 2023-03-26 | Disposition: A | Discharge status: Home / Self Care | Payer: Federal, State, Local not specified - PPO | Source: Ambulatory Visit | Attending: Family Medicine | Admitting: Family Medicine

## 2023-03-26 VITALS — BP 102/68 | HR 64 | Wt 186.8 lb

## 2023-03-26 DIAGNOSIS — I110 Hypertensive heart disease with heart failure: Secondary | ICD-10-CM | POA: Diagnosis not present

## 2023-03-26 DIAGNOSIS — Z7901 Long term (current) use of anticoagulants: Secondary | ICD-10-CM | POA: Insufficient documentation

## 2023-03-26 DIAGNOSIS — I2510 Atherosclerotic heart disease of native coronary artery without angina pectoris: Secondary | ICD-10-CM

## 2023-03-26 DIAGNOSIS — Z8673 Personal history of transient ischemic attack (TIA), and cerebral infarction without residual deficits: Secondary | ICD-10-CM | POA: Diagnosis not present

## 2023-03-26 DIAGNOSIS — I255 Ischemic cardiomyopathy: Secondary | ICD-10-CM | POA: Insufficient documentation

## 2023-03-26 DIAGNOSIS — Z955 Presence of coronary angioplasty implant and graft: Secondary | ICD-10-CM | POA: Diagnosis not present

## 2023-03-26 DIAGNOSIS — Z7984 Long term (current) use of oral hypoglycemic drugs: Secondary | ICD-10-CM | POA: Insufficient documentation

## 2023-03-26 DIAGNOSIS — Z79899 Other long term (current) drug therapy: Secondary | ICD-10-CM | POA: Insufficient documentation

## 2023-03-26 DIAGNOSIS — N529 Male erectile dysfunction, unspecified: Secondary | ICD-10-CM

## 2023-03-26 DIAGNOSIS — Z86718 Personal history of other venous thrombosis and embolism: Secondary | ICD-10-CM | POA: Diagnosis not present

## 2023-03-26 DIAGNOSIS — I513 Intracardiac thrombosis, not elsewhere classified: Secondary | ICD-10-CM | POA: Diagnosis not present

## 2023-03-26 DIAGNOSIS — I252 Old myocardial infarction: Secondary | ICD-10-CM | POA: Insufficient documentation

## 2023-03-26 DIAGNOSIS — I5022 Chronic systolic (congestive) heart failure: Secondary | ICD-10-CM

## 2023-03-26 LAB — BRAIN NATRIURETIC PEPTIDE: B Natriuretic Peptide: 196.1 pg/mL — ABNORMAL HIGH (ref 0.0–100.0)

## 2023-03-26 MED ORDER — BLOOD PRESSURE CUFF MISC: 0 refills | Status: AC

## 2023-03-26 MED ORDER — CARVEDILOL 3.125 MG PO TABS: 3.1250 mg | ORAL_TABLET | Freq: Two times a day (BID) | ORAL | 6 refills | Status: AC

## 2023-03-26 NOTE — Patient Instructions (Addendum)
Thank you for coming in today  If you had labs drawn today, any labs that are abnormal the clinic will call you No news is good news  You were given a prescription cuff sent to your pharmacy please check and monitor blood pressure daily  Medications: Stop Amlodiopine START Coreg 3.125 mg 1 tablet twice daily   Follow up appointments:  Your physician recommends that you schedule a follow-up appointment in:  4 months With Dr. Earlean Shawl will receive a reminder letter in the mail a few months in advance. If you don't receive a letter, please call our office to schedule the follow-up appointment.    Do the following things EVERYDAY: Weigh yourself in the morning before breakfast. Write it down and keep it in a log. Take your medicines as prescribed Eat low salt foods--Limit salt (sodium) to 2000 mg per day.  Stay as active as you can everyday Limit all fluids for the day to less than 2 liters   At the [facility], you and your health needs are our priority. As part of our continuing mission to provide you with exceptional heart care, we have created designated Provider Care Teams. These Care Teams include your primary Cardiologist (physician) and Advanced Practice Providers (APPs- Physician Assistants and Nurse Practitioners) who all work together to provide you with the care you need, when you need it.   You may see any of the following providers on your designated Care Team at your next follow up: Dr Arvilla Meres Dr Marca Ancona Dr. Marcos Eke, NP Robbie Lis, [place] [facility], [place] Brynda Peon, NP Karle Plumber, PharmD   Please be sure to bring in all your medications bottles to every appointment.    Thank you for choosing  [facility]  If you have any questions or concerns before your next appointment please send [place] a message through [place] or call our office at  [phone].    TO LEAVE A MESSAGE FOR THE NURSE SELECT OPTION 2, PLEASE LEAVE A MESSAGE INCLUDING: YOUR NAME DATE OF BIRTH CALL BACK NUMBER REASON FOR CALL**this is important as we prioritize the call backs  YOU WILL RECEIVE A CALL BACK THE SAME DAY AS LONG AS YOU CALL BEFORE 4:00 PM

## 2023-03-27 ENCOUNTER — Ambulatory Visit: Payer: Federal, State, Local not specified - PPO

## 2023-03-27 DIAGNOSIS — M25562 Pain in left knee: Secondary | ICD-10-CM

## 2023-03-27 DIAGNOSIS — M6281 Muscle weakness (generalized): Secondary | ICD-10-CM | POA: Diagnosis not present

## 2023-03-27 DIAGNOSIS — M25561 Pain in right knee: Secondary | ICD-10-CM | POA: Diagnosis not present

## 2023-03-27 DIAGNOSIS — R2689 Other abnormalities of gait and mobility: Secondary | ICD-10-CM | POA: Diagnosis not present

## 2023-03-27 NOTE — Addendum Note (Signed)
Encounter addended by: Demetrius Charity, RN on: 03/27/2023 4:37 PM  Actions taken: Clinical Note Signed

## 2023-03-27 NOTE — Therapy (Signed)
OUTPATIENT PHYSICAL THERAPY TREATMENT   Patient Name: Austin Ingram. MRN: [id number] DOB:[birth year], 62 y.o., male Today's Date: 03/27/2023   END OF SESSION:  PT End of Session - 03/27/23 1403     Visit Number 10    Number of Visits 17    Date for PT Re-Evaluation 04/09/23    Authorization Type [employer]    PT Start Time 1402    PT Stop Time 1440    PT Time Calculation (min) 38 min    Activity Tolerance Patient tolerated treatment well    Behavior During Therapy WFL for tasks assessed/performed                      Past Medical History:  Diagnosis Date   Allergic rhinitis    Anxiety    Arthritis    "lower back, right thumb, knees" (10/04/2012)   Asthma    "grew out of it" (10/04/2012)   CKD (chronic kidney disease), stage II    Coronary artery disease    a. s/p prior stent to LAD;  b. LHC 6/14: DES to pRCA. c. DES to dCx 01/2017 with residual disease.   Depression    Hyperlipidemia    Hypertension    Ischemic cardiomyopathy    LV (left ventricular) mural thrombus    MI (myocardial infarction) (HCC) 03/2007   OSA (obstructive sleep apnea)    mild   Pneumonia    "as a child" (10/04/2012)   Sinus bradycardia    Splenic infarct    Past Surgical History:  Procedure Laterality Date   ARTHROPLASTY Right 1993   'crushed; removed bone fragments" (10/04/2012)   CARDIAC CATHETERIZATION  2010   CORONARY ANGIOPLASTY WITH STENT PLACEMENT  2008; 10/04/2012   "1 + 1" (10/04/2012)   CORONARY PRESSURE/FFR STUDY N/A 02/26/2017   Procedure: INTRAVASCULAR PRESSURE WIRE/FFR STUDY;  Surgeon: Kathleene Hazel, MD;  Location: [facility] INVASIVE CV [facility];  Service: Cardiovascular;  Laterality: N/A;   CORONARY STENT INTERVENTION N/A 02/26/2017   Procedure: CORONARY STENT INTERVENTION;  Surgeon: Kathleene Hazel, MD;  Location: [facility]AS[facility] CV [facility];  Service: Cardiovascular;  Laterality: N/A;   EXPLORATORY LAPAROTOMY  1990's?   "went in to repair hernia; found fatty tissue instead; no  hernia repair" (10/04/2012)   LEFT HEART CATH AND CORONARY ANGIOGRAPHY N/A 02/26/2017   Procedure: LEFT HEART CATH AND CORONARY ANGIOGRAPHY;  Surgeon: Kathleene Hazel, MD;  Location: [facility] CV [facility];  Service: Cardiovascular;  Laterality: N/A;   LEFT HEART CATH AND CORONARY ANGIOGRAPHY N/A 03/29/2021   Procedure: LEFT HEART CATH AND CORONARY ANGIOGRAPHY;  Surgeon: Swaziland, Peter M, MD;  Location: [facility] INVASIVE CV LAB;  Service: Cardiovascular;  Laterality: N/A;   LEFT HEART CATHETERIZATION WITH CORONARY ANGIOGRAM N/A 10/04/2012   Procedure: LEFT HEART CATHETERIZATION WITH CORONARY ANGIOGRAM;  Surgeon: Tonny Bollman, MD;  Location: [facility] CATH LAB;  Service: Cardiovascular;  Laterality: N/A;   Patient Active Problem List   Diagnosis Date Noted   Tibia fracture 01/26/2023   Long term (current) use of anticoagulants 06/07/2022   TIA (transient ischemic attack) 06/02/2022   Acute CVA (cerebrovascular accident) (HCC) 06/01/2022   Sinus bradycardia 06/01/2022   CKD (chronic kidney disease), stage II 06/01/2022   Cardiac akinesia 06/01/2022   LLQ pain 03/28/2021   BPH (benign prostatic hyperplasia) 03/28/2021   Spleen injury 03/28/2021   LV (left ventricular) mural thrombus 03/28/2021   Chronic combined systolic and diastolic heart failure (HCC) 03/28/2021   Abnormal CT scan, kidney  03/28/2021   NSTEMI (non-ST elevated myocardial infarction) (HCC) 03/27/2021   Chest pain 03/06/2017   Chest pain with moderate risk for cardiac etiology    Unstable angina (HCC)    Ischemic cardiomyopathy 02/20/2017   Exertional angina (HCC) 10/05/2012   CAD (coronary artery disease) 12/19/2010   CIRCADIAN RHYTHM SLEEP DISORDER SHIFT WORK TYPE 03/10/2010   INADEQUATE SLEEP HYGIENE 08/20/2007   OSA (obstructive sleep apnea) 08/20/2007   Allergic rhinitis 08/20/2007   Asthma 08/20/2007   HLD (hyperlipidemia) 08/19/2007   HTN (hypertension) 08/19/2007   Acute myocardial infarction (HCC) 08/19/2007   Coronary  atherosclerosis 08/19/2007    PCP: Clovis Riley, L.[name], MD  REFERRING PROVIDER: Barnetta Chapel, [name]  REFERRING DIAG: Closed nondisplaced fracture of lateral condyle of right tibia, initial encounter  THERAPY DIAG:  Acute pain of right knee  Acute pain of left knee  Rationale for Evaluation and Treatment: Rehabilitation  ONSET DATE: 01/20/2023   SUBJECTIVE:  SUBJECTIVE STATEMENT: Patient reports he is feeling a little stiff today.  PERTINENT HISTORY: Right tibial plateau and proximal fibula head fracture, left fibular fracture  PAIN:  Are you having pain? Yes:  NPRS scale: 4/10 Pain location: Bilateral knee Pain description: Stiff, uncomfortable Aggravating factors: Walking, activity  Relieving factors: Ice, medication  PRECAUTIONS: None  WEIGHT BEARING RESTRICTIONS: WBAT per patient  PATIENT GOALS: Pain relief, get back to walking and prior level of function   OBJECTIVE:  Note: Objective measures were completed at Evaluation unless otherwise noted. PATIENT SURVEYS:  FOTO 14% functional status  03/12/2023: 3%  EDEMA:  Patient does exhibit right lower leg edema compared to the left side   MUSCLE LENGTH: Limitation with bilateral hamstrings and calf  PALPATION: Generalized tenderness of right knee and lateral tenderness of left knee  LOWER EXTREMITY ROM:  Active ROM Right eval Left eval Right 02/26/2023 Rt / Lt 03/05/2023  Hip flexion      Hip extension      Hip abduction      Hip adduction      Hip internal rotation      Hip external rotation      Knee flexion 103 125 120 128 / 130  Knee extension Lacking 8 0 0   Ankle dorsiflexion      Ankle plantarflexion      Ankle inversion      Ankle eversion       (Blank rows = not tested)  LOWER EXTREMITY MMT:  MMT Right eval Left eval Rt / Lt 03/19/2023  Hip flexion 4 4   Hip extension     Hip abduction 2 3   Hip adduction     Hip internal rotation     Hip external rotation     Knee flexion 4-  4 4 / 5  Knee extension 4 4 4  / 5  Ankle dorsiflexion     Ankle plantarflexion     Ankle inversion     Ankle eversion      (Blank rows = not tested)  FUNCTIONAL TESTS:  Not assessed  GAIT: Assistive device utilized: Walker - 2 wheeled Level of assistance: Modified independence Comments: TTWB on right  03/12/2023: WBAT on right using SPC   TODAY'S TREATMENT [facility] Adult PT Treatment:  DATE: 03/27/2023 Therapeutic Exercise: Recumbent bike L4 x 3 min to improve LE endurance Leg press (cybex) 100# 3x10 Knee extension machine 10# 2x10 Knee flexion machine 35# 3x10 LAQ 5# 3 x 10 on right Lateral walk RTB x 3 laps at counter Standing hip abd 2x15 RTB Tandem on foam 2x30" Sit to stand 3x10  [facility] Adult PT Treatment:                                                DATE: 03/21/2023 Therapeutic Exercise: Recumbent bike L4 x 5 min to improve LE endurance Leg press (cybex) 80# x 15, 100# 3 x 10 Knee extension machine 5# 3 x 10 LAQ 5# 3 x 10 on right Standing hip abduction with black at knees 3 x 15 each Standing heel raises on Airex 3 x 10 Sit to stand 3 x 10   [facility] Adult PT Treatment:                                                DATE: 03/19/2023 Therapeutic Exercise: Recumbent bike L4 x 5 min to improve LE endurance Leg press (cybex) 60# 2 x 10. 80# 2 x 10 Knee extension machine 5# 3 x 10 Knee flexion machine 15# 3 x 10 Squat at FM bar x 10 Standing hip abduction with blue at knees 3 x 15 each Forward 8" step-up 2 x 10 each Bridge 3 x 10 Longsitting ankle PF with blue 2 x 20  [facility] Adult PT Treatment:                                                DATE: 03/14/2023 Therapeutic Exercise: Recumbent bike L4 x 5 min to improve LE endurance Leg press (cybex) 40# x 10, 60# 3 x 10 Bridge 3 x 10 SLR 2 x 10 each LAQ 3 x 10 with 5# each Longsitting ankle PF with blue 2 x 20 Standing hip abduction with blue at knees 3 x 15 each  [facility]  Adult PT Treatment:                                                DATE: 03/12/2023 Therapeutic Exercise: NuStep L6 x 5 min with LE only to improve LE strength and endurance SLR 3 x 10 each Bridge 3 x 10 Sidelying hip abduction 3 x 15 each Sit to stand from elevated mat table 2 x 10 Trialed knee extension machine but patient unable to perform LAQ 3 x 10 with 5# each Leg press (BATCA) 20# 3 x 10  [facility] Adult PT Treatment:                                                DATE: 03/07/2023 Therapeutic Exercise: Recumbent bike L3 x 5 min to improve LE endurance Longsitting calf stretch  with strap 3 x 30 sec on right Supine hamstring stretch 3 x 20 sec on right SAQ 4# 3 x 10 on right SLR 3 x 10 on right Longsitting ankle PF and DF with blue 3 x 10 on right Bridge with legs on bolster 3 x 10 Sidelying hip abduction 3 x 15 each LAQ 3 x 15 each with 6# on left and 4# on right  [facility] Adult PT Treatment:                                                DATE: 03/05/2023 Therapeutic Exercise: SAQ x 10 with 3# 2 x 10 on right SLR 3 x 10 each Hooklying adductor ball squeeze 2 x 10 Hooklying clamshell with blue 2 x 15 Sidelying hip abduction 2 x 15 each LAQ 3 x 15 each with 5# on left and 3# on right Seated hamstring curl with blue 3 x 10 each Recumbent bike L3 x 5 min to improve LE endurance  PATIENT EDUCATION:  Education details: HEP Person educated: Patient Education method: Programmer, multimedia, Demonstration, Actor cues, Verbal cues Education comprehension: verbalized understanding, returned demonstration, verbal cues required, tactile cues required, and needs further education  HOME EXERCISE PROGRAM: Access Code: [email or web address]   ASSESSMENT: CLINICAL IMPRESSION: Pt was able to complete all prescribed exercises with no adverse effect. Therapy today continued to work on improving strength and functional mobility in R knee and proximal hip. He continues to benefit from skilled PT services, will progress  as tolerated.   OBJECTIVE IMPAIRMENTS: Abnormal gait, decreased activity tolerance, decreased balance, difficulty walking, decreased ROM, decreased strength, impaired flexibility, and pain.   ACTIVITY LIMITATIONS: carrying, lifting, standing, squatting, stairs, transfers, and locomotion level  PARTICIPATION LIMITATIONS: meal prep, cleaning, laundry, driving, shopping, community activity, and yard work  PERSONAL FACTORS: Fitness, Past/current experiences, and Time since onset of injury/illness/exacerbation are also affecting patient's functional outcome.    GOALS: Goals reviewed with patient? Yes  SHORT TERM GOALS: Target date: 03/12/2023  Patient will be I with initial HEP in order to progress with therapy. Baseline: HEP provided at eval 03/12/2023: independent Goal status: MET  2.  Patient will demonstrate right knee AROM 0-125 deg in order to improve his mobility Baseline: right knee AROM 8-103 deg 03/12/2023: 0-128 deg Goal status: MET  3.  Patient will report bilateral knee pain </= 3/10 in order to reduce functional limitations Baseline: 5-6/10 pain 03/12/2023: 3-5/10 pain Goal status: ONGOING  LONG TERM GOALS: Target date: 04/09/2023  Patient will be I with final HEP to maintain progress from PT. Baseline: HEP provided at eval Goal status: INITIAL  2.  Patient will report >/= 46% status on FOTO to indicate improved functional ability. Baseline: 14% functional status 03/12/2023: 3% Goal status: ONGOING  3.  Patient will demonstrate bilateral knee strength 5/5 MMT in order to improve his walking and activity tolerance.  Baseline: see limitations above Goal status: INITIAL  4.  Patient will be able to ambulate at community level with no limitations or AD in order to return to prior level of function Baseline: patient limited with ambulation at household level with RW Goal status: INITIAL   PLAN: PT FREQUENCY: 1-2x/week  PT DURATION: 8 weeks  PLANNED  INTERVENTIONS: 97146- PT Re-evaluation, 97110-Therapeutic exercises, 97530- Therapeutic activity, [id number]- Neuromuscular re-education, 97535- Self Care, [id number]- Manual therapy, [id number]- Gait training, [phone]- Aquatic  Therapy, 97014- Electrical stimulation (unattended), [phone]- Electrical stimulation (manual), Balance training, Stair training, Taping, Dry Needling, Joint mobilization, Joint manipulation, Cryotherapy, and Moist heat  PLAN FOR NEXT SESSION: Review HEP and progress PRN, gait training using cane, progress knee mobility and quad strengthening in open chain, hip strengthening   Eloy End PT  03/27/23 2:40 PM

## 2023-03-27 NOTE — Addendum Note (Signed)
Encounter addended by: Jacklynn Ganong, FNP on: 03/27/2023 8:35 AM  Actions taken: Clinical Note Signed

## 2023-03-27 NOTE — Progress Notes (Signed)
Called pt and pt wife to schedule a lab appt for BMET no answer on either phone line.

## 2023-04-02 ENCOUNTER — Ambulatory Visit: Payer: Federal, State, Local not specified - PPO

## 2023-04-02 DIAGNOSIS — M7552 Bursitis of left shoulder: Secondary | ICD-10-CM | POA: Diagnosis not present

## 2023-04-02 NOTE — Therapy (Incomplete)
OUTPATIENT PHYSICAL THERAPY TREATMENT   Patient Name: Austin Ingram. MRN: [id number] DOB:1961-04-29, 62 y.o., male Today's Date: 04/02/2023   END OF SESSION:             Past Medical History:  Diagnosis Date   Allergic rhinitis    Anxiety    Arthritis    "lower back, right thumb, knees" (10/04/2012)   Asthma    "grew out of it" (10/04/2012)   CKD (chronic kidney disease), stage II    Coronary artery disease    a. s/p prior stent to LAD;  b. LHC 6/14: DES to pRCA. c. DES to dCx 01/2017 with residual disease.   Depression    Hyperlipidemia    Hypertension    Ischemic cardiomyopathy    LV (left ventricular) mural thrombus    MI (myocardial infarction) (HCC) 03/2007   OSA (obstructive sleep apnea)    mild   Pneumonia    "as a child" (10/04/2012)   Sinus bradycardia    Splenic infarct    Past Surgical History:  Procedure Laterality Date   ARTHROPLASTY Right 1993   'crushed; removed bone fragments" (10/04/2012)   CARDIAC CATHETERIZATION  2010   CORONARY ANGIOPLASTY WITH STENT PLACEMENT  2008; 10/04/2012   "1 + 1" (10/04/2012)   CORONARY PRESSURE/FFR STUDY N/A 02/26/2017   Procedure: INTRAVASCULAR PRESSURE WIRE/FFR STUDY;  Surgeon: Kathleene Hazel, MD;  Location: [facility] IN[facility];  Service: Cardiovascular;  Laterality: N/A;   CORONARY STENT INTERVENTION N/A 02/26/2017   Procedure: CORONARY STENT INTERVENTION;  Surgeon: Kathleene Hazel, MD;  Location: [facility];  Service: Cardiovascular;  Laterality: N/A;   EXPLORATORY LAPAROTOMY  1990's?   "went in to repair hernia; found fatty tissue instead; no hernia repair" (10/04/2012)   LEFT HEART CATH AND CORONARY ANGIOGRAPHY N/A 02/26/2017   Procedure: LEFT HEART CATH AND CORONARY ANGIOGRAPHY;  Surgeon: Kathleene Hazel, MD;  Location: [facility];  Service: Cardiovascular;  Laterality: N/A;   LEFT HEART CATH AND CORONARY ANGIOGRAPHY N/A 03/29/2021   Procedure: LEFT HEART CATH AND CORONARY  ANGIOGRAPHY;  Surgeon: Swaziland, Peter M, MD;  Location: [facility] INVASIVE CV LAB;  Service: Cardiovascular;  Laterality: N/A;   LEFT HEART CATHETERIZATION WITH CORONARY ANGIOGRAM N/A 10/04/2012   Procedure: LEFT HEART CATHETERIZATION WITH CORONARY ANGIOGRAM;  Surgeon: Tonny Bollman, MD;  Location: [facility] CATH LAB;  Service: Cardiovascular;  Laterality: N/A;   Patient Active Problem List   Diagnosis Date Noted   Tibia fracture 01/26/2023   Long term (current) use of anticoagulants 06/07/2022   TIA (transient ischemic attack) 06/02/2022   Acute CVA (cerebrovascular accident) (HCC) 06/01/2022   Sinus bradycardia 06/01/2022   CKD (chronic kidney disease), stage II 06/01/2022   Cardiac akinesia 06/01/2022   LLQ pain 03/28/2021   BPH (benign prostatic hyperplasia) 03/28/2021   Spleen injury 03/28/2021   LV (left ventricular) mural thrombus 03/28/2021   Chronic combined systolic and diastolic heart failure (HCC) 03/28/2021   Abnormal CT scan, kidney 03/28/2021   NSTEMI (non-ST elevated myocardial infarction) (HCC) 03/27/2021   Chest pain 03/06/2017   Chest pain with moderate risk for cardiac etiology    Unstable angina (HCC)    Ischemic cardiomyopathy 02/20/2017   Exertional angina (HCC) 10/05/2012   CAD (coronary artery disease) 12/19/2010   CIRCADIAN RHYTHM SLEEP DISORDER SHIFT WORK TYPE 03/10/2010   INADEQUATE SLEEP HYGIENE 08/20/2007   OSA (obstructive sleep apnea) 08/20/2007   Allergic rhinitis 08/20/2007   Asthma 08/20/2007   HLD (hyperlipidemia) 08/19/2007  HTN (hypertension) 08/19/2007   Acute myocardial infarction [facility]) 08/19/2007   Coronary atherosclerosis 08/19/2007    PCP: Clovis Riley, L.[name], MD  REFERRING PROVIDER: Barnetta Chapel, [name]  REFERRING DIAG: Closed nondisplaced fracture of lateral condyle of right tibia, initial encounter  THERAPY DIAG:  No diagnosis found.  Rationale for Evaluation and Treatment: Rehabilitation  ONSET DATE: 01/20/2023   SUBJECTIVE:  SUBJECTIVE  STATEMENT: Patient reports he is feeling a little stiff today.  PERTINENT HISTORY: Right tibial plateau and proximal fibula head fracture, left fibular fracture  PAIN:  Are you having pain? Yes:  NPRS scale: 4/10 Pain location: Bilateral knee Pain description: Stiff, uncomfortable Aggravating factors: Walking, activity  Relieving factors: Ice, medication  PRECAUTIONS: None  WEIGHT BEARING RESTRICTIONS: WBAT per patient  PATIENT GOALS: Pain relief, get back to walking and prior level of function   OBJECTIVE:  Note: Objective measures were completed at Evaluation unless otherwise noted. PATIENT SURVEYS:  FOTO 14% functional status  03/12/2023: 3%  EDEMA:  Patient does exhibit right lower leg edema compared to the left side   MUSCLE LENGTH: Limitation with bilateral hamstrings and calf  PALPATION: Generalized tenderness of right knee and lateral tenderness of left knee  LOWER EXTREMITY ROM:  Active ROM Right eval Left eval Right 02/26/2023 Rt / Lt 03/05/2023  Hip flexion      Hip extension      Hip abduction      Hip adduction      Hip internal rotation      Hip external rotation      Knee flexion 103 125 120 128 / 130  Knee extension Lacking 8 0 0   Ankle dorsiflexion      Ankle plantarflexion      Ankle inversion      Ankle eversion       (Blank rows = not tested)  LOWER EXTREMITY MMT:  MMT Right eval Left eval Rt / Lt 03/19/2023  Hip flexion 4 4   Hip extension     Hip abduction 2 3   Hip adduction     Hip internal rotation     Hip external rotation     Knee flexion 4- 4 4 / 5  Knee extension 4 4 4  / 5  Ankle dorsiflexion     Ankle plantarflexion     Ankle inversion     Ankle eversion      (Blank rows = not tested)  FUNCTIONAL TESTS:  Not assessed  GAIT: Assistive device utilized: Environmental consultant - 2 wheeled Level of assistance: Modified independence Comments: TTWB on right  03/12/2023: WBAT on right using SPC   TODAY'S TREATMENT [facility]  Adult PT Treatment:                                                DATE: 03/27/2023 Therapeutic Exercise: Recumbent bike L4 x 3 min to improve LE endurance Leg press (cybex) 100# 3x10 Knee extension machine 10# 2x10 Knee flexion machine 35# 3x10 LAQ 5# 3 x 10 on right Lateral walk RTB x 3 laps at counter Standing hip abd 2x15 RTB Tandem on foam 2x30" Sit to stand 3x10  [facility] Adult PT Treatment:  DATE: 03/21/2023 Therapeutic Exercise: Recumbent bike L4 x 5 min to improve LE endurance Leg press (cybex) 80# x 15, 100# 3 x 10 Knee extension machine 5# 3 x 10 LAQ 5# 3 x 10 on right Standing hip abduction with black at knees 3 x 15 each Standing heel raises on Airex 3 x 10 Sit to stand 3 x 10   [facility] Adult PT Treatment:                                                DATE: 03/19/2023 Therapeutic Exercise: Recumbent bike L4 x 5 min to improve LE endurance Leg press (cybex) 60# 2 x 10. 80# 2 x 10 Knee extension machine 5# 3 x 10 Knee flexion machine 15# 3 x 10 Squat at FM bar x 10 Standing hip abduction with blue at knees 3 x 15 each Forward 8" step-up 2 x 10 each Bridge 3 x 10 Longsitting ankle PF with blue 2 x 20  [facility] Adult PT Treatment:                                                DATE: 03/14/2023 Therapeutic Exercise: Recumbent bike L4 x 5 min to improve LE endurance Leg press (cybex) 40# x 10, 60# 3 x 10 Bridge 3 x 10 SLR 2 x 10 each LAQ 3 x 10 with 5# each Longsitting ankle PF with blue 2 x 20 Standing hip abduction with blue at knees 3 x 15 each  [facility] Adult PT Treatment:                                                DATE: 03/12/2023 Therapeutic Exercise: NuStep L6 x 5 min with LE only to improve LE strength and endurance SLR 3 x 10 each Bridge 3 x 10 Sidelying hip abduction 3 x 15 each Sit to stand from elevated mat table 2 x 10 Trialed knee extension machine but patient unable to perform LAQ 3 x 10 with 5# each Leg  press (BATCA) 20# 3 x 10  [facility] Adult PT Treatment:                                                DATE: 03/07/2023 Therapeutic Exercise: Recumbent bike L3 x 5 min to improve LE endurance Longsitting calf stretch with strap 3 x 30 sec on right Supine hamstring stretch 3 x 20 sec on right SAQ 4# 3 x 10 on right SLR 3 x 10 on right Longsitting ankle PF and DF with blue 3 x 10 on right Bridge with legs on bolster 3 x 10 Sidelying hip abduction 3 x 15 each LAQ 3 x 15 each with 6# on left and 4# on right  [facility] Adult PT Treatment:  DATE: 03/05/2023 Therapeutic Exercise: SAQ x 10 with 3# 2 x 10 on right SLR 3 x 10 each Hooklying adductor ball squeeze 2 x 10 Hooklying clamshell with blue 2 x 15 Sidelying hip abduction 2 x 15 each LAQ 3 x 15 each with 5# on left and 3# on right Seated hamstring curl with blue 3 x 10 each Recumbent bike L3 x 5 min to improve LE endurance  PATIENT EDUCATION:  Education details: HEP Person educated: Patient Education method: Programmer, multimedia, Demonstration, Actor cues, Verbal cues Education comprehension: verbalized understanding, returned demonstration, verbal cues required, tactile cues required, and needs further education  HOME EXERCISE PROGRAM: Access Code: [email or web address]   ASSESSMENT: CLINICAL IMPRESSION: Pt was able to complete all prescribed exercises with no adverse effect. Therapy today continued to work on improving strength and functional mobility in R knee and proximal hip. He continues to benefit from skilled PT services, will progress as tolerated.   OBJECTIVE IMPAIRMENTS: Abnormal gait, decreased activity tolerance, decreased balance, difficulty walking, decreased ROM, decreased strength, impaired flexibility, and pain.   ACTIVITY LIMITATIONS: carrying, lifting, standing, squatting, stairs, transfers, and locomotion level  PARTICIPATION LIMITATIONS: meal prep, cleaning, laundry, driving, shopping,  community activity, and yard work  PERSONAL FACTORS: Fitness, Past/current experiences, and Time since onset of injury/illness/exacerbation are also affecting patient's functional outcome.    GOALS: Goals reviewed with patient? Yes  SHORT TERM GOALS: Target date: 03/12/2023  Patient will be I with initial HEP in order to progress with therapy. Baseline: HEP provided at eval 03/12/2023: independent Goal status: MET  2.  Patient will demonstrate right knee AROM 0-125 deg in order to improve his mobility Baseline: right knee AROM 8-103 deg 03/12/2023: 0-128 deg Goal status: MET  3.  Patient will report bilateral knee pain </= 3/10 in order to reduce functional limitations Baseline: 5-6/10 pain 03/12/2023: 3-5/10 pain Goal status: ONGOING  LONG TERM GOALS: Target date: 04/09/2023  Patient will be I with final HEP to maintain progress from PT. Baseline: HEP provided at eval Goal status: INITIAL  2.  Patient will report >/= 46% status on FOTO to indicate improved functional ability. Baseline: 14% functional status 03/12/2023: 3% Goal status: ONGOING  3.  Patient will demonstrate bilateral knee strength 5/5 MMT in order to improve his walking and activity tolerance.  Baseline: see limitations above Goal status: INITIAL  4.  Patient will be able to ambulate at community level with no limitations or AD in order to return to prior level of function Baseline: patient limited with ambulation at household level with RW Goal status: INITIAL   PLAN: PT FREQUENCY: 1-2x/week  PT DURATION: 8 weeks  PLANNED INTERVENTIONS: 97146- PT Re-evaluation, 97110-Therapeutic exercises, 97530- Therapeutic activity, 97112- Neuromuscular re-education, 97535- Self Care, 54098- Manual therapy, [id number]- Gait training, [phone]- Aquatic Therapy, 97014- Electrical stimulation (unattended), [phone]- Electrical stimulation (manual), Balance training, Stair training, Taping, Dry Needling, Joint mobilization, Joint  manipulation, Cryotherapy, and Moist heat  PLAN FOR NEXT SESSION: Review HEP and progress PRN, gait training using cane, progress knee mobility and quad strengthening in open chain, hip strengthening   Eloy End PT  04/02/23 10:22 AM

## 2023-04-03 ENCOUNTER — Other Ambulatory Visit (HOSPITAL_COMMUNITY): Payer: Federal, State, Local not specified - PPO

## 2023-04-04 ENCOUNTER — Ambulatory Visit: Payer: Federal, State, Local not specified - PPO | Attending: General Surgery | Admitting: Physical Therapy

## 2023-04-04 ENCOUNTER — Encounter: Payer: Self-pay | Admitting: Physical Therapy

## 2023-04-04 DIAGNOSIS — M6281 Muscle weakness (generalized): Secondary | ICD-10-CM | POA: Diagnosis not present

## 2023-04-04 DIAGNOSIS — R2689 Other abnormalities of gait and mobility: Secondary | ICD-10-CM | POA: Insufficient documentation

## 2023-04-04 DIAGNOSIS — M25562 Pain in left knee: Secondary | ICD-10-CM | POA: Insufficient documentation

## 2023-04-04 DIAGNOSIS — M25561 Pain in right knee: Secondary | ICD-10-CM | POA: Diagnosis not present

## 2023-04-04 NOTE — Therapy (Signed)
OUTPATIENT PHYSICAL THERAPY TREATMENT   Patient Name: Austin Ingram. MRN: [id number] DOB:Oct 24, 1960, 62 y.o., male Today's Date: 04/04/2023   END OF SESSION:  PT End of Session - 04/04/23 1407     Visit Number 11    Number of Visits 17    Date for PT Re-Evaluation 04/09/23    Authorization Type [employer]    PT Start Time 1402    PT Stop Time 1445    PT Time Calculation (min) 43 min                      Past Medical History:  Diagnosis Date   Allergic rhinitis    Anxiety    Arthritis    "lower back, right thumb, knees" (10/04/2012)   Asthma    "grew out of it" (10/04/2012)   CKD (chronic kidney disease), stage II    Coronary artery disease    a. s/p prior stent to LAD;  b. LHC 6/14: DES to pRCA. c. DES to dCx 01/2017 with residual disease.   Depression    Hyperlipidemia    Hypertension    Ischemic cardiomyopathy    LV (left ventricular) mural thrombus    MI (myocardial infarction) (HCC) 03/2007   OSA (obstructive sleep apnea)    mild   Pneumonia    "as a child" (10/04/2012)   Sinus bradycardia    Splenic infarct    Past Surgical History:  Procedure Laterality Date   ARTHROPLASTY Right 1993   'crushed; removed bone fragments" (10/04/2012)   CARDIAC CATHETERIZATION  2010   CORONARY ANGIOPLASTY WITH STENT PLACEMENT  2008; 10/04/2012   "1 + 1" (10/04/2012)   CORONARY PRESSURE/FFR STUDY N/A 02/26/2017   Procedure: INTRAVASCULAR PRESSURE WIRE/FFR STUDY;  Surgeon: Kathleene Hazel, MD;  Location: [facility] INVASIVE CV LAB;  Service: Cardiovascular;  Laterality: N/A;   CORONARY STENT INTERVENTION N/A 02/26/2017   Procedure: CORONARY STENT INTERVENTION;  Surgeon: Kathleene Hazel, MD;  Location: [facility] INVAS[facility] CV [facility];  Service: Cardiovascular;  Laterality: N/A;   EXPLORATORY LAPAROTOMY  1990's?   "went in to repair hernia; found fatty tissue instead; no hernia repair" (10/04/2012)   LEFT HEART CATH AND CORONARY ANGIOGRAPHY N/A 02/26/2017   Procedure: LEFT HEART CATH  AND CORONARY ANGIOGRAPHY;  Surgeon: Kathleene Hazel, MD;  Location: [facility];  Service: Cardiovascular;  Laterality: N/A;   LEFT HEART CATH AND CORONARY ANGIOGRAPHY N/A 03/29/2021   Procedure: LEFT HEART CATH AND CORONARY ANGIOGRAPHY;  Surgeon: Swaziland, Peter M, MD;  Location: [facility] INVASIVE CV LAB;  Service: Cardiovascular;  Laterality: N/A;   LEFT HEART CATHETERIZATION WITH CORONARY ANGIOGRAM N/A 10/04/2012   Procedure: LEFT HEART CATHETERIZATION WITH CORONARY ANGIOGRAM;  Surgeon: Tonny Bollman, MD;  Location: [facility] CATH LAB;  Service: Cardiovascular;  Laterality: N/A;   Patient Active Problem List   Diagnosis Date Noted   Tibia fracture 01/26/2023   Long term (current) use of anticoagulants 06/07/2022   TIA (transient ischemic attack) 06/02/2022   Acute CVA (cerebrovascular accident) (HCC) 06/01/2022   Sinus bradycardia 06/01/2022   CKD (chronic kidney disease), stage II 06/01/2022   Cardiac akinesia 06/01/2022   LLQ pain 03/28/2021   BPH (benign prostatic hyperplasia) 03/28/2021   Spleen injury 03/28/2021   LV (left ventricular) mural thrombus 03/28/2021   Chronic combined systolic and diastolic heart failure (HCC) 03/28/2021   Abnormal CT scan, kidney 03/28/2021   NSTEMI (non-ST elevated myocardial infarction) (HCC) 03/27/2021   Chest pain 03/06/2017   Chest pain  with moderate risk for cardiac etiology    Unstable angina (HCC)    Ischemic cardiomyopathy 02/20/2017   Exertional angina (HCC) 10/05/2012   CAD (coronary artery disease) 12/19/2010   CIRCADIAN RHYTHM SLEEP DISORDER SHIFT WORK TYPE 03/10/2010   INADEQUATE SLEEP HYGIENE 08/20/2007   OSA (obstructive sleep apnea) 08/20/2007   Allergic rhinitis 08/20/2007   Asthma 08/20/2007   HLD (hyperlipidemia) 08/19/2007   HTN (hypertension) 08/19/2007   Acute myocardial infarction (HCC) 08/19/2007   Coronary atherosclerosis 08/19/2007    PCP: Clovis Riley, L.[name], MD  REFERRING PROVIDER: Barnetta Chapel,  [name]  REFERRING DIAG: Closed nondisplaced fracture of lateral condyle of right tibia, initial encounter  THERAPY DIAG:  Acute pain of right knee  Acute pain of left knee  Muscle weakness (generalized)  Other abnormalities of gait and mobility  Rationale for Evaluation and Treatment: Rehabilitation  ONSET DATE: 01/20/2023   SUBJECTIVE:  SUBJECTIVE STATEMENT: Patient reports he is feeling a little stiff today.  PERTINENT HISTORY: Right tibial plateau and proximal fibula head fracture, left fibular fracture  PAIN:  Are you having pain? Yes:  NPRS scale: 4/10 Pain location: Bilateral knee Pain description: Stiff, uncomfortable Aggravating factors: Walking, activity  Relieving factors: Ice, medication  PRECAUTIONS: None  WEIGHT BEARING RESTRICTIONS: WBAT per patient  PATIENT GOALS: Pain relief, get back to walking and prior level of function   OBJECTIVE:  Note: Objective measures were completed at Evaluation unless otherwise noted. PATIENT SURVEYS:  FOTO 14% functional status  03/12/2023: 3%  EDEMA:  Patient does exhibit right lower leg edema compared to the left side   MUSCLE LENGTH: Limitation with bilateral hamstrings and calf  PALPATION: Generalized tenderness of right knee and lateral tenderness of left knee  LOWER EXTREMITY ROM:  Active ROM Right eval Left eval Right 02/26/2023 Rt / Lt 03/05/2023  Hip flexion      Hip extension      Hip abduction      Hip adduction      Hip internal rotation      Hip external rotation      Knee flexion 103 125 120 128 / 130  Knee extension Lacking 8 0 0   Ankle dorsiflexion      Ankle plantarflexion      Ankle inversion      Ankle eversion       (Blank rows = not tested)  LOWER EXTREMITY MMT:  MMT Right eval Left eval Rt / Lt 03/19/2023  Hip flexion 4 4   Hip extension     Hip abduction 2 3   Hip adduction     Hip internal rotation     Hip external rotation     Knee flexion 4- 4 4 / 5  Knee  extension 4 4 4  / 5  Ankle dorsiflexion     Ankle plantarflexion     Ankle inversion     Ankle eversion      (Blank rows = not tested)  FUNCTIONAL TESTS:  Not assessed  GAIT: Assistive device utilized: Walker - 2 wheeled Level of assistance: Modified independence Comments: TTWB on right  03/12/2023: WBAT on right using SPC   TODAY'S TREATMENT [facility] Adult PT Treatment:                                                DATE: 04/04/23 Therapeutic Exercise: Recumbent Bike L5  x 5 minutes  Knee ext 15# 10 x 2  Knee flex 45# 10 x 2  Lateral walk BTB x 4 laps at counter STS 10# 10 x 2  Heel raises 10 x 2  Tandem stance on foam- head turns SLS >25 sec each on level surface  Leg press (cybex) 120# 2x10 8 inch runners  step up 10 x 2 each , without UE Bridge 10# 10 x 2  Side hip abduction to fatigue x 25    [facility] Adult PT Treatment:                                                DATE: 03/27/2023 Therapeutic Exercise: Recumbent bike L4 x 3 min to improve LE endurance Leg press (cybex) 100# 3x10 Knee extension machine 10# 2x10 Knee flexion machine 35# 3x10 LAQ 5# 3 x 10 on right Lateral walk RTB x 3 laps at counter Standing hip abd 2x15 RTB Tandem on foam 2x30" Sit to stand 3x10  [facility] Adult PT Treatment:                                                DATE: 03/21/2023 Therapeutic Exercise: Recumbent bike L4 x 5 min to improve LE endurance Leg press (cybex) 80# x 15, 100# 3 x 10 Knee extension machine 5# 3 x 10 LAQ 5# 3 x 10 on right Standing hip abduction with black at knees 3 x 15 each Standing heel raises on Airex 3 x 10 Sit to stand 3 x 10   [facility] Adult PT Treatment:                                                DATE: 03/19/2023 Therapeutic Exercise: Recumbent bike L4 x 5 min to improve LE endurance Leg press (cybex) 60# 2 x 10. 80# 2 x 10 Knee extension machine 5# 3 x 10 Knee flexion machine 15# 3 x 10 Squat at FM bar x 10 Standing hip abduction with blue at knees 3 x  15 each Forward 8" step-up 2 x 10 each Bridge 3 x 10 Longsitting ankle PF with blue 2 x 20  [facility] Adult PT Treatment:                                                DATE: 03/14/2023 Therapeutic Exercise: Recumbent bike L4 x 5 min to improve LE endurance Leg press (cybex) 40# x 10, 60# 3 x 10 Bridge 3 x 10 SLR 2 x 10 each LAQ 3 x 10 with 5# each Longsitting ankle PF with blue 2 x 20 Standing hip abduction with blue at knees 3 x 15 each        PATIENT EDUCATION:  Education details: HEP Person educated: Patient Education method: Programmer, multimedia, Demonstration, Actor cues, Verbal cues Education comprehension: verbalized understanding, returned demonstration, verbal cues required, tactile cues required, and needs further education  HOME EXERCISE PROGRAM: Access Code: [email or web address]   ASSESSMENT: CLINICAL  IMPRESSION:    Pt was able to complete all prescribed exercises with no adverse effect. Therapy today continued to work on improving strength and functional mobility in R knee and proximal hip. He reports using SPC on long distances otherwise he can go without AD. His pain level averages at 2/10. STG#3 met. He continues to benefit from skilled PT services, will progress as tolerated.   OBJECTIVE IMPAIRMENTS: Abnormal gait, decreased activity tolerance, decreased balance, difficulty walking, decreased ROM, decreased strength, impaired flexibility, and pain.   ACTIVITY LIMITATIONS: carrying, lifting, standing, squatting, stairs, transfers, and locomotion level  PARTICIPATION LIMITATIONS: meal prep, cleaning, laundry, driving, shopping, community activity, and yard work  PERSONAL FACTORS: Fitness, Past/current experiences, and Time since onset of injury/illness/exacerbation are also affecting patient's functional outcome.    GOALS: Goals reviewed with patient? Yes  SHORT TERM GOALS: Target date: 03/12/2023  Patient will be I with initial HEP in order to progress with  therapy. Baseline: HEP provided at eval 03/12/2023: independent Goal status: MET  2.  Patient will demonstrate right knee AROM 0-125 deg in order to improve his mobility Baseline: right knee AROM 8-103 deg 03/12/2023: 0-128 deg Goal status: MET  3.  Patient will report bilateral knee pain </= 3/10 in order to reduce functional limitations Baseline: 5-6/10 pain 03/12/2023: 3-5/10 pain 04/04/23: constant 2/10 Goal status: MET  LONG TERM GOALS: Target date: 04/09/2023  Patient will be I with final HEP to maintain progress from PT. Baseline: HEP provided at eval Goal status: INITIAL  2.  Patient will report >/= 46% status on FOTO to indicate improved functional ability. Baseline: 14% functional status 03/12/2023: 3% Goal status: ONGOING  3.  Patient will demonstrate bilateral knee strength 5/5 MMT in order to improve his walking and activity tolerance.  Baseline: see limitations above Goal status: INITIAL  4.  Patient will be able to ambulate at community level with no limitations or AD in order to return to prior level of function Baseline: patient limited with ambulation at household level with RW 04/04/23:  Goal status: INITIAL   PLAN: PT FREQUENCY: 1-2x/week  PT DURATION: 8 weeks  PLANNED INTERVENTIONS: 97146- PT Re-evaluation, 97110-Therapeutic exercises, 97530- Therapeutic activity, 97112- Neuromuscular re-education, 97535- Self Care, 16109- Manual therapy, [id number]- Gait training, [phone]- Aquatic Therapy, 97014- Electrical stimulation (unattended), [phone]- Electrical stimulation (manual), Balance training, Stair training, Taping, Dry Needling, Joint mobilization, Joint manipulation, Cryotherapy, and Moist heat  PLAN FOR NEXT SESSION: Review HEP and progress PRN, gait training using cane, progress knee mobility and quad strengthening in open chain, hip strengthening   Royden Purl PTA  04/04/23 2:47 PM

## 2023-04-06 DIAGNOSIS — F331 Major depressive disorder, recurrent, moderate: Secondary | ICD-10-CM | POA: Diagnosis not present

## 2023-04-09 ENCOUNTER — Ambulatory Visit: Payer: Federal, State, Local not specified - PPO

## 2023-04-09 DIAGNOSIS — M25561 Pain in right knee: Secondary | ICD-10-CM | POA: Diagnosis not present

## 2023-04-09 DIAGNOSIS — M25562 Pain in left knee: Secondary | ICD-10-CM | POA: Diagnosis not present

## 2023-04-09 DIAGNOSIS — M6281 Muscle weakness (generalized): Secondary | ICD-10-CM

## 2023-04-09 DIAGNOSIS — R2689 Other abnormalities of gait and mobility: Secondary | ICD-10-CM | POA: Diagnosis not present

## 2023-04-09 NOTE — Therapy (Signed)
OUTPATIENT PHYSICAL THERAPY TREATMENT   Patient Name: Austin Ingram. MRN: [id number] DOB:April 27, 1961, 62 y.o., male Today's Date: 04/09/2023   END OF SESSION:  PT End of Session - 04/09/23 1400     Visit Number 12    Number of Visits 17    Date for PT Re-Evaluation 04/09/23    Authorization Type BCBS    PT Start Time 1400    PT Stop Time 1440    PT Time Calculation (min) 40 min                       Past Medical History:  Diagnosis Date   Allergic rhinitis    Anxiety    Arthritis    "lower back, right thumb, knees" (10/04/2012)   Asthma    "grew out of it" (10/04/2012)   CKD (chronic kidney disease), stage II    Coronary artery disease    a. s/p prior stent to LAD;  b. LHC 6/14: DES to pRCA. c. DES to dCx 01/2017 with residual disease.   Depression    Hyperlipidemia    Hypertension    Ischemic cardiomyopathy    LV (left ventricular) mural thrombus    MI (myocardial infarction) (HCC) 03/2007   OSA (obstructive sleep apnea)    mild   Pneumonia    "as a child" (10/04/2012)   Sinus bradycardia    Splenic infarct    Past Surgical History:  Procedure Laterality Date   ARTHROPLASTY Right 1993   'crushed; removed bone fragments" (10/04/2012)   CARDIAC CATHETERIZATION  2010   CORONARY ANGIOPLASTY WITH STENT PLACEMENT  2008; 10/04/2012   "1 + 1" (10/04/2012)   CORONARY PRESSURE/FFR STUDY N/A 02/26/2017   Procedure: INTRAVASCULAR PRESSURE WIRE/FFR STUDY;  Surgeon: Kathleene Hazel, MD;  Location: [facility];  Service: Cardiovascular;  Laterality: N/A;   CORONARY STENT INTERVENTION N/A 02/26/2017   Procedure: CORONARY STENT INTERVENTION;  Surgeon: Kathleene Hazel, MD;  Location: [facility];  Service: Cardiovascular;  Laterality: N/A;   EXPLORATORY LAPAROTOMY  1990's?   "went in to repair hernia; found fatty tissue instead; no hernia repair" (10/04/2012)   LEFT HEART CATH AND CORONARY ANGIOGRAPHY N/A 02/26/2017   Procedure: LEFT HEART CATH  AND CORONARY ANGIOGRAPHY;  Surgeon: Kathleene Hazel, MD;  Location: [facility];  Service: Cardiovascular;  Laterality: N/A;   LEFT HEART CATH AND CORONARY ANGIOGRAPHY N/A 03/29/2021   Procedure: LEFT HEART CATH AND CORONARY ANGIOGRAPHY;  Surgeon: Swaziland, Peter M, MD;  Location: [facility] INVASIVE CV LAB;  Service: Cardiovascular;  Laterality: N/A;   LEFT HEART CATHETERIZATION WITH CORONARY ANGIOGRAM N/A 10/04/2012   Procedure: LEFT HEART CATHETERIZATION WITH CORONARY ANGIOGRAM;  Surgeon: Tonny Bollman, MD;  Location: [facility] CATH LAB;  Service: Cardiovascular;  Laterality: N/A;   Patient Active Problem List   Diagnosis Date Noted   Tibia fracture 01/26/2023   Long term (current) use of anticoagulants 06/07/2022   TIA (transient ischemic attack) 06/02/2022   Acute CVA (cerebrovascular accident) (HCC) 06/01/2022   Sinus bradycardia 06/01/2022   CKD (chronic kidney disease), stage II 06/01/2022   Cardiac akinesia 06/01/2022   LLQ pain 03/28/2021   BPH (benign prostatic hyperplasia) 03/28/2021   Spleen injury 03/28/2021   LV (left ventricular) mural thrombus 03/28/2021   Chronic combined systolic and diastolic heart failure (HCC) 03/28/2021   Abnormal CT scan, kidney 03/28/2021   NSTEMI (non-ST elevated myocardial infarction) (HCC) 03/27/2021   Chest pain 03/06/2017   Chest  pain with moderate risk for cardiac etiology    Unstable angina (HCC)    Ischemic cardiomyopathy 02/20/2017   Exertional angina (HCC) 10/05/2012   CAD (coronary artery disease) 12/19/2010   CIRCADIAN RHYTHM SLEEP DISORDER SHIFT WORK TYPE 03/10/2010   INADEQUATE SLEEP HYGIENE 08/20/2007   OSA (obstructive sleep apnea) 08/20/2007   Allergic rhinitis 08/20/2007   Asthma 08/20/2007   HLD (hyperlipidemia) 08/19/2007   HTN (hypertension) 08/19/2007   Acute myocardial infarction (HCC) 08/19/2007   Coronary atherosclerosis 08/19/2007    PCP: Clovis Riley, L.[name], MD  REFERRING PROVIDER: Barnetta Chapel,  [name]  REFERRING DIAG: Closed nondisplaced fracture of lateral condyle of right tibia, initial encounter  THERAPY DIAG:  Acute pain of right knee  Acute pain of left knee  Muscle weakness (generalized)  Rationale for Evaluation and Treatment: Rehabilitation  ONSET DATE: 01/20/2023   SUBJECTIVE:  SUBJECTIVE STATEMENT: Pt presents to PT with reports of no current pain or discomfort. Has been compliant with HEP.   PERTINENT HISTORY: Right tibial plateau and proximal fibula head fracture, left fibular fracture  PAIN:  Are you having pain? Yes:  NPRS scale: 0/10 Pain location: Bilateral knee Pain description: Stiff, uncomfortable Aggravating factors: Walking, activity  Relieving factors: Ice, medication  PRECAUTIONS: None  WEIGHT BEARING RESTRICTIONS: WBAT per patient  PATIENT GOALS: Pain relief, get back to walking and prior level of function   OBJECTIVE:  Note: Objective measures were completed at Evaluation unless otherwise noted. PATIENT SURVEYS:  FOTO 14% functional status  03/12/2023: 3%  EDEMA:  Patient does exhibit right lower leg edema compared to the left side   MUSCLE LENGTH: Limitation with bilateral hamstrings and calf  PALPATION: Generalized tenderness of right knee and lateral tenderness of left knee  LOWER EXTREMITY ROM:  Active ROM Right eval Left eval Right 02/26/2023 Rt / Lt 03/05/2023  Hip flexion      Hip extension      Hip abduction      Hip adduction      Hip internal rotation      Hip external rotation      Knee flexion 103 125 120 128 / 130  Knee extension Lacking 8 0 0   Ankle dorsiflexion      Ankle plantarflexion      Ankle inversion      Ankle eversion       (Blank rows = not tested)  LOWER EXTREMITY MMT:  MMT Right eval Left eval Rt / Lt 03/19/2023  Hip flexion 4 4   Hip extension     Hip abduction 2 3   Hip adduction     Hip internal rotation     Hip external rotation     Knee flexion 4- 4 4 / 5  Knee  extension 4 4 4  / 5  Ankle dorsiflexion     Ankle plantarflexion     Ankle inversion     Ankle eversion      (Blank rows = not tested)  FUNCTIONAL TESTS:  Not assessed  GAIT: Assistive device utilized: Walker - 2 wheeled Level of assistance: Modified independence Comments: TTWB on right  03/12/2023: WBAT on right using SPC   TODAY'S TREATMENT [facility] Adult PT Treatment:                                                DATE: 04/09/23 Therapeutic Exercise:  Recumbent Bike L5 x 4 minutes  Knee ext 15# 2x10 Knee flex 55# 2x10 Cybex leg press 3x8 120# Standing hip abd 2x10 37.5# TKE with blue power cord 2x10 each Step ups 2x10 fwd 8in each Lateral walk RTB x 4 laps STS 2x10 10# KB Bridge with blue band 2x15 S/L clamshell x 15 blue band TRX squat 2x10  [facility] Adult PT Treatment:                                                DATE: 04/04/23 Therapeutic Exercise: Recumbent Bike L5 x 5 minutes  Knee ext 15# 10 x 2  Knee flex 45# 10 x 2  Lateral walk BTB x 4 laps at counter STS 10# 10 x 2  Heel raises 10 x 2  Tandem stance on foam- head turns SLS >25 sec each on level surface  Leg press (cybex) 120# 2x10 8 inch runners  step up 10 x 2 each , without UE Bridge 10# 10 x 2  Side hip abduction to fatigue x 25  PATIENT EDUCATION:  Education details: HEP Person educated: Patient Education method: Programmer, multimedia, Facilities manager, Actor cues, Verbal cues Education comprehension: verbalized understanding, returned demonstration, verbal cues required, tactile cues required, and needs further education  HOME EXERCISE PROGRAM: Access Code: [email or web address]   ASSESSMENT: CLINICAL IMPRESSION: Pt was able to complete all prescribed exercises with no adverse effect. Therapy today kept progressing LE strengthening with continued emphasis on quad and lateral hip. He continues to benefit from skilled PT services working on improving strength and functional mobility, but has continued recent progression  well and may soon be at functional level for discharge.   OBJECTIVE IMPAIRMENTS: Abnormal gait, decreased activity tolerance, decreased balance, difficulty walking, decreased ROM, decreased strength, impaired flexibility, and pain.   ACTIVITY LIMITATIONS: carrying, lifting, standing, squatting, stairs, transfers, and locomotion level  PARTICIPATION LIMITATIONS: meal prep, cleaning, laundry, driving, shopping, community activity, and yard work  PERSONAL FACTORS: Fitness, Past/current experiences, and Time since onset of injury/illness/exacerbation are also affecting patient's functional outcome.    GOALS: Goals reviewed with patient? Yes  SHORT TERM GOALS: Target date: 03/12/2023  Patient will be I with initial HEP in order to progress with therapy. Baseline: HEP provided at eval 03/12/2023: independent Goal status: MET  2.  Patient will demonstrate right knee AROM 0-125 deg in order to improve his mobility Baseline: right knee AROM 8-103 deg 03/12/2023: 0-128 deg Goal status: MET  3.  Patient will report bilateral knee pain </= 3/10 in order to reduce functional limitations Baseline: 5-6/10 pain 03/12/2023: 3-5/10 pain 04/04/23: constant 2/10 Goal status: MET  LONG TERM GOALS: Target date: 04/09/2023  Patient will be I with final HEP to maintain progress from PT. Baseline: HEP provided at eval Goal status: INITIAL  2.  Patient will report >/= 46% status on FOTO to indicate improved functional ability. Baseline: 14% functional status 03/12/2023: 3% Goal status: ONGOING  3.  Patient will demonstrate bilateral knee strength 5/5 MMT in order to improve his walking and activity tolerance.  Baseline: see limitations above Goal status: INITIAL  4.  Patient will be able to ambulate at community level with no limitations or AD in order to return to prior level of function Baseline: patient limited with ambulation at household level with RW 04/04/23:  Goal status:  INITIAL   PLAN: PT FREQUENCY: 1-2x/week  PT DURATION: 8 weeks  PLANNED INTERVENTIONS: 97146- PT Re-evaluation, 97110-Therapeutic exercises, 97530- Therapeutic activity, [id number]- Neuromuscular re-education, 97535- Self Care, [id number]- Manual therapy, [id number]- Gait training, [phone]- Aquatic Therapy, 97014- Electrical stimulation (unattended), [phone]- Electrical stimulation (manual), Balance training, Stair training, Taping, Dry Needling, Joint mobilization, Joint manipulation, Cryotherapy, and Moist heat  PLAN FOR NEXT SESSION: Review HEP and progress PRN, gait training using cane, progress knee mobility and quad strengthening in open chain, hip strengthening   Eloy End PT  04/09/23 2:49 PM

## 2023-04-11 ENCOUNTER — Ambulatory Visit: Payer: Federal, State, Local not specified - PPO

## 2023-04-11 DIAGNOSIS — M25561 Pain in right knee: Secondary | ICD-10-CM

## 2023-04-11 DIAGNOSIS — R2689 Other abnormalities of gait and mobility: Secondary | ICD-10-CM | POA: Diagnosis not present

## 2023-04-11 DIAGNOSIS — M6281 Muscle weakness (generalized): Secondary | ICD-10-CM | POA: Diagnosis not present

## 2023-04-11 DIAGNOSIS — M25562 Pain in left knee: Secondary | ICD-10-CM

## 2023-04-11 NOTE — Therapy (Signed)
OUTPATIENT PHYSICAL THERAPY TREATMENT   Patient Name: Austin Ingram. MRN: [id number] DOB:[birth year], 62 y.o., male Today's Date: 04/11/2023   END OF SESSION:  PT End of Session - 04/11/23 1403     Visit Number 13    Number of Visits 17    Date for PT Re-Evaluation 04/09/23    Authorization Type BCBS    PT Start Time 1401    PT Stop Time 1440    PT Time Calculation (min) 39 min                        Past Medical History:  Diagnosis Date   Allergic rhinitis    Anxiety    Arthritis    "lower back, right thumb, knees" (10/04/2012)   Asthma    "grew out of it" (10/04/2012)   CKD (chronic kidney disease), stage II    Coronary artery disease    a. s/p prior stent to LAD;  b. LHC 6/14: DES to pRCA. c. DES to dCx 01/2017 with residual disease.   Depression    Hyperlipidemia    Hypertension    Ischemic cardiomyopathy    LV (left ventricular) mural thrombus    MI (myocardial infarction) (HCC) 03/2007   OSA (obstructive sleep apnea)    mild   Pneumonia    "as a child" (10/04/2012)   Sinus bradycardia    Splenic infarct    Past Surgical History:  Procedure Laterality Date   ARTHROPLASTY Right 1993   'crushed; removed bone fragments" (10/04/2012)   CARDIAC CATHETERIZATION  2010   CORONARY ANGIOPLASTY WITH STENT PLACEMENT  2008; 10/04/2012   "1 + 1" (10/04/2012)   CORONARY PRESSURE/FFR STUDY N/A 02/26/2017   Procedure: INTRAVASCULAR PRESSURE WIRE/FFR STUDY;  Surgeon: Kathleene Hazel, MD;  Location: [facility];  Service: Cardiovascular;  Laterality: N/A;   CORONARY STENT INTERVENTION N/A 02/26/2017   Procedure: CORONARY STENT INTERVENTION;  Surgeon: Kathleene Hazel, MD;  Location: [facility];  Service: Cardiovascular;  Laterality: N/A;   EXPLORATORY LAPAROTOMY  1990's?   "went in to repair hernia; found fatty tissue instead; no hernia repair" (10/04/2012)   LEFT HEART CATH AND CORONARY ANGIOGRAPHY N/A 02/26/2017   Procedure: LEFT HEART  CATH AND CORONARY ANGIOGRAPHY;  Surgeon: Kathleene Hazel, MD;  Location: [facility];  Service: Cardiovascular;  Laterality: N/A;   LEFT HEART CATH AND CORONARY ANGIOGRAPHY N/A 03/29/2021   Procedure: LEFT HEART CATH AND CORONARY ANGIOGRAPHY;  Surgeon: Swazil[place], Peter M, MD;  Location: [facility] INVASIVE CV LAB;  Service: Cardiovascular;  Laterality: N/A;   LEFT HEART CATHETERIZATION WITH CORONARY ANGIOGRAM N/A 10/04/2012   Procedure: LEFT HEART CATHETERIZATION WITH CORONARY ANGIOGRAM;  Surgeon: Tonny Bollman, MD;  Location: [facility] CATH LAB;  Service: Cardiovascular;  Laterality: N/A;   Patient Active Problem List   Diagnosis Date Noted   Tibia fracture 01/26/2023   Long term (current) use of anticoagulants 06/07/2022   TIA (transient ischemic attack) 06/02/2022   Acute CVA (cerebrovascular accident) (HCC) 06/01/2022   Sinus bradycardia 06/01/2022   CKD (chronic kidney disease), stage II 06/01/2022   Cardiac akinesia 06/01/2022   LLQ pain 03/28/2021   BPH (benign prostatic hyperplasia) 03/28/2021   Spleen injury 03/28/2021   LV (left ventricular) mural thrombus 03/28/2021   Chronic combined systolic and diastolic heart failure (HCC) 03/28/2021   Abnormal CT scan, kidney 03/28/2021   NSTEMI (non-ST elevated myocardial infarction) (HCC) 03/27/2021   Chest pain 03/06/2017  Chest pain with moderate risk for cardiac etiology    Unstable angina (HCC)    Ischemic cardiomyopathy 02/20/2017   Exertional angina (HCC) 10/05/2012   CAD (coronary artery disease) 12/19/2010   CIRCADIAN RHYTHM SLEEP DISORDER SHIFT WORK TYPE 03/10/2010   INADEQUATE SLEEP HYGIENE 08/20/2007   OSA (obstructive sleep apnea) 08/20/2007   Allergic rhinitis 08/20/2007   Asthma 08/20/2007   HLD (hyperlipidemia) 08/19/2007   HTN (hypertension) 08/19/2007   Acute myocardial infarction (HCC) 08/19/2007   Coronary atherosclerosis 08/19/2007    PCP: Clovis Riley, L.[name], MD  REFERRING PROVIDER: Barnetta Chapel,  [name]  REFERRING DIAG: Closed nondisplaced fracture of lateral condyle of right tibia, initial encounter  THERAPY DIAG:  Acute pain of right knee  Acute pain of left knee  Muscle weakness (generalized)  Other abnormalities of gait and mobility  Rationale for Evaluation and Treatment: Rehabilitation  ONSET DATE: 01/20/2023   SUBJECTIVE:  SUBJECTIVE STATEMENT: Pt presents to PT with reports of R knee stiffness, otherwise is doing well. Has been compliant with HEP.   PERTINENT HISTORY: Right tibial plateau and proximal fibula head fracture, left fibular fracture  PAIN:  Are you having pain? Yes:  NPRS scale: 0/10 Pain location: Bilateral knee Pain description: Stiff, uncomfortable Aggravating factors: Walking, activity  Relieving factors: Ice, medication  PRECAUTIONS: None  WEIGHT BEARING RESTRICTIONS: WBAT per patient  PATIENT GOALS: Pain relief, get back to walking and prior level of function   OBJECTIVE:  Note: Objective measures were completed at Evaluation unless otherwise noted. PATIENT SURVEYS:  FOTO 14% functional status  03/12/2023: 3%  EDEMA:  Patient does exhibit right lower leg edema compared to the left side   MUSCLE LENGTH: Limitation with bilateral hamstrings and calf  PALPATION: Generalized tenderness of right knee and lateral tenderness of left knee  LOWER EXTREMITY ROM:  Active ROM Right eval Left eval Right 02/26/2023 Rt / Lt 03/05/2023  Hip flexion      Hip extension      Hip abduction      Hip adduction      Hip internal rotation      Hip external rotation      Knee flexion 103 125 120 128 / 130  Knee extension Lacking 8 0 0   Ankle dorsiflexion      Ankle plantarflexion      Ankle inversion      Ankle eversion       (Blank rows = not tested)  LOWER EXTREMITY MMT:  MMT Right eval Left eval Rt / Lt 03/19/2023  Hip flexion 4 4   Hip extension     Hip abduction 2 3   Hip adduction     Hip internal rotation     Hip  external rotation     Knee flexion 4- 4 4 / 5  Knee extension 4 4 4  / 5  Ankle dorsiflexion     Ankle plantarflexion     Ankle inversion     Ankle eversion      (Blank rows = not tested)  FUNCTIONAL TESTS:  Not assessed  GAIT: Assistive device utilized: Walker - 2 wheeled Level of assistance: Modified independence Comments: TTWB on right  03/12/2023: WBAT on right using SPC   TODAY'S TREATMENT [facility] Adult PT Treatment:  DATE: 04/11/23 Therapeutic Exercise: NuStep lvl 6 LE only x 3 min while taking subjective Knee ext 15# 2x10 Knee flex 55# 2x10 TRX squat 2x10 Cybex leg press 3x8 120# Standing hip abd 2x10 42.5# Step ups 2x10 fwd 8in each holding 10# KB each hand Lateral walk RTB x 3 laps Monster walk RTB x 2 laps at counter  [facility] Adult PT Treatment:                                                DATE: 04/09/23 Therapeutic Exercise: Recumbent Bike L5 x 4 minutes  Knee ext 15# 2x10 Knee flex 55# 2x10 Cybex leg press 3x8 120# Standing hip abd 2x10 37.5# TKE with blue power cord 2x10 each Step ups 2x10 fwd 8in each Lateral walk RTB x 4 laps STS 2x10 10# KB Bridge with blue band 2x15 S/L clamshell x 15 blue band TRX squat 2x10  [facility] Adult PT Treatment:                                                DATE: 04/04/23 Therapeutic Exercise: Recumbent Bike L5 x 5 minutes  Knee ext 15# 10 x 2  Knee flex 45# 10 x 2  Lateral walk BTB x 4 laps at counter STS 10# 10 x 2  Heel raises 10 x 2  Tandem stance on foam- head turns SLS >25 sec each on level surface  Leg press (cybex) 120# 2x10 8 inch runners  step up 10 x 2 each , without UE Bridge 10# 10 x 2  Side hip abduction to fatigue x 25  PATIENT EDUCATION:  Education details: HEP Person educated: Patient Education method: Programmer, multimedia, Facilities manager, Actor cues, Verbal cues Education comprehension: verbalized understanding, returned demonstration, verbal cues required,  tactile cues required, and needs further education  HOME EXERCISE PROGRAM: Access Code: [email or web address]   ASSESSMENT: CLINICAL IMPRESSION: Pt was able to complete all prescribed exercises with no adverse effect. Therapy today kept progressing LE strengthening with continued emphasis on quad and lateral hip. He continues to benefit from skilled PT services working on improving strength and functional mobility. Will continue per POC.   OBJECTIVE IMPAIRMENTS: Abnormal gait, decreased activity tolerance, decreased balance, difficulty walking, decreased ROM, decreased strength, impaired flexibility, and pain.   ACTIVITY LIMITATIONS: carrying, lifting, standing, squatting, stairs, transfers, and locomotion level  PARTICIPATION LIMITATIONS: meal prep, cleaning, laundry, driving, shopping, community activity, and yard work  PERSONAL FACTORS: Fitness, Past/current experiences, and Time since onset of injury/illness/exacerbation are also affecting patient's functional outcome.    GOALS: Goals reviewed with patient? Yes  SHORT TERM GOALS: Target date: 03/12/2023  Patient will be I with initial HEP in order to progress with therapy. Baseline: HEP provided at eval 03/12/2023: independent Goal status: MET  2.  Patient will demonstrate right knee AROM 0-125 deg in order to improve his mobility Baseline: right knee AROM 8-103 deg 03/12/2023: 0-128 deg Goal status: MET  3.  Patient will report bilateral knee pain </= 3/10 in order to reduce functional limitations Baseline: 5-6/10 pain 03/12/2023: 3-5/10 pain 04/04/23: constant 2/10 Goal status: MET  LONG TERM GOALS: Target date: 04/09/2023  Patient will be I with final HEP to maintain progress from PT. Baseline: HEP provided  at eval Goal status: INITIAL  2.  Patient will report >/= 46% status on FOTO to indicate improved functional ability. Baseline: 14% functional status 03/12/2023: 3% Goal status: ONGOING  3.  Patient will demonstrate  bilateral knee strength 5/5 MMT in order to improve his walking and activity tolerance.  Baseline: see limitations above Goal status: INITIAL  4.  Patient will be able to ambulate at community level with no limitations or AD in order to return to prior level of function Baseline: patient limited with ambulation at household level with RW 04/04/23:  Goal status: INITIAL   PLAN: PT FREQUENCY: 1-2x/week  PT DURATION: 8 weeks  PLANNED INTERVENTIONS: 97146- PT Re-evaluation, 97110-Therapeutic exercises, 97530- Therapeutic activity, 97112- Neuromuscular re-education, 97535- Self Care, [id number]- Manual therapy, [id number]- Gait training, [phone]- Aquatic Therapy, 97014- Electrical stimulation (unattended), [phone]- Electrical stimulation (manual), Balance training, Stair training, Taping, Dry Needling, Joint mobilization, Joint manipulation, Cryotherapy, and Moist heat  PLAN FOR NEXT SESSION: Review HEP and progress PRN, gait training using cane, progress knee mobility and quad strengthening in open chain, hip strengthening   Eloy End PT  04/11/23 2:42 PM

## 2023-04-13 DIAGNOSIS — H04123 Dry eye syndrome of bilateral lacrimal glands: Secondary | ICD-10-CM | POA: Diagnosis not present

## 2023-04-13 DIAGNOSIS — H5310 Unspecified subjective visual disturbances: Secondary | ICD-10-CM | POA: Diagnosis not present

## 2023-04-13 DIAGNOSIS — H43393 Other vitreous opacities, bilateral: Secondary | ICD-10-CM | POA: Diagnosis not present

## 2023-04-13 DIAGNOSIS — H2513 Age-related nuclear cataract, bilateral: Secondary | ICD-10-CM | POA: Diagnosis not present

## 2023-04-16 ENCOUNTER — Ambulatory Visit: Payer: Federal, State, Local not specified - PPO

## 2023-04-16 DIAGNOSIS — M25562 Pain in left knee: Secondary | ICD-10-CM

## 2023-04-16 DIAGNOSIS — M25561 Pain in right knee: Secondary | ICD-10-CM | POA: Diagnosis not present

## 2023-04-16 DIAGNOSIS — R2689 Other abnormalities of gait and mobility: Secondary | ICD-10-CM | POA: Diagnosis not present

## 2023-04-16 DIAGNOSIS — M6281 Muscle weakness (generalized): Secondary | ICD-10-CM | POA: Diagnosis not present

## 2023-04-16 NOTE — Therapy (Addendum)
 OUTPATIENT PHYSICAL THERAPY TREATMENT  DISCHARGE   Patient Name: Austin Ingram. MRN: [id number] DOB:[birth year], 62 y.o., male Today's Date: 04/16/2023   END OF SESSION:  PT End of Session - 04/16/23 1358     Visit Number 14    Number of Visits 17    Date for PT Re-Evaluation 04/09/23    Authorization Type [employer]    PT Start Time 1400    PT Stop Time 1438    PT Time Calculation (min) 38 min                         Past Medical History:  Diagnosis Date   Allergic rhinitis    Anxiety    Arthritis    "lower back, right thumb, knees" (10/04/2012)   Asthma    "grew out of it" (10/04/2012)   CKD (chronic kidney disease), stage II    Coronary artery disease    a. s/p prior stent to LAD;  b. LHC 6/14: DES to pRCA. c. DES to dCx 01/2017 with residual disease.   Depression    Hyperlipidemia    Hypertension    Ischemic cardiomyopathy    LV (left ventricular) mural thrombus    MI (myocardial infarction) (HCC) 03/2007   OSA (obstructive sleep apnea)    mild   Pneumonia    "as a child" (10/04/2012)   Sinus bradycardia    Splenic infarct    Past Surgical History:  Procedure Laterality Date   ARTHROPLASTY Right 1993   'crushed; removed bone fragments" (10/04/2012)   CARDIAC CATHETERIZATION  2010   CORONARY ANGIOPLASTY WITH STENT PLACEMENT  2008; 10/04/2012   "1 + 1" (10/04/2012)   CORONARY PRESSURE/FFR STUDY N/A 02/26/2017   Procedure: INTRAVASCULAR PRESSURE WIRE/FFR STUDY;  Surgeon: Kathleene Hazel, MD;  Location: [facility] IN[facility];  Service: Cardiovascular;  Laterality: N/A;   CORONARY STENT INTERVENTION N/A 02/26/2017   Procedure: CORONARY STENT INTERVENTION;  Surgeon: Kathleene Hazel, MD;  Location: [facility];  Service: Cardiovascular;  Laterality: N/A;   EXPLORATORY LAPAROTOMY  1990's?   "went in to repair hernia; found fatty tissue instead; no hernia repair" (10/04/2012)   LEFT HEART CATH AND CORONARY ANGIOGRAPHY N/A 02/26/2017    Procedure: LEFT HEART CATH AND CORONARY ANGIOGRAPHY;  Surgeon: Kathleene Hazel, MD;  Location: [facility];  Service: Cardiovascular;  Laterality: N/A;   LEFT HEART CATH AND CORONARY ANGIOGRAPHY N/A 03/29/2021   Procedure: LEFT HEART CATH AND CORONARY ANGIOGRAPHY;  Surgeon: Swaziland, Peter M, MD;  Location: [facility] INVASIVE CV LAB;  Service: Cardiovascular;  Laterality: N/A;   LEFT HEART CATHETERIZATION WITH CORONARY ANGIOGRAM N/A 10/04/2012   Procedure: LEFT HEART CATHETERIZATION WITH CORONARY ANGIOGRAM;  Surgeon: Tonny Bollman, MD;  Location: [facility] CATH LAB;  Service: Cardiovascular;  Laterality: N/A;   Patient Active Problem List   Diagnosis Date Noted   Tibia fracture 01/26/2023   Long term (current) use of anticoagulants 06/07/2022   TIA (transient ischemic attack) 06/02/2022   Acute CVA (cerebrovascular accident) (HCC) 06/01/2022   Sinus bradycardia 06/01/2022   CKD (chronic kidney disease), stage II 06/01/2022   Cardiac akinesia 06/01/2022   LLQ pain 03/28/2021   BPH (benign prostatic hyperplasia) 03/28/2021   Spleen injury 03/28/2021   LV (left ventricular) mural thrombus 03/28/2021   Chronic combined systolic and diastolic heart failure (HCC) 03/28/2021   Abnormal CT scan, kidney 03/28/2021   NSTEMI (non-ST elevated myocardial infarction) (HCC) 03/27/2021   Chest pain  03/06/2017   Chest pain with moderate risk for cardiac etiology    Unstable angina (HCC)    Ischemic cardiomyopathy 02/20/2017   Exertional angina (HCC) 10/05/2012   CAD (coronary artery disease) 12/19/2010   CIRCADIAN RHYTHM SLEEP DISORDER SHIFT WORK TYPE 03/10/2010   INADEQUATE SLEEP HYGIENE 08/20/2007   OSA (obstructive sleep apnea) 08/20/2007   Allergic rhinitis 08/20/2007   Asthma 08/20/2007   HLD (hyperlipidemia) 08/19/2007   HTN (hypertension) 08/19/2007   Acute myocardial infarction (HCC) 08/19/2007   Coronary atherosclerosis 08/19/2007    PCP: Clovis Riley, L.[name], MD  REFERRING PROVIDER:  Barnetta Chapel, [name]  REFERRING DIAG: Closed nondisplaced fracture of lateral condyle of right tibia, initial encounter  THERAPY DIAG:  Acute pain of right knee  Acute pain of left knee  Muscle weakness (generalized)  Rationale for Evaluation and Treatment: Rehabilitation  ONSET DATE: 01/20/2023   SUBJECTIVE:  SUBJECTIVE STATEMENT: Pt presents to PT with reports of stiffness. Has been compliant with HEP.   PERTINENT HISTORY: Right tibial plateau and proximal fibula head fracture, left fibular fracture  PAIN:  Are you having pain? Yes:  NPRS scale: 0/10 Pain location: Bilateral knee Pain description: Stiff, uncomfortable Aggravating factors: Walking, activity  Relieving factors: Ice, medication  PRECAUTIONS: None  WEIGHT BEARING RESTRICTIONS: WBAT per patient  PATIENT GOALS: Pain relief, get back to walking and prior level of function   OBJECTIVE:  Note: Objective measures were completed at Evaluation unless otherwise noted. PATIENT SURVEYS:  FOTO 14% functional status  03/12/2023: 3%  EDEMA:  Patient does exhibit right lower leg edema compared to the left side   MUSCLE LENGTH: Limitation with bilateral hamstrings and calf  PALPATION: Generalized tenderness of right knee and lateral tenderness of left knee  LOWER EXTREMITY ROM:  Active ROM Right eval Left eval Right 02/26/2023 Rt / Lt 03/05/2023  Hip flexion      Hip extension      Hip abduction      Hip adduction      Hip internal rotation      Hip external rotation      Knee flexion 103 125 120 128 / 130  Knee extension Lacking 8 0 0   Ankle dorsiflexion      Ankle plantarflexion      Ankle inversion      Ankle eversion       (Blank rows = not tested)  LOWER EXTREMITY MMT:  MMT Right eval Left eval Rt / Lt 03/19/2023  Hip flexion 4 4   Hip extension     Hip abduction 2 3   Hip adduction     Hip internal rotation     Hip external rotation     Knee flexion 4- 4 4 / 5  Knee extension  4 4 4  / 5  Ankle dorsiflexion     Ankle plantarflexion     Ankle inversion     Ankle eversion      (Blank rows = not tested)  FUNCTIONAL TESTS:  Not assessed  GAIT: Assistive device utilized: Walker - 2 wheeled Level of assistance: Modified independence Comments: TTWB on right  03/12/2023: WBAT on right using SPC   TODAY'S TREATMENT [facility] Adult PT Treatment:                                                DATE: 04/16/23 Therapeutic Exercise:  Rec bike lvl 4 x 4 min while taking subjective Knee ext 15# 2x10 Knee flex 55# 3x10 TRX squat 2x10 Cybex leg press 3x10 120# Standing hip abd/ext 2x10 42.5# Step ups 2x10 fwd 10in each holding 10# KB each hand Eccentric heel tap 2x8 4in each Lateral walk GTB x 4 laps Single leg press 2x10 40#  [facility] Adult PT Treatment:                                                DATE: 04/11/23 Therapeutic Exercise: NuStep lvl 6 LE only x 3 min while taking subjective Knee ext 15# 2x10 Knee flex 55# 2x10 TRX squat 2x10 Cybex leg press 3x8 120# Standing hip abd 2x10 42.5# Step ups 2x10 fwd 8in each holding 10# KB each hand Lateral walk RTB x 3 laps Monster walk RTB x 2 laps at counter  [facility] Adult PT Treatment:                                                DATE: 04/09/23 Therapeutic Exercise: Recumbent Bike L5 x 4 minutes  Knee ext 15# 2x10 Knee flex 55# 2x10 Cybex leg press 3x8 120# Standing hip abd 2x10 37.5# TKE with blue power cord 2x10 each Step ups 2x10 fwd 8in each Lateral walk RTB x 4 laps STS 2x10 10# KB Bridge with blue band 2x15 S/L clamshell x 15 blue band TRX squat 2x10  [facility] Adult PT Treatment:                                                DATE: 04/04/23 Therapeutic Exercise: Recumbent Bike L5 x 5 minutes  Knee ext 15# 10 x 2  Knee flex 45# 10 x 2  Lateral walk BTB x 4 laps at counter STS 10# 10 x 2  Heel raises 10 x 2  Tandem stance on foam- head turns SLS >25 sec each on level surface  Leg press (cybex) 120# 2x10 8  inch runners  step up 10 x 2 each , without UE Bridge 10# 10 x 2  Side hip abduction to fatigue x 25  PATIENT EDUCATION:  Education details: HEP Person educated: Patient Education method: Programmer, multimedia, Facilities manager, Actor cues, Verbal cues Education comprehension: verbalized understanding, returned demonstration, verbal cues required, tactile cues required, and needs further education  HOME EXERCISE PROGRAM: Access Code: [email or web address]   ASSESSMENT: CLINICAL IMPRESSION: Pt was able to complete all prescribed exercises with no adverse effect. Therapy today kept progressing LE strengthening with continued emphasis on quad and lateral hip. Eccentrics were difficult with isolating R LE. He continues to benefit from skilled PT services working on improving strength and functional mobility. Will continue per POC and assess goals next session.   OBJECTIVE IMPAIRMENTS: Abnormal gait, decreased activity tolerance, decreased balance, difficulty walking, decreased ROM, decreased strength, impaired flexibility, and pain.   ACTIVITY LIMITATIONS: carrying, lifting, standing, squatting, stairs, transfers, and locomotion level  PARTICIPATION LIMITATIONS: meal prep, cleaning, laundry, driving, shopping, community activity, and yard work  PERSONAL FACTORS: Fitness, Past/current experiences, and Time since onset of injury/illness/exacerbation are also affecting patient's  functional outcome.    GOALS: Goals reviewed with patient? Yes  SHORT TERM GOALS: Target date: 03/12/2023  Patient will be I with initial HEP in order to progress with therapy. Baseline: HEP provided at eval 03/12/2023: independent Goal status: MET  2.  Patient will demonstrate right knee AROM 0-125 deg in order to improve his mobility Baseline: right knee AROM 8-103 deg 03/12/2023: 0-128 deg Goal status: MET  3.  Patient will report bilateral knee pain </= 3/10 in order to reduce functional limitations Baseline: 5-6/10  pain 03/12/2023: 3-5/10 pain 04/04/23: constant 2/10 Goal status: MET  LONG TERM GOALS: Target date: 04/09/2023  Patient will be I with final HEP to maintain progress from PT. Baseline: HEP provided at eval Goal status: INITIAL  2.  Patient will report >/= 46% status on FOTO to indicate improved functional ability. Baseline: 14% functional status 03/12/2023: 3% Goal status: ONGOING  3.  Patient will demonstrate bilateral knee strength 5/5 MMT in order to improve his walking and activity tolerance.  Baseline: see limitations above Goal status: INITIAL  4.  Patient will be able to ambulate at community level with no limitations or AD in order to return to prior level of function Baseline: patient limited with ambulation at household level with RW 04/04/23:  Goal status: INITIAL   PLAN: PT FREQUENCY: 1-2x/week  PT DURATION: 8 weeks  PLANNED INTERVENTIONS: 97146- PT Re-evaluation, 97110-Therapeutic exercises, 97530- Therapeutic activity, 97112- Neuromuscular re-education, 97535- Self Care, 45409- Manual therapy, [id number]- Gait training, [phone]- Aquatic Therapy, 97014- Electrical stimulation (unattended), [phone]- Electrical stimulation (manual), Balance training, Stair training, Taping, Dry Needling, Joint mobilization, Joint manipulation, Cryotherapy, and Moist heat  PLAN FOR NEXT SESSION: Review HEP and progress PRN, gait training using cane, progress knee mobility and quad strengthening in open chain, hip strengthening   Eloy End PT  04/16/23 2:41 PM   PHYSICAL THERAPY DISCHARGE SUMMARY  Visits from Start of Care: 14  Current functional level related to goals / functional outcomes: See above   Remaining deficits: See above   Education / Equipment: HEP   Patient agrees to discharge. Patient goals were not met. Patient is being discharged due to not returning since the last visit.  Rosana Hoes, PT, DPT, LAT, ATC 07/11/23  3:34 PM Phone: [phone] Fax:  [phone]

## 2023-04-17 NOTE — Therapy (Deleted)
OUTPATIENT PHYSICAL THERAPY TREATMENT   Patient Name: Austin Ingram. MRN: [id number] DOB:17-Jun-1960, 62 y.o., male Today's Date: 04/17/2023   END OF SESSION:                Past Medical History:  Diagnosis Date   Allergic rhinitis    Anxiety    Arthritis    "lower back, right thumb, knees" (10/04/2012)   Asthma    "grew out of it" (10/04/2012)   CKD (chronic kidney disease), stage II    Coronary artery disease    a. s/p prior stent to LAD;  b. LHC 6/14: DES to pRCA. c. DES to dCx 01/2017 with residual disease.   Depression    Hyperlipidemia    Hypertension    Ischemic cardiomyopathy    LV (left ventricular) mural thrombus    MI (myocardial infarction) (HCC) 03/2007   OSA (obstructive sleep apnea)    mild   Pneumonia    "as a child" (10/04/2012)   Sinus bradycardia    Splenic infarct    Past Surgical History:  Procedure Laterality Date   ARTHROPLASTY Right 1993   'crushed; removed bone fragments" (10/04/2012)   CARDIAC CATHETERIZATION  2010   CORONARY ANGIOPLASTY WITH STENT PLACEMENT  2008; 10/04/2012   "1 + 1" (10/04/2012)   CORONARY PRESSURE/FFR STUDY N/A 02/26/2017   Procedure: INTRAVASCULAR PRESSURE WIRE/FFR STUDY;  Surgeon: Kathleene Hazel, MD;  Location: [facility] INVASIVE CV LAB;  Service: Cardiovascular;  Laterality: N/A;   CORONARY STENT INTERVENTION N/A 02/26/2017   Procedure: CORONARY STENT INTERVENTION;  Surgeon: Kathleene Hazel, MD;  Location: [facility] INVAS[facility] CV [facility];  Service: Cardiovascular;  Laterality: N/A;   EXPLORATORY LAPAROTOMY  1990's?   "went in to repair hernia; found fatty tissue instead; no hernia repair" (10/04/2012)   LEFT HEART CATH AND CORONARY ANGIOGRAPHY N/A 02/26/2017   Procedure: LEFT HEART CATH AND CORONARY ANGIOGRAPHY;  Surgeon: Kathleene Hazel, MD;  Location: [facility];  Service: Cardiovascular;  Laterality: N/A;   LEFT HEART CATH AND CORONARY ANGIOGRAPHY N/A 03/29/2021   Procedure: LEFT HEART CATH AND  CORONARY ANGIOGRAPHY;  Surgeon: Swaziland, Peter M, MD;  Location: [facility] INVASIVE CV LAB;  Service: Cardiovascular;  Laterality: N/A;   LEFT HEART CATHETERIZATION WITH CORONARY ANGIOGRAM N/A 10/04/2012   Procedure: LEFT HEART CATHETERIZATION WITH CORONARY ANGIOGRAM;  Surgeon: Tonny Bollman, MD;  Location: [facility] CATH LAB;  Service: Cardiovascular;  Laterality: N/A;   Patient Active Problem List   Diagnosis Date Noted   Tibia fracture 01/26/2023   Long term (current) use of anticoagulants 06/07/2022   TIA (transient ischemic attack) 06/02/2022   Acute CVA (cerebrovascular accident) (HCC) 06/01/2022   Sinus bradycardia 06/01/2022   CKD (chronic kidney disease), stage II 06/01/2022   Cardiac akinesia 06/01/2022   LLQ pain 03/28/2021   BPH (benign prostatic hyperplasia) 03/28/2021   Spleen injury 03/28/2021   LV (left ventricular) mural thrombus 03/28/2021   Chronic combined systolic and diastolic heart failure (HCC) 03/28/2021   Abnormal CT scan, kidney 03/28/2021   NSTEMI (non-ST elevated myocardial infarction) (HCC) 03/27/2021   Chest pain 03/06/2017   Chest pain with moderate risk for cardiac etiology    Unstable angina (HCC)    Ischemic cardiomyopathy 02/20/2017   Exertional angina (HCC) 10/05/2012   CAD (coronary artery disease) 12/19/2010   CIRCADIAN RHYTHM SLEEP DISORDER SHIFT WORK TYPE 03/10/2010   INADEQUATE SLEEP HYGIENE 08/20/2007   OSA (obstructive sleep apnea) 08/20/2007   Allergic rhinitis 08/20/2007   Asthma 08/20/2007  HLD (hyperlipidemia) 08/19/2007   HTN (hypertension) 08/19/2007   Acute myocardial infarction [facility]) 08/19/2007   Coronary atherosclerosis 08/19/2007    PCP: Clovis Riley, L.[name], MD  REFERRING PROVIDER: Barnetta Chapel, [name]  REFERRING DIAG: Closed nondisplaced fracture of lateral condyle of right tibia, initial encounter  THERAPY DIAG:  No diagnosis found.  Rationale for Evaluation and Treatment: Rehabilitation  ONSET DATE: 01/20/2023   SUBJECTIVE:   SUBJECTIVE STATEMENT: Pt presents to PT with reports of stiffness. Has been compliant with HEP.   PERTINENT HISTORY: Right tibial plateau and proximal fibula head fracture, left fibular fracture  PAIN:  Are you having pain? Yes:  NPRS scale: 0/10 Pain location: Bilateral knee Pain description: Stiff, uncomfortable Aggravating factors: Walking, activity  Relieving factors: Ice, medication  PRECAUTIONS: None  WEIGHT BEARING RESTRICTIONS: WBAT per patient  PATIENT GOALS: Pain relief, get back to walking and prior level of function   OBJECTIVE:  Note: Objective measures were completed at Evaluation unless otherwise noted. PATIENT SURVEYS:  FOTO 14% functional status  03/12/2023: 3%  EDEMA:  Patient does exhibit right lower leg edema compared to the left side   MUSCLE LENGTH: Limitation with bilateral hamstrings and calf  PALPATION: Generalized tenderness of right knee and lateral tenderness of left knee  LOWER EXTREMITY ROM:  Active ROM Right eval Left eval Right 02/26/2023 Rt / Lt 03/05/2023  Hip flexion      Hip extension      Hip abduction      Hip adduction      Hip internal rotation      Hip external rotation      Knee flexion 103 125 120 128 / 130  Knee extension Lacking 8 0 0   Ankle dorsiflexion      Ankle plantarflexion      Ankle inversion      Ankle eversion       (Blank rows = not tested)  LOWER EXTREMITY MMT:  MMT Right eval Left eval Rt / Lt 03/19/2023  Hip flexion 4 4   Hip extension     Hip abduction 2 3   Hip adduction     Hip internal rotation     Hip external rotation     Knee flexion 4- 4 4 / 5  Knee extension 4 4 4  / 5  Ankle dorsiflexion     Ankle plantarflexion     Ankle inversion     Ankle eversion      (Blank rows = not tested)  FUNCTIONAL TESTS:  Not assessed  GAIT: Assistive device utilized: Environmental consultant - 2 wheeled Level of assistance: Modified independence Comments: TTWB on right  03/12/2023: WBAT on right using  SPC   TODAY'S TREATMENT [facility] Adult PT Treatment:                                                DATE: 04/16/23 Therapeutic Exercise: Rec bike lvl 4 x 4 min while taking subjective Knee ext 15# 2x10 Knee flex 55# 3x10 TRX squat 2x10 Cybex leg press 3x10 120# Standing hip abd/ext 2x10 42.5# Step ups 2x10 fwd 10in each holding 10# KB each hand Eccentric heel tap 2x8 4in each Lateral walk GTB x 4 laps Single leg press 2x10 40#  [facility] Adult PT Treatment:  DATE: 04/11/23 Therapeutic Exercise: NuStep lvl 6 LE only x 3 min while taking subjective Knee ext 15# 2x10 Knee flex 55# 2x10 TRX squat 2x10 Cybex leg press 3x8 120# Standing hip abd 2x10 42.5# Step ups 2x10 fwd 8in each holding 10# KB each hand Lateral walk RTB x 3 laps Monster walk RTB x 2 laps at counter  [facility] Adult PT Treatment:                                                DATE: 04/09/23 Therapeutic Exercise: Recumbent Bike L5 x 4 minutes  Knee ext 15# 2x10 Knee flex 55# 2x10 Cybex leg press 3x8 120# Standing hip abd 2x10 37.5# TKE with blue power cord 2x10 each Step ups 2x10 fwd 8in each Lateral walk RTB x 4 laps STS 2x10 10# KB Bridge with blue band 2x15 S/L clamshell x 15 blue band TRX squat 2x10  [facility] Adult PT Treatment:                                                DATE: 04/04/23 Therapeutic Exercise: Recumbent Bike L5 x 5 minutes  Knee ext 15# 10 x 2  Knee flex 45# 10 x 2  Lateral walk BTB x 4 laps at counter STS 10# 10 x 2  Heel raises 10 x 2  Tandem stance on foam- head turns SLS >25 sec each on level surface  Leg press (cybex) 120# 2x10 8 inch runners  step up 10 x 2 each , without UE Bridge 10# 10 x 2  Side hip abduction to fatigue x 25  PATIENT EDUCATION:  Education details: HEP Person educated: Patient Education method: Programmer, multimedia, Facilities manager, Actor cues, Verbal cues Education comprehension: verbalized understanding, returned demonstration,  verbal cues required, tactile cues required, and needs further education  HOME EXERCISE PROGRAM: Access Code: [email or web address]   ASSESSMENT: CLINICAL IMPRESSION: Pt was able to complete all prescribed exercises with no adverse effect. Therapy today kept progressing LE strengthening with continued emphasis on quad and lateral hip. Eccentrics were difficult with isolating R LE. He continues to benefit from skilled PT services working on improving strength and functional mobility. Will continue per POC and assess goals next session.   OBJECTIVE IMPAIRMENTS: Abnormal gait, decreased activity tolerance, decreased balance, difficulty walking, decreased ROM, decreased strength, impaired flexibility, and pain.   ACTIVITY LIMITATIONS: carrying, lifting, standing, squatting, stairs, transfers, and locomotion level  PARTICIPATION LIMITATIONS: meal prep, cleaning, laundry, driving, shopping, community activity, and yard work  PERSONAL FACTORS: Fitness, Past/current experiences, and Time since onset of injury/illness/exacerbation are also affecting patient's functional outcome.    GOALS: Goals reviewed with patient? Yes  SHORT TERM GOALS: Target date: 03/12/2023  Patient will be I with initial HEP in order to progress with therapy. Baseline: HEP provided at eval 03/12/2023: independent Goal status: MET  2.  Patient will demonstrate right knee AROM 0-125 deg in order to improve his mobility Baseline: right knee AROM 8-103 deg 03/12/2023: 0-128 deg Goal status: MET  3.  Patient will report bilateral knee pain </= 3/10 in order to reduce functional limitations Baseline: 5-6/10 pain 03/12/2023: 3-5/10 pain 04/04/23: constant 2/10 Goal status: MET  LONG TERM GOALS: Target date: 04/09/2023  Patient will be  I with final HEP to maintain progress from PT. Baseline: HEP provided at eval Goal status: INITIAL  2.  Patient will report >/= 46% status on FOTO to indicate improved functional  ability. Baseline: 14% functional status 03/12/2023: 3% Goal status: ONGOING  3.  Patient will demonstrate bilateral knee strength 5/5 MMT in order to improve his walking and activity tolerance.  Baseline: see limitations above Goal status: INITIAL  4.  Patient will be able to ambulate at community level with no limitations or AD in order to return to prior level of function Baseline: patient limited with ambulation at household level with RW 04/04/23:  Goal status: INITIAL   PLAN: PT FREQUENCY: 1-2x/week  PT DURATION: 8 weeks  PLANNED INTERVENTIONS: 97146- PT Re-evaluation, 97110-Therapeutic exercises, 97530- Therapeutic activity, 97112- Neuromuscular re-education, 97535- Self Care, 81191- Manual therapy, [id number]- Gait training, [phone]- Aquatic Therapy, 97014- Electrical stimulation (unattended), [phone]- Electrical stimulation (manual), Balance training, Stair training, Taping, Dry Needling, Joint mobilization, Joint manipulation, Cryotherapy, and Moist heat  PLAN FOR NEXT SESSION: Review HEP and progress PRN, gait training using cane, progress knee mobility and quad strengthening in open chain, hip strengthening   Hildred Laser PT  04/17/23 10:42 AM

## 2023-04-18 ENCOUNTER — Ambulatory Visit: Payer: Federal, State, Local not specified - PPO

## 2023-04-19 DIAGNOSIS — R809 Proteinuria, unspecified: Secondary | ICD-10-CM | POA: Diagnosis not present

## 2023-04-19 DIAGNOSIS — E611 Iron deficiency: Secondary | ICD-10-CM | POA: Diagnosis not present

## 2023-04-19 DIAGNOSIS — R946 Abnormal results of thyroid function studies: Secondary | ICD-10-CM | POA: Diagnosis not present

## 2023-04-19 DIAGNOSIS — R197 Diarrhea, unspecified: Secondary | ICD-10-CM | POA: Diagnosis not present

## 2023-04-19 DIAGNOSIS — R0981 Nasal congestion: Secondary | ICD-10-CM | POA: Diagnosis not present

## 2023-04-19 DIAGNOSIS — R7989 Other specified abnormal findings of blood chemistry: Secondary | ICD-10-CM | POA: Diagnosis not present

## 2023-04-19 DIAGNOSIS — R509 Fever, unspecified: Secondary | ICD-10-CM | POA: Diagnosis not present

## 2023-04-19 DIAGNOSIS — R0600 Dyspnea, unspecified: Secondary | ICD-10-CM | POA: Diagnosis not present

## 2023-04-20 DIAGNOSIS — Z1211 Encounter for screening for malignant neoplasm of colon: Secondary | ICD-10-CM | POA: Diagnosis not present

## 2023-05-07 ENCOUNTER — Ambulatory Visit: Payer: Federal, State, Local not specified - PPO | Attending: General Surgery

## 2023-05-07 ENCOUNTER — Telehealth: Payer: Self-pay

## 2023-05-07 NOTE — Telephone Encounter (Signed)
 PT called and left voicemail regarding missed appointment. Left callback number if were to want to reschedule his last appointment.   Austin Ingram   05/07/23 1:36 PM

## 2023-05-08 DIAGNOSIS — H5310 Unspecified subjective visual disturbances: Secondary | ICD-10-CM | POA: Diagnosis not present

## 2023-05-14 ENCOUNTER — Other Ambulatory Visit: Payer: Self-pay | Admitting: Cardiovascular Disease

## 2023-05-15 ENCOUNTER — Other Ambulatory Visit: Payer: Self-pay | Admitting: Cardiovascular Disease

## 2023-05-16 ENCOUNTER — Other Ambulatory Visit: Payer: Self-pay

## 2023-05-16 DIAGNOSIS — Z131 Encounter for screening for diabetes mellitus: Secondary | ICD-10-CM | POA: Diagnosis not present

## 2023-05-16 DIAGNOSIS — I10 Essential (primary) hypertension: Secondary | ICD-10-CM | POA: Diagnosis not present

## 2023-05-16 DIAGNOSIS — Z0000 Encounter for general adult medical examination without abnormal findings: Secondary | ICD-10-CM | POA: Diagnosis not present

## 2023-05-16 DIAGNOSIS — I2510 Atherosclerotic heart disease of native coronary artery without angina pectoris: Secondary | ICD-10-CM | POA: Diagnosis not present

## 2023-05-16 DIAGNOSIS — F331 Major depressive disorder, recurrent, moderate: Secondary | ICD-10-CM | POA: Diagnosis not present

## 2023-05-16 DIAGNOSIS — Z125 Encounter for screening for malignant neoplasm of prostate: Secondary | ICD-10-CM | POA: Diagnosis not present

## 2023-05-16 DIAGNOSIS — E782 Mixed hyperlipidemia: Secondary | ICD-10-CM | POA: Diagnosis not present

## 2023-05-16 MED ORDER — EMPAGLIFLOZIN 10 MG PO TABS: 10.0000 mg | ORAL_TABLET | Freq: Every day | ORAL | 0 refills | Status: AC

## 2023-05-29 ENCOUNTER — Other Ambulatory Visit (HOSPITAL_COMMUNITY): Payer: Self-pay | Admitting: Family Medicine

## 2023-05-31 DIAGNOSIS — R0609 Other forms of dyspnea: Secondary | ICD-10-CM | POA: Diagnosis not present

## 2023-05-31 DIAGNOSIS — R0789 Other chest pain: Secondary | ICD-10-CM | POA: Diagnosis not present

## 2023-05-31 DIAGNOSIS — R6882 Decreased libido: Secondary | ICD-10-CM | POA: Diagnosis not present

## 2023-05-31 DIAGNOSIS — I2510 Atherosclerotic heart disease of native coronary artery without angina pectoris: Secondary | ICD-10-CM | POA: Diagnosis not present

## 2023-05-31 DIAGNOSIS — D509 Iron deficiency anemia, unspecified: Secondary | ICD-10-CM | POA: Diagnosis not present

## 2023-05-31 DIAGNOSIS — I255 Ischemic cardiomyopathy: Secondary | ICD-10-CM | POA: Diagnosis not present

## 2023-05-31 DIAGNOSIS — R001 Bradycardia, unspecified: Secondary | ICD-10-CM | POA: Diagnosis not present

## 2023-05-31 DIAGNOSIS — I252 Old myocardial infarction: Secondary | ICD-10-CM | POA: Diagnosis not present

## 2023-05-31 DIAGNOSIS — N529 Male erectile dysfunction, unspecified: Secondary | ICD-10-CM | POA: Diagnosis not present

## 2023-05-31 DIAGNOSIS — F32A Depression, unspecified: Secondary | ICD-10-CM | POA: Diagnosis not present

## 2023-06-08 DIAGNOSIS — S8782XA Crushing injury of left lower leg, initial encounter: Secondary | ICD-10-CM | POA: Diagnosis not present

## 2023-06-08 DIAGNOSIS — S8781XA Crushing injury of right lower leg, initial encounter: Secondary | ICD-10-CM | POA: Diagnosis not present

## 2023-06-08 DIAGNOSIS — M25561 Pain in right knee: Secondary | ICD-10-CM | POA: Diagnosis not present

## 2023-06-08 DIAGNOSIS — M25562 Pain in left knee: Secondary | ICD-10-CM | POA: Diagnosis not present

## 2023-06-10 ENCOUNTER — Other Ambulatory Visit: Payer: Self-pay | Admitting: Cardiovascular Disease

## 2023-06-16 IMAGING — CT CT ABD-PELV W/ CM
2 of 5 series · 16 of 46 positions shown, 18 images · IV contrast (omnipaque)
Comparison: 12/08/2002.

CLINICAL DATA: Diverticulitis, complication suspected.

EXAM:
CT ABDOMEN AND PELVIS WITH CONTRAST
TECHNIQUE: Multidetector CT imaging of the abdomen and pelvis was performed
using the standard protocol following bolus administration of
intravenous contrast.
CONTRAST:  80mL OMNIPAQUE IOHEXOL 350 MG/ML SOLN

[Series 2: axial st · axial · 0.85mm/px · z∈[-551,-171]mm · 13 of 88 slices shown, 15 images]
[im 6/88  soft-tissue]
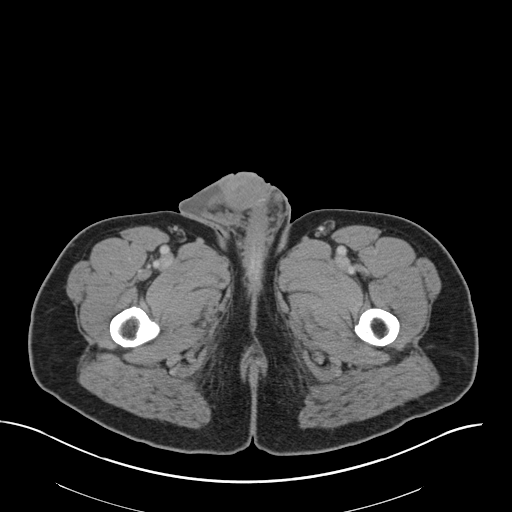
[im 6/88  bone]
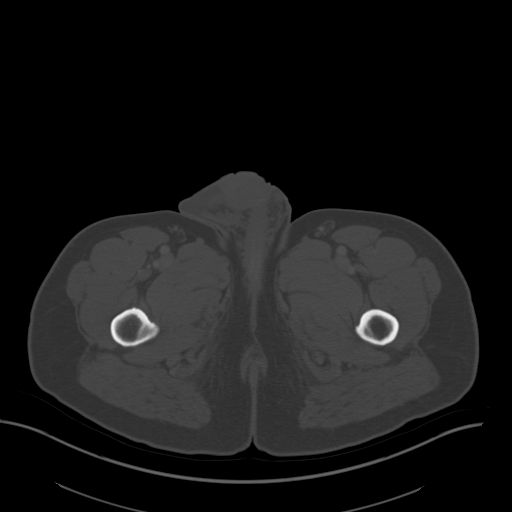
[im 12/88  soft-tissue]
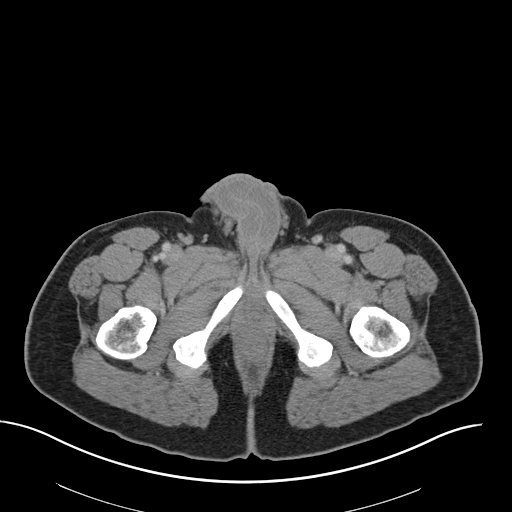
[im 18/88  soft-tissue]
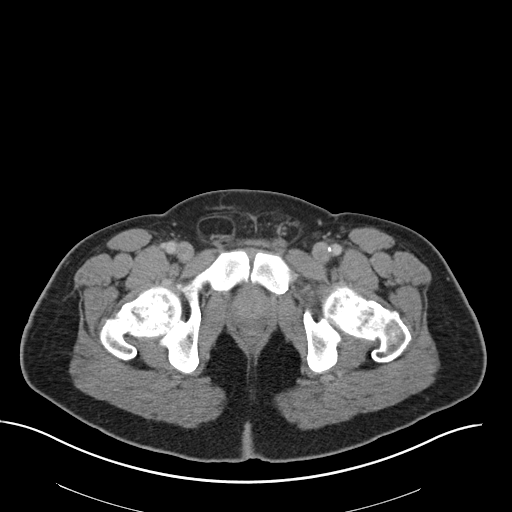
[im 24/88  soft-tissue]
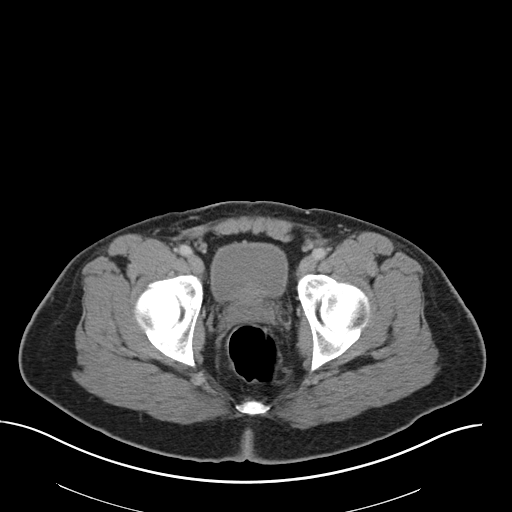
[im 30/88  soft-tissue]
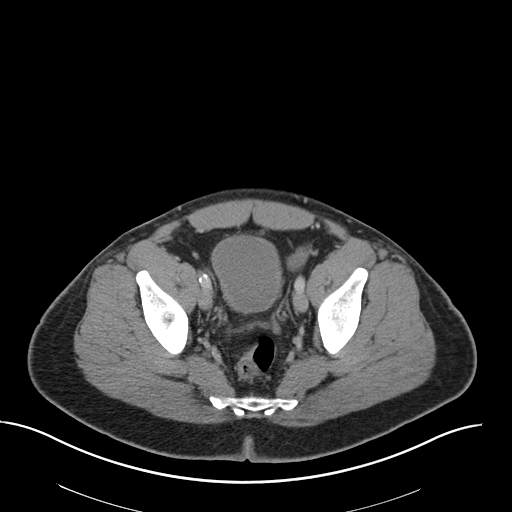
[im 35/88  soft-tissue]
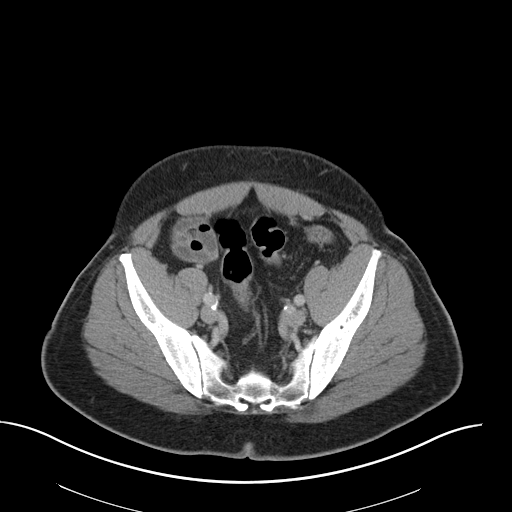
[im 47/88  soft-tissue]
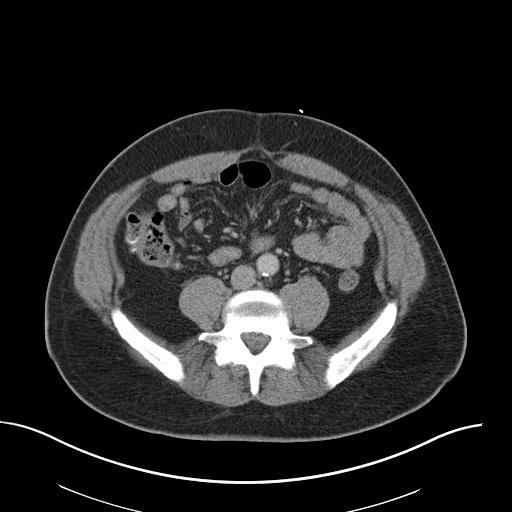
[im 53/88  soft-tissue]
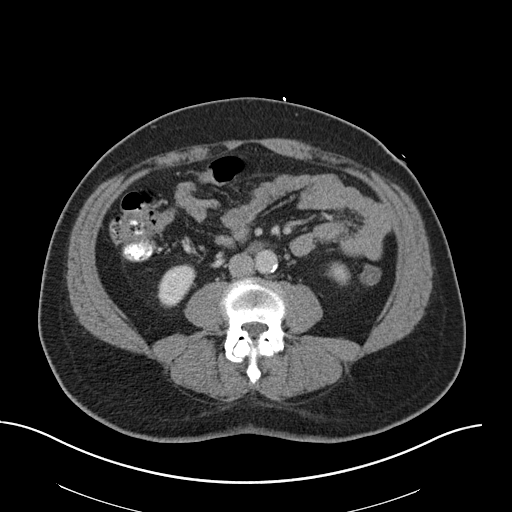
[im 59/88  soft-tissue]
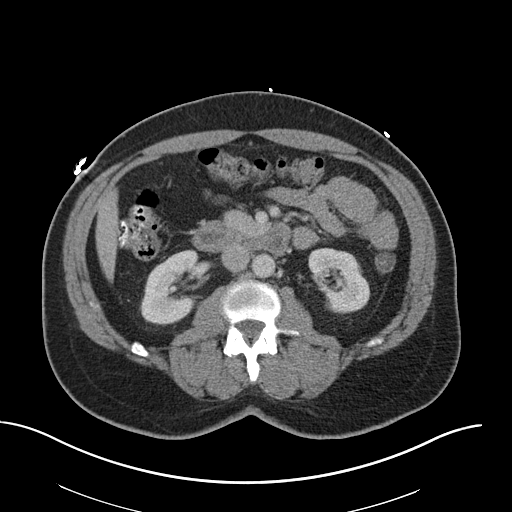
[im 59/88  bone]
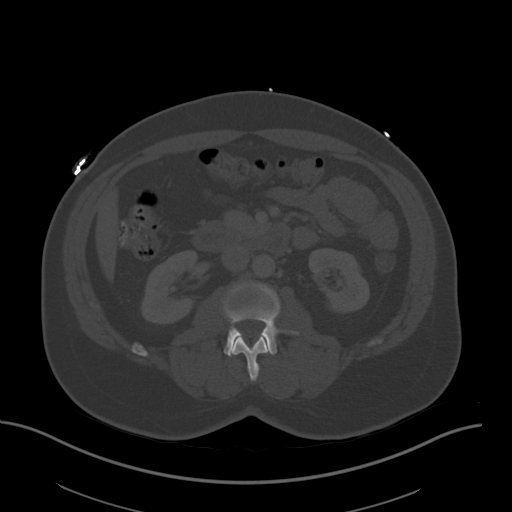
[im 64/88  soft-tissue]
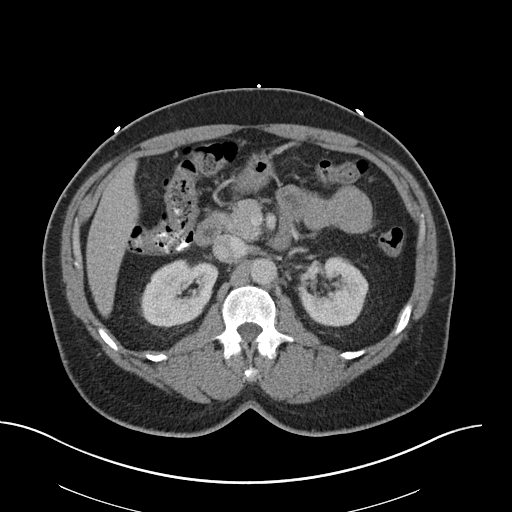
[im 70/88  soft-tissue]
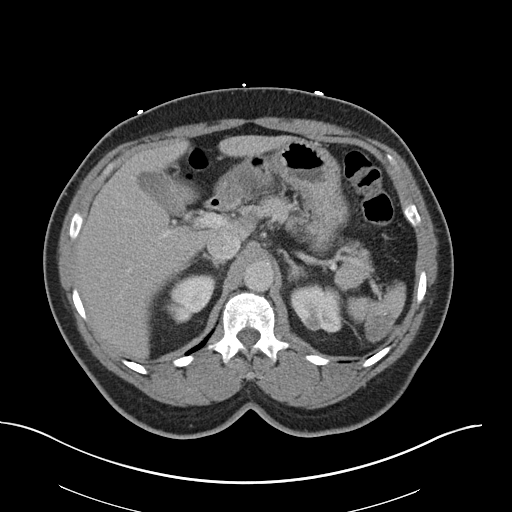
[im 76/88  soft-tissue]
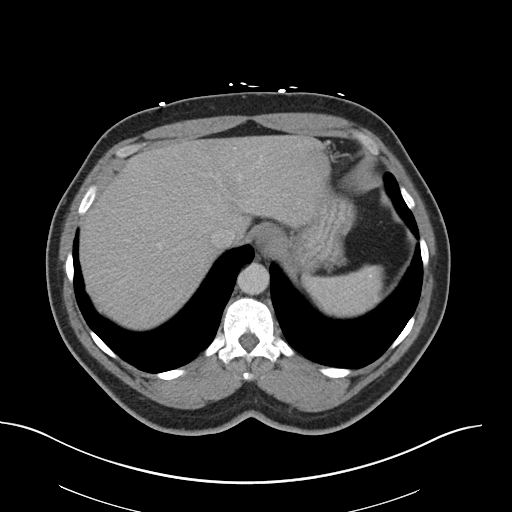
[im 82/88  soft-tissue]
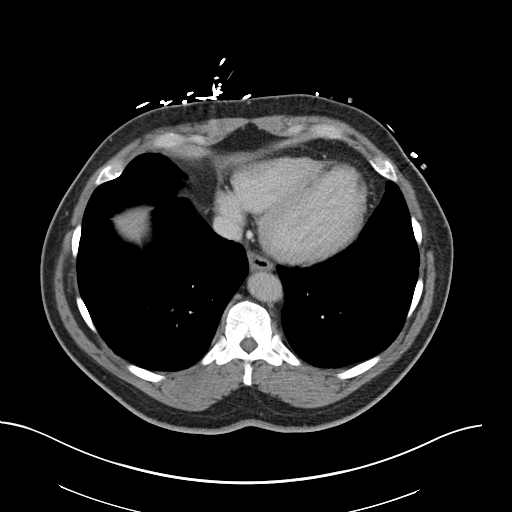

[Series 5: coronal st · coronal · 0.80mm/px · 3 of 164 slices shown]
[im 55/164  soft-tissue]
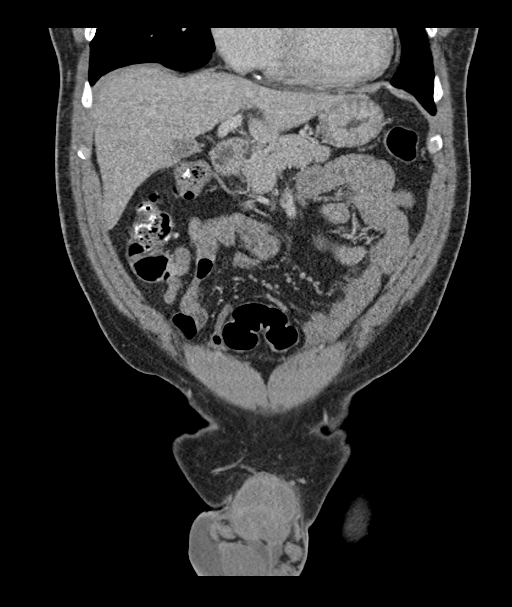
[im 73/164  soft-tissue]
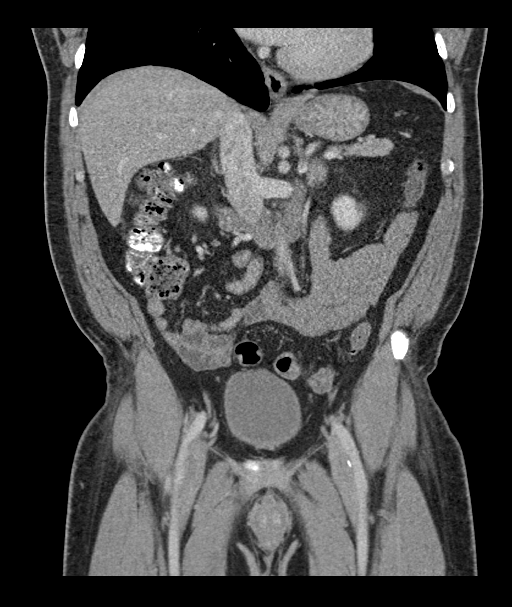
[im 91/164  soft-tissue]
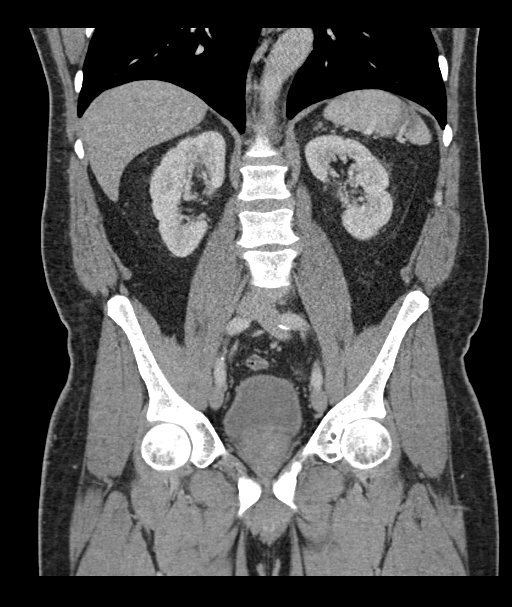

[16 of 46 positions shown; findings below may reference images not displayed]

FINDINGS: Lower chest: The heart is normal in size and coronary artery
calcifications are noted. Minimal atelectasis is present at the lung
bases.

Hepatobiliary: A subcentimeter hypodensity is present in the left
lobe of the liver which is too small to further characterize. No
biliary ductal dilatation. The gallbladder is without stones.

Pancreas: Unremarkable. No pancreatic ductal dilatation or
surrounding inflammatory changes.

Spleen: A wedge-shaped hypodensity is present in the posterior
aspect of the spleen suggesting splenic infarcts of indeterminate
age. Calcified granuloma are noted in the spleen.

Adrenals/Urinary Tract: No adrenal nodule or mass. No renal calculus
or hydronephrosis bilaterally. Hypodensities are present in the left
kidney which can not be further characterized on this exam, the
largest measuring 1.4 cm. The bladder is unremarkable.

Stomach/Bowel: The stomach is unremarkable. No bowel obstruction,
free air, or pneumatosis. A normal appendix is seen in the right
lower quadrant. A few scattered diverticula are noted along the
colon without evidence of diverticulitis. There is fatty
infiltration of the walls of the descending colon suggesting chronic
inflammatory changes.

Vascular/Lymphatic: Aortic atherosclerosis. No enlarged abdominal or
pelvic lymph nodes.

Reproductive: The prostate gland is normal in size.

Other: Fat containing inguinal hernias are present bilaterally.
There is a fat containing umbilical hernia. No free fluid.

Musculoskeletal: No acute osseous abnormality.
IMPRESSION: 1. Diverticulosis without diverticulitis.
2. Wedge shaped hypodensity in the posterior aspect of the spleen,
possible infarct of indeterminate age. Findings may also be
associated with laceration in the setting of trauma.
3. Coronary artery calcifications.
4. Indeterminate hypodensities in left kidney. Ultrasound is
recommended for further evaluation.
5. Remaining chronic findings as described above.

## 2023-08-08 DIAGNOSIS — I517 Cardiomegaly: Secondary | ICD-10-CM | POA: Diagnosis not present

## 2023-08-15 DIAGNOSIS — R202 Paresthesia of skin: Secondary | ICD-10-CM | POA: Diagnosis not present

## 2023-08-15 DIAGNOSIS — R001 Bradycardia, unspecified: Secondary | ICD-10-CM | POA: Diagnosis not present

## 2023-08-15 DIAGNOSIS — M79604 Pain in right leg: Secondary | ICD-10-CM | POA: Diagnosis not present

## 2023-08-15 DIAGNOSIS — R931 Abnormal findings on diagnostic imaging of heart and coronary circulation: Secondary | ICD-10-CM | POA: Diagnosis not present

## 2023-08-15 DIAGNOSIS — R9431 Abnormal electrocardiogram [ECG] [EKG]: Secondary | ICD-10-CM | POA: Diagnosis not present

## 2023-08-15 DIAGNOSIS — R079 Chest pain, unspecified: Secondary | ICD-10-CM | POA: Diagnosis not present

## 2023-08-24 DIAGNOSIS — Z7901 Long term (current) use of anticoagulants: Secondary | ICD-10-CM | POA: Diagnosis not present

## 2023-08-24 DIAGNOSIS — N529 Male erectile dysfunction, unspecified: Secondary | ICD-10-CM | POA: Diagnosis not present

## 2023-08-30 DIAGNOSIS — R9431 Abnormal electrocardiogram [ECG] [EKG]: Secondary | ICD-10-CM | POA: Diagnosis not present

## 2023-08-30 DIAGNOSIS — I10 Essential (primary) hypertension: Secondary | ICD-10-CM | POA: Diagnosis not present

## 2023-08-30 DIAGNOSIS — I498 Other specified cardiac arrhythmias: Secondary | ICD-10-CM | POA: Diagnosis not present

## 2023-08-30 DIAGNOSIS — M25561 Pain in right knee: Secondary | ICD-10-CM | POA: Diagnosis not present

## 2023-08-30 DIAGNOSIS — I252 Old myocardial infarction: Secondary | ICD-10-CM | POA: Diagnosis not present

## 2023-08-30 DIAGNOSIS — Z86718 Personal history of other venous thrombosis and embolism: Secondary | ICD-10-CM | POA: Diagnosis not present

## 2023-08-30 DIAGNOSIS — Z8673 Personal history of transient ischemic attack (TIA), and cerebral infarction without residual deficits: Secondary | ICD-10-CM | POA: Diagnosis not present

## 2023-09-13 DIAGNOSIS — R42 Dizziness and giddiness: Secondary | ICD-10-CM | POA: Diagnosis not present

## 2023-10-05 ENCOUNTER — Telehealth (HOSPITAL_COMMUNITY): Payer: Self-pay | Admitting: Cardiology

## 2024-01-23 DIAGNOSIS — S83271A Complex tear of lateral meniscus, current injury, right knee, initial encounter: Secondary | ICD-10-CM | POA: Diagnosis not present

## 2024-01-23 DIAGNOSIS — M1711 Unilateral primary osteoarthritis, right knee: Secondary | ICD-10-CM | POA: Diagnosis not present

## 2024-02-27 DIAGNOSIS — M170 Bilateral primary osteoarthritis of knee: Secondary | ICD-10-CM | POA: Diagnosis not present

## 2024-05-06 ENCOUNTER — Other Ambulatory Visit

## 2024-05-06 ENCOUNTER — Ambulatory Visit (INDEPENDENT_AMBULATORY_CARE_PROVIDER_SITE_OTHER): Admitting: Physician Assistant

## 2024-05-06 ENCOUNTER — Encounter: Payer: Self-pay | Admitting: Physician Assistant

## 2024-05-06 VITALS — BP 124/80 | HR 90 | Ht 69.0 in | Wt 194.0 lb

## 2024-05-06 DIAGNOSIS — Z7901 Long term (current) use of anticoagulants: Secondary | ICD-10-CM

## 2024-05-06 DIAGNOSIS — K644 Residual hemorrhoidal skin tags: Secondary | ICD-10-CM | POA: Diagnosis not present

## 2024-05-06 DIAGNOSIS — E876 Hypokalemia: Secondary | ICD-10-CM

## 2024-05-06 DIAGNOSIS — K625 Hemorrhage of anus and rectum: Secondary | ICD-10-CM | POA: Diagnosis not present

## 2024-05-06 DIAGNOSIS — D500 Iron deficiency anemia secondary to blood loss (chronic): Secondary | ICD-10-CM

## 2024-05-06 DIAGNOSIS — D649 Anemia, unspecified: Secondary | ICD-10-CM

## 2024-05-06 DIAGNOSIS — N189 Chronic kidney disease, unspecified: Secondary | ICD-10-CM

## 2024-05-06 DIAGNOSIS — I5042 Chronic combined systolic (congestive) and diastolic (congestive) heart failure: Secondary | ICD-10-CM | POA: Diagnosis not present

## 2024-05-06 DIAGNOSIS — I2510 Atherosclerotic heart disease of native coronary artery without angina pectoris: Secondary | ICD-10-CM

## 2024-05-06 LAB — CBC WITH DIFFERENTIAL/PLATELET
Basophils Absolute: 0.1 K/uL (ref 0.0–0.1)
Basophils Relative: 0.8 % (ref 0.0–3.0)
Eosinophils Absolute: 1 K/uL — ABNORMAL HIGH (ref 0.0–0.7)
Eosinophils Relative: 13 % — ABNORMAL HIGH (ref 0.0–5.0)
HCT: 43.7 % (ref 39.0–52.0)
Hemoglobin: 13.6 g/dL (ref 13.0–17.0)
Lymphocytes Relative: 20.4 % (ref 12.0–46.0)
Lymphs Abs: 1.6 K/uL (ref 0.7–4.0)
MCHC: 31.1 g/dL (ref 30.0–36.0)
MCV: 75 fl — ABNORMAL LOW (ref 78.0–100.0)
Monocytes Absolute: 0.5 K/uL (ref 0.1–1.0)
Monocytes Relative: 6.2 % (ref 3.0–12.0)
Neutro Abs: 4.6 K/uL (ref 1.4–7.7)
Neutrophils Relative %: 59.6 % (ref 43.0–77.0)
Platelets: 165 K/uL (ref 150.0–400.0)
RBC: 5.83 Mil/uL — ABNORMAL HIGH (ref 4.22–5.81)
RDW: 16.3 % — ABNORMAL HIGH (ref 11.5–15.5)
WBC: 7.6 K/uL (ref 4.0–10.5)

## 2024-05-06 LAB — BASIC METABOLIC PANEL WITH GFR
BUN: 25 mg/dL — ABNORMAL HIGH (ref 6–23)
CO2: 26 meq/L (ref 19–32)
Calcium: 8.9 mg/dL (ref 8.4–10.5)
Chloride: 108 meq/L (ref 96–112)
Creatinine, Ser: 1.49 mg/dL (ref 0.40–1.50)
GFR: 50.41 mL/min — ABNORMAL LOW
Glucose, Bld: 98 mg/dL (ref 70–99)
Potassium: 3.6 meq/L (ref 3.5–5.1)
Sodium: 142 meq/L (ref 135–145)

## 2024-05-06 LAB — B12 AND FOLATE PANEL
Folate: 10.8 ng/mL
Vitamin B-12: 490 pg/mL (ref 211–911)

## 2024-05-06 NOTE — Patient Instructions (Addendum)
 Your provider has requested that you go to the basement level for lab work before leaving today. Press B on the elevator. The lab is located at the first door on the left as you exit the elevator.  You have been scheduled for an Endoscopy and Colonoscopy. Please follow the written instructions given to you at your visit today.  If you use inhalers (even only as needed), please bring them with you on the day of your procedure.  DO NOT TAKE 7 DAYS PRIOR TO TEST- Trulicity (dulaglutide) Ozempic, Wegovy (semaglutide) Mounjaro (tirzepatide) Bydureon Bcise (exanatide extended release)  DO NOT TAKE 1 DAY PRIOR TO YOUR TEST Rybelsus (semaglutide) Adlyxin (lixisenatide) Victoza (liraglutide) Byetta (exanatide) ___________________________________________________________________________  Please follow up sooner if symptoms increase or worsen __________________________________________________________________________  Due to recent changes in healthcare laws, you may see the results of your imaging and laboratory studies on MyChart before your provider has had a chance to review them.  We understand that in some cases there may be results that are confusing or concerning to you. Not all laboratory results come back in the same time frame and the provider may be waiting for multiple results in order to interpret others.  Please give us  48 hours in order for your provider to thoroughly review all the results before contacting the office for clarification of your results.   Thank you for trusting me with your gastrointestinal care!   [name], [name] _______________________________________________________  If your blood pressure at your visit was 140/90 or greater, please contact your primary care physician to follow up on this.  _______________________________________________________  If you are age 35 or older, your body mass index should be between 23-30. Your Body mass index is 28.65 kg/m.  If this is out of the aforementioned range listed, please consider follow up with your Primary Care Provider.  If you are age 29 or younger, your body mass index should be between 19-25. Your Body mass index is 28.65 kg/m. If this is out of the aformentioned range listed, please consider follow up with your Primary Care Provider.   ________________________________________________________  The [facility] providers would like to encourage you to use MYCHART to communicate with providers for non-urgent requests or questions.  Due to long hold times on the telephone, sending your provider a message by [facility] may be a faster and more efficient way to get a response.  Please allow 48 business hours for a response.  Please remember that this is for non-urgent requests.  _______________________________________________________

## 2024-05-06 NOTE — Progress Notes (Signed)
 Agree with the need for EGD and colonoscopy in the hospital setting, however, he needs to be optimized from cardiac perspective prior to proceeding and we need to obtain cardiac clearance from his cardiologist in order to proceed. I see he is scheduled for March - that may be okay to give him time to improve from cardiac perspective. He requires hospital anesthesia to have his case done due to his cardiac history, however, due to significant backlog of patients to have procedures done in the hospital currently, I have no openings sooner than March at this point, will add to the wait list to schedule sooner if something arises in the interim.

## 2024-05-06 NOTE — Progress Notes (Signed)
 "     [name], [name] [address] [place], [place]  [address] Phone: [phone]   Gastroenterology Consultation  Referring Provider:     Silva Bernardino [name] Primary Care Physician:  Merilee, LITTIE.Addie, MD (Inactive) Primary Gastroenterologist:  [name], [name] / Elspeth Naval, MD  Reason for Consultation:     Rectal Bleeding, Anemia, CHF        HPI:   Discussed the use of AI scribe software for clinical note transcription with the patient, who gave verbal consent to proceed.  Austin Ingram. is a 64 year old male with congestive heart failure and anemia who presents for evaluation of rectal bleeding.  New patient.  He is referred from the [facility] in [place] to evaluate rectal bleeding.  He has never had a colonoscopy.  Patient states he been having problems with bright red rectal bleeding every other month for a couple of months.   04/04/24: CBC: Mild Anemia, Hgb 12.2g, MCV 75.  Mild low potassium 3.4.  BUN 25, Cr 1.48, GFR 53.  04/02/24:  Echocardiogram showed that LV systolic function is severely abnormal, LVEF is 15 to 20%.  He is followed by cardiologist Dr. Nickie through the [facility] in [place].  He was hospitalized 04/02/24 for acute on chronic CHF exascerbation at [facility].  History of CAD/cardiac stents, ischemic cardiomyopathy/chronic systolic CHF with baseline EF around 30 to 35%, LV thrombus, stroke, who normally follows with [facility] cardiology. He developed symptoms of nausea, vomiting and some diarrhea over the Thanksgiving weekend. He was unable to take his cardiac medications because of GI symptoms. Eventually his GI symptoms improved, however he noticed progressive shortness of breath especially with walking. Came to the ER for evaluation. His blood pressure was somewhat elevated in the ED. He was not hypoxic, chest x-ray did not reveal any significant pulmonary edema, however his proBNP was 16,000. Troponins were 102 and an hour later it was 96.    History of Present Illness Rectal bleeding - Intermittent episodes over the past 3-4 months - Bright red blood observed on tissue and in the toilet - Bleeding episodes last 2-3 days and then resolve, with a cyclical pattern - No associated rectal pain, straining with bowel movements, or known external hemorrhoids - Possible internal hemorrhoids suggested by prior physician - No prior colonoscopy - No family history of colon cancer  Bowel habits - Generally regular bowel movements - Periods of frequent bowel movements followed by a few days without a movement - Rectal bleeding tends to occur during periods without bowel movements - No constipation or diarrhea  Anemia - History of low hemoglobin - Required blood transfusion during hospitalization several months ago - Currently takes oral iron pill once daily on Monday, Wednesday, and Friday - Also takes vitamin C - No vitamin B12 supplementation  Congestive heart failure and anticoagulation - Hospitalized on December 5th for exacerbation of congestive heart failure - Ejection fraction 15-20% - Takes Eliquis  as part of medication regimen     Past Medical History:  Diagnosis Date   Allergic rhinitis    Anxiety    Arthritis    lower back, right thumb, knees (10/04/2012)   Asthma    grew out of it (10/04/2012)   CKD (chronic kidney disease), stage II    Coronary artery disease    a. s/p prior stent to LAD;  b. LHC 6/14: DES to pRCA. c. DES to dCx 01/2017 with residual disease.   Depression  Hyperlipidemia    Hypertension    Ischemic cardiomyopathy    LV (left ventricular) mural thrombus    MI (myocardial infarction) (HCC) 03/2007   OSA (obstructive sleep apnea)    mild   Pneumonia    as a child (10/04/2012)   Sinus bradycardia    Splenic infarct     Past Surgical History:  Procedure Laterality Date   ARTHROPLASTY Right 1993   'crushed; removed bone fragments (10/04/2012)   CARDIAC CATHETERIZATION  2010    CORONARY ANGIOPLASTY WITH STENT PLACEMENT  2008; 10/04/2012   1 + 1 (10/04/2012)   CORONARY PRESSURE/FFR STUDY N/A 02/26/2017   Procedure: INTRAVASCULAR PRESSURE WIRE/FFR STUDY;  Surgeon: Verlin Lonni BIRCH, MD;  Location: [facility] INVASIVE CV LAB;  Service: Cardiovascular;  Laterality: N/A;   CORONARY STENT INTERVENTION N/A 02/26/2017   Procedure: CORONARY STENT INTERVENTION;  Surgeon: Verlin Lonni BIRCH, MD;  Location: [facility] INVASIVE CV LAB;  Service: Cardiovascular;  Laterality: N/A;   EXPLORATORY LAPAROTOMY  1990's?   went in to repair hernia; found fatty tissue instead; no hernia repair (10/04/2012)   LEFT HEART CATH AND CORONARY ANGIOGRAPHY N/A 02/26/2017   Procedure: LEFT HEART CATH AND CORONARY ANGIOGRAPHY;  Surgeon: Verlin Lonni BIRCH, MD;  Location: [facility] INVASIVE CV LAB;  Service: Cardiovascular;  Laterality: N/A;   LEFT HEART CATH AND CORONARY ANGIOGRAPHY N/A 03/29/2021   Procedure: LEFT HEART CATH AND CORONARY ANGIOGRAPHY;  Surgeon: Jordan, Peter M, MD;  Location: [facility] INVASIVE CV LAB;  Service: Cardiovascular;  Laterality: N/A;   LEFT HEART CATHETERIZATION WITH CORONARY ANGIOGRAM N/A 10/04/2012   Procedure: LEFT HEART CATHETERIZATION WITH CORONARY ANGIOGRAM;  Surgeon: Ozell Fell, MD;  Location: [facility] CATH LAB;  Service: Cardiovascular;  Laterality: N/A;    Prior to Admission medications  Medication Sig Start Date End Date Taking? Authorizing Provider  acetaminophen  (TYLENOL ) 500 MG tablet Take 2 tablets (1,000 mg total) by mouth every 6 (six) hours as needed. 01/29/23   Tammy Sor, [name]  apixaban  (ELIQUIS ) 5 MG TABS tablet Take 1 tablet (5 mg total) by mouth 2 (two) times daily. 11/16/22   Dunn, Dayna N, [name]  atorvastatin  (LIPITOR) 40 MG tablet TAKE 1 TABLET BY MOUTH EVERY DAY 05/29/23   Rolan Ezra RAMAN, MD  atorvastatin  (LIPITOR) 80 MG tablet Take 1 tablet (80 mg total) by mouth daily. 01/24/23   Rolan Ezra RAMAN, MD  azelastine (ASTELIN) 0.1 % nasal spray Place 1 spray into both  nostrils as needed for allergies.    [provider]  Blood Pressure Monitoring (BLOOD PRESSURE CUFF) MISC Use to monitor blood pressure daily 03/26/23   Glena Harlene HERO, FNP  carvedilol  (COREG ) 3.125 MG tablet Take 1 tablet (3.125 mg total) by mouth 2 (two) times daily with a meal. 03/26/23   Milford, Harlene HERO, FNP  citalopram  (CELEXA ) 40 MG tablet Take 40 mg by mouth daily.    [provider]  diclofenac Sodium (VOLTAREN) 1 % GEL Apply 1 Application topically 2 (two) times daily as needed (pain).    [provider]  empagliflozin  (JARDIANCE ) 10 MG TABS tablet Take 1 tablet (10 mg total) by mouth daily before breakfast. Patient needs to make an Appointment 05/16/23   Verlin Lonni BIRCH, MD  etodolac (LODINE) 400 MG tablet Take 400 mg by mouth 2 (two) times daily as needed for mild pain or moderate pain.    [provider]  finasteride  (PROSCAR ) 5 MG tablet Take 5 mg by mouth daily. 02/13/17   [provider]  fluticasone (  FLONASE) 50 MCG/ACT nasal spray Place 1-2 sprays into both nostrils daily as needed for allergies.    [provider]  hydrALAZINE  (APRESOLINE ) 50 MG tablet Take 1 tablet (50 mg total) by mouth 3 (three) times daily. 03/06/23 06/04/23  Rolan Ezra RAMAN, MD  isosorbide  mononitrate (IMDUR ) 60 MG 24 hr tablet Take 1.5 tablets (90 mg total) by mouth daily. 03/06/23   Rolan Ezra RAMAN, MD  methocarbamol  (ROBAXIN ) 500 MG tablet Take 1 tablet (500 mg total) by mouth every 8 (eight) hours as needed for muscle spasms. 01/29/23   Tammy Sor, [name]  nitroGLYCERIN  (NITROSTAT ) 0.4 MG SL tablet Place 1 tablet under tongue as needed for chest pain. 09/15/22   Colletta Manuelita Garre, [name]  sacubitril -valsartan  (ENTRESTO ) 49-51 MG Take 1 tablet by mouth 2 (two) times daily. 03/06/23   Rolan Ezra RAMAN, MD  tamsulosin  (FLOMAX ) 0.4 MG CAPS capsule Take 0.4 mg by mouth daily with breakfast.  01/17/17   [provider]  traMADol  (ULTRAM )  50 MG tablet Take 1-2 tablets (50-100 mg total) by mouth every 6 (six) hours as needed for moderate pain or severe pain. 01/29/23   Tammy Sor, [name]    Family History  Problem Relation Age of Onset   Atrial fibrillation Mother        irregular heart beats   Hypertension Mother    Diabetes type II Mother    CAD Maternal Grandfather    CAD Maternal Grandmother      Social History[1]  Allergies as of 05/06/2024 - Review Complete 05/06/2024  Allergen Reaction Noted   Adhesive [tape] Other (See Comments) 02/20/2017   Hydrocodone  Nausea And Vomiting 02/13/2023    Review of Systems:    All systems reviewed and negative except where noted in HPI.   Physical Exam:  BP 124/80   Pulse 90   Ht 5' 9 (1.753 m)   Wt 194 lb (88 kg)   BMI 28.65 kg/m  No LMP for male patient.  General:   Alert,  Well-developed, well-nourished, pleasant and cooperative in NAD Lungs:  Respirations even and unlabored.  Clear throughout to auscultation.   No wheezes, crackles, or rhonchi. No acute distress. Heart:  Regular rate and rhythm; no murmurs, clicks, rubs, or gallops. Abdomen:  Normal bowel sounds.  No bruits.  Soft, and non-distended without masses, hepatosplenomegaly or hernias noted.  No Tenderness.  No guarding or rebound tenderness.    Neurologic:  Alert and oriented x3;  grossly normal neurologically. Psych:  Alert and cooperative. Normal mood and affect. Rectal: Moderate external hemorrhoids which are nonthrombosed and nontender.  No current bleeding.  No anal fissure.  Tight anal sphincter tone.  No internal rectal masses or tenderness.  Stool is brown and Hemoccult negative.  Chaperone for Exam:  Alethea Blocker, CMA    Imaging Studies: No results found.  Labs: CBC  Component Ref Range & Units (hover) 1 yr ago (02/13/23) 1 yr ago (01/29/23) 1 yr ago (01/28/23) 1 yr ago (01/28/23) 1 yr ago (01/27/23) 1 yr ago (01/26/23) 1 yr ago (01/20/23)  WBC 6.7 6.1 7.0 7.9 6.2 7.4   RBC 4.97  3.39 Low  3.41 Low  3.41 Low  2.63 Low  3.12 Low    Hemoglobin 12.0 Low  8.5 Low  8.6 Low  8.5 Low  CM 6.2 Low Panic  CM 7.5 Low  9.9 Low   HCT 39.6 27.3 Low  27.8 Low  27.9 Low  21.0 Low  24.7 Low  29.0 Low  MCV 79.7 Low  80.5 81.5 81.8 79.8 Low  79.2 Low    MCH 24.1 Low  25.1 Low  25.2 Low  24.9 Low  23.6 Low  24.0 Low    MCHC 30.3 31.1 30.9 30.5 29.5 Low  30.4   RDW 16.8 High  18.4 High  17.7 High  17.3 High  16.6 High  15.9 High    Platelets 347 285 285 261 257 261       Component Value Date/Time   WBC 6.7 02/13/2023 1251   RBC 4.97 02/13/2023 1251   HGB 12.0 (L) 02/13/2023 1251   HGB 13.3 08/12/2020 1028   HCT 39.6 02/13/2023 1251   HCT 43.2 08/12/2020 1028   PLT 347 02/13/2023 1251   PLT 269 08/12/2020 1028   MCV 79.7 (L) 02/13/2023 1251   MCV 73 (L) 08/12/2020 1028    CMP   Component Ref Range & Units (hover) 1 yr ago (02/13/23) 1 yr ago (01/28/23) 1 yr ago (01/27/23) 1 yr ago (01/26/23) 1 yr ago (01/20/23) 1 yr ago (01/20/23) 1 yr ago (09/05/22)  Sodium 139 137 140 140 150 High  143 142 R  Potassium 3.5 3.7 3.3 Low  3.0 Low  3.6 3.4 Low  3.7 R  Chloride 107 105 106 106 118 High  108 105 R  CO2 25 26 29 25  23 26  R  Glucose, Bld 95 99 CM 121 High  CM 126 High  CM 69 Low  CM 108 High  CM 87  Comment: Glucose reference range applies only to samples taken after fasting for at least 8 hours.  BUN 10 14 13 14 17 17 15  R  Creatinine, Ser 1.00 1.09 1.10 1.13 0.90 1.41 High  1.24 R  Calcium  9.4 8.3 Low  8.1 Low  8.5 Low   8.8 Low  9.0 R  GFR, Estimated >60 >60 CM >60 CM >60 CM  57 Low  CM       Component Value Date/Time   NA 139 02/13/2023 1251   NA 142 09/05/2022 0951   K 3.5 02/13/2023 1251   CL 107 02/13/2023 1251   CO2 25 02/13/2023 1251   GLUCOSE 95 02/13/2023 1251   BUN 10 02/13/2023 1251   BUN 15 09/05/2022 0951   CREATININE 1.00 02/13/2023 1251   CALCIUM  9.4 02/13/2023 1251   PROT 6.5 01/20/2023 1522   PROT 6.2 09/05/2022 0951   ALBUMIN 3.7 01/20/2023 1522    ALBUMIN 4.1 09/05/2022 0951   AST 33 01/20/2023 1522   ALT 20 01/20/2023 1522   ALKPHOS 70 01/20/2023 1522   BILITOT 0.9 01/20/2023 1522   BILITOT 0.3 09/05/2022 0951   GFRNONAA >60 02/13/2023 1251   GFRAA 72 04/10/2019 1538    Assessment and Plan:   Austin Malachowski Ingram. is a 64 y.o. y/o male has been referred for: Assessment & Plan Rectal bleeding / External hemorrhoids Possibly due to hemorrhoids.  There are large external hemorrhoids on exam today.  Hemoccult negative.  Cannot exclude colon neoplasm.  He has never had a colonoscopy.  He is on Eliquis .  He is at increased procedural risks due to cardiac comorbidities. - Scheduling Colonoscopy I discussed risks of colonoscopy with patient to include risk of bleeding, colon perforation, and risk of sedation.  Patient expressed understanding and agrees to proceed with colonoscopy.  She - Advised colonoscopy be performed in the hospital setting due to cardiac comorbidities for increased safety and support. - We discussed treatment for hemorrhoids.  OTC Preparation H, witch hazel wipes, Tucks pads. - ED precautions were discussed if he has recurrent worsening severe bleeding or anemia.  2.  Anemia Chronic iron deficiency anemia likely secondary to gastrointestinal blood loss, requiring evaluation for GI sources. -Scheduling EGD and colonoscopy - Recommended upper endoscopy concurrent with colonoscopy to evaluate for upper GI sources of bleeding. - Labs: CBC, iron panel, ferritin, B12, folate, celiac screen -He is currently taking iron tablet 3 days/week with vitamin C. - If iron studies are low, then recommend take iron tablet every day.  Also consider IV iron infusion if needed.  3.  Chronic combined systolic and diastolic heart failure; most recent LVEF 15 to 20%. Severe heart failure with reduced ejection fraction and recent exacerbation, increasing procedural and sedation risk. - Planned to schedule colonoscopy and upper endoscopy in  the hospital setting for enhanced monitoring and support. - Planned to communicate with his cardiologist to confirm safety of sedation for procedures. - Request cardiac clearance and permission to hold Eliquis  2 days prior to procedures from his cardiologist at the [facility] in [place].  4.  Long-term anticoagulation therapy Ongoing Eliquis  therapy increases bleeding risk during invasive procedures; requires coordination with cardiology. - Planned to contact his cardiologist to obtain permission to hold Eliquis  for two days prior to procedures and resume immediately after.  5.  Hypokalemia and CKD - Lab BMP   Follow up 4 weeks after EGD and colonoscopy procedures.  [name], [name]       [1]  Social History Tobacco Use   Smoking status: Former    Current packs/day: 0.00    Average packs/day: 0.5 packs/day for 20.0 years (10.0 ttl pk-yrs)    Types: Cigarettes    Start date: 12/31/1978    Quit date: 12/31/1998    Years since quitting: 25.3   Smokeless tobacco: Never  Vaping Use   Vaping status: Never Used  Substance Use Topics   Alcohol use: Yes    Comment: 10/04/2012 1 beer plus 2 mixed drinks once/month   Drug use: Yes    Frequency: 14.0 times per week    Types: Marijuana    Comment: smokes 2 joints/day   "

## 2024-05-07 LAB — IRON,TIBC AND FERRITIN PANEL
%SAT: 16 % — ABNORMAL LOW (ref 20–48)
Ferritin: 59 ng/mL (ref 24–380)
Iron: 60 ug/dL (ref 50–180)
TIBC: 368 ug/dL (ref 250–425)

## 2024-05-08 ENCOUNTER — Ambulatory Visit: Payer: Self-pay | Admitting: Physician Assistant

## 2024-05-08 LAB — CELIAC DISEASE AB SCREEN W/RFX
Deamidated Gliadin Abs, IgA: 8 U (ref 0–19)
Immunoglobulin A, (IgA) QN, Serum: 168 mg/dL (ref 61–437)
t-Transglutaminase (tTG) IgA: <2 U/mL (ref 0–3)

## 2024-05-09 ENCOUNTER — Telehealth: Payer: Self-pay | Admitting: Physician Assistant

## 2024-05-09 NOTE — Telephone Encounter (Signed)
 Inbound call from [place] from [facility] regarding fax received for cardiology clearance for patient. States they are unable to give clearance due to patient's condition. Requesting a call at [phone] to discuss further details. Please advise, thank you

## 2024-05-12 NOTE — Telephone Encounter (Signed)
 I spoke with Dr. Lucienne and he stated that he thinks it would be best for the patient to stay on his Eliquis  and to hold off on procedures for at least 3 months. Patient was recently hospitalized for Heart Failure which makes him high risk for any procedure. He has a follow up in April with Cardiology.

## 2024-05-12 NOTE — Telephone Encounter (Signed)
 FYI-all-this patient needs to come off the hospital schedule in March, he does not have cardiac clearance nor will he have it for a few months at least.  I will put him back on the wait list.  Thanks

## 2024-05-13 ENCOUNTER — Ambulatory Visit: Admitting: Cardiovascular Disease

## 2024-05-13 NOTE — Telephone Encounter (Signed)
 Left a message for patient to return the call.

## 2024-05-13 NOTE — Telephone Encounter (Signed)
 Spoke with patient regarding [name]'s note below. Appointment scheduled for Thu, 07/03/24 at 3:20 PM. Patient request call back in about 1.5 hours to be able to write this appointment down. Will call back around 2:30 PM. Noted - additional appointment in for March. See which appointment patient would rather have with call back.  [name], [name] to Austin Ingram HERO, CMA  Armbruster, Elspeth SQUIBB, MD  [facility] Triage     05/13/24 12:09 PM Please schedule patient follow-up office visit in March with Dr. Leigh for iron deficiency anemia and to discuss re-scheduling procedures. Thank you, [place], [name]

## 2024-05-15 NOTE — Telephone Encounter (Signed)
 Left message for patient to return the call.  Additional appointment in March canceled.

## 2024-07-03 ENCOUNTER — Ambulatory Visit: Admitting: Gastroenterology

## 2024-07-07 ENCOUNTER — Ambulatory Visit (HOSPITAL_COMMUNITY): Admit: 2024-07-07 | Admitting: Gastroenterology

## 2024-07-07 ENCOUNTER — Encounter (HOSPITAL_COMMUNITY): Payer: Self-pay

## 2024-07-07 SURGERY — EGD (ESOPHAGOGASTRODUODENOSCOPY)
Anesthesia: Monitor Anesthesia Care

## 2024-07-22 ENCOUNTER — Ambulatory Visit: Admitting: Gastroenterology
